# Patient Record
Sex: Female | Born: 1955
Health system: Southern US, Community
[De-identification: ages and names within clinical notes are randomized; demographics above are authoritative.]

## PROBLEM LIST (undated history)

## (undated) DIAGNOSIS — C73 Malignant neoplasm of thyroid gland: Secondary | ICD-10-CM

## (undated) DIAGNOSIS — I499 Cardiac arrhythmia, unspecified: Secondary | ICD-10-CM

## (undated) DIAGNOSIS — M40204 Unspecified kyphosis, thoracic region: Secondary | ICD-10-CM

## (undated) DIAGNOSIS — N289 Disorder of kidney and ureter, unspecified: Secondary | ICD-10-CM

## (undated) DIAGNOSIS — T7840XA Allergy, unspecified, initial encounter: Secondary | ICD-10-CM

## (undated) DIAGNOSIS — F341 Dysthymic disorder: Secondary | ICD-10-CM

## (undated) DIAGNOSIS — N1831 Chronic kidney disease, stage 3a: Secondary | ICD-10-CM

## (undated) DIAGNOSIS — R0600 Dyspnea, unspecified: Secondary | ICD-10-CM

## (undated) DIAGNOSIS — F32A Depression, unspecified: Secondary | ICD-10-CM

## (undated) DIAGNOSIS — R011 Cardiac murmur, unspecified: Secondary | ICD-10-CM

## (undated) DIAGNOSIS — J449 Chronic obstructive pulmonary disease, unspecified: Secondary | ICD-10-CM

## (undated) DIAGNOSIS — R112 Nausea with vomiting, unspecified: Secondary | ICD-10-CM

## (undated) DIAGNOSIS — E890 Postprocedural hypothyroidism: Secondary | ICD-10-CM

## (undated) DIAGNOSIS — I639 Cerebral infarction, unspecified: Secondary | ICD-10-CM

## (undated) DIAGNOSIS — M5135 Other intervertebral disc degeneration, thoracolumbar region: Secondary | ICD-10-CM

## (undated) DIAGNOSIS — J189 Pneumonia, unspecified organism: Secondary | ICD-10-CM

## (undated) DIAGNOSIS — D649 Anemia, unspecified: Secondary | ICD-10-CM

## (undated) DIAGNOSIS — R413 Other amnesia: Secondary | ICD-10-CM

## (undated) DIAGNOSIS — G4733 Obstructive sleep apnea (adult) (pediatric): Secondary | ICD-10-CM

## (undated) DIAGNOSIS — K219 Gastro-esophageal reflux disease without esophagitis: Secondary | ICD-10-CM

## (undated) DIAGNOSIS — I10 Essential (primary) hypertension: Secondary | ICD-10-CM

## (undated) DIAGNOSIS — E785 Hyperlipidemia, unspecified: Secondary | ICD-10-CM

## (undated) DIAGNOSIS — I480 Paroxysmal atrial fibrillation: Secondary | ICD-10-CM

## (undated) DIAGNOSIS — I700 Atherosclerosis of aorta: Secondary | ICD-10-CM

## (undated) DIAGNOSIS — R7303 Prediabetes: Secondary | ICD-10-CM

## (undated) DIAGNOSIS — G4730 Sleep apnea, unspecified: Secondary | ICD-10-CM

## (undated) DIAGNOSIS — F329 Major depressive disorder, single episode, unspecified: Secondary | ICD-10-CM

## (undated) DIAGNOSIS — Z9889 Other specified postprocedural states: Secondary | ICD-10-CM

## (undated) DIAGNOSIS — I4891 Unspecified atrial fibrillation: Secondary | ICD-10-CM

## (undated) DIAGNOSIS — G935 Compression of brain: Secondary | ICD-10-CM

## (undated) DIAGNOSIS — E209 Hypoparathyroidism, unspecified: Secondary | ICD-10-CM

## (undated) DIAGNOSIS — I2510 Atherosclerotic heart disease of native coronary artery without angina pectoris: Secondary | ICD-10-CM

## (undated) DIAGNOSIS — E079 Disorder of thyroid, unspecified: Secondary | ICD-10-CM

## (undated) DIAGNOSIS — Z7901 Long term (current) use of anticoagulants: Secondary | ICD-10-CM

## (undated) DIAGNOSIS — M8580 Other specified disorders of bone density and structure, unspecified site: Secondary | ICD-10-CM

## (undated) DIAGNOSIS — Z79899 Other long term (current) drug therapy: Secondary | ICD-10-CM

## (undated) DIAGNOSIS — Z9841 Cataract extraction status, right eye: Secondary | ICD-10-CM

## (undated) DIAGNOSIS — I6789 Other cerebrovascular disease: Secondary | ICD-10-CM

## (undated) DIAGNOSIS — I779 Disorder of arteries and arterioles, unspecified: Secondary | ICD-10-CM

## (undated) DIAGNOSIS — I5189 Other ill-defined heart diseases: Secondary | ICD-10-CM

## (undated) DIAGNOSIS — J45909 Unspecified asthma, uncomplicated: Secondary | ICD-10-CM

## (undated) DIAGNOSIS — Z9842 Cataract extraction status, left eye: Secondary | ICD-10-CM

## (undated) DIAGNOSIS — E039 Hypothyroidism, unspecified: Secondary | ICD-10-CM

## (undated) HISTORY — PX: OTHER SURGICAL HISTORY: SHX169

## (undated) HISTORY — DX: Hyperlipidemia, unspecified: E78.5

## (undated) HISTORY — DX: Cerebral infarction, unspecified: I63.9

## (undated) HISTORY — PX: JOINT REPLACEMENT: SHX530

## (undated) HISTORY — DX: Gastro-esophageal reflux disease without esophagitis: K21.9

## (undated) HISTORY — DX: Major depressive disorder, single episode, unspecified: F32.9

## (undated) HISTORY — PX: TONSILLECTOMY: SUR1361

## (undated) HISTORY — DX: Dysthymic disorder: F34.1

## (undated) HISTORY — PX: CATARACT EXTRACTION W/ INTRAOCULAR LENS  IMPLANT, BILATERAL: SHX1307

## (undated) HISTORY — DX: Disorder of thyroid, unspecified: E07.9

## (undated) HISTORY — DX: Hypoparathyroidism, unspecified: E20.9

## (undated) HISTORY — DX: Depression, unspecified: F32.A

## (undated) HISTORY — PX: THYROIDECTOMY: SHX17

## (undated) HISTORY — DX: Essential (primary) hypertension: I10

## (undated) HISTORY — DX: Allergy, unspecified, initial encounter: T78.40XA

## (undated) HISTORY — DX: Malignant neoplasm of thyroid gland: C73

## (undated) HISTORY — PX: CATARACT EXTRACTION, BILATERAL: SHX1313

---

## 1997-05-21 DIAGNOSIS — C73 Malignant neoplasm of thyroid gland: Secondary | ICD-10-CM

## 1997-05-21 HISTORY — DX: Malignant neoplasm of thyroid gland: C73

## 1998-05-21 HISTORY — PX: TOTAL THYROIDECTOMY: SHX2547

## 1999-05-22 DIAGNOSIS — I639 Cerebral infarction, unspecified: Secondary | ICD-10-CM

## 1999-05-22 HISTORY — DX: Cerebral infarction, unspecified: I63.9

## 1999-12-20 HISTORY — PX: OTHER SURGICAL HISTORY: SHX169

## 2000-01-19 ENCOUNTER — Encounter: Payer: Self-pay | Admitting: Family Medicine

## 2000-01-19 ENCOUNTER — Encounter: Admission: RE | Admit: 2000-01-19 | Discharge: 2000-01-19 | Payer: Self-pay | Admitting: Family Medicine

## 2000-03-21 DIAGNOSIS — I6381 Other cerebral infarction due to occlusion or stenosis of small artery: Secondary | ICD-10-CM

## 2000-03-21 HISTORY — DX: Other cerebral infarction due to occlusion or stenosis of small artery: I63.81

## 2001-09-09 ENCOUNTER — Other Ambulatory Visit: Admission: RE | Admit: 2001-09-09 | Discharge: 2001-09-09 | Payer: Self-pay | Admitting: Family Medicine

## 2002-11-18 ENCOUNTER — Encounter: Admission: RE | Admit: 2002-11-18 | Discharge: 2002-11-18 | Payer: Self-pay | Admitting: Family Medicine

## 2002-11-18 ENCOUNTER — Encounter: Payer: Self-pay | Admitting: Family Medicine

## 2004-01-04 ENCOUNTER — Other Ambulatory Visit: Payer: Self-pay

## 2004-06-14 ENCOUNTER — Ambulatory Visit: Payer: Self-pay | Admitting: Family Medicine

## 2004-06-16 ENCOUNTER — Ambulatory Visit: Payer: Self-pay | Admitting: Family Medicine

## 2004-07-14 ENCOUNTER — Ambulatory Visit: Payer: Self-pay | Admitting: Family Medicine

## 2004-08-15 ENCOUNTER — Ambulatory Visit: Payer: Self-pay | Admitting: Family Medicine

## 2004-09-06 ENCOUNTER — Ambulatory Visit: Payer: Self-pay | Admitting: Family Medicine

## 2005-07-25 ENCOUNTER — Ambulatory Visit: Payer: Self-pay | Admitting: Family Medicine

## 2005-08-13 ENCOUNTER — Ambulatory Visit: Payer: Self-pay | Admitting: Family Medicine

## 2005-09-11 ENCOUNTER — Ambulatory Visit: Payer: Self-pay | Admitting: Family Medicine

## 2005-09-14 ENCOUNTER — Ambulatory Visit: Payer: Self-pay | Admitting: Family Medicine

## 2005-10-18 ENCOUNTER — Ambulatory Visit: Payer: Self-pay | Admitting: Family Medicine

## 2006-01-16 ENCOUNTER — Ambulatory Visit: Payer: Self-pay | Admitting: Family Medicine

## 2006-02-11 ENCOUNTER — Ambulatory Visit: Payer: Self-pay | Admitting: Family Medicine

## 2006-05-24 ENCOUNTER — Ambulatory Visit: Payer: Self-pay | Admitting: Family Medicine

## 2006-08-12 DIAGNOSIS — R05 Cough: Secondary | ICD-10-CM

## 2006-08-12 DIAGNOSIS — Z8679 Personal history of other diseases of the circulatory system: Secondary | ICD-10-CM | POA: Insufficient documentation

## 2006-08-12 DIAGNOSIS — Z87898 Personal history of other specified conditions: Secondary | ICD-10-CM | POA: Insufficient documentation

## 2006-08-12 DIAGNOSIS — H189 Unspecified disorder of cornea: Secondary | ICD-10-CM | POA: Insufficient documentation

## 2006-08-12 DIAGNOSIS — J45909 Unspecified asthma, uncomplicated: Secondary | ICD-10-CM | POA: Insufficient documentation

## 2006-08-12 DIAGNOSIS — L719 Rosacea, unspecified: Secondary | ICD-10-CM | POA: Insufficient documentation

## 2006-08-12 DIAGNOSIS — R059 Cough, unspecified: Secondary | ICD-10-CM | POA: Insufficient documentation

## 2006-08-12 DIAGNOSIS — N951 Menopausal and female climacteric states: Secondary | ICD-10-CM | POA: Insufficient documentation

## 2006-08-12 DIAGNOSIS — E785 Hyperlipidemia, unspecified: Secondary | ICD-10-CM | POA: Insufficient documentation

## 2006-08-12 DIAGNOSIS — E209 Hypoparathyroidism, unspecified: Secondary | ICD-10-CM | POA: Insufficient documentation

## 2006-08-12 DIAGNOSIS — I10 Essential (primary) hypertension: Secondary | ICD-10-CM | POA: Insufficient documentation

## 2006-08-12 DIAGNOSIS — J309 Allergic rhinitis, unspecified: Secondary | ICD-10-CM | POA: Insufficient documentation

## 2006-09-16 ENCOUNTER — Emergency Department: Payer: Self-pay | Admitting: General Practice

## 2006-09-16 ENCOUNTER — Other Ambulatory Visit: Payer: Self-pay

## 2006-11-11 ENCOUNTER — Telehealth (INDEPENDENT_AMBULATORY_CARE_PROVIDER_SITE_OTHER): Payer: Self-pay | Admitting: *Deleted

## 2007-04-16 ENCOUNTER — Telehealth: Payer: Self-pay | Admitting: Family Medicine

## 2007-05-23 ENCOUNTER — Ambulatory Visit: Payer: Self-pay | Admitting: Family Medicine

## 2007-05-23 DIAGNOSIS — E039 Hypothyroidism, unspecified: Secondary | ICD-10-CM | POA: Insufficient documentation

## 2007-05-26 LAB — CONVERTED CEMR LAB
Albumin: 3.5 g/dL (ref 3.5–5.2)
BUN: 13 mg/dL (ref 6–23)
Basophils Absolute: 0 10*3/uL (ref 0.0–0.1)
Basophils Relative: 0.3 % (ref 0.0–1.0)
CO2: 31 meq/L (ref 19–32)
Calcium: 8.3 mg/dL — ABNORMAL LOW (ref 8.4–10.5)
Chloride: 108 meq/L (ref 96–112)
Cholesterol: 221 mg/dL (ref 0–200)
Creatinine, Ser: 0.9 mg/dL (ref 0.4–1.2)
Direct LDL: 84.1 mg/dL
Eosinophils Absolute: 0.1 10*3/uL (ref 0.0–0.6)
Eosinophils Relative: 2.1 % (ref 0.0–5.0)
GFR calc Af Amer: 85 mL/min
GFR calc non Af Amer: 70 mL/min
Glucose, Bld: 100 mg/dL — ABNORMAL HIGH (ref 70–99)
HCT: 36.6 % (ref 36.0–46.0)
HDL: 30.9 mg/dL — ABNORMAL LOW (ref >39.0)
Hemoglobin: 12.6 g/dL (ref 12.0–15.0)
Lymphocytes Relative: 24.4 % (ref 12.0–46.0)
MCHC: 34.4 g/dL (ref 30.0–36.0)
MCV: 84.1 fL (ref 78.0–100.0)
Monocytes Absolute: 0.4 10*3/uL (ref 0.2–0.7)
Monocytes Relative: 6.9 % (ref 3.0–11.0)
Neutro Abs: 4.3 10*3/uL (ref 1.4–7.7)
Neutrophils Relative %: 66.3 % (ref 43.0–77.0)
Phosphorus: 4.6 mg/dL (ref 2.3–4.6)
Platelets: 237 10*3/uL (ref 150–400)
Potassium: 4.1 meq/L (ref 3.5–5.1)
RBC: 4.35 M/uL (ref 3.87–5.11)
RDW: 13.9 % (ref 11.5–14.6)
Sodium: 144 meq/L (ref 135–145)
TSH: 0.06 microintl units/mL — ABNORMAL LOW (ref 0.35–5.50)
Total CHOL/HDL Ratio: 7.2
Triglycerides: 292 mg/dL (ref 0–149)
VLDL: 58 mg/dL — ABNORMAL HIGH (ref 0–40)
WBC: 6.4 10*3/uL (ref 4.5–10.5)

## 2007-07-28 ENCOUNTER — Ambulatory Visit: Payer: Self-pay | Admitting: Family Medicine

## 2007-07-30 ENCOUNTER — Telehealth: Payer: Self-pay | Admitting: Family Medicine

## 2007-08-01 DIAGNOSIS — A088 Other specified intestinal infections: Secondary | ICD-10-CM | POA: Insufficient documentation

## 2007-08-06 ENCOUNTER — Telehealth: Payer: Self-pay | Admitting: Family Medicine

## 2007-08-06 ENCOUNTER — Emergency Department: Payer: Self-pay | Admitting: Emergency Medicine

## 2007-09-01 ENCOUNTER — Encounter: Payer: Self-pay | Admitting: Family Medicine

## 2007-12-19 ENCOUNTER — Encounter: Payer: Self-pay | Admitting: Family Medicine

## 2008-02-17 ENCOUNTER — Ambulatory Visit: Payer: Self-pay | Admitting: Family Medicine

## 2008-07-20 ENCOUNTER — Encounter: Payer: Self-pay | Admitting: Family Medicine

## 2009-01-26 ENCOUNTER — Ambulatory Visit: Payer: Self-pay | Admitting: Family Medicine

## 2009-03-15 ENCOUNTER — Ambulatory Visit: Payer: Self-pay | Admitting: Family Medicine

## 2009-03-15 DIAGNOSIS — B029 Zoster without complications: Secondary | ICD-10-CM | POA: Insufficient documentation

## 2009-07-27 ENCOUNTER — Encounter: Payer: Self-pay | Admitting: Family Medicine

## 2009-12-16 ENCOUNTER — Telehealth (INDEPENDENT_AMBULATORY_CARE_PROVIDER_SITE_OTHER): Payer: Self-pay | Admitting: *Deleted

## 2009-12-19 ENCOUNTER — Ambulatory Visit: Payer: Self-pay | Admitting: Family Medicine

## 2009-12-23 ENCOUNTER — Ambulatory Visit: Payer: Self-pay | Admitting: Family Medicine

## 2009-12-23 DIAGNOSIS — R7309 Other abnormal glucose: Secondary | ICD-10-CM | POA: Insufficient documentation

## 2009-12-23 DIAGNOSIS — K219 Gastro-esophageal reflux disease without esophagitis: Secondary | ICD-10-CM | POA: Insufficient documentation

## 2009-12-27 LAB — CONVERTED CEMR LAB
ALT: 23 units/L (ref 0–35)
AST: 20 units/L (ref 0–37)
Cholesterol: 255 mg/dL — ABNORMAL HIGH (ref 0–200)
Direct LDL: 101.1 mg/dL
HDL: 37.6 mg/dL — ABNORMAL LOW (ref >39.00)
Hgb A1c MFr Bld: 5.7 % (ref 4.6–6.5)
Total CHOL/HDL Ratio: 7
Triglycerides: 485 mg/dL — ABNORMAL HIGH (ref 0.0–149.0)
VLDL: 97 mg/dL — ABNORMAL HIGH (ref 0.0–40.0)

## 2009-12-29 ENCOUNTER — Encounter: Payer: Self-pay | Admitting: Family Medicine

## 2010-01-05 ENCOUNTER — Encounter: Payer: Self-pay | Admitting: Family Medicine

## 2010-06-18 LAB — CONVERTED CEMR LAB
ALT: 22 units/L (ref 0–35)
AST: 19 units/L (ref 0–37)
Albumin: 3.9 g/dL (ref 3.5–5.2)
Alkaline Phosphatase: 80 units/L (ref 39–117)
BUN: 14 mg/dL (ref 6–23)
Basophils Absolute: 0 10*3/uL (ref 0.0–0.1)
Basophils Relative: 0.7 % (ref 0.0–3.0)
Bilirubin, Direct: 0.1 mg/dL (ref 0.0–0.3)
CO2: 32 meq/L (ref 19–32)
Calcium, Total (PTH): 8.3 mg/dL — ABNORMAL LOW (ref 8.4–10.5)
Calcium: 8 mg/dL — ABNORMAL LOW (ref 8.4–10.5)
Chloride: 106 meq/L (ref 96–112)
Creatinine, Ser: 0.9 mg/dL (ref 0.4–1.2)
Eosinophils Absolute: 0.1 10*3/uL (ref 0.0–0.7)
Eosinophils Relative: 2 % (ref 0.0–5.0)
GFR calc non Af Amer: 71.17 mL/min (ref >60)
Glucose, Bld: 115 mg/dL — ABNORMAL HIGH (ref 70–99)
HCT: 36.5 % (ref 36.0–46.0)
Hemoglobin: 12.2 g/dL (ref 12.0–15.0)
Lymphocytes Relative: 22 % (ref 12.0–46.0)
Lymphs Abs: 1.4 10*3/uL (ref 0.7–4.0)
MCHC: 33.3 g/dL (ref 30.0–36.0)
MCV: 85.1 fL (ref 78.0–100.0)
Monocytes Absolute: 0.4 10*3/uL (ref 0.1–1.0)
Monocytes Relative: 5.9 % (ref 3.0–12.0)
Neutro Abs: 4.4 10*3/uL (ref 1.4–7.7)
Neutrophils Relative %: 69.4 % (ref 43.0–77.0)
PTH: 28.5 pg/mL (ref 14.0–72.0)
Platelets: 257 10*3/uL (ref 150.0–400.0)
Potassium: 3.6 meq/L (ref 3.5–5.1)
RBC: 4.29 M/uL (ref 3.87–5.11)
RDW: 15 % — ABNORMAL HIGH (ref 11.5–14.6)
Sodium: 143 meq/L (ref 135–145)
TSH: 5.03 microintl units/mL (ref 0.35–5.50)
Total Bilirubin: 0.7 mg/dL (ref 0.3–1.2)
Total Protein: 7 g/dL (ref 6.0–8.3)
Vit D, 25-Hydroxy: 34 ng/mL (ref 30–89)
WBC: 6.3 10*3/uL (ref 4.5–10.5)

## 2010-06-20 NOTE — Letter (Signed)
Summary: [facility] Endocrinology  [facility] Endocrinology   Imported By: Edmonia James 08/02/2009 08:35:36  _____________________________________________________________________  External Attachment:    Type:   Image     Comment:   External Document

## 2010-06-20 NOTE — Assessment & Plan Note (Signed)
Summary: CPX/CLE   Vital Signs:  Patient profile:   55 year old female Height:      63 inches Weight:      164.75 pounds BMI:     29.29 Temp:     98 degrees F oral Pulse rate:   76 / minute Pulse rhythm:   regular BP sitting:   104 / 70  (left arm) Cuff size:   regular  Vitals Entered By: Ozzie Hoyle LPN (August  5, 624THL 10:09 AM) CC: CPX LMP 2003   History of Present Illness: here for health mt exam  has been doing well  has vacation in september    wt is up 1 lb  bp great 104/70= stable HTN   lipids Last Lipid ProfileCholesterol: 221 (05/23/2007 9:14:00 AM)HDL:  30.9 (05/23/2007 9:14:00 AM)LDL:  DEL (05/23/2007 9:14:00 AM)Triglycerides:  Last Liver profileSGOT:  19 (12/19/2009 9:54:24 AM)SPGT:  22 (12/19/2009 9:54:24 AM)T. Bili:  0.7 (12/19/2009 9:54:24 AM)Alk Phos:  80 (12/19/2009 9:54:24 AM)  due for chol check  past thyroid ca - endo visit in march stable  no big changes  will get a scan next years   hypoparathyroid also folowed  PTH nl and ca 8.3 vit D 34 dexa 07 normal   glucose 115 has fam hx of DM  is eating a little better  need to start exercising -- has a track to walk around     Td99  gyn--tried pap once and too painful  has never been sexuallly active no symptoms  mam --- went to [facility] imaging  no lumps on self exam    has heartburn if she eats late at night or eats certain foods - spicy  gets a frequent cough      Allergies: 1)  ! Aggrenox (Aspirin-Dipyridamole) 2)  ! Crestor (Rosuvastatin Calcium)  Past History:  Past Surgical History: Last updated: 08/12/2006 Appendectomy Thyroidectomy Tonsillectomy MRI- brain (12/1999) Dexa- normal (09/2001,12/2005)  Family History: Last updated: 12/23/2009 no fam hx of neuro dz (was an error) father DM  Paunt and P uncle DM   Social History: Last updated: 05/23/2007 Never Smoked works at [employer] facility  Risk Factors: Smoking Status: never (01/26/2009)  Past  Medical History: Allergic rhinitis Hyperlipidemia Hypertension Cerebrovascular accident, hx of- visual deficit papillary thyroid cancer(1999) GERD hypoparathyroid hypothyroid cannot tolerate pap / virginal  Family History: no fam hx of neuro dz (was an error) father DM  Paunt and P uncle DM   Review of Systems General:  Denies fatigue, loss of appetite, and malaise. Eyes:  Denies blurring and eye pain. ENT:  Complains of nasal congestion; allergies/ nasal congestion occas. CV:  Denies chest pain or discomfort, palpitations, shortness of breath with exertion, and swelling of feet. Resp:  Denies cough, shortness of breath, and wheezing. GI:  Denies abdominal pain, change in bowel habits, indigestion, nausea, and vomiting. GU:  Denies abnormal vaginal bleeding, discharge, dysuria, and urinary frequency. MS:  Denies muscle aches and cramps. Derm:  Denies itching, lesion(s), poor wound healing, and rash. Neuro:  Denies numbness and tingling. Psych:  mood is generally good . Endo:  Denies cold intolerance, excessive thirst, excessive urination, and heat intolerance. Heme:  Denies abnormal bruising and bleeding.  Physical Exam  General:  overweight but generally well appearing  Head:  normocephalic, atraumatic, and no abnormalities observed.   Eyes:  vision grossly intact, pupils equal, pupils round, and pupils reactive to light.  no conjunctival pallor, injection or icterus  Ears:  R ear  normal and L ear normal.   Nose:  no nasal discharge.   Mouth:  pharynx pink and moist.   Neck:  supple with full rom and no masses or thyromegally, no JVD or carotid bruit  Chest Wall:  No deformities, masses, or tenderness noted. Breasts:  No mass, nodules, thickening, tenderness, bulging, retraction, inflamation, nipple discharge or skin changes noted.   Lungs:  Normal respiratory effort, chest expands symmetrically. Lungs are clear to auscultation, no crackles or wheezes. Heart:  Normal rate  and regular rhythm. S1 and S2 normal without gallop, murmur, click, rub or other extra sounds. Abdomen:  Bowel sounds positive,abdomen soft and non-tender without masses, organomegaly or hernias noted. no renal bruits  Msk:  No deformity or scoliosis noted of thoracic or lumbar spine.  no acute joint changes  Pulses:  R and L carotid,radial,femoral,dorsalis pedis and posterior tibial pulses are full and equal bilaterally Extremities:  No clubbing, cyanosis, edema, or deformity noted with normal full range of motion of all joints.   Neurologic:  sensation intact to light touch, gait normal, and DTRs symmetrical and normal.   Skin:  Intact without suspicious lesions or rashes Cervical Nodes:  No lymphadenopathy noted Axillary Nodes:  No palpable lymphadenopathy Inguinal Nodes:  No significant adenopathy Psych:  normal affect, talkative and pleasant    Impression & Recommendations:  Problem # 1:  HEALTH MAINTENANCE EXAM (ICD-V70.0) Assessment Comment Only reviewed health habits including diet, exercise and skin cancer prevention reviewed health maintenance list and family history  disc plan for exercise and wt loss  Problem # 2:  HYPERGLYCEMIA (ICD-790.29) Assessment: New given handout on diet from aafp rev low simple sugar diet  AIC toda Orders: TLB-Lipid Panel (80061-LIPID) TLB-ALT (SGPT) (84460-ALT) TLB-AST (SGOT) (84450-SGOT) TLB-A1C / Hgb A1C (Glycohemoglobin) (83036-A1C)  Problem # 3:  HYPOTHYROIDISM (ICD-244.9) Assessment: Unchanged  stable - followed by endo Her updated medication list for this problem includes:    Synthroid 175 Mcg Tabs (Levothyroxine sodium) .Marland Kitchen... Take one by mouth daily  Orders: Radiology Referral (Radiology)  Problem # 4:  HYPERTENSION (ICD-401.9) Assessment: Unchanged  in good control with atenolol  Her updated medication list for this problem includes:    Atenolol 50 Mg Tabs (Atenolol) .Marland Kitchen... 1 by mouth every day  BP today: 104/70 Prior BP:  122/74 (03/15/2009)  Labs Reviewed: K+: 3.6 (12/19/2009) Creat: : 0.9 (12/19/2009)   Chol: 221 (05/23/2007)   HDL: 30.9 (05/23/2007)   LDL: DEL (05/23/2007)   TG: 292 (05/23/2007)  Problem # 5:  HYPERLIPIDEMIA (ICD-272.4) Assessment: Unchanged  lab today on crestor  rev low sat fat diet  Her updated medication list for this problem includes:    Crestor 20 Mg Tabs (Rosuvastatin calcium) .Marland Kitchen... 1 by mouth every day  Orders: TLB-Lipid Panel (80061-LIPID) TLB-ALT (SGPT) (84460-ALT) TLB-AST (SGOT) (84450-SGOT) TLB-A1C / Hgb A1C (Glycohemoglobin) (83036-A1C)  Labs Reviewed: SGOT: 19 (12/19/2009)   SGPT: 22 (12/19/2009)   HDL:30.9 (05/23/2007)  LDL:DEL (05/23/2007)  Chol:221 (05/23/2007)  Trig:292 (05/23/2007)  Problem # 6:  GERD (ICD-530.81) Assessment: Deteriorated with cough trial of zantac and update disc wt loss and lifestyle change Her updated medication list for this problem includes:    Zantac 150 Mg Tabs (Ranitidine hcl) .Marland Kitchen... 1 by mouth two times a day  Complete Medication List: 1)  Atenolol 50 Mg Tabs (Atenolol) .Marland Kitchen.. 1 by mouth every day 2)  Crestor 20 Mg Tabs (Rosuvastatin calcium) .Marland Kitchen.. 1 by mouth every day 3)  Synthroid 175 Mcg Tabs (Levothyroxine sodium) .... Take  one by mouth daily 4)  Plavix 75 Mg Tabs (Clopidogrel bisulfate) .Marland Kitchen.. 1 by mouth once daily 5)  Nasonex 50 Mcg/act Susp (Mometasone furoate) .... 2 sprays each nostril once daily as needed 6)  Calcitriol 0.25 Mcg Caps (Calcitriol) .... Take two capsules every  morning and one capsule every evening. 7)  Zantac 150 Mg Tabs (Ranitidine hcl) .Marland Kitchen.. 1 by mouth two times a day  Other Orders: TD Toxoids IM 7 YR + [id number]) Admin 1st Vaccine [phone])  Patient Instructions: 1)  your sugar is slightly high  2)  doing more labs for this and chol  3)  stop sweets and any sweet drinks with sugar  4)  (avoid soft drinks (diet is ok ) -- and sweeten tea with artificial sweetner) 5)  best to drink water  6)  for carbs -  stay with brown items -- brown bread/ pasta/ sweet potato / high grain  7)  in general work on weight loss  8)  please send to [facility] for last mammogram and dexa -- ? last year 9)  try daily claritin for allergy symptoms if you want to over the counter  10)  start zantac (ranitidine ) 150 mg two times a day for acid reflux and cough-- let me know if this does not help Prescriptions: ZANTAC 150 MG TABS (RANITIDINE HCL) 1 by mouth two times a day  #60 x 11   Entered and Authorized by:   Allena Earing MD   Signed by:   Allena Earing MD on 12/23/2009   Method used:   Electronically to        [facility]. [address] [phone]* (retail)       [place], [place]  [place]:[id number]       Ph: [phone]       Fax: [phone]   RxID:   [id number] PLAVIX 75 MG  TABS (CLOPIDOGREL BISULFATE) 1 by mouth once daily  #30 x 11   Entered and Authorized by:   Allena Earing MD   Signed by:   Allena Earing MD on 12/23/2009   Method used:   Electronically to        [facility]. [address] [phone]* (retail)       [place], [place]  [id number]       Ph: [phone]       Fax: [phone]   RxID:   [id number] CRESTOR 20 MG  TABS (ROSUVASTATIN CALCIUM) 1 by mouth every day  #30 x 11   Entered and Authorized by:   Allena Earing MD   Signed by:   Allena Earing MD on 12/23/2009   Method used:   Electronically to        [facility]. [address] [phone]* (retail)       [place], [place]  [place]:[id number]       Ph: [phone]       Fax: [phone]   RxID:   [id number] ATENOLOL 50 MG TABS (ATENOLOL) 1 by mouth every day  #30 x 11   Entered and Authorized by:   Allena Earing MD   Signed by:   Allena Earing MD on 12/23/2009   Method used:   Electronically to        [facility]. [facility] [phone]* (retail)  [address] [place], [place]  [id number]       Ph: [phone]       Fax: [phone]   [id number]   Current Allergies (reviewed today): ! AGGRENOX (ASPIRIN-DIPYRIDAMOLE) ! CRESTOR (ROSUVASTATIN CALCIUM)    Immunizations Administered:  Tetanus Vaccine:    Vaccine Type: Td    Site: right deltoid    Mfr: Sanofi Pasteur    Dose: 0.5 ml    Route: IM    Given by: Ozzie Hoyle LPN    Exp. Date: 06/22/2011    Lot #: [id number]    VIS given: 04/08/07 version given December 23, 2009.

## 2010-06-20 NOTE — Progress Notes (Signed)
----   Converted from flag ---- ---- 12/15/2009 8:52 PM, Carmell Austria Tower MD wrote: please check wellness/ lipid/ vit D/ ca/ phos for 272, v70.0 and hypoparathyroidism- thanks  ---- 12/15/2009 11:15 AM, Daralene Milch CMA (AAMA) wrote: Pt is scheduled for cpx labs monday, what labs to draw and dx codes? Thanks Tasha ------------------------------

## 2011-02-05 ENCOUNTER — Ambulatory Visit: Payer: Self-pay | Admitting: "Endocrinology

## 2011-02-09 ENCOUNTER — Ambulatory Visit: Payer: Self-pay | Admitting: "Endocrinology

## 2011-02-19 ENCOUNTER — Ambulatory Visit: Payer: Self-pay | Admitting: "Endocrinology

## 2011-03-22 ENCOUNTER — Ambulatory Visit: Payer: Self-pay | Admitting: "Endocrinology

## 2011-03-22 ENCOUNTER — Ambulatory Visit: Payer: Self-pay | Admitting: Ophthalmology

## 2011-03-22 DIAGNOSIS — I119 Hypertensive heart disease without heart failure: Secondary | ICD-10-CM

## 2011-04-04 ENCOUNTER — Ambulatory Visit: Payer: Self-pay | Admitting: Ophthalmology

## 2011-04-23 ENCOUNTER — Ambulatory Visit (INDEPENDENT_AMBULATORY_CARE_PROVIDER_SITE_OTHER): Payer: Self-pay | Admitting: Family Medicine

## 2011-04-23 ENCOUNTER — Encounter: Payer: Self-pay | Admitting: Family Medicine

## 2011-04-23 DIAGNOSIS — R7309 Other abnormal glucose: Secondary | ICD-10-CM

## 2011-04-26 NOTE — Progress Notes (Signed)
  Subjective:    Patient ID: Brenda Barber, female    DOB: September 10, 1955, 55 y.o.   MRN: [id number]  HPI ? No show   Review of Systems     Objective:   Physical Exam        Assessment & Plan:

## 2011-05-02 ENCOUNTER — Ambulatory Visit: Payer: Self-pay | Admitting: Ophthalmology

## 2011-05-08 ENCOUNTER — Encounter: Payer: Self-pay | Admitting: Family Medicine

## 2011-05-08 ENCOUNTER — Ambulatory Visit (INDEPENDENT_AMBULATORY_CARE_PROVIDER_SITE_OTHER): Payer: Self-pay | Admitting: Family Medicine

## 2011-05-08 DIAGNOSIS — F341 Dysthymic disorder: Secondary | ICD-10-CM

## 2011-05-08 NOTE — Patient Instructions (Signed)
FMLA form filled out for placement today  Follow up with your psychiatrist about disability  [facility] until you get insurance of some sort ( overdue for labs and visit) Please get a flu shot asap
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
## 2011-05-10 ENCOUNTER — Encounter: Payer: Self-pay | Admitting: Family Medicine

## 2011-07-09 ENCOUNTER — Other Ambulatory Visit: Payer: Self-pay | Admitting: Family Medicine

## 2011-07-09 ENCOUNTER — Other Ambulatory Visit: Payer: Self-pay | Admitting: *Deleted

## 2011-07-09 NOTE — Telephone Encounter (Signed)
Patient called requesting a refill on her Atenolol which has expired. Patient states that her BP was 159/100 Saturday and a blood vessel bursted in her eye. Patient states that she has an appointment with her eye doctor Friday. Patient states that her BP today is 129/89. Patient states [facility] because she does not have a job and insurance at this time. Patient states that she will be coming back to see you when she gets a job and gets her benefits back and states that you are aware of this. Patients wants to know if you will refill her Atenolol until she can get back in to see you? Pharmacy/[facility]

## 2011-07-09 NOTE — Telephone Encounter (Signed)
Thanks - I will see her when she is able to return here next

## 2011-07-09 NOTE — Telephone Encounter (Signed)
Are the [facility] taking care [facility] her HTN ? If so, they should refil until she comes back to me Let me know -thanks

## 2011-07-09 NOTE — Telephone Encounter (Signed)
Spoke with patient.  She is under the care of [facility] for [facility] and they have refilled her meds.  [facility] told her if she needed a refill to call her pharmacy.  I explained she needed to contact the pharmacy and the pharmacy will contact the [facility] for refill.

## 2012-01-10 ENCOUNTER — Inpatient Hospital Stay: Payer: Self-pay | Admitting: Student

## 2012-01-10 LAB — CBC WITH DIFFERENTIAL/PLATELET
Basophil %: 0.6 %
Eosinophil %: 1.1 %
HCT: 34.2 % — ABNORMAL LOW (ref 35.0–47.0)
HGB: 11.3 g/dL — ABNORMAL LOW (ref 12.0–16.0)
Lymphocyte %: 8 %
Monocyte %: 6 %
Neutrophil %: 84.3 %
RBC: 3.91 10*6/uL (ref 3.80–5.20)

## 2012-01-10 LAB — RAPID INFLUENZA A&B ANTIGENS

## 2012-01-10 LAB — BASIC METABOLIC PANEL
Anion Gap: 9 (ref 7–16)
Co2: 29 mmol/L (ref 21–32)
EGFR (African American): >60
Glucose: 152 mg/dL — ABNORMAL HIGH (ref 65–99)

## 2012-01-10 LAB — PRO B NATRIURETIC PEPTIDE: B-Type Natriuretic Peptide: 345 pg/mL — ABNORMAL HIGH (ref 0–125)

## 2012-01-10 LAB — CK TOTAL AND CKMB (NOT AT ARMC)
CK, Total: 154 U/L (ref 21–215)
CK-MB: 0.5 ng/mL (ref 0.5–3.6)

## 2012-01-10 LAB — TROPONIN I: Troponin-I: <0.02 ng/mL

## 2012-01-11 LAB — BASIC METABOLIC PANEL
Anion Gap: 13 (ref 7–16)
BUN: 36 mg/dL — ABNORMAL HIGH (ref 7–18)
Calcium, Total: 6.9 mg/dL — CL (ref 8.5–10.1)
Co2: 24 mmol/L (ref 21–32)
EGFR (African American): 42 — ABNORMAL LOW
EGFR (Non-African Amer.): 36 — ABNORMAL LOW
Glucose: 356 mg/dL — ABNORMAL HIGH (ref 65–99)
Osmolality: 291 (ref 275–301)
Sodium: 134 mmol/L — ABNORMAL LOW (ref 136–145)

## 2012-01-11 LAB — CBC WITH DIFFERENTIAL/PLATELET
Basophil #: 0.1 10*3/uL (ref 0.0–0.1)
Eosinophil #: 0 10*3/uL (ref 0.0–0.7)
Eosinophil %: 0 %
Lymphocyte %: 4.7 %
Monocyte #: 0.3 x10 3/mm (ref 0.2–0.9)
Monocyte %: 1.5 %
Neutrophil %: 93.4 %
Platelet: 223 10*3/uL (ref 150–440)
RBC: 3.78 10*6/uL — ABNORMAL LOW (ref 3.80–5.20)
RDW: 16.4 % — ABNORMAL HIGH (ref 11.5–14.5)
WBC: 19.1 10*3/uL — ABNORMAL HIGH (ref 3.6–11.0)

## 2012-01-11 LAB — LIPID PANEL
HDL Cholesterol: 42 mg/dL (ref 40–60)
Triglycerides: 123 mg/dL (ref 0–200)

## 2012-01-11 LAB — MAGNESIUM: Magnesium: 2.1 mg/dL

## 2012-01-11 LAB — HEMOGLOBIN A1C: Hemoglobin A1C: 6.7 % — ABNORMAL HIGH (ref 4.2–6.3)

## 2012-01-12 LAB — CBC WITH DIFFERENTIAL/PLATELET
Basophil #: 0 10*3/uL (ref 0.0–0.1)
Basophil %: 0.1 %
HCT: 31.3 % — ABNORMAL LOW (ref 35.0–47.0)
HGB: 9.6 g/dL — ABNORMAL LOW (ref 12.0–16.0)
Lymphocyte #: 1 10*3/uL (ref 1.0–3.6)
Lymphocyte %: 5.4 %
MCHC: 30.6 g/dL — ABNORMAL LOW (ref 32.0–36.0)
MCV: 88 fL (ref 80–100)
Monocyte #: 0.6 x10 3/mm (ref 0.2–0.9)
Monocyte %: 3.3 %
Neutrophil #: 17.1 10*3/uL — ABNORMAL HIGH (ref 1.4–6.5)
Platelet: 235 10*3/uL (ref 150–440)
WBC: 18.7 10*3/uL — ABNORMAL HIGH (ref 3.6–11.0)

## 2012-01-12 LAB — IRON AND TIBC: Iron: 108 ug/dL (ref 50–170)

## 2012-01-12 LAB — BASIC METABOLIC PANEL
Anion Gap: 11 (ref 7–16)
Calcium, Total: 7.1 mg/dL — ABNORMAL LOW (ref 8.5–10.1)
Chloride: 107 mmol/L (ref 98–107)
Co2: 26 mmol/L (ref 21–32)
EGFR (African American): 42 — ABNORMAL LOW
EGFR (Non-African Amer.): 36 — ABNORMAL LOW

## 2012-01-13 LAB — BASIC METABOLIC PANEL
Anion Gap: 10 (ref 7–16)
BUN: 21 mg/dL — ABNORMAL HIGH (ref 7–18)
Calcium, Total: 7.1 mg/dL — ABNORMAL LOW (ref 8.5–10.1)
Chloride: 106 mmol/L (ref 98–107)
Co2: 27 mmol/L (ref 21–32)
Creatinine: 1.19 mg/dL (ref 0.60–1.30)
EGFR (African American): 59 — ABNORMAL LOW
Osmolality: 294 (ref 275–301)
Potassium: 3.3 mmol/L — ABNORMAL LOW (ref 3.5–5.1)
Sodium: 143 mmol/L (ref 136–145)

## 2012-01-13 LAB — CBC WITH DIFFERENTIAL/PLATELET
Bands: 6 %
Lymphocytes: 12 %
MCHC: 32.5 g/dL (ref 32.0–36.0)
MCV: 88 fL (ref 80–100)
Monocytes: 4 %
Myelocyte: 5 %
Platelet: 237 10*3/uL (ref 150–440)
RBC: 3.67 10*6/uL — ABNORMAL LOW (ref 3.80–5.20)
Segmented Neutrophils: 71 %
WBC: 14.9 10*3/uL — ABNORMAL HIGH (ref 3.6–11.0)

## 2012-01-13 LAB — EXPECTORATED SPUTUM ASSESSMENT W GRAM STAIN, RFLX TO RESP C

## 2012-01-15 ENCOUNTER — Inpatient Hospital Stay: Payer: Self-pay | Admitting: Internal Medicine

## 2012-01-15 LAB — CBC
HCT: 36.2 % (ref 35.0–47.0)
HGB: 11.9 g/dL — ABNORMAL LOW (ref 12.0–16.0)
MCH: 28.7 pg (ref 26.0–34.0)
MCHC: 33 g/dL (ref 32.0–36.0)
MCV: 87 fL (ref 80–100)
RBC: 4.16 10*6/uL (ref 3.80–5.20)

## 2012-01-15 LAB — COMPREHENSIVE METABOLIC PANEL
Anion Gap: 12 (ref 7–16)
BUN: 29 mg/dL — ABNORMAL HIGH (ref 7–18)
Chloride: 107 mmol/L (ref 98–107)
Creatinine: 1.71 mg/dL — ABNORMAL HIGH (ref 0.60–1.30)
EGFR (African American): 38 — ABNORMAL LOW
EGFR (Non-African Amer.): 33 — ABNORMAL LOW
Osmolality: 298 (ref 275–301)
SGOT(AST): 54 U/L — ABNORMAL HIGH (ref 15–37)
SGPT (ALT): 105 U/L — ABNORMAL HIGH (ref 12–78)
Sodium: 144 mmol/L (ref 136–145)
Total Protein: 6.9 g/dL (ref 6.4–8.2)

## 2012-01-15 LAB — CULTURE, BLOOD (SINGLE)

## 2012-01-15 LAB — DRUG SCREEN, URINE
Amphetamines, Ur Screen: NEGATIVE (ref ?–1000)
Benzodiazepine, Ur Scrn: NEGATIVE (ref ?–200)
Cannabinoid 50 Ng, Ur ~~LOC~~: NEGATIVE (ref ?–50)
Opiate, Ur Screen: NEGATIVE (ref ?–300)
Phencyclidine (PCP) Ur S: NEGATIVE (ref ?–25)

## 2012-01-15 LAB — TROPONIN I
Troponin-I: <0.02 ng/mL
Troponin-I: 0.04 ng/mL
Troponin-I: <0.02 ng/mL

## 2012-01-15 LAB — URINALYSIS, COMPLETE
Bacteria: NONE SEEN
Leukocyte Esterase: NEGATIVE
Nitrite: NEGATIVE
Ph: 5 (ref 4.5–8.0)
Protein: NEGATIVE
Specific Gravity: 1.013 (ref 1.003–1.030)

## 2012-01-15 LAB — CK TOTAL AND CKMB (NOT AT ARMC)
CK, Total: 122 U/L (ref 21–215)
CK-MB: 2.5 ng/mL (ref 0.5–3.6)
CK-MB: 1.9 ng/mL (ref 0.5–3.6)
CK-MB: 1.2 ng/mL (ref 0.5–3.6)

## 2012-01-15 LAB — PROTIME-INR: INR: 1.1

## 2012-01-16 LAB — CBC WITH DIFFERENTIAL/PLATELET
Basophil #: 0.1 10*3/uL (ref 0.0–0.1)
Eosinophil %: 4 %
HCT: 34.2 % — ABNORMAL LOW (ref 35.0–47.0)
Lymphocyte #: 1.2 10*3/uL (ref 1.0–3.6)
MCV: 89 fL (ref 80–100)
Monocyte %: 6.8 %
Neutrophil #: 11.1 10*3/uL — ABNORMAL HIGH (ref 1.4–6.5)
Neutrophil %: 80 %
RBC: 3.83 10*6/uL (ref 3.80–5.20)
RDW: 16.7 % — ABNORMAL HIGH (ref 11.5–14.5)
WBC: 13.8 10*3/uL — ABNORMAL HIGH (ref 3.6–11.0)

## 2012-01-16 LAB — COMPREHENSIVE METABOLIC PANEL
Albumin: 2.9 g/dL — ABNORMAL LOW (ref 3.4–5.0)
Alkaline Phosphatase: 71 U/L (ref 50–136)
Anion Gap: 13 (ref 7–16)
BUN: 16 mg/dL (ref 7–18)
Bilirubin,Total: 0.4 mg/dL (ref 0.2–1.0)
Calcium, Total: 6.8 mg/dL — CL (ref 8.5–10.1)
Chloride: 103 mmol/L (ref 98–107)
Creatinine: 1.23 mg/dL (ref 0.60–1.30)
EGFR (African American): 57 — ABNORMAL LOW
EGFR (Non-African Amer.): 49 — ABNORMAL LOW
Potassium: 3.6 mmol/L (ref 3.5–5.1)
SGOT(AST): 29 U/L (ref 15–37)

## 2012-02-04 ENCOUNTER — Emergency Department: Payer: Self-pay | Admitting: Emergency Medicine

## 2012-02-04 LAB — COMPREHENSIVE METABOLIC PANEL
BUN: 11 mg/dL (ref 7–18)
Bilirubin,Total: 0.7 mg/dL (ref 0.2–1.0)
Chloride: 106 mmol/L (ref 98–107)
EGFR (African American): 49 — ABNORMAL LOW
Glucose: 106 mg/dL — ABNORMAL HIGH (ref 65–99)
Osmolality: 287 (ref 275–301)
Potassium: 3.3 mmol/L — ABNORMAL LOW (ref 3.5–5.1)
SGOT(AST): 48 U/L — ABNORMAL HIGH (ref 15–37)
SGPT (ALT): 81 U/L — ABNORMAL HIGH (ref 12–78)
Sodium: 144 mmol/L (ref 136–145)
Total Protein: 7.1 g/dL (ref 6.4–8.2)

## 2012-02-04 LAB — URINALYSIS, COMPLETE
Bilirubin,UR: NEGATIVE
Blood: NEGATIVE
Ketone: NEGATIVE
Leukocyte Esterase: NEGATIVE
Ph: 6 (ref 4.5–8.0)
RBC,UR: <1 /HPF (ref 0–5)
Specific Gravity: 1.012 (ref 1.003–1.030)
Squamous Epithelial: NONE SEEN

## 2012-02-04 LAB — CBC WITH DIFFERENTIAL/PLATELET
Basophil #: 0.1 10*3/uL (ref 0.0–0.1)
Basophil %: 1.1 %
Eosinophil #: 0.3 10*3/uL (ref 0.0–0.7)
HCT: 35 % (ref 35.0–47.0)
Lymphocyte #: 0.7 10*3/uL — ABNORMAL LOW (ref 1.0–3.6)
MCH: 30.1 pg (ref 26.0–34.0)
MCHC: 34.2 g/dL (ref 32.0–36.0)
MCV: 88 fL (ref 80–100)
Monocyte %: 6.2 %
Neutrophil #: 4.4 10*3/uL (ref 1.4–6.5)
Platelet: 218 10*3/uL (ref 150–440)
RBC: 3.98 10*6/uL (ref 3.80–5.20)
RDW: 16 % — ABNORMAL HIGH (ref 11.5–14.5)
WBC: 5.9 10*3/uL (ref 3.6–11.0)

## 2012-02-20 ENCOUNTER — Emergency Department: Payer: Self-pay | Admitting: Emergency Medicine

## 2013-01-05 ENCOUNTER — Ambulatory Visit: Payer: Self-pay | Admitting: Gastroenterology

## 2013-01-05 HISTORY — PX: COLONOSCOPY WITH ESOPHAGOGASTRODUODENOSCOPY (EGD): SHX5779

## 2013-01-06 LAB — PATHOLOGY REPORT

## 2013-02-16 ENCOUNTER — Ambulatory Visit: Payer: Self-pay | Admitting: Family Medicine

## 2013-06-22 ENCOUNTER — Ambulatory Visit: Payer: Self-pay | Admitting: Family Medicine

## 2013-09-03 LAB — URINALYSIS, COMPLETE
BACTERIA: NONE SEEN
Bilirubin,UR: NEGATIVE
Blood: NEGATIVE
Ketone: NEGATIVE
NITRITE: NEGATIVE
Ph: 6 (ref 4.5–8.0)
Specific Gravity: 1.02 (ref 1.003–1.030)
Squamous Epithelial: 1
WBC UR: 5 /HPF (ref 0–5)

## 2013-09-03 LAB — COMPREHENSIVE METABOLIC PANEL
ALBUMIN: 3.6 g/dL (ref 3.4–5.0)
ANION GAP: 7 (ref 7–16)
Alkaline Phosphatase: 103 U/L
BILIRUBIN TOTAL: 0.5 mg/dL (ref 0.2–1.0)
BUN: 22 mg/dL — AB (ref 7–18)
CALCIUM: 7.7 mg/dL — AB (ref 8.5–10.1)
Chloride: 104 mmol/L (ref 98–107)
Co2: 28 mmol/L (ref 21–32)
Creatinine: 0.96 mg/dL (ref 0.60–1.30)
EGFR (African American): >60
EGFR (Non-African Amer.): >60
Glucose: 123 mg/dL — ABNORMAL HIGH (ref 65–99)
OSMOLALITY: 282 (ref 275–301)
Potassium: 3.7 mmol/L (ref 3.5–5.1)
SGOT(AST): 19 U/L (ref 15–37)
SGPT (ALT): 22 U/L (ref 12–78)
Sodium: 139 mmol/L (ref 136–145)
TOTAL PROTEIN: 7.4 g/dL (ref 6.4–8.2)

## 2013-09-03 LAB — CBC WITH DIFFERENTIAL/PLATELET
BASOS ABS: 0.1 10*3/uL (ref 0.0–0.1)
Basophil %: 0.4 %
Eosinophil #: 0.2 10*3/uL (ref 0.0–0.7)
Eosinophil %: 1.6 %
HCT: 37.9 % (ref 35.0–47.0)
HGB: 12.3 g/dL (ref 12.0–16.0)
Lymphocyte #: 1.7 10*3/uL (ref 1.0–3.6)
Lymphocyte %: 14.8 %
MCH: 27.8 pg (ref 26.0–34.0)
MCHC: 32.6 g/dL (ref 32.0–36.0)
MCV: 86 fL (ref 80–100)
Monocyte #: 0.5 x10 3/mm (ref 0.2–0.9)
Monocyte %: 4.5 %
NEUTROS PCT: 78.7 %
Neutrophil #: 9.2 10*3/uL — ABNORMAL HIGH (ref 1.4–6.5)
Platelet: 254 10*3/uL (ref 150–440)
RBC: 4.43 10*6/uL (ref 3.80–5.20)
RDW: 15.8 % — ABNORMAL HIGH (ref 11.5–14.5)
WBC: 11.7 10*3/uL — ABNORMAL HIGH (ref 3.6–11.0)

## 2013-09-03 LAB — TROPONIN I: Troponin-I: <0.02 ng/mL

## 2013-09-04 ENCOUNTER — Inpatient Hospital Stay: Payer: Self-pay | Admitting: Surgery

## 2013-10-19 ENCOUNTER — Ambulatory Visit: Payer: Self-pay | Admitting: Surgery

## 2013-11-09 DIAGNOSIS — R918 Other nonspecific abnormal finding of lung field: Secondary | ICD-10-CM | POA: Insufficient documentation

## 2013-11-12 ENCOUNTER — Ambulatory Visit: Payer: Self-pay | Admitting: Cardiothoracic Surgery

## 2014-01-27 ENCOUNTER — Ambulatory Visit: Payer: Self-pay | Admitting: Specialist

## 2014-02-11 ENCOUNTER — Ambulatory Visit: Payer: Self-pay | Admitting: Cardiothoracic Surgery

## 2014-02-17 ENCOUNTER — Ambulatory Visit: Payer: Self-pay | Admitting: Specialist

## 2014-02-18 ENCOUNTER — Ambulatory Visit: Payer: Self-pay | Admitting: Cardiothoracic Surgery

## 2014-04-19 DIAGNOSIS — G4733 Obstructive sleep apnea (adult) (pediatric): Secondary | ICD-10-CM | POA: Insufficient documentation

## 2014-04-26 DIAGNOSIS — Z85850 Personal history of malignant neoplasm of thyroid: Secondary | ICD-10-CM | POA: Insufficient documentation

## 2014-04-26 DIAGNOSIS — E890 Postprocedural hypothyroidism: Secondary | ICD-10-CM | POA: Insufficient documentation

## 2014-09-02 ENCOUNTER — Ambulatory Visit
Admit: 2014-09-02 | Disposition: A | Discharge status: Home / Self Care | Payer: Self-pay | Attending: Cardiothoracic Surgery | Admitting: Cardiothoracic Surgery

## 2014-09-03 ENCOUNTER — Ambulatory Visit
Admit: 2014-09-03 | Disposition: A | Discharge status: Home / Self Care | Payer: Self-pay | Attending: Cardiothoracic Surgery | Admitting: Cardiothoracic Surgery

## 2014-09-07 NOTE — H&P (Signed)
PATIENT NAME:  Brenda Barber, RAJEWSKI MR#:  [id number] DATE OF BIRTH:  1955-09-24  DATE OF ADMISSION:  01/10/2012  REFERRING PHYSICIAN: Belva Bertin, MD     PRIMARY CARE PHYSICIAN: None local  CHIEF COMPLAINT: Shortness of breath.   HISTORY OF PRESENT ILLNESS: The patient is a 59 year old Caucasian female with a history of asthma, hypothyroidism, hypertension, who has been living in the Shelter for the past month. The patient presents with shortness of breath, nasal congestion, and a productive cough yellowish in nature which started on Tuesday. The patient also had some sore throat at that time. Since then, her shortness of breath has progressed, and she has been wheezing. She denies having any tobacco abuse or chronic obstructive pulmonary disease history; however, she has had some sick contacts at the [facility]. Here she had mild leukocytosis, and x-ray of the chest shows bilateral interstitial opacities; and Hospitalist Service was contacted for further evaluation and management.   PAST MEDICAL HISTORY:  1. Asthma.  2. History of transient ischemic attack in 2001.  3. Hyperlipidemia.  4. Thyroid cancer, status post removal and now hypothyroid state.  5. Hypertension.   PAST SURGICAL HISTORY: Thyroid surgery.   ALLERGIES: Denies.   CURRENT MEDICATIONS:  1. Flovent 220 mcg inhaled 1 puff b.i.d. 2. Levothyroxine 200 mcg daily.  3. Tylenol 500 mg, 2 tabs every 6 hours as needed for pain. 4. Atenolol 25 mg p.o. daily, but it is not on her completed medicine record, but she states she is on it. 5. Ventolin HFA 90 mcg inhaled 2 puffs q.i.d. as needed.  6. Veramyst 27.5 mcg nasal spray once a day.   SOCIAL HISTORY: No tobacco, alcohol or drug use. She lives in the [place].   FAMILY HISTORY: Dad with hypertension, diabetes and heart issues. Mom with lung issues and hypertension.    REVIEW OF SYSTEMS: CONSTITUTIONAL: Denies fever, fatigue. Positive for weight gain. EYES: No blurry vision or double  vision. ENT: No tinnitus. Positive for nasal congestion and some sore throat earlier in the week. RESPIRATORY: Positive for productive cough and wheezing. Positive for asthma. CARDIOVASCULAR: Positive for pleuritic chest pain when  she coughs. Palpitations. Positive high blood pressure. GI: No nausea, vomiting, diarrhea, abdominal pain. No dark stools or bloody stools. She had no dysuria or frequency. ENDOCRINE: No polyuria or nocturia. Positive for history of thyroid cancer. HEMATOLOGIC/LYMPHATIC: No anemia or easy bruising. SKIN: No new rashes. MUSCULOSKELETAL: Denies arthritis. NEUROLOGIC: Denies numbness or weakness. PSYCHIATRIC: Denies anxiety or insomnia.   PHYSICAL EXAMINATION:  VITAL SIGNS: Temperature on arrival 98.6, pulse rate was 93-last one was 100, respiratory rate on arrival 24, blood pressure 110/74, oxygen saturation 95% on room air on arrival.   GENERAL: The patient is an obese Caucasian female lying in bed in no obvious distress, talking in full sentences. However, she does have some pursed lip breathing.   HEENT: Normocephalic, atraumatic. Pupils are equal and reactive. Anicteric sclerae. Dry mucous membranes. Oropharynx is clear otherwise.   NECK: Supple. No thyroid tenderness.   RESPIRATORY: Scattered wheezing bilaterally with decreased breath sounds and some expiratory wheezes.   CARDIOVASCULAR: S1, S2, tachycardic. No murmurs, rubs, or gallops.   ABDOMEN: Positive bowel sounds in all quadrants. No organomegaly noted. No tenderness.  Soft.   SKIN: No obvious rashes.   NEUROLOGIC: Cranial nerves II through XII are grossly intact. Strength is five out of five in all extremities. Sensation intact to light touch.   PSYCHIATRIC: Awake, alert, oriented x3. Pleasant, conversant.  EXTREMITIES: No significant lower extremity edema. Extremities are warm and well perfused.  LABORATORY, DIAGNOSTIC AND RADIOLOGICAL DATA: BNP 345. Glucose 152, potassium 3.4, sodium 138. Troponin  negative. WBC 13.9, hemoglobin 11.3, hematocrit 34.2, platelets are 214. X-ray of the chest is showing bilateral interstitial opacity, more conspicuous slightly on the right mid lung, mild prominence of the right hilum. EKG: Normal sinus rhythm, rate is 94, no acute ST elevations or depressions. No significant T wave changes, either.   ASSESSMENT AND PLAN: A 59 year old Caucasian female with history of hypertension, hyperlipidemia, asthma, hypothyroid state, presenting with shortness of breath and productive cough since Tuesday without any fevers; however, here she has SIRS criteria of tachycardia and leukocytosis and possible pneumonia per x-ray of the chest. At this point, I would admit the patient to the hospital, and if a pneumonia she has been given a dose of Levaquin which I would continue. I would check urine strep and Legionella antigens and flu, although this is rather early for the flu season; but the patient appears to have  more of a flulike symptom with sick contact as well.   In regards to the asthma exacerbation, I would continue her nebulizers would make them around-the-clock and start her on IV steroids and oxygen supplement, as needed. I would hold the atenolol that she is on for blood pressure given the bronchospasm and change it to Norvasc. I would check a TSH and resume her Synthroid. I would start her on deep vein thrombosis prophylaxis with heparin.   CODE STATUS:  FULL CODE.     TOTAL TIME SPENT:  50 minutes. ____________________________ Vivien Presto, MD sa:cbb D: 01/10/2012 12:13:52 ET T: 01/10/2012 12:32:26 ET JOB#: [id number] cc: Vivien Presto, MD, <Dictator> Vivien Presto MD ELECTRONICALLY SIGNED 01/13/2012 16:17

## 2014-09-07 NOTE — Consult Note (Signed)
59 yo with asthma, htn, hld, thyroid ca s/p removal and now on replacement, living in shelter for a month pw sob/cough/nasal congestion w/t fevers. positive wheezing likely to possible pna. pt with tachycardia/leukocytosis.  xray shows possible atypical infxn.  admit.  levaquin, blood/sputum cx. urine for strep/legionella ag. flu testexacerbation:  nebs atc, iv steroids.levaquin.  oxygenhold atenolol  given abvove. change to norvasccheck tsh.  synthroidfor dvt ppx   Electronic Signatures: Vivien Presto (MD)  (Signed on 22-Aug-13 12:06)  Authored  Last Updated: 22-Aug-13 12:06 by Vivien Presto (MD)

## 2014-09-07 NOTE — Discharge Summary (Signed)
PATIENT NAME:  Brenda Barber, Brenda Barber MR#:  [id number] DATE OF BIRTH:  10-03-1955  DATE OF ADMISSION:  01/10/2012 DATE OF DISCHARGE:  01/13/2012  CHIEF COMPLAINT: Shortness of breath.   DISCHARGE DIAGNOSES:  1. Systemic inflammatory response syndrome, possibly secondary to atypical pneumonia, possibly viral.  2. Asthma exacerbation.  3. Hypertension.  4. Diabetes.  5. Hypothyroidism.  6. Hyperlipidemia.  7. Anemia likely chronic disease.  8. Acute renal failure.  9. History of transient ischemic attack in 2001.   DISCHARGE MEDICATIONS: 1. Levothyroxine 200 mcg daily.  2. Veramyst 27.5 mcg per inhalation nasal spray one spray once a day.  3. Ventolin HFA 90 mcg per inhaled aerosol 2 puffs 4 times a day as needed for shortness of breath. 4. Glipizide 2.5 mg orally 1 tab daily.  5. Simvastatin 10 mg daily.  6. Amlodipine 5 mg daily.  7. Advair 250/50 mcg inhaled 1 puff two times a day.  8. Prednisone 10 mg tab 4 tabs x1 day then 3 tabs orally x1 day then 2 tabs orally x1 day then 1 tab orally x1 day then stop.  9. Levaquin 500 mg 1 tab p.o. daily for three days.   DIET: Low sodium, ADA diet.   ACTIVITY: As tolerated.   FOLLOW UP: Please follow up with your primary care physician within 1 to 2 weeks.   DISPOSITION: Discharge back to the shelter.   HISTORY OF PRESENT ILLNESS: For full details of the history and physical, please see the dictation on 01/10/2012 by Dr. Bridgette Habermann, but briefly, this is a 59 year old Caucasian female with asthma, hypothyroidism, hypertension who has been living the shelter for about a month who presented with productive cough yellowish in nature, leukocytosis. An x-ray of the chest showed bilateral interstitial opacities and hospitalist services were contacted for admission. Patient appeared to have pneumonia and also has had sick contacts in the shelter with similar respiratory symptoms.   LABORATORY, DIAGNOSTIC AND RADIOLOGICAL DATA: Initial BNP 345, creatinine 1.13  which jumped to 1.59, last creatinine 1.19, potassium today 3.3, hemoglobin A1c 6.7, ferritin 272, iron serum 108, TIBC 320. Initial WBC 13.9, peak 19.1, last WBC 14.9. Initial hemoglobin 11.3, platelets 214, last hemoglobin 10.5. TSH 1.33. Troponin negative x1. Blood cultures on arrival no growth to date. Rapid flu negative. Urine strep and Legionella antigens were negative and sputum cultures appeared to be normal flora.   HOSPITAL COURSE: Patient was admitted to the hospitalist service. In regards to the SIRS criteria patient was pancultured including urine strep and Legionella antigens were obtained which were all negative. Sputum cultures have grown normal flora. Patient appears to have a atypical infection in the lung, possibly viral. Patient has had several other sick contacts and there are two residents from that shelter from her room hospitalized now for similar symptoms. She was started on Levaquin. She has no fever and the leukocytosis which initially increased is trending down. Initial increase I would think is secondary to the steroids she was on for her respiratory symptoms. She is to continue Levaquin for an additional three days. Patient appeared to also have an asthma exacerbation and was started on nebulizers around-the-clock with cough medicine. Her Flovent was continued, however, as this is a hospitalization for asthma with a flare, it was changed to Advair upon discharge and patient was instructed to continue the p.r.n. albuterol. Her atenolol was also discontinued given the bronchospastic disease and it was changed to amlodipine. Her TSH was okay and her Synthroid was resumed. Patient did have  acute renal failure and was started on IV fluids and last creatinine is better. She did develop hyperglycemia, however, it was not all secondary to the steroids. Patient had a hemoglobin A1c of 6.5. Previously she was told that she might have been diabetic or prediabetes state. Here she was started on  low dose glipizide which could be uptitrated as needed as an outpatient. She did have a lower trend in hemoglobin but it trended back up without any blood transfusions. Iron studies were sent which does not indicate iron deficiency anemia and patient has normocytic MCV. At this point she is to follow up with her primary care physician for follow up in regards to the diabetes, asthma and anemia.   DISPOSITION: Back to the shelter.   CODE STATUS: Patient is FULL CODE.   ____________________________ Vivien Presto, MD sa:cms D: 01/13/2012 12:50:13 ET T: 01/13/2012 14:06:16 ET JOB#: [id number]  cc: Vivien Presto, MD, <Dictator> Vivien Presto MD ELECTRONICALLY SIGNED 01/30/2012 0:16

## 2014-09-07 NOTE — Discharge Summary (Signed)
PATIENT NAME:  Brenda Barber, Brenda Barber MR#:  [id number] DATE OF BIRTH:  03/05/1956  DATE OF ADMISSION:  01/15/2012 DATE OF DISCHARGE:  01/17/2012  DIAGNOSES:  1. Atrial fibrillation. Patient is currently in normal sinus. 2. Recent treatment for atypical pneumonia.  3. Anemia. 4. Acute renal failure, resolved.  5. Hypokalemia.  6. Hypertension. 7. Diabetes. 8. Hypothyroidism. 9. Hyperlipidemia.   DISPOSITION: Patient is being discharged home.   FOLLOW UP: Follow up with [facility]. Follow-up with Dr. Neoma Laming in 1 to 2 weeks after discharge.   DIET: Low sodium, 1800 calorie ADA diet.   ACTIVITY: As tolerated.   DISCHARGE MEDICATIONS:  1. Synthroid 200 mcg once a day.  2. Veramyst 12.5 mcg per inhalation 1 spray once a day to the nostrils. 3. Ventolin HFA 90 mcg 2 puffs q.i.d. as needed.  4. Glipizide 2.5 mg daily.  5. Simvastatin 10 mg daily.  6. Advair 250/50, 1 puff b.i.d.  7. Aspirin 325 mg daily.  8. Amiodarone 400 mg twice a day.   CONSULTATION: Cardiology consultation with Dr. Neoma Laming.   LABORATORY, DIAGNOSTIC, AND RADIOLOGICAL DATA: 2-D echo showed ejection fraction of 50% to 55% and no other abnormalities. Chest x-ray showed mild basilar opacities likely secondary to atelectasis from low lung volume. White count 13, hemoglobin 11.9, platelet count 277, glucose 195, BUN 29. Creatinine 1.71 on admission, normal by the time of discharge. Potassium 3.2, supplemented. AST, ALT were elevated but have gone back to normal. Cardiac enzymes are negative. TSH is normal. Urine drug screen is normal.   HOSPITAL COURSE: Patient is a 59 year old female with past medical history of diabetes, hypertension, hypothyroidism who was recently discharged from hospital after treatment for atypical pneumonia. She came back to the hospital complaining of palpitations and was found to have atrial fibrillation with RVR. She was admitted to the Intensive Care Unit, initially started on Cardizem  drip. A cardiology consultation was obtained by Dr. Neoma Laming who advised to discontinue the Cardizem and started her on amiodarone drip. Following initiation of amiodarone drip patient converted back into sinus rhythm and has now been transitioned to oral amiodarone dose. She will continue her current dose of amiodarone and follow up with Dr. Humphrey Rolls in one week where the plan is to taper her amiodarone drip further. Echo was done and showed ejection fraction of 50% to 55% with no other abnormalities. Since she has converted to normal sinus rhythm the cardiologist recommended anticoagulation with full dose aspirin. Her cardiac enzymes have been negative, TSH was normal and urine drug screen was negative. Patient was recently in the hospital for atypical pneumonia. Her repeat chest x-ray showed atelectasis but no infiltrate. She was afebrile. Her white count was improved from prior admission. During the hospitalization she completed her antibiotic course and steroid taper. Her anemia remained stable. She had acute renal failure which resolved with IV fluid hydration. During her last admission also her creatinine was up. She had hypokalemia which resolved with oral supplementation. Her hypertension remained well controlled. She had elevated AST and ALT which normalized. Her last hemoglobin A1c was 6.7. Her diabetes remained well controlled during the hospitalization. She is being discharged in a stable condition.   TIME SPENT: 45 minutes.   ____________________________ Cherre Huger, MD sp:cms D: 01/17/2012 16:37:48 ET T: 01/18/2012 11:42:31 ET JOB#: [id number]  cc: Cherre Huger, MD, <Dictator> [facility] Cherre Huger MD ELECTRONICALLY SIGNED 01/18/2012 13:41

## 2014-09-07 NOTE — H&P (Signed)
PATIENT NAME:  Brenda Barber, GIBLER MR#:  [id number] DATE OF BIRTH:  [birth year]  DATE OF ADMISSION:  01/15/2012  REFERRING PHYSICIAN: Dr. Thomasene Lot PRIMARY CARE PHYSICIAN: [facility]  CHIEF COMPLAINT: Palpitation.   HISTORY OF PRESENT ILLNESS: This is a 59 year old female who was recently discharged from [place] for atypical pneumonia, was discharged last Sunday. Patient was discharged to shelter. Patient reports having an episode where she did not feel well and felt palpitation. Denies any chest pain, altered mental status, confusion but had some complaints of shortness of breath. Upon presentation to ED patient was found to be in atrial fibrillation with heart rate in the 170s. Patient was started on IV Cardizem drip where heart rate currently much controlled in the mid 90s. Patient denies any previous history of atrial fibrillation or irregular heart rhythm. Patient had EKG done 2012 and 2013. Once reviewed, they did not show any evidence of atrial fibrillation or arrhythmia. Patient's labs showed elevated creatinine of 1.7 where it essentially was 1.19 before two days. As well she had hypokalemia level of 3.2 and had negative cardiac enzymes. Patient is known to have history of hypothyroidism but her TSH was within normal limits .    PAST MEDICAL HISTORY:  1. History of asthma.  2. Hypertension.  3. Diabetes.  4. Hypothyroidism.  5. Hyperlipidemia.  6. Anemia.  7. History of transient ischemic attack in 2001.  8. Recent diagnosis of atypical pneumonia. Currently finishing Levaquin and prednisone tapering dose.  9. History of thyroid cancer, status post removal, now hypothyroid state.   PAST SURGICAL HISTORY: Thyroid surgery.   ALLERGIES: No known drug allergies.   HOME MEDICATIONS:  1. Synthroid 200 mcg daily.  2. Veramyst 27.5 mcg inhalation daily.  3. Ventolin 2 puffs 4 times a day as needed.  4. Glipizide 2.5 mg daily.  5. Simvastatin 10 mg daily.  6. Norvasc 5 mg daily.   7. Advair 250/50, 1 puff 2 times a day.  8. Prednisone tapering dose 40 mg x1 day then 30 mg x1 day then 20 mg x1 day.  9. Levaquin 500 mg daily for a total of two days.   FAMILY HISTORY: Significant for hypertension and diabetes.   SOCIAL HISTORY: No tobacco, alcohol, or drug use. She lives in a shelter.   REVIEW OF SYSTEMS: CONSTITUTIONAL: Patient denies any fever, fatigue, weakness. EYES: Denies blurry vision, double vision or pain. ENT: Denies tinnitus, ear pain, hearing loss, epistaxis. RESPIRATORY: Complains of mild cough and shortness of breath. Denies any hemoptysis, chronic obstructive pulmonary disease. CARDIOVASCULAR: Denies any chest pain, edema. Has complaints of palpitations and lightheadedness. GASTROINTESTINAL: Denies nausea, vomiting, diarrhea, abdominal pain, hematemesis, melena. GENITOURINARY: Denies dysuria, hematuria, renal colic. ENDO: Denies polyuria, polydipsia, heat or cold intolerance. Has hypothyroidism. HEMATOLOGY: Denies anemia, easy bruising, bleeding diathesis. INTEGUMENT: Denies any acne, rash, or lesions. MUSCULOSKELETAL: Denies any neck pain, shoulder pain, back pain, arthritis or gout. NEUROLOGIC: Denies numbness, weakness, dysarthria, epilepsy, tremors, vertigo. PSYCHIATRIC: Denies anxiety, insomnia, schizophrenia or bipolar disorder, alcohol or substance abuse.   PHYSICAL EXAMINATION:  VITAL SIGNS: Temperature 98, pulse 87, respiratory rate 16, blood pressure 114/81, saturating 98% on 3 liters oxygen.   GENERAL: Well-nourished female looks comfortable in bed in no apparent distress.   HEENT: Head atraumatic, normocephalic. Pupils equal, reactive to light. Pink conjunctivae. Anicteric sclerae. Moist oral mucosa.   NECK: Supple. No thyromegaly. No JVD. Has surgical scar from previous thyroidectomy surgery.   CARDIOVASCULAR: S1, S2 heard. Irregular rhythm. No rubs, murmur,  gallops.   CHEST: Good air entry bilaterally. No wheezing, rales, rhonchi.   ABDOMEN:  Soft, nontender, nondistended. Bowel sounds present.   EXTREMITIES: No edema. No clubbing. No cyanosis.   PSYCHIATRIC: Appropriate affect. Awake, alert x3. Intact judgment and insight.   NEUROLOGIC: Cranial nerves grossly intact. Motor 5/5. Sensation symmetrical and intact. Reflexes intact and symmetrical.   SKIN: Normal skin turgor. No rash, warm and dry.   LABORATORY, DIAGNOSTIC AND RADIOLOGICAL DATA: Glucose 195, BUN 29, creatinine 1.71, sodium 144, potassium 3.2, chloride 107, CO2 25, total protein 6.9, albumin 3.1, troponin less than 0.02, TSH 1.38. Drugs of abuse negative for barbiturates, amphetamine, cocaine, opiates.   White blood cells 13, hemoglobin 11.9, hematocrit 36.2, platelets 277.   EKG showing atrial fibrillation with ventricular rate of 169.   ASSESSMENT AND PLAN:  1. Atrial fibrillation with RVR. This appears to be new onset atrial fibrillation. Patient was started on Cardizem drip. As well she will be started on p.o. Cardizem hoping to wean her off IV Cardizem drip. Will be started on aspirin even though patient's CHADS2 score required her to be on anticoagulation,  given the fact she lives in a shelter and the situation of follow ups is unclear yet. Will consult cardiology service on call to see if there is any indication for cardioversion at this point. First set of cardiac enzymes are negative, will repeat another two sets of troponins.  2. Acute renal failure. Will continue with IV fluids. Will repeat creatinine in 24 hours.  3. Hypokalemia. Will replace. As well will give him magnesium and will repeat level in 24 hours.  4. Recent history of atypical pneumonia. Will continue her on Levaquin and tapering dose prednisone.  5. Hypertension. Will hold her home medication as she is being started on Cardizem. 6. Diabetes. Will hold oral hyperglycemic agents and will continue her on insulin sliding scale.  7. Hypothyroidism. TSH within normal limits. Will continue with  Synthroid.  8. Hyperlipidemia. Continue with statin. 9. Deep vein thrombosis prophylaxis. Sub-Q heparin. GI prophylaxis Protonix.  10. CODE STATUS: FULL CODE.   TOTAL TIME SPENT ON PATIENT CARE: 55 minutes.   ____________________________ Albertine Patricia, MD dse:cms D: 01/15/2012 06:24:25 ET T: 01/15/2012 08:12:42 ET JOB#: [id number]  cc: Albertine Patricia, MD, <Dictator> [facility] Bronwyn Belasco Graciela Husbands MD ELECTRONICALLY SIGNED 01/15/2012 22:19

## 2014-09-07 NOTE — Consult Note (Signed)
Converted to Sinus rhythm, may go home on amiodrone 400 daily, with f/u in office next week.  Electronic Signatures: Angelica Ran (MD)  (Signed on 29-Aug-13 16:38)  Authored  Last Updated: 29-Aug-13 16:38 by Angelica Ran (MD)

## 2014-09-07 NOTE — Consult Note (Signed)
PATIENT NAME:  Brenda Barber, Brenda Barber MR#:  [id number] DATE OF BIRTH:  [birth year]  DATE OF CONSULTATION:  01/15/2012  REFERRING PHYSICIAN:   CONSULTING PHYSICIAN:  Dionisio David, MD  HISTORY OF PRESENT ILLNESS:  This is a 59 year old white female with a past medical history of palpitations and pneumonia who came in feeling sick, short of breath, with altered mental status. She was discharged last Sunday because of atypical pneumonia and was treated here. She came into the Emergency Room with heart rate of 170, atrial fibrillation. She was given Cardizem to control the heart rate. Her creatinine was 1.7.   PAST MEDICAL HISTORY: History of asthma, hypertension, diabetes, hypothyroidism, hyperlipidemia, anemia, history of atypical pneumonia.   MEDICATIONS:  Ventolin, glipizide, simvastatin, Norvasc, Advair Diskus, prednisone, and Levaquin.   FAMILY HISTORY: Positive for hypertension and diabetes.   SOCIAL HISTORY: Unremarkable.   PHYSICAL EXAMINATION:  GENERAL: She is alert, oriented times three, in no acute distress.     VITAL SIGNS: Right now are stable.   NECK: No JVD.   LUNGS: Clear.   HEART: Regular rate and rhythm. Normal S1, S2. No audible murmur.   ABDOMEN: Soft, nontender, positive bowel sounds.   EXTREMITIES: No pedal edema.  Initial EKG showed atrial fibrillation, rate 169 with ST depression inferiorly and laterally. Follow-up EKG showed atrial fibrillation with a rate of 122 with nonspecific ST-T changes. On the monitor she is having atrial fibrillation with rate 90.   LABORATORY DATA: Creatinine initially was 1.19, now is 1.71.  Her hemoglobin is 11.9. White count is 13.   ASSESSMENT AND PLAN: The patient is having atrial fibrillation with rapid ventricular response rate. Cardizem controlled the rate but the patient is still in atrial fibrillation. Advised  discontinuing amiodarone and starting IV amiodarone. Thank you very much for the referral.      ____________________________ Dionisio David, MD sak:bjt D: 01/15/2012 09:01:34 ET T: 01/15/2012 09:15:23 ET JOB#: [id number]  cc: Dionisio David, MD, <Dictator> Dionisio David MD ELECTRONICALLY SIGNED 02/25/2012 15:05

## 2014-09-11 NOTE — Discharge Summary (Signed)
PATIENT NAME:  Brenda Barber, Brenda Barber MR#:  [id number] DATE OF BIRTH:  [birth year]  DATE OF ADMISSION:  09/04/2013 DATE OF DISCHARGE:  09/06/2013  PRINCIPAL DIAGNOSIS: Acute sigmoid colon diverticulitis.   OTHER DIAGNOSES:  Hypertension, obesity, diabetes, history of thyroid cancer, emphysema or asthma.   HOSPITAL COURSE: The patient was admitted to the hospital following a CT scan which showed a very thin and limited area of extraluminal gas and fluid in the mid sigmoid colon associated with acute sigmoid diverticulitis. This represented a well localized/contained or possibly microperforation. She was initially given a clear liquid diet and IV antibiotics and her diet was advanced to a low fiber diet which she was instructed in by the dietitian. She was tolerating this quite well and afebrile and had 2 bowel movements and was therefore discharged home. Her IV antibiotics were converted to p.o. Augmentin and she was instructed to take this for 10 days postoperative.   DISCHARGE INSTRUCTIONS:  She was given an appointment to see Dr. Marina Gravel on April 21st or 22nd and to continue her low fiber diet for 6 weeks. Call our office in the interim for any problems.   MEDICATIONS AT THE TIME OF DISCHARGE: Were Veramyst 1 spray nasal daily, Ventolin HFA 2 puffs q.i.d. p.r.n. shortness of breath, Advair Diskus 1 puff b.i.d., albuterol ipratropium inhaled p.r.n., levothyroxine 175 mcg daily, glipizide 5 mg daily, loratadine 10 mg daily, calcitriol 0.25 mcg b.i.d., metoprolol 50 mg b.i.d., Qvar 80 mcg inhaler 1 puff b.i.d., Augmentin 875/125 b.i.d.   ____________________________ Consuela Mimes, MD wfm:cs D: 09/06/2013 09:50:50 ET T: 09/06/2013 18:07:51 ET JOB#: [id number]  cc: Consuela Mimes, MD, <Dictator> Consuela Mimes MD ELECTRONICALLY SIGNED 09/12/2013 3:07

## 2014-09-11 NOTE — H&P (Signed)
Subjective/Chief Complaint LLQ abdominal pain   History of Present Illness 59 y/o female with history of htn, type 2 DM presents with acute onset LLQ abdominal pain staring yesterday around 5 pm.  Similar but less intense episode a while back which did not prompt medical attention.  one episode of emesis, no sick contacts, no jaundice and low grade temp.  Previous colonoscopy in last five years showed sigmoid diverticulosis.   Past History htn obesity DM h/o thyroid cancer   Past Medical Health Hypertension, Diabetes Mellitus   Past Med/Surgical Hx:  Atrial Fibrillation:   Diabetes:   Asthma:   thyroid ca:   htn:   cva:   hypercholestrolemia:   hypothyroid:   ALLERGIES:  No Known Allergies:   HOME MEDICATIONS: Medication Instructions Status  amoxicillin 400 mg/5 mL oral liquid 6 milliliter(s) orally 2 times a day Active  Advair Diskus 250 mcg-50 mcg inhalation powder 1 puff(s) inhaled 2 times a day Active  glipiZIDE 2.5 milligram(s) orally  one tab daily Active  levothyroxine 200 mcg (0.2 mg) oral tablet 1 tab(s) orally once a day Active  Veramyst 27.5 mcg/inh nasal spray 1 spray(s) nasal once a day Active  Ventolin HFA 90 mcg/inh inhalation aerosol 2 puff(s) inhaled 4 times a day, As Needed- for Shortness of Breath  Active  albuterol-ipratropium 2.5 mg-0.5 mg/3 mL inhalation solution  inhaled , As Needed Active  metoprolol 50 mg oral tablet, extended release 1 tab(s) orally once a day Active   Family and Social History:  Family History Non-Contributory   Social History negative tobacco, negative ETOH   Place of Living Home   Review of Systems:  Subjective/Chief Complaint see above   Abdominal Pain Yes   Tolerating Diet No   Medications/Allergies Reviewed Medications/Allergies reviewed   Physical Exam:  GEN no acute distress, obese, disheveled, 99.2 p 104 rr 22 bp 144/80   HEENT pale conjunctivae, PERRL   NECK supple   RESP normal resp effort   CARD  regular rate   ABD positive tenderness  no liver/spleen enlargement  soft  focal LLQ peritoneal signs   EXTR negative cyanosis/clubbing   SKIN normal to palpation   NEURO cranial nerves intact   PSYCH A+O to time, place, person   Lab Results: Hepatic:  16-Apr-15 19:47   Bilirubin, Total 0.5  Alkaline Phosphatase 103 (45-117 NOTE: New Reference Range 04/10/13)  SGPT (ALT) 22  SGOT (AST) 19  Total Protein, Serum 7.4  Albumin, Serum 3.6  Routine Chem:  16-Apr-15 19:47   Glucose, Serum  123  BUN  22  Creatinine (comp) 0.96  Sodium, Serum 139  Potassium, Serum 3.7  Chloride, Serum 104  CO2, Serum 28  Calcium (Total), Serum  7.7  Osmolality (calc) 282  eGFR (African American) >60  eGFR (Non-African American) >60 (eGFR values <78m/min/1.73 m2 may be an indication of chronic kidney disease (CKD). Calculated eGFR is useful in patients with stable renal function. The eGFR calculation will not be reliable in acutely ill patients when serum creatinine is changing rapidly. It is not useful in  patients on dialysis. The eGFR calculation may not be applicable to patients at the low and high extremes of body sizes, pregnant women, and vegetarians.)  Anion Gap 7  Cardiac:  16-Apr-15 19:47   Troponin I < 0.02 (0.00-0.05 0.05 ng/mL or less: NEGATIVE  Repeat testing in 3-6 hrs  if clinically indicated. >0.05 ng/mL: POTENTIAL  MYOCARDIAL INJURY. Repeat  testing in 3-6 hrs if  clinically indicated.  NOTE: An increase or decrease  of 30% or more on serial  testing suggests a  clinically important change)  Routine UA:  16-Apr-15 19:47   Color (UA) Yellow  Clarity (UA) Clear  Glucose (UA) 50 mg/dL  Bilirubin (UA) Negative  Ketones (UA) Negative  Specific Gravity (UA) 1.020  Blood (UA) Negative  pH (UA) 6.0  Protein (UA) 30 mg/dL  Nitrite (UA) Negative  Leukocyte Esterase (UA) Trace (Result(s) reported on 03 Sep 2013 at 08:24PM.)  RBC (UA) 1 /HPF  WBC (UA) 5 /HPF   Bacteria (UA) NONE SEEN  Epithelial Cells (UA) 1 /HPF  Mucous (UA) PRESENT (Result(s) reported on 03 Sep 2013 at 08:24PM.)  Routine Hem:  16-Apr-15 19:47   WBC (CBC)  11.7  RBC (CBC) 4.43  Hemoglobin (CBC) 12.3  Hematocrit (CBC) 37.9  Platelet Count (CBC) 254  MCV 86  MCH 27.8  MCHC 32.6  RDW  15.8  Neutrophil % 78.7  Lymphocyte % 14.8  Monocyte % 4.5  Eosinophil % 1.6  Basophil % 0.4  Neutrophil #  9.2  Lymphocyte # 1.7  Monocyte # 0.5  Eosinophil # 0.2  Basophil # 0.1 (Result(s) reported on 03 Sep 2013 at 08:07PM.)   Radiology Results: LabUnknown:    17-Apr-15 02:48, CT Abdomen and Pelvis With Contrast  PACS Image  CT:  CT Abdomen and Pelvis With Contrast  REASON FOR EXAM:    (1) LLQ pain with nausea,; (2) LLQ pain with nausea,  COMMENTS:       PROCEDURE: CT  - CT ABDOMEN / PELVIS  W  - Sep 04 2013  2:48AM     CLINICAL DATA:  Left lower quadrant abdominal pain and vomiting.    EXAM:  CT ABDOMEN AND PELVIS WITH CONTRAST    TECHNIQUE:  Multidetector CT imaging of the abdomen and pelvis was performed  using the standard protocol following bolus administration of  intravenous contrast.  CONTRAST:  100 mL Isovue 370 IV    COMPARISON:  None.    FINDINGS:  Acute diverticulitis of the sigmoid colon present with inflammation  primarily lateral to the lumen of the mid sigmoid colon. At this  level, there is a collection of extraluminal air measuring  approximately 7 mm in thickness and 3.9 cm in length. This is not  associated with significant fluid or a liquefied abscess. No bowel  obstruction is seen.    Other bowel loops are unremarkable. The liver, gallbladder, spleen,  pancreas, adrenal glands and kidneys are within normal limits. No  enlarged lymph nodes or focal masses are identified. The bladder is  moderately distended and unremarkable. No hernias are identified. No  vascular abnormalities. Moderate spondylosis present at L4-5 and  L5-S1.      IMPRESSION:  Acute diverticulitis of the sigmoid colon. There is an extraluminal  collection posterolateral to the sigmoid colon that predominately  contains air and a liquefied abscess is not currently identified.      Electronically Signed    By: Aletta Edouard M.D.    On: 09/04/2013 02:56       Verified By: Azzie Roup, M.D.,    Assessment/Admission Diagnosis 59 y/o female with complicated sigmoidal diverticulitis   Plan admit, SSI IV abx, bowel rest and re-examine.   Electronic Signatures: Sherri Rad (MD)  (Signed 17-Apr-15 04:00)  Authored: CHIEF COMPLAINT and HISTORY, PAST MEDICAL/SURGIAL HISTORY, ALLERGIES, HOME MEDICATIONS, FAMILY AND SOCIAL HISTORY, REVIEW OF SYSTEMS, PHYSICAL EXAM, LABS, Radiology, ASSESSMENT AND PLAN   Last Updated:  17-Apr-15 04:00 by Sherri Rad (MD)

## 2014-09-27 ENCOUNTER — Other Ambulatory Visit: Payer: Self-pay | Admitting: Family Medicine

## 2014-09-27 DIAGNOSIS — G4452 New daily persistent headache (NDPH): Secondary | ICD-10-CM

## 2014-09-27 DIAGNOSIS — I6789 Other cerebrovascular disease: Secondary | ICD-10-CM

## 2014-09-28 ENCOUNTER — Ambulatory Visit
Admission: RE | Admit: 2014-09-28 | Discharge: 2014-09-28 | Disposition: A | Discharge status: Home / Self Care | Payer: Medicaid Other | Source: Ambulatory Visit | Attending: Family Medicine | Admitting: Family Medicine

## 2014-09-28 DIAGNOSIS — R51 Headache: Secondary | ICD-10-CM | POA: Insufficient documentation

## 2014-09-28 DIAGNOSIS — I6523 Occlusion and stenosis of bilateral carotid arteries: Secondary | ICD-10-CM | POA: Diagnosis not present

## 2014-09-28 DIAGNOSIS — I6789 Other cerebrovascular disease: Secondary | ICD-10-CM

## 2014-09-28 DIAGNOSIS — G4452 New daily persistent headache (NDPH): Secondary | ICD-10-CM

## 2014-10-25 ENCOUNTER — Encounter: Payer: Self-pay | Admitting: *Deleted

## 2014-10-25 ENCOUNTER — Emergency Department
Admission: EM | Admit: 2014-10-25 | Discharge: 2014-10-25 | Disposition: A | Discharge status: Home / Self Care | Payer: Medicaid Other | Attending: Emergency Medicine | Admitting: Emergency Medicine

## 2014-10-25 ENCOUNTER — Emergency Department: Payer: Medicaid Other

## 2014-10-25 DIAGNOSIS — I10 Essential (primary) hypertension: Secondary | ICD-10-CM | POA: Insufficient documentation

## 2014-10-25 DIAGNOSIS — Z7951 Long term (current) use of inhaled steroids: Secondary | ICD-10-CM | POA: Insufficient documentation

## 2014-10-25 DIAGNOSIS — F419 Anxiety disorder, unspecified: Secondary | ICD-10-CM | POA: Insufficient documentation

## 2014-10-25 DIAGNOSIS — G43901 Migraine, unspecified, not intractable, with status migrainosus: Secondary | ICD-10-CM | POA: Diagnosis not present

## 2014-10-25 DIAGNOSIS — R51 Headache: Secondary | ICD-10-CM | POA: Diagnosis present

## 2014-10-25 DIAGNOSIS — Z79899 Other long term (current) drug therapy: Secondary | ICD-10-CM | POA: Insufficient documentation

## 2014-10-25 LAB — COMPREHENSIVE METABOLIC PANEL
ALT: 19 U/L (ref 14–54)
AST: 22 U/L (ref 15–41)
Albumin: 4.6 g/dL (ref 3.5–5.0)
Alkaline Phosphatase: 90 U/L (ref 38–126)
Anion gap: 11 (ref 5–15)
BUN: 12 mg/dL (ref 6–20)
CALCIUM: 8.4 mg/dL — AB (ref 8.9–10.3)
CHLORIDE: 104 mmol/L (ref 101–111)
CO2: 28 mmol/L (ref 22–32)
Creatinine, Ser: 0.94 mg/dL (ref 0.44–1.00)
GFR calc Af Amer: >60 mL/min (ref >60)
Glucose, Bld: 107 mg/dL — ABNORMAL HIGH (ref 65–99)
POTASSIUM: 3.3 mmol/L — AB (ref 3.5–5.1)
Sodium: 143 mmol/L (ref 135–145)
Total Bilirubin: 0.7 mg/dL (ref 0.3–1.2)
Total Protein: 7.9 g/dL (ref 6.5–8.1)

## 2014-10-25 LAB — CBC
HEMATOCRIT: 39.5 % (ref 35.0–47.0)
HEMOGLOBIN: 12.7 g/dL (ref 12.0–16.0)
MCH: 27.7 pg (ref 26.0–34.0)
MCHC: 32.1 g/dL (ref 32.0–36.0)
MCV: 86.3 fL (ref 80.0–100.0)
PLATELETS: 226 10*3/uL (ref 150–440)
RBC: 4.57 MIL/uL (ref 3.80–5.20)
RDW: 15.9 % — AB (ref 11.5–14.5)
WBC: 8.7 10*3/uL (ref 3.6–11.0)

## 2014-10-25 MED ORDER — SODIUM CHLORIDE 0.9 % IV SOLN
20.0000 mg | Freq: Once | INTRAVENOUS | Status: AC
  Administered 2014-10-25: 20 mg via INTRAVENOUS
  Filled 2014-10-25: qty 4

## 2014-10-25 MED ORDER — ONDANSETRON HCL 4 MG/2ML IJ SOLN
INTRAMUSCULAR | Status: AC
  Administered 2014-10-25: 4 mg via INTRAVENOUS
  Filled 2014-10-25: qty 2

## 2014-10-25 MED ORDER — MORPHINE SULFATE 4 MG/ML IJ SOLN
4.0000 mg | Freq: Once | INTRAMUSCULAR | Status: AC
Start: 2014-10-25 — End: 2014-10-25
  Administered 2014-10-25: 4 mg via INTRAVENOUS

## 2014-10-25 MED ORDER — DIPHENHYDRAMINE HCL 50 MG/ML IJ SOLN
25.0000 mg | Freq: Once | INTRAMUSCULAR | Status: AC
  Administered 2014-10-25: 25 mg via INTRAVENOUS

## 2014-10-25 MED ORDER — DEXAMETHASONE SODIUM PHOSPHATE 10 MG/ML IJ SOLN
10.0000 mg | Freq: Once | INTRAMUSCULAR | Status: AC
  Administered 2014-10-25: 10 mg via INTRAVENOUS

## 2014-10-25 MED ORDER — ONDANSETRON HCL 4 MG/2ML IJ SOLN
4.0000 mg | Freq: Once | INTRAMUSCULAR | Status: AC
  Administered 2014-10-25: 4 mg via INTRAVENOUS

## 2014-10-25 MED ORDER — SODIUM CHLORIDE 0.9 % IV SOLN
1000.0000 mL | Freq: Once | INTRAVENOUS | Status: AC
  Administered 2014-10-25: 1000 mL via INTRAVENOUS

## 2014-10-25 MED ORDER — MORPHINE SULFATE 4 MG/ML IJ SOLN
INTRAMUSCULAR | Status: AC
  Administered 2014-10-25: 4 mg via INTRAVENOUS
  Filled 2014-10-25: qty 1

## 2014-10-25 MED ORDER — KETOROLAC TROMETHAMINE 30 MG/ML IJ SOLN
INTRAMUSCULAR | Status: AC
  Filled 2014-10-25: qty 1

## 2014-10-25 MED ORDER — DIPHENHYDRAMINE HCL 50 MG/ML IJ SOLN
INTRAMUSCULAR | Status: AC
  Filled 2014-10-25: qty 1

## 2014-10-25 MED ORDER — KETOROLAC TROMETHAMINE 30 MG/ML IJ SOLN
30.0000 mg | Freq: Once | INTRAMUSCULAR | Status: AC
  Administered 2014-10-25: 30 mg via INTRAVENOUS

## 2014-10-25 MED ORDER — AMIODARONE HCL 200 MG PO TABS: 200.0000 mg | ORAL_TABLET | Freq: Every day | ORAL | Status: DC

## 2014-10-25 NOTE — ED Provider Notes (Signed)
[facility] Emergency Department Provider Note  ____________________________________________  Time seen: On arrival  I have reviewed the triage vital signs and the nursing notes.   HISTORY  Chief Complaint Headache   HPI Brenda Barber is a 59 y.o. female who presents with complaints of right-sided headache which she has had intermittently for the last month. She reports the pain is electrical and along the right side of her head and face. It is severe. She denies focal deficits. She was sent by her PCP for evaluation given his headache. She denies visual changes. No fevers no chills. No neck pain. No nausea no vomiting. Patient is at her mental baseline per friend at the bedside.     Past Medical History  Diagnosis Date  . Allergy   . Hyperlipidemia   . Hypertension   . Thyroid disease     hypothyroid   . Hypoparathyroidism   . GERD (gastroesophageal reflux disease)   . Cerebrovascular accident     History of visual deficit  . Thyroid cancer 1999    Papillary  . Depression   . Personality disorder, depressive     Patient Active Problem List   Diagnosis Date Noted  . Personality disorder, depressive 05/08/2011  . GERD 12/23/2009  . HYPERGLYCEMIA 12/23/2009  . 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 GASTROENTERITIS, VIRAL 08/01/2007  . HYPOTHYROIDISM 05/23/2007  . HYPOPARATHYROIDISM 08/12/2006  . HYPERLIPIDEMIA 08/12/2006  . CORNEAL DISORDER 08/12/2006  . HYPERTENSION 08/12/2006  . ALLERGIC RHINITIS 08/12/2006  . REACTIVE AIRWAY DISEASE 08/12/2006  . POSTMENOPAUSAL STATUS 08/12/2006  . ROSACEA 08/12/2006  . COUGH, CHRONIC 08/12/2006  . CEREBROVASCULAR ACCIDENT, HX OF 08/12/2006  . MIGRAINES, HX OF 08/12/2006    Past Surgical History  Procedure Laterality Date  . Cannot tolerate pap / virginal    . Appendectomy    . Thyroidectomy    . Tonsillectomy    . Mri brain  12/1999    Current Outpatient Rx  Name  Route  Sig  Dispense  Refill  .  albuterol (PROVENTIL HFA;VENTOLIN HFA) 108 (90 BASE) MCG/ACT inhaler   Inhalation   Inhale 2 puffs into the lungs 4 (four) times daily as needed for wheezing or shortness of breath.         Marland Kitchen atenolol (TENORMIN) 50 MG tablet   Oral   Take 50 mg by mouth daily.           . calcitRIOL (ROCALTROL) 0.25 MCG capsule      Take 2 capsules every morning and 1 capsule every evening.          . fluticasone (VERAMYST) 27.5 MCG/SPRAY nasal spray   Nasal   Place 1 spray into the nose daily.         Marland Kitchen levothyroxine (SYNTHROID, LEVOTHROID) 200 MCG tablet   Oral   Take 200 mcg by mouth daily.           Marland Kitchen loratadine (CLARITIN) 10 MG tablet   Oral   Take 10 mg by mouth daily.         . mometasone (NASONEX) 50 MCG/ACT nasal spray   Nasal   Place 2 sprays into the nose daily as needed.           . montelukast (SINGULAIR) 10 MG tablet   Oral   Take 10 mg by mouth at bedtime.         . ranitidine (ZANTAC) 150 MG tablet   Oral   Take 150 mg by mouth 2 (  two) times daily.           . simvastatin (ZOCOR) 20 MG tablet   Oral   Take 20 mg by mouth at bedtime.           . traZODone (DESYREL) 50 MG tablet   Oral   Take 50 mg by mouth at bedtime.             Allergies Aspirin-dipyridamole er and Rosuvastatin  Family History  Problem Relation Age of Onset  . Diabetes Father   . Diabetes Paternal Aunt   . Diabetes Paternal Uncle     Social History History  Substance Use Topics  . Smoking status: Never Smoker   . Smokeless tobacco: Not on file  . Alcohol Use: Not on file   does not use alcohol  Review of Systems  Constitutional: Negative for fever. Eyes: Negative for visual changes. ENT: Negative for sore throat Cardiovascular: Negative for chest pain. Respiratory: Negative for shortness of breath. Gastrointestinal: Negative for abdominal pain, vomiting and diarrhea. Genitourinary: Negative for dysuria. Musculoskeletal: Negative for back pain. Skin:  Negative for rash. Neurological: Positive for headache Psychiatric: Positive for anxiety  10-point ROS otherwise negative.  ____________________________________________   PHYSICAL EXAM:  VITAL SIGNS: ED Triage Vitals  Enc Vitals Group     BP 10/25/14 1053 166/102 mmHg     Pulse Rate 10/25/14 1053 87     Resp 10/25/14 1053 18     Temp 10/25/14 1053 97.7 F (36.5 C)     Temp Source 10/25/14 1053 Oral     SpO2 10/25/14 1053 95 %     Weight 10/25/14 1052 150 lb (68.04 kg)     Height 10/25/14 1052 '5\' 3"'$  (1.6 m)     Head Cir --      Peak Flow --      Pain Score 10/25/14 1045 10     Pain Loc --      Pain Edu? --      Excl. in [place]? --      Constitutional: Alert and oriented. Well appearing and in no distress. Patient responds to questions clearly but slowly. Her friend tells me this is normal for her and is secondary to a stroke in the past. Eyes: Conjunctivae are normal. PERRL. ENT   Head: Normocephalic and atraumatic.   Nose: No rhinnorhea.   Mouth/Throat: Mucous membranes are moist. Cardiovascular: Normal rate, regular rhythm. Normal and symmetric distal pulses are present in all extremities. No murmurs, rubs, or gallops. Respiratory: Normal respiratory effort without tachypnea nor retractions. Breath sounds are clear and equal bilaterally.  Gastrointestinal: Soft and non-tender in all quadrants. No distention. There is no CVA tenderness. Genitourinary: deferred Musculoskeletal: Nontender with normal range of motion in all extremities. No lower extremity tenderness nor edema. Neurologic:   No gross focal neurologic deficits are appreciated. Skin:  Skin is warm, dry and intact. No rash noted. Psychiatric: Mood and affect are normal. Patient exhibits appropriate insight and judgment.  ____________________________________________    LABS (pertinent positives/negatives)  Labs Reviewed  CBC - Abnormal; Notable for the following:    RDW 15.9 (*)    All other  components within normal limits  COMPREHENSIVE METABOLIC PANEL - Abnormal; Notable for the following:    Potassium 3.3 (*)    Glucose, Bld 107 (*)    Calcium 8.4 (*)    All other components within normal limits    ____________________________________________   EKG  None  ____________________________________________    RADIOLOGY  CT head negative  ____________________________________________   PROCEDURES  Procedure(s) performed: none  Critical Care performed: none  ____________________________________________   INITIAL IMPRESSION / ASSESSMENT AND PLAN / ED COURSE  Pertinent labs & imaging results that were available during my care of the patient were reviewed by me and considered in my medical decision making (see chart for details).  Difficult to obtain full history on this case. Patient reports his headache has been intermittent for the last month and she has had a normal CT scan in the past. Because she said it was worse today I repeated CT scan which is reassuring. She has no neuro deficits and is completely neuro intact. I suspect this is a migraine versus tic [place] and will treat with IV analgesia and a "headache cocktail"  ____________________________________________   FINAL CLINICAL IMPRESSION(S) / ED DIAGNOSES  Acute Headache  Lavonia Drafts, MD 10/25/14 [phone]

## 2014-10-25 NOTE — ED Notes (Signed)
Pt given meal tray.

## 2014-10-25 NOTE — ED Notes (Signed)
Pt called out to report that she needs to urinate,   Pt is sleepy when I entered room, she arouses when her name is stated but remains drowsy. Pt initially reports that her pain is better  But she still reports that there is 10/10 pain....  The friend at bedside is concerned about letting pt get up to the commode at bedside so pt was placed on the bed pain.   Even after 10 minutes patient only voided scant amount.   pts BP is elevated, discussed this with patient -- she states that she has been out of her medication since Thursday.  The friend at bedside states that she picked up patients medciation today.    Dr. Corky Downs notified of the continued elevated BP and patients continued c/o pain,  No additional orders at this time.

## 2014-10-25 NOTE — Discharge Instructions (Signed)
Follow-up with your doctor or with [name] clinic for ongoing concerns. Turn the emergency department if you have ongoing headaches or other urgent concerns.   Migraine Headache A migraine headache is very bad, throbbing pain on one or both sides of your head. Talk to your doctor about what things may bring on (trigger) your migraine headaches. HOME CARE  Only take medicines as told by your doctor.  Lie down in a dark, quiet room when you have a migraine.  Keep a journal to find out if certain things bring on migraine headaches. For example, write down:  What you eat and drink.  How much sleep you get.  Any change to your diet or medicines.  Lessen how much alcohol you drink.  Quit smoking if you smoke.  Get enough sleep.  Lessen any stress in your life.  Keep lights dim if bright lights bother you or make your migraines worse. GET HELP RIGHT AWAY IF:   Your migraine becomes really bad.  You have a fever.  You have a stiff neck.  You have trouble seeing.  Your muscles are weak, or you lose muscle control.  You lose your balance or have trouble walking.  You feel like you will pass out (faint), or you pass out.  You have really bad symptoms that are different than your first symptoms. MAKE SURE YOU:   Understand these instructions.  Will watch your condition.  Will get help right away if you are not doing well or get worse. Document Released: 02/14/2008 Document Revised: 07/30/2011 Document Reviewed: 01/12/2013 [facility] Patient Information 2015 [place], [place]. This information is not intended to replace advice given to you by your health care provider. Make sure you discuss any questions you have with your health care provider.

## 2014-10-25 NOTE — ED Notes (Signed)
Pt ambulated to the bathroom and voided with no problems.

## 2014-10-25 NOTE — ED Notes (Signed)
Per EMS:  Pt has hx of HA, had a normal CT in may due to same.  Reports that the HA is worse today and pt is HTN.

## 2014-10-25 NOTE — ED Provider Notes (Addendum)
This patient was seen by Dr. Corky Downs today. Please see his H&P for his evaluation and details. He asked me to follow-up on the patient. He is treating the patient for a migraine headache.  Reassessment of patient at 1720 finds her sleeping initially. She awakens appropriately. She is communicative. She reports her pain is better. She still has a little bit of pain around the back of her head.  I reviewed the medications she has received. It all seems quite reasonable. I will add a little bit of a steroid dose to reduce the chance of a bounce back of this headache which is presumed to be migraine in nature.  The patient has a roommate and lives over in [place]. She has a friend here who will help her get home. We will discharge her from the emergency department after the Decadron.  Ahmed Prima, MD 10/25/14 1734  Patient is requesting a refill for her amiodarone 200 mg. She says that she was at the primary care doctor's this morning but they forgot to write her the prescription. The plan was to dispense a two-week supply and have the patient follow-up with cardiology. I will write the perception for a two-week supply.  Ahmed Prima, MD 10/25/14 [phone]

## 2014-10-27 ENCOUNTER — Other Ambulatory Visit: Payer: Self-pay | Admitting: Internal Medicine

## 2014-10-27 DIAGNOSIS — G4452 New daily persistent headache (NDPH): Secondary | ICD-10-CM

## 2014-10-27 DIAGNOSIS — R930 Abnormal findings on diagnostic imaging of skull and head, not elsewhere classified: Secondary | ICD-10-CM

## 2014-10-31 DIAGNOSIS — E892 Postprocedural hypoparathyroidism: Secondary | ICD-10-CM | POA: Insufficient documentation

## 2014-11-01 ENCOUNTER — Other Ambulatory Visit: Payer: Self-pay | Admitting: Family Medicine

## 2014-11-01 ENCOUNTER — Ambulatory Visit
Admission: RE | Admit: 2014-11-01 | Discharge: 2014-11-01 | Disposition: A | Discharge status: Home / Self Care | Payer: Medicaid Other | Source: Ambulatory Visit | Attending: Family Medicine | Admitting: Family Medicine

## 2014-11-01 DIAGNOSIS — G4452 New daily persistent headache (NDPH): Secondary | ICD-10-CM

## 2014-11-01 DIAGNOSIS — R930 Abnormal findings on diagnostic imaging of skull and head, not elsewhere classified: Secondary | ICD-10-CM

## 2014-11-01 DIAGNOSIS — G9389 Other specified disorders of brain: Secondary | ICD-10-CM | POA: Insufficient documentation

## 2014-11-01 DIAGNOSIS — I679 Cerebrovascular disease, unspecified: Secondary | ICD-10-CM | POA: Diagnosis not present

## 2014-11-01 DIAGNOSIS — G935 Compression of brain: Secondary | ICD-10-CM | POA: Diagnosis not present

## 2014-11-01 MED ORDER — GADOBENATE DIMEGLUMINE 529 MG/ML IV SOLN
15.0000 mL | Freq: Once | INTRAVENOUS | Status: AC | PRN
  Administered 2014-11-01: 15 mL via INTRAVENOUS

## 2015-03-12 ENCOUNTER — Emergency Department: Payer: Medicare Other

## 2015-03-12 ENCOUNTER — Emergency Department
Admission: EM | Admit: 2015-03-12 | Discharge: 2015-03-12 | Disposition: A | Discharge status: Home / Self Care | Payer: Medicare Other | Attending: Emergency Medicine | Admitting: Emergency Medicine

## 2015-03-12 ENCOUNTER — Encounter: Payer: Self-pay | Admitting: *Deleted

## 2015-03-12 DIAGNOSIS — Z79899 Other long term (current) drug therapy: Secondary | ICD-10-CM | POA: Insufficient documentation

## 2015-03-12 DIAGNOSIS — N390 Urinary tract infection, site not specified: Secondary | ICD-10-CM | POA: Diagnosis not present

## 2015-03-12 DIAGNOSIS — Z792 Long term (current) use of antibiotics: Secondary | ICD-10-CM | POA: Diagnosis not present

## 2015-03-12 DIAGNOSIS — M25561 Pain in right knee: Secondary | ICD-10-CM | POA: Diagnosis not present

## 2015-03-12 DIAGNOSIS — M545 Low back pain, unspecified: Secondary | ICD-10-CM

## 2015-03-12 DIAGNOSIS — I10 Essential (primary) hypertension: Secondary | ICD-10-CM | POA: Diagnosis not present

## 2015-03-12 DIAGNOSIS — R109 Unspecified abdominal pain: Secondary | ICD-10-CM | POA: Diagnosis present

## 2015-03-12 LAB — CBC WITH DIFFERENTIAL/PLATELET
Basophils Absolute: 0.1 10*3/uL (ref 0–0.1)
Basophils Relative: 1 %
EOS ABS: 0.1 10*3/uL (ref 0–0.7)
Eosinophils Relative: 2 %
HEMATOCRIT: 35.9 % (ref 35.0–47.0)
HEMOGLOBIN: 11.7 g/dL — AB (ref 12.0–16.0)
LYMPHS ABS: 1.7 10*3/uL (ref 1.0–3.6)
Lymphocytes Relative: 23 %
MCH: 27.9 pg (ref 26.0–34.0)
MCHC: 32.7 g/dL (ref 32.0–36.0)
MCV: 85.3 fL (ref 80.0–100.0)
MONOS PCT: 6 %
Monocytes Absolute: 0.4 10*3/uL (ref 0.2–0.9)
NEUTROS PCT: 68 %
Neutro Abs: 5 10*3/uL (ref 1.4–6.5)
Platelets: 212 10*3/uL (ref 150–440)
RBC: 4.21 MIL/uL (ref 3.80–5.20)
RDW: 15.2 % — AB (ref 11.5–14.5)
WBC: 7.4 10*3/uL (ref 3.6–11.0)

## 2015-03-12 LAB — BASIC METABOLIC PANEL
Anion gap: 8 (ref 5–15)
BUN: 24 mg/dL — AB (ref 6–20)
CHLORIDE: 108 mmol/L (ref 101–111)
CO2: 29 mmol/L (ref 22–32)
Calcium: 9.2 mg/dL (ref 8.9–10.3)
Creatinine, Ser: 1.53 mg/dL — ABNORMAL HIGH (ref 0.44–1.00)
GFR calc Af Amer: 42 mL/min — ABNORMAL LOW (ref >60)
GFR calc non Af Amer: 36 mL/min — ABNORMAL LOW (ref >60)
Glucose, Bld: 117 mg/dL — ABNORMAL HIGH (ref 65–99)
Potassium: 3.6 mmol/L (ref 3.5–5.1)
Sodium: 145 mmol/L (ref 135–145)

## 2015-03-12 LAB — URINALYSIS COMPLETE WITH MICROSCOPIC (ARMC ONLY)
BILIRUBIN URINE: NEGATIVE
Glucose, UA: NEGATIVE mg/dL
HGB URINE DIPSTICK: NEGATIVE
Nitrite: NEGATIVE
Protein, ur: 30 mg/dL — AB
Specific Gravity, Urine: 1.03 (ref 1.005–1.030)
pH: 5 (ref 5.0–8.0)

## 2015-03-12 MED ORDER — TRAMADOL HCL 50 MG PO TABS: 100.0000 mg | ORAL_TABLET | Freq: Once | ORAL | Status: DC

## 2015-03-12 MED ORDER — NITROFURANTOIN MONOHYD MACRO 100 MG PO CAPS
100.0000 mg | ORAL_CAPSULE | Freq: Once | ORAL | Status: AC
  Administered 2015-03-12: 100 mg via ORAL
  Filled 2015-03-12: qty 1

## 2015-03-12 MED ORDER — TRAMADOL HCL 50 MG PO TABS
100.0000 mg | ORAL_TABLET | Freq: Once | ORAL | Status: AC
  Administered 2015-03-12: 100 mg via ORAL
  Filled 2015-03-12: qty 2

## 2015-03-12 MED ORDER — NITROFURANTOIN MONOHYD MACRO 100 MG PO CAPS: 100.0000 mg | ORAL_CAPSULE | Freq: Once | ORAL | Status: DC

## 2015-03-12 NOTE — Discharge Instructions (Signed)
You do have a urinary tract infection. You had a normal white blood cell count here bloodstream. Your symptoms still appear to be more of a musculoskeletal type of problem. He may take tramadol for the back pain. Take Macrobid for your urinary tract infection. Call Dr. Linton Ham office and arrange for follow-up later this coming week.  Return to the emergency department if you have uncontrolled pain, fever, or other urgent concerns.  Back Pain, Adult Back pain is very common. The pain often gets better over time. The cause of back pain is usually not dangerous. Most people can learn to manage their back pain on their own.  HOME CARE  Watch your back pain for any changes. The following actions may help to lessen any pain you are feeling:  Stay active. Start with short walks on flat ground if you can. Try to walk farther each day.  Exercise regularly as told by your doctor. Exercise helps your back heal faster. It also helps avoid future injury by keeping your muscles strong and flexible.  Do not sit, drive, or stand in one place for more than 30 minutes.  Do not stay in bed. Resting more than 1-2 days can slow down your recovery.  Be careful when you bend or lift an object. Use good form when lifting:  Bend at your knees.  Keep the object close to your body.  Do not twist.  Sleep on a firm mattress. Lie on your side, and bend your knees. If you lie on your back, put a pillow under your knees.  Take medicines only as told by your doctor.  Put ice on the injured area.  Put ice in a plastic bag.  Place a towel between your skin and the bag.  Leave the ice on for 20 minutes, 2-3 times a day for the first 2-3 days. After that, you can switch between ice and heat packs.  Avoid feeling anxious or stressed. Find good ways to deal with stress, such as exercise.  Maintain a healthy weight. Extra weight puts stress on your back. GET HELP IF:   You have pain that does not go away with rest  or medicine.  You have worsening pain that goes down into your legs or buttocks.  You have pain that does not get better in one week.  You have pain at night.  You lose weight.  You have a fever or chills. GET HELP RIGHT AWAY IF:   You cannot control when you poop (bowel movement) or pee (urinate).  Your arms or legs feel weak.  Your arms or legs lose feeling (numbness).  You feel sick to your stomach (nauseous) or throw up (vomit).  You have belly (abdominal) pain.  You feel like you may pass out (faint).   This information is not intended to replace advice given to you by your health care provider. Make sure you discuss any questions you have with your health care provider.   Document Released: 10/24/2007 Document Revised: 05/28/2014 Document Reviewed: 09/08/2013 Elsevier Interactive Patient Education 2016 [employer]  Urinary Tract Infection A urinary tract infection (UTI) can occur any place along the urinary tract. The tract includes the kidneys, ureters, bladder, and urethra. A type of germ called bacteria often causes a UTI. UTIs are often helped with antibiotic medicine.  HOME CARE   If given, take antibiotics as told by your doctor. Finish them even if you start to feel better.  Drink enough fluids to keep your pee (urine)  clear or pale yellow.  Avoid tea, drinks with caffeine, and bubbly (carbonated) drinks.  Pee often. Avoid holding your pee in for a Steven time.  Pee before and after having sex (intercourse).  Wipe from front to back after you poop (bowel movement) if you are a woman. Use each tissue only once. GET HELP RIGHT AWAY IF:   You have back pain.  You have lower belly (abdominal) pain.  You have chills.  You feel sick to your stomach (nauseous).  You throw up (vomit).  Your burning or discomfort with peeing does not go away.  You have a fever.  Your symptoms are not better in 3 days. MAKE SURE YOU:   Understand these  instructions.  Will watch your condition.  Will get help right away if you are not doing well or get worse.   This information is not intended to replace advice given to you by your health care provider. Make sure you discuss any questions you have with your health care provider.   Document Released: 10/24/2007 Document Revised: 05/28/2014 Document Reviewed: 12/06/2011 Elsevier Interactive Patient Education [employer].

## 2015-03-12 NOTE — ED Notes (Signed)
Pt given urine cup in triage and reports she will attempt to provide urine sample.

## 2015-03-12 NOTE — ED Provider Notes (Signed)
[facility] Emergency Department Provider Note  ____________________________________________  Time seen: 1623  I have reviewed the triage vital signs and the nursing notes.   HISTORY  Chief Complaint Flank Pain  back pain    HPI Brenda Barber is a 59 y.o. female who presents emergency Department complaining of bilateral back pain that began yesterday.  The patient and the person who accompanies her tell me that she has had kidney problems before and they've. Be most concerned about that as a source of her discomfort.  She denies any fever. She is not having any nausea or vomiting. She has no abdominal discomfort. As noted, the pain in her back is bilateral. She denies any dysuria.  She does report that she had a fall approximately 6 weeks ago [place]. She fell forward onto her knees and continues to have discomfort in her right knee. She is able to ambulate fairly comfortably.     Past Medical History  Diagnosis Date  . Allergy   . Hyperlipidemia   . Hypertension   . Thyroid disease     hypothyroid   . Hypoparathyroidism ([place])   . GERD (gastroesophageal reflux disease)   . Cerebrovascular accident [facility])     History of visual deficit  . Thyroid cancer ([place]) 1999    Papillary  . Depression   . Personality disorder, depressive     Patient Active Problem List   Diagnosis Date Noted  . Personality disorder, depressive 05/08/2011  . GERD 12/23/2009  . HYPERGLYCEMIA 12/23/2009  . 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 GASTROENTERITIS, VIRAL 08/01/2007  . HYPOTHYROIDISM 05/23/2007  . HYPOPARATHYROIDISM 08/12/2006  . HYPERLIPIDEMIA 08/12/2006  . CORNEAL DISORDER 08/12/2006  . HYPERTENSION 08/12/2006  . ALLERGIC RHINITIS 08/12/2006  . REACTIVE AIRWAY DISEASE 08/12/2006  . POSTMENOPAUSAL STATUS 08/12/2006  . ROSACEA 08/12/2006  . COUGH, CHRONIC 08/12/2006  . CEREBROVASCULAR ACCIDENT, HX OF 08/12/2006  . MIGRAINES, HX OF 08/12/2006    Past Surgical  History  Procedure Laterality Date  . Cannot tolerate pap / virginal    . Appendectomy    . Thyroidectomy    . Tonsillectomy    . Mri brain  12/1999    Current Outpatient Rx  Name  Route  Sig  Dispense  Refill  . albuterol (PROVENTIL HFA;VENTOLIN HFA) 108 (90 BASE) MCG/ACT inhaler   Inhalation   Inhale 2 puffs into the lungs 4 (four) times daily as needed for wheezing or shortness of breath.         Marland Kitchen amiodarone (PACERONE) 200 MG tablet   Oral   Take 1 tablet (200 mg total) by mouth daily.   14 tablet   0   . atenolol (TENORMIN) 50 MG tablet   Oral   Take 50 mg by mouth daily.           . calcitRIOL (ROCALTROL) 0.25 MCG capsule      Take 2 capsules every morning and 1 capsule every evening.          . fluticasone (VERAMYST) 27.5 MCG/SPRAY nasal spray   Nasal   Place 1 spray into the nose daily.         Marland Kitchen levothyroxine (SYNTHROID, LEVOTHROID) 200 MCG tablet   Oral   Take 200 mcg by mouth daily.           Marland Kitchen loratadine (CLARITIN) 10 MG tablet   Oral   Take 10 mg by mouth daily.         . mometasone (  NASONEX) 50 MCG/ACT nasal spray   Nasal   Place 2 sprays into the nose daily as needed.           . montelukast (SINGULAIR) 10 MG tablet   Oral   Take 10 mg by mouth at bedtime.         . nitrofurantoin, macrocrystal-monohydrate, (MACROBID) 100 MG capsule   Oral   Take 1 capsule (100 mg total) by mouth once.   14 capsule   0   . ranitidine (ZANTAC) 150 MG tablet   Oral   Take 150 mg by mouth 2 (two) times daily.           . simvastatin (ZOCOR) 20 MG tablet   Oral   Take 20 mg by mouth at bedtime.           . traMADol (ULTRAM) 50 MG tablet   Oral   Take 2 tablets (100 mg total) by mouth once.   16 tablet   0   . traZODone (DESYREL) 50 MG tablet   Oral   Take 50 mg by mouth at bedtime.             Allergies Aspirin-dipyridamole er and Rosuvastatin  Family History  Problem Relation Age of Onset  . Diabetes Father   . Diabetes  Paternal Aunt   . Diabetes Paternal Uncle     Social History Social History  Substance Use Topics  . Smoking status: Never Smoker   . Smokeless tobacco: None  . Alcohol Use: No    Review of Systems  Constitutional: Negative for fever. ENT: Negative for sore throat. Cardiovascular: Negative for chest pain. Respiratory: Negative for cough. Gastrointestinal: Negative for abdominal pain, vomiting and diarrhea. Genitourinary: Negative for dysuria. Patient with back pain. She is concerned that this may be renal in nature. Musculoskeletal: Back pain, right knee pain, see history of present illness. Skin: Negative for rash. Neurological: Negative for paresthesia or weakness   10-point ROS otherwise negative.  ____________________________________________   PHYSICAL EXAM:  VITAL SIGNS: ED Triage Vitals  Enc Vitals Group     BP 03/12/15 1552 123/72 mmHg     Pulse Rate 03/12/15 1552 71     Resp 03/12/15 1552 16     Temp 03/12/15 1552 97.5 F (36.4 C)     Temp Source 03/12/15 1552 Oral     SpO2 03/12/15 1552 94 %     Weight 03/12/15 1552 157 lb (71.215 kg)     Height 03/12/15 1552 '5\' 4"'$  (1.626 m)     Head Cir --      Peak Flow --      Pain Score 03/12/15 1553 10     Pain Loc --      Pain Edu? --      Excl. in [place]? --     Constitutional:  Alert and oriented. No distress. Patient does appear to have a low energy affect. ENT   Head: Normocephalic and atraumatic.   Nose: No congestion/rhinnorhea.    Cardiovascular: Normal rate, regular rhythm, no murmur noted Respiratory:  Normal respiratory effort, no tachypnea.    Breath sounds are clear and equal bilaterally.  Gastrointestinal: Soft and nontender. No distention.  Back: No muscle spasm. The patient does have tenderness through her lumbar area. No focal tenderness midline. No pain into the buttocks. On percussion, she has a slight increased amount of pain on the left. Negative straight leg raise. Intact motor and  sensation distally. Musculoskeletal: No deformity noted. Normal  range of motion in all extremities, though she does have discomfort in the right knee with flexion. The right knee has no focal tenderness, including over the patella, medial or laterally, or over the tibial plateau.  No noted edema. Neurologic:  Normal speech and language. No gross focal neurologic deficits are appreciated.  Skin:  Skin is warm, dry. No rash noted. Psychiatric: Mood and affect are normal. Speech and behavior are normal.  ____________________________________________    LABS (pertinent positives/negatives)  Labs Reviewed  URINALYSIS COMPLETEWITH MICROSCOPIC ([facility] ONLY) - Abnormal; Notable for the following:    Color, Urine AMBER (*)    APPearance CLOUDY (*)    Ketones, ur TRACE (*)    Protein, ur 30 (*)    Leukocytes, UA 2+ (*)    Bacteria, UA RARE (*)    Squamous Epithelial / LPF 6-30 (*)    All other components within normal limits  BASIC METABOLIC PANEL - Abnormal; Notable for the following:    Glucose, Bld 117 (*)    BUN 24 (*)    Creatinine, Ser 1.53 (*)    GFR calc non Af Amer 36 (*)    GFR calc Af Amer 42 (*)    All other components within normal limits  CBC WITH DIFFERENTIAL/PLATELET - Abnormal; Notable for the following:    Hemoglobin 11.7 (*)    RDW 15.2 (*)    All other components within normal limits     ____________________________________________   EKG    ____________________________________________    RADIOLOGY  X-ray right knee:  IMPRESSION: No fracture or dislocation is seen.  Small suprapatellar knee joint effusion ____________________________________________   PROCEDURES    ____________________________________________   INITIAL IMPRESSION / ASSESSMENT AND PLAN / ED COURSE  Pertinent labs & imaging results that were available during my care of the patient were reviewed by me and considered in my medical decision making (see chart for details).  Overall  well-appearing 59 year old female in no acute distress. She does have discomfort with flexion of the right knee. This is status post fall 6 weeks ago. Suspicion for an acute fracture is low, but given the ongoing discomfort we will get an x-ray today and, presuming no acute issues identified, refer her to orthopedics for follow-up.  Her back pain appears to be more musculoskeletal in nature, however she does have some tenderness that could be described as CVA tenderness on the left. We will check renal function, white blood cell count, and a urinalysis.  ----------------------------------------- 6:17 PM on 03/12/2015 -----------------------------------------  Patient has a normal white blood cell count 7.4. Her BUN is 24 and creatinine 1.53. Her urine shows infection with white blood cells too numerous to count and 2+ leukocyte esterase.   We will treat this urinary tract infection. I still suspect that her back pain is not renal in nature and not pyelonephritis.  I will prescribe tramadol for the discomfort.  ____________________________________________   FINAL CLINICAL IMPRESSION(S) / ED DIAGNOSES  Final diagnoses:  Bilateral low back pain without sciatica  UTI (lower urinary tract infection)      Ahmed Prima, MD 03/12/15 [place]

## 2015-03-12 NOTE — ED Notes (Addendum)
Pt arrived to ED reporting bilateral flank pain that began yesterday. Pt denies pain with urination or blood in urine. No changes in bowel movements noted. Pt reports nothing makes the pain increase or decrease and reports movement does not increase pain.

## 2015-03-16 DIAGNOSIS — R404 Transient alteration of awareness: Secondary | ICD-10-CM | POA: Insufficient documentation

## 2015-03-16 DIAGNOSIS — R569 Unspecified convulsions: Secondary | ICD-10-CM | POA: Insufficient documentation

## 2015-04-21 DIAGNOSIS — R296 Repeated falls: Secondary | ICD-10-CM | POA: Insufficient documentation

## 2015-05-11 ENCOUNTER — Encounter: Payer: Self-pay | Admitting: Physical Therapy

## 2015-05-11 ENCOUNTER — Ambulatory Visit: Payer: Medicare Other | Attending: Neurology | Admitting: Physical Therapy

## 2015-05-11 DIAGNOSIS — R262 Difficulty in walking, not elsewhere classified: Secondary | ICD-10-CM

## 2015-05-11 DIAGNOSIS — R531 Weakness: Secondary | ICD-10-CM

## 2015-05-11 DIAGNOSIS — M25561 Pain in right knee: Secondary | ICD-10-CM | POA: Insufficient documentation

## 2015-05-11 NOTE — Therapy (Signed)
[place] MAIN [facility] SERVICES [address] [place], [place], [address] Phone: [phone]   Fax:  [phone]  Physical Therapy Evaluation  Patient Details  Name: Brenda Barber MRN: [id number] Date of Birth: January 18, 1956 Referring Provider: Dr. Helayne Seminole  Encounter Date: 05/11/2015      PT End of Session - 05/11/15 1358    Visit Number 125   Date for PT Re-Evaluation 08/23/15   Authorization Type g codes   PT Start Time 0145   PT Stop Time 0245   PT Time Calculation (min) 60 min   Equipment Utilized During Treatment Gait belt   Activity Tolerance No increased pain;Patient limited by pain   Behavior During Therapy [facility] for tasks assessed/performed      Past Medical History  Diagnosis Date  . Allergy   . Hyperlipidemia   . Hypertension   . Thyroid disease     hypothyroid   . Hypoparathyroidism ([place])   . GERD (gastroesophageal reflux disease)   . Cerebrovascular accident [facility])     History of visual deficit  . Thyroid cancer ([place]) 1999    Papillary  . Depression   . Personality disorder, depressive     Past Surgical History  Procedure Laterality Date  . Cannot tolerate pap / virginal    . Appendectomy    . Thyroidectomy    . Tonsillectomy    . Mri brain  12/1999    There were no vitals filed for this visit.  Visit Diagnosis:  Difficulty walking  Right knee pain  Weakness      Subjective Assessment - 05/11/15 1356    Subjective Patinet feels unsteady and she has fallen often.   Currently in Pain? Yes   Pain Score 9    Pain Location Knee   Pain Orientation Right   Pain Descriptors / Indicators Sharp   Pain Type Chronic pain   Pain Onset More than a month ago   Pain Frequency Constant            [facility] PT Assessment - 05/11/15 0001    Assessment   Medical Diagnosis unsteady gait   Referring Provider Dr. Helayne Seminole   Onset Date/Surgical Date 01/20/15   Hand Dominance Right   Next MD Visit October 20, 2015   Prior Therapy np   Precautions   Precautions Fall   Restrictions   Weight Bearing Restrictions No   Balance Screen   Has the patient fallen in the past 6 months Yes   How many times? 4   Has the patient had a decrease in activity level because of a fear of falling?  Yes   Is the patient reluctant to leave their home because of a fear of falling?  No   Home Social worker Private residence   Living Arrangements Non-relatives/Friends   Available Help at Discharge Friend(s)   Type of [place] to enter   Entrance Stairs-Number of Steps 3   Entrance Stairs-Rails Right   Home Layout One level   [place] - single point;Grab bars - tub/shower   Prior Function   Level of Independence Independent   Vocation On disability      PAIN: 9/10 right knee  POSTURE:  Left shoulder elevated, left hip elevated   PROM/AROM: WFL BUE/BLE Poor single heel raise BLE  STRENGTH:  Graded on a 0-5 scale Muscle Group Left Right  Shoulder flex    Shoulder  Abd    Shoulder Ext    Shoulder IR/ER    Elbow    Wrist/hand    Hip Flex 3+ 3+  Hip Abd  3+  Hip Add 3 2  Hip Ext 2 2  Hip IR/ER 3+ 3+  Knee Flex 3+ 3+  Knee Ext 3+ 3+  Ankle DF 4 4  Ankle PF 4 4   SENSATION: intact   :   FUNCTIONAL MOBILITY: slow mobility with transfers sit to stand without AD, path deviation   BALANCE: unable to single leg stand or tandem stand   GAIT: Ambulates wihout AD  Short distances ,   OUTCOME MEASURES: TEST Outcome Interpretation  5 times sit<>stand 29.60sec >60 yo, >15 sec indicates increased risk for falls  10 meter walk test     .48            m/s <1.0 m/s indicates increased risk for falls; limited community ambulator  Timed up and Go   19.40              sec <14 sec indicates increased risk for falls  6 minute walk test   900             Feet 1000 feet is community ambulator             Theraeputic exercise: Leg press x 20 x 2  90 lbs 3 way hip YTB x 20 BLE Patient needs occasional verbal cueing to improve posture and cueing to correctly perform exercises slowly.                   PT Education - 05/11/15 1357    Education provided Yes   Education Details HEP   Person(s) Educated Patient   Methods Explanation   Comprehension Verbalized understanding             PT Ayub Term Goals - 05/11/15 1403    PT Sandate TERM GOAL #1   Title Patient will report a worst pain of 3/10 on VAS in   right knee          to improve tolerance with ADLs and reduced symptoms with activities   Time 12   Period Weeks   Status New   PT Brock TERM GOAL #2   Title Patient will be able to perform household work/ chores without increase in symptoms.   Time 12   Period Weeks   Status New   PT Harvell TERM GOAL #3   Title Patient will tolerate 5 seconds of single leg stance without loss of balance to improve ability to get in and out of shower safely.   Time 12   Period Weeks   Status New   PT Silva TERM GOAL #4   Title Patient will reduce timed up and go to <11 seconds to reduce fall risk and demonstrate improved transfer/gait ability.   Time 12   Period Weeks   Status New   PT Hadaway TERM GOAL #5   Title Patient will increase 10 meter walk test to >1.35ms as to improve gait speed for better community ambulation and to reduce fall risk   Time 12   Period Weeks   Additional Krapf Term Goals   Additional [place] Term Goals Yes   PT Corrales TERM GOAL #6   Title Patient will increase six minute walk test distance to >1000 for progression to community ambulator and improve gait ability   Time 12   Period Weeks  Status New               Plan - 06/06/15 1359    Clinical Impression Statement Patient is 59 yr old female with reports of frequent falls, unsteady gait and right knee pain. She lives with a friend and she has 3 steps with 1 railing. Patient has difficulty with cleaning her home, minimal cooking, shoping,  walking [place] distances and intermediate distances.    Pt will benefit from skilled therapeutic intervention in order to improve on the following deficits Abnormal gait;Pain;Decreased activity tolerance;Decreased endurance;Decreased strength;Decreased balance;Difficulty walking;Decreased mobility   Rehab Potential Good   PT Frequency 2x / week   PT Duration 12 weeks   PT Treatment/Interventions Gait training;Cryotherapy;Futures trader;Therapeutic activities;Therapeutic exercise;Balance training;Neuromuscular re-education;Manual techniques   PT Next Visit Plan strengthening and balance   PT Home Exercise Plan strengthening   Consulted and Agree with Plan of Care Patient          G-Codes - 2015/06/06 1425    Functional Assessment Tool Used 5x sit to stand, TUG, 6 MW, 10 MW   Functional Limitation Mobility: Walking and moving around   Mobility: Walking and Moving Around Current Status [id number]) At least 40 percent but less than 60 percent impaired, limited or restricted   Mobility: Walking and Moving Around Goal Status [phone]) At least 20 percent but less than 40 percent impaired, limited or restricted       Problem List Patient Active Problem List   Diagnosis Date Noted  . Personality disorder, depressive 05/08/2011  . GERD 12/23/2009  . HYPERGLYCEMIA 12/23/2009  . 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 GASTROENTERITIS, VIRAL 08/01/2007  . HYPOTHYROIDISM 05/23/2007  . HYPOPARATHYROIDISM 08/12/2006  . HYPERLIPIDEMIA 08/12/2006  . CORNEAL DISORDER 08/12/2006  . HYPERTENSION 08/12/2006  . ALLERGIC RHINITIS 08/12/2006  . REACTIVE AIRWAY DISEASE 08/12/2006  . POSTMENOPAUSAL STATUS 08/12/2006  . ROSACEA 08/12/2006  . COUGH, CHRONIC 08/12/2006  . CEREBROVASCULAR ACCIDENT, HX OF 08/12/2006  . MIGRAINES, HX OF 08/12/2006    Alanson Puls 2015/06/06, 2:26 PM  [place] MAIN [facility] SERVICES [address]  [place], [place], [address] Phone: [phone]   Fax:  [phone]  Name: Brenda Barber MRN: [id number] Date of Birth: 08-24-1955

## 2015-05-18 ENCOUNTER — Ambulatory Visit: Payer: Medicare Other

## 2015-05-18 VITALS — BP 120/70 | HR 66

## 2015-05-18 DIAGNOSIS — R262 Difficulty in walking, not elsewhere classified: Secondary | ICD-10-CM

## 2015-05-18 DIAGNOSIS — R531 Weakness: Secondary | ICD-10-CM

## 2015-05-18 NOTE — Therapy (Signed)
[place] MAIN [facility] SERVICES [address] [place], [place], [address] Phone: [phone]   Fax:  [phone]  Physical Therapy Treatment  Patient Details  Name: Brenda Barber MRN: [id number] Date of Birth: [birth year] Referring Provider: Dr. Helayne Seminole  Encounter Date: 05/18/2015      PT End of Session - 05/18/15 1359    Visit Number 2   Number of Visits 25   Date for PT Re-Evaluation Aug 24, 2015   Authorization Type g codes   PT Start Time 1350   PT Stop Time 1435   PT Time Calculation (min) 45 min   Equipment Utilized During Treatment Gait belt   Activity Tolerance No increased pain;Patient limited by pain   Behavior During Therapy [facility] for tasks assessed/performed      Past Medical History  Diagnosis Date  . Allergy   . Hyperlipidemia   . Hypertension   . Thyroid disease     hypothyroid   . Hypoparathyroidism ([place])   . GERD (gastroesophageal reflux disease)   . Cerebrovascular accident [facility])     History of visual deficit  . Thyroid cancer ([place]) 1999    Papillary  . Depression   . Personality disorder, depressive     Past Surgical History  Procedure Laterality Date  . Cannot tolerate pap / virginal    . Appendectomy    . Thyroidectomy    . Tonsillectomy    . Mri brain  12/1999    Filed Vitals:   05/18/15 1348  BP: 120/70  Pulse: 66  SpO2: 99%    Visit Diagnosis:  Difficulty walking  Weakness      Subjective Assessment - 05/18/15 1349    Subjective Pt reports she is doing well today. No complaints of pain. Pt has performed HEP since initial evaluation. No specific questions or concerns at this time.    Currently in Pain? No/denies  R knee pain increases with leg press        TREATMENT  THER-EX Standing exercises with 2# ankle weights: Marching 2 x 10; SLR 2 x 10; Abduction 2 x 10; Extension 2 x 10; Knee flexion 2 x 10; Heel raises 2 x 10;  Resisted side-steeping RTB 4 lengths x 2; Standing  mini squats 2 x 10 with RTB around knees to encourage abduction; Sit to stand without UE support 2 x 10; Step-ups to 6" step x 10 bilateral; Quantum leg press 105# x 10, 120# x 10;  NEUROMUSCULAR RE-EDUCATION Airex NBOS eyes open/closed x 30 seconds each; Airex NBOS eyes open horizontal and vertical head turns x 30 seconds; Airex cone taps alternating LE x 60 seconds; Tandem gait in // bars x 4 laps;                         PT Education - 05/18/15 1358    Education provided Yes   Education Details HEP reinforced   Person(s) Educated Patient   Methods Explanation;Demonstration   Comprehension Verbalized understanding;Returned demonstration;Verbal cues required             PT Mcclaskey Term Goals - 05/11/15 1403    PT [place] TERM GOAL #1   Title Patient will report a worst pain of 3/10 on VAS in   right knee          to improve tolerance with ADLs and reduced symptoms with activities   Time 12   Period Weeks   Status New   PT  [place] TERM GOAL #2   Title Patient will be able to perform household work/ chores without increase in symptoms.   Time 12   Period Weeks   Status New   PT Kil TERM GOAL #3   Title Patient will tolerate 5 seconds of single leg stance without loss of balance to improve ability to get in and out of shower safely.   Time 12   Period Weeks   Status New   PT List TERM GOAL #4   Title Patient will reduce timed up and go to <11 seconds to reduce fall risk and demonstrate improved transfer/gait ability.   Time 12   Period Weeks   Status New   PT Spagnolo TERM GOAL #5   Title Patient will increase 10 meter walk test to >1.52ms as to improve gait speed for better community ambulation and to reduce fall risk   Time 12   Period Weeks   Additional [place]on Term Goals   Additional Stukes Term Goals Yes   PT Bonanno TERM GOAL #6   Title Patient will increase six minute walk test distance to >1000 for progression to community ambulator and improve gait ability    Time 12   Period Weeks   Status New               Plan - 05/18/15 1401    Clinical Impression Statement Pt demonstrates decreased AROM hip extension during standing exercises. R knee pain increases with leg press. Pt demonstrates fair balance in NME in parallel bars. Pt encouraged to continue HEP and follow-up as scheduled.    Pt will benefit from skilled therapeutic intervention in order to improve on the following deficits Abnormal gait;Pain;Decreased activity tolerance;Decreased endurance;Decreased strength;Decreased balance;Difficulty walking;Decreased mobility   Rehab Potential Good   PT Frequency 2x / week   PT Duration 12 weeks   PT Treatment/Interventions Gait training;Cryotherapy;EFutures traderTherapeutic activities;Therapeutic exercise;Balance training;Neuromuscular re-education;Manual techniques   PT Next Visit Plan strengthening and balance   PT Home Exercise Plan Continue as prescribed at initial evaluation   Consulted and Agree with Plan of Care Patient        Problem List Patient Active Problem List   Diagnosis Date Noted  . Personality disorder, depressive 05/08/2011  . GERD 12/23/2009  . HYPERGLYCEMIA 12/23/2009  . 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 GASTROENTERITIS, VIRAL 08/01/2007  . HYPOTHYROIDISM 05/23/2007  . HYPOPARATHYROIDISM 08/12/2006  . HYPERLIPIDEMIA 08/12/2006  . CORNEAL DISORDER 08/12/2006  . HYPERTENSION 08/12/2006  . ALLERGIC RHINITIS 08/12/2006  . REACTIVE AIRWAY DISEASE 08/12/2006  . POSTMENOPAUSAL STATUS 08/12/2006  . ROSACEA 08/12/2006  . COUGH, CHRONIC 08/12/2006  . CEREBROVASCULAR ACCIDENT, HX OF 08/12/2006  . MIGRAINES, HX OF 08/12/2006   JPhillips GroutPT, DPT   Pacen Watford 05/18/2015, 2:51 PM  [place] [facility] [address] [place] [address]Phone: [phone]  Fax:  [phone] Name: Brenda PAOLILLOMRN:  [id number] of Birth: 8October 24, 1957

## 2015-05-24 ENCOUNTER — Ambulatory Visit: Payer: Medicare Other | Attending: Neurology | Admitting: Physical Therapy

## 2015-05-24 ENCOUNTER — Encounter: Payer: Self-pay | Admitting: Physical Therapy

## 2015-05-24 DIAGNOSIS — R531 Weakness: Secondary | ICD-10-CM

## 2015-05-24 DIAGNOSIS — R262 Difficulty in walking, not elsewhere classified: Secondary | ICD-10-CM

## 2015-05-24 DIAGNOSIS — M25561 Pain in right knee: Secondary | ICD-10-CM | POA: Diagnosis present

## 2015-05-24 NOTE — Therapy (Signed)
[place] MAIN [facility] SERVICES [address] [place], [place], [address] Phone: [phone]   Fax:  [phone]  Physical Therapy Treatment  Patient Details  Name: Brenda Barber MRN: [id number] Date of Birth: [birth year] Referring Provider: Dr. Helayne Seminole  Encounter Date: 05/24/2015      PT End of Session - 05/24/15 1539    Visit Number 3   Number of Visits 25   Date for PT Re-Evaluation 08/28/15   Authorization Type g codes   PT Start Time 0330   PT Stop Time 0410   PT Time Calculation (min) 40 min   Equipment Utilized During Treatment Gait belt   Activity Tolerance No increased pain;Patient limited by pain   Behavior During Therapy [facility] for tasks assessed/performed      Past Medical History  Diagnosis Date  . Allergy   . Hyperlipidemia   . Hypertension   . Thyroid disease     hypothyroid   . Hypoparathyroidism ([place])   . GERD (gastroesophageal reflux disease)   . Cerebrovascular accident [facility])     History of visual deficit  . Thyroid cancer ([place]) 1999    Papillary  . Depression   . Personality disorder, depressive     Past Surgical History  Procedure Laterality Date  . Cannot tolerate pap / virginal    . Appendectomy    . Thyroidectomy    . Tonsillectomy    . Mri brain  12/1999    There were no vitals filed for this visit.  Visit Diagnosis:  Difficulty walking  Weakness  Right knee pain      Subjective Assessment - 05/24/15 1538    Subjective Pt reports she is doing well today. No complaints of pain. Pt has performed HEP since initial evaluation. No specific questions or concerns at this time.    Currently in Pain? No/denies   Pain Onset More than a month ago      THER-EX Standing exercises with 2# ankle weights: 4 way hip YTB x 20 BLE Heel raises 2 x 10;  Resisted side-steeping RTB 10 feet x 2; Standing squats 2 x 10 with RTB around knees to encourage abduction; Sit to stand without UE support 2 x  10; Step-ups to 6" step x 10 bilateral;from blue foam  Quantum leg press 105# x 10, 120# x 10;, heel raises x 20 x 3 sets  NEUROMUSCULAR RE-EDUCATION Airex NBOS eyes open/closed x 30 seconds each; Airex NBOS eyes open horizontal and vertical head turns x 30 seconds; Airex cone taps alternating LE x 60 seconds; Tandem gait in // bars x 10 x 4 Fatigue with sit to stand but demonstrating more control, Increase weight for standing exercises. Fatigue still evident with cross trainer and endurance                           PT Education - 05/24/15 1538    Education provided Yes   Education Details HEP   Person(s) Educated Patient   Methods Explanation   Comprehension Verbalized understanding             PT Bezek Term Goals - 05/11/15 1403    PT Tokar TERM GOAL #1   Title Patient will report a worst pain of 3/10 on VAS in   right knee          to improve tolerance with ADLs and reduced symptoms with activities   Time 12   Period  Weeks   Status New   PT Shew TERM GOAL #2   Title Patient will be able to perform household work/ chores without increase in symptoms.   Time 12   Period Weeks   Status New   PT Plucinski TERM GOAL #3   Title Patient will tolerate 5 seconds of single leg stance without loss of balance to improve ability to get in and out of shower safely.   Time 12   Period Weeks   Status New   PT Juneau TERM GOAL #4   Title Patient will reduce timed up and go to <11 seconds to reduce fall risk and demonstrate improved transfer/gait ability.   Time 12   Period Weeks   Status New   PT Woodberry TERM GOAL #5   Title Patient will increase 10 meter walk test to >1.52ms as to improve gait speed for better community ambulation and to reduce fall risk   Time 12   Period Weeks   Additional Buel Term Goals   Additional Selleck Term Goals Yes   PT Retzloff TERM GOAL #6   Title Patient will increase six minute walk test distance to >1000 for progression to community  ambulator and improve gait ability   Time 12   Period Weeks   Status New               Plan - 05/24/15 1539    Clinical Impression Statement PT provided min - moderate verbal instruction to improve set up, proper use of LE, and improved posture and gait mechanics. Patient responded moderately to instruction   Pt will benefit from skilled therapeutic intervention in order to improve on the following deficits Abnormal gait;Pain;Decreased activity tolerance;Decreased endurance;Decreased strength;Decreased balance;Difficulty walking;Decreased mobility   Rehab Potential Good   PT Frequency 2x / week   PT Duration 12 weeks   PT Treatment/Interventions Gait training;Cryotherapy;EFutures traderTherapeutic activities;Therapeutic exercise;Balance training;Neuromuscular re-education;Manual techniques   PT Next Visit Plan strengthening and balance   PT Home Exercise Plan Continue as prescribed at initial evaluation   Consulted and Agree with Plan of Care Patient        Problem List Patient Active Problem List   Diagnosis Date Noted  . Personality disorder, depressive 05/08/2011  . GERD 12/23/2009  . HYPERGLYCEMIA 12/23/2009  . 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 GASTROENTERITIS, VIRAL 08/01/2007  . HYPOTHYROIDISM 05/23/2007  . HYPOPARATHYROIDISM 08/12/2006  . HYPERLIPIDEMIA 08/12/2006  . CORNEAL DISORDER 08/12/2006  . HYPERTENSION 08/12/2006  . ALLERGIC RHINITIS 08/12/2006  . REACTIVE AIRWAY DISEASE 08/12/2006  . POSTMENOPAUSAL STATUS 08/12/2006  . ROSACEA 08/12/2006  . COUGH, CHRONIC 08/12/2006  . CEREBROVASCULAR ACCIDENT, HX OF 08/12/2006  . MIGRAINES, HX OF 08/12/2006    MAlanson Puls1/07/2015, 3:44 PM  CClintonMAIN [facility]Wal[facility] Center[facility] [address] [address]Phone: [phone]  Fax:  [phone] Name: MCHITARA CLONCHMRN: [id number] of Birth: [birth year]

## 2015-05-30 ENCOUNTER — Ambulatory Visit: Payer: Medicare Other | Admitting: Physical Therapy

## 2015-06-01 ENCOUNTER — Encounter: Payer: Self-pay | Admitting: Physical Therapy

## 2015-06-01 ENCOUNTER — Other Ambulatory Visit: Payer: Self-pay | Admitting: Internal Medicine

## 2015-06-01 ENCOUNTER — Ambulatory Visit: Payer: Medicare Other | Admitting: Physical Therapy

## 2015-06-01 DIAGNOSIS — M25561 Pain in right knee: Secondary | ICD-10-CM

## 2015-06-01 DIAGNOSIS — R531 Weakness: Secondary | ICD-10-CM

## 2015-06-01 DIAGNOSIS — I480 Paroxysmal atrial fibrillation: Secondary | ICD-10-CM | POA: Insufficient documentation

## 2015-06-01 DIAGNOSIS — G935 Compression of brain: Secondary | ICD-10-CM | POA: Insufficient documentation

## 2015-06-01 DIAGNOSIS — R262 Difficulty in walking, not elsewhere classified: Secondary | ICD-10-CM | POA: Diagnosis not present

## 2015-06-01 DIAGNOSIS — Z1231 Encounter for screening mammogram for malignant neoplasm of breast: Secondary | ICD-10-CM

## 2015-06-01 NOTE — Therapy (Signed)
[place] MAIN [facility] SERVICES [address] [place], [place], [address] Phone: [phone]   Fax:  [phone]  Physical Therapy Treatment  Patient Details  Name: Brenda Barber MRN: [id number] Date of Birth: 12-27-1955 Referring Provider: Dr. Helayne Seminole  Encounter Date: 06/01/2015      PT End of Session - 06/01/15 0838    Visit Number 4   Number of Visits 25   Date for PT Re-Evaluation 08-09-2015   Authorization Type g codes   PT Start Time 0830   PT Stop Time 0915   PT Time Calculation (min) 45 min   Equipment Utilized During Treatment Gait belt   Activity Tolerance No increased pain;Patient limited by pain   Behavior During Therapy [facility] for tasks assessed/performed      Past Medical History  Diagnosis Date  . Allergy   . Hyperlipidemia   . Hypertension   . Thyroid disease     hypothyroid   . Hypoparathyroidism ([place])   . GERD (gastroesophageal reflux disease)   . Cerebrovascular accident [facility])     History of visual deficit  . Thyroid cancer ([place]) 1999    Papillary  . Depression   . Personality disorder, depressive     Past Surgical History  Procedure Laterality Date  . Cannot tolerate pap / virginal    . Appendectomy    . Thyroidectomy    . Tonsillectomy    . Mri brain  12/1999    There were no vitals filed for this visit.  Visit Diagnosis:  Difficulty walking  Weakness  Right knee pain      Subjective Assessment - 06/01/15 0837    Subjective Patient says that she is a little bit sore in her legs but is not in any pain.    Pain Onset More than a month ago     Therapeutic exercise: Leg press x 20 x 3 standing hip abd with YTB x 20  side stepping left and right in parallel bars 10 feet x 3 standing on blue foam with cone reaching x 20 across midline step ups from floor to 6 inch stool x 20 bilateral sit to stand x 10 marching in parallel bars x 20 stepping up and down 6 inch stool x 20 Eccentric step  downs x 10 x 2 BLE   Min cueing needed to appropriately perform tasks with leg, hand, and head position. Decreased coordination demonstrated requiring consistent verbal cueing to correct form.  Patient responds well to verbal and tactile cues to correct form and technique.  CGA to SBA for safety with activities.  Uses to increase intensity and amplitude of movements throughout session                           PT Education - 06/01/15 0837    Education provided Yes   Education Details HEP   Person(s) Educated Patient   Methods Explanation   Comprehension Verbalized understanding             PT Gladue Term Goals - 05/11/15 1403    PT Hartt TERM GOAL #1   Title Patient will report a worst pain of 3/10 on VAS in   right knee          to improve tolerance with ADLs and reduced symptoms with activities   Time 12   Period Weeks   Status New   PT Ohlinger TERM GOAL #2   Title  Patient will be able to perform household work/ chores without increase in symptoms.   Time 12   Period Weeks   Status New   PT Arkin TERM GOAL #3   Title Patient will tolerate 5 seconds of single leg stance without loss of balance to improve ability to get in and out of shower safely.   Time 12   Period Weeks   Status New   PT Kohlenberg TERM GOAL #4   Title Patient will reduce timed up and go to <11 seconds to reduce fall risk and demonstrate improved transfer/gait ability.   Time 12   Period Weeks   Status New   PT Trupiano TERM GOAL #5   Title Patient will increase 10 meter walk test to >1.58ms as to improve gait speed for better community ambulation and to reduce fall risk   Time 12   Period Weeks   Additional Massimino Term Goals   Additional Fleissner Term Goals Yes   PT Sandin TERM GOAL #6   Title Patient will increase six minute walk test distance to >1000 for progression to community ambulator and improve gait ability   Time 12   Period Weeks   Status New               Plan - 06/01/15 0839     Clinical Impression Statement Cueing was needed during the leg press to control motion out/back; frequent instances of decreased eccentric control were exhibited.Patient tolerates exercises without a rest period and has no reports of pain   Pt will benefit from skilled therapeutic intervention in order to improve on the following deficits Abnormal gait;Pain;Decreased activity tolerance;Decreased endurance;Decreased strength;Decreased balance;Difficulty walking;Decreased mobility   Rehab Potential Good   PT Frequency 2x / week   PT Duration 12 weeks   PT Treatment/Interventions Gait training;Cryotherapy;EFutures traderTherapeutic activities;Therapeutic exercise;Balance training;Neuromuscular re-education;Manual techniques   PT Next Visit Plan strengthening and balance   PT Home Exercise Plan Continue as prescribed at initial evaluation   Consulted and Agree with Plan of Care Patient        Problem List Patient Active Problem List   Diagnosis Date Noted  . Personality disorder, depressive 05/08/2011  . GERD 12/23/2009  . HYPERGLYCEMIA 12/23/2009  . 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 GASTROENTERITIS, VIRAL 08/01/2007  . HYPOTHYROIDISM 05/23/2007  . HYPOPARATHYROIDISM 08/12/2006  . HYPERLIPIDEMIA 08/12/2006  . CORNEAL DISORDER 08/12/2006  . HYPERTENSION 08/12/2006  . ALLERGIC RHINITIS 08/12/2006  . REACTIVE AIRWAY DISEASE 08/12/2006  . POSTMENOPAUSAL STATUS 08/12/2006  . ROSACEA 08/12/2006  . COUGH, CHRONIC 08/12/2006  . CEREBROVASCULAR ACCIDENT, HX OF 08/12/2006  . MIGRAINES, HX OF 08/12/2006    MAlanson Puls1/03/2016, 9:11 AM  CWestmontMAIN RSt. Luke[facility] New[facility] CampusSERVICES [address] [address]Phone: [phone]  Fax:  [phone] Name: Brenda STRASSNERMRN: [id number] of Birth: [birth year]

## 2015-06-03 ENCOUNTER — Encounter: Payer: Self-pay | Admitting: Physical Therapy

## 2015-06-03 ENCOUNTER — Ambulatory Visit: Payer: Medicare Other | Admitting: Physical Therapy

## 2015-06-03 DIAGNOSIS — M25561 Pain in right knee: Secondary | ICD-10-CM

## 2015-06-03 DIAGNOSIS — R262 Difficulty in walking, not elsewhere classified: Secondary | ICD-10-CM | POA: Diagnosis not present

## 2015-06-03 DIAGNOSIS — R531 Weakness: Secondary | ICD-10-CM

## 2015-06-03 NOTE — Therapy (Signed)
[place] MAIN [facility] SERVICES [address] [place], [place], [address] Phone: [phone]   Fax:  [phone]  Physical Therapy Treatment  Patient Details  Name: Brenda Barber MRN: [id number] Date of Birth: 12/04/1955 Referring Provider: Dr. Helayne Seminole  Encounter Date: 06/03/2015      PT End of Session - 06/03/15 1433    Visit Number 5   Number of Visits 25   Date for PT Re-Evaluation August 18, 2015   Authorization Type g codes   PT Start Time 0145   PT Stop Time 0230   PT Time Calculation (min) 45 min   Equipment Utilized During Treatment Gait belt   Activity Tolerance No increased pain;Patient limited by pain   Behavior During Therapy [facility] for tasks assessed/performed      Past Medical History  Diagnosis Date  . Allergy   . Hyperlipidemia   . Hypertension   . Thyroid disease     hypothyroid   . Hypoparathyroidism ([place])   . GERD (gastroesophageal reflux disease)   . Cerebrovascular accident [facility])     History of visual deficit  . Thyroid cancer ([place]) 1999    Papillary  . Depression   . Personality disorder, depressive     Past Surgical History  Procedure Laterality Date  . Cannot tolerate pap / virginal    . Appendectomy    . Thyroidectomy    . Tonsillectomy    . Mri brain  12/1999    There were no vitals filed for this visit.  Visit Diagnosis:  Difficulty walking  Weakness  Right knee pain      Subjective Assessment - 06/03/15 1432    Subjective Patient says that she is a little nervous becuase she has been referred to a neurosurgen.    Currently in Pain? No/denies   Pain Onset More than a month ago      TREATMENT  THER-EX Standing exercises with 2# ankle weights: Marching 2 x 10; SLR 2 x 10; Abduction 2 x 10; Extension 2 x 10; Knee flexion 2 x 10; Heel raises 2 x 10;  Resisted side-steeping RTB 4 lengths x 2; Standing mini squats 2 x 10 with RTB around knees to encourage abduction; Sit to stand  without UE support 2 x 10; Step-ups to 6" step x 10 bilateral; Quantum leg press 90# x 10, 100# x 10;  NEUROMUSCULAR RE-EDUCATION Level surface eyes open/closed x 30 seconds each; level eyes open horizontal and vertical head turns x 30 seconds; level surface taps alternating LE x 60 seconds; Tandem gait in // bars x 4 laps Matrix side stepping. Back wards/fwd  Stepping x 5 4 square side to side, fwd/bwd, diagonals left and right Patient needs occasional verbal cueing to improve posture and cueing to correctly perform exercises slowly, holding at end of range to increase motor firing of desired muscle to encourage fatigue.                            PT Education - 06/03/15 1433    Education provided Yes   Person(s) Educated Patient   Methods Explanation   Comprehension Verbalized understanding             PT Housewright Term Goals - 05/11/15 1403    PT Lezama TERM GOAL #1   Title Patient will report a worst pain of 3/10 on VAS in   right knee  to improve tolerance with ADLs and reduced symptoms with activities   Time 12   Period Weeks   Status New   PT Cozart TERM GOAL #2   Title Patient will be able to perform household work/ chores without increase in symptoms.   Time 12   Period Weeks   Status New   PT Canevari TERM GOAL #3   Title Patient will tolerate 5 seconds of single leg stance without loss of balance to improve ability to get in and out of shower safely.   Time 12   Period Weeks   Status New   PT Hallisey TERM GOAL #4   Title Patient will reduce timed up and go to <11 seconds to reduce fall risk and demonstrate improved transfer/gait ability.   Time 12   Period Weeks   Status New   PT Stelle TERM GOAL #5   Title Patient will increase 10 meter walk test to >1.18ms as to improve gait speed for better community ambulation and to reduce fall risk   Time 12   Period Weeks   Additional Przybysz Term Goals   Additional Strawser Term Goals Yes   PT Omlor TERM  GOAL #6   Title Patient will increase six minute walk test distance to >1000 for progression to community ambulator and improve gait ability   Time 12   Period Weeks   Status New               Plan - 06/03/15 1434    Clinical Impression Statement Continues to have balance deficits typical with diagnosis. Patient performs intermediate level exercises without pain behaviors and needs verbal cuing for postural alignment and head positioning.   Pt will benefit from skilled therapeutic intervention in order to improve on the following deficits Abnormal gait;Pain;Decreased activity tolerance;Decreased endurance;Decreased strength;Decreased balance;Difficulty walking;Decreased mobility   Rehab Potential Good   PT Frequency 2x / week   PT Duration 12 weeks   PT Treatment/Interventions Gait training;Cryotherapy;EFutures traderTherapeutic activities;Therapeutic exercise;Balance training;Neuromuscular re-education;Manual techniques   PT Next Visit Plan strengthening and balance   PT Home Exercise Plan Continue as prescribed at initial evaluation   Consulted and Agree with Plan of Care Patient        Problem List Patient Active Problem List   Diagnosis Date Noted  . Personality disorder, depressive 05/08/2011  . GERD 12/23/2009  . HYPERGLYCEMIA 12/23/2009  . 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 GASTROENTERITIS, VIRAL 08/01/2007  . HYPOTHYROIDISM 05/23/2007  . HYPOPARATHYROIDISM 08/12/2006  . HYPERLIPIDEMIA 08/12/2006  . CORNEAL DISORDER 08/12/2006  . HYPERTENSION 08/12/2006  . ALLERGIC RHINITIS 08/12/2006  . REACTIVE AIRWAY DISEASE 08/12/2006  . POSTMENOPAUSAL STATUS 08/12/2006  . ROSACEA 08/12/2006  . COUGH, CHRONIC 08/12/2006  . CEREBROVASCULAR ACCIDENT, HX OF 08/12/2006  . MIGRAINES, HX OF 08/12/2006   KAlanson Puls PT, DPT MWesthope KConnecticutS 06/03/2015, 2:42 PM  CSpencerMAIN RLomp[facility] SERVICES [address] [place] [address]Phone: [phone]  Fax:  [phone] Name: Brenda FAVEROMRN: [id number] of Birth: [birth year]

## 2015-06-06 ENCOUNTER — Encounter: Payer: Self-pay | Admitting: Physical Therapy

## 2015-06-06 ENCOUNTER — Ambulatory Visit: Payer: Medicare Other | Admitting: Physical Therapy

## 2015-06-06 DIAGNOSIS — R262 Difficulty in walking, not elsewhere classified: Secondary | ICD-10-CM | POA: Diagnosis not present

## 2015-06-06 DIAGNOSIS — R531 Weakness: Secondary | ICD-10-CM

## 2015-06-06 DIAGNOSIS — M25561 Pain in right knee: Secondary | ICD-10-CM

## 2015-06-06 NOTE — Therapy (Signed)
[place] MAIN [employer] SERVICES [address] [place], [place], [address] Phone: [phone]   Fax:  [phone]  Physical Therapy Treatment  Patient Details  Name: Brenda Barber MRN: [id number] Date of Birth: 11-22-1955 Referring Provider: Dr. Helayne Seminole  Encounter Date: 06/06/2015      PT End of Session - 06/06/15 1354    Visit Number 6   Number of Visits 25   Date for PT Re-Evaluation 2015/08/18   Authorization Type g codes   PT Start Time 0145   PT Stop Time 0225   PT Time Calculation (min) 40 min   Equipment Utilized During Treatment Gait belt   Activity Tolerance No increased pain;Patient limited by pain   Behavior During Therapy [facility] for tasks assessed/performed      Past Medical History  Diagnosis Date  . Allergy   . Hyperlipidemia   . Hypertension   . Thyroid disease     hypothyroid   . Hypoparathyroidism ([place])   . GERD (gastroesophageal reflux disease)   . Cerebrovascular accident [facility])     History of visual deficit  . Thyroid cancer ([place]) 1999    Papillary  . Depression   . Personality disorder, depressive     Past Surgical History  Procedure Laterality Date  . Cannot tolerate pap / virginal    . Appendectomy    . Thyroidectomy    . Tonsillectomy    . Mri brain  12/1999    There were no vitals filed for this visit.  Visit Diagnosis:  Difficulty walking  Weakness  Right knee pain      Subjective Assessment - 06/06/15 1352    Subjective Patient says that she is a little nervous becuase she has been referred to a neurosurgen. She is also having right knee pain today and she has apt next wednesday for her left knee pain.    Currently in Pain? Yes   Pain Score 8    Pain Location Knee   Pain Orientation Right   Pain Onset More than a month ago      THER-EX Standing exercises with 2# ankle weights: Marching 2 x 10; SLR 2 x 10; Abduction 2 x 10; Extension 2 x 10; Knee flexion 2 x 10; Heel raises 2  x 10;  Resisted side-steeping RTB 4 lengths x 2; Standing mini squats 2 x 10 with RTB around knees to encourage abduction; Sit to stand without UE support 2 x 10; Step-ups to 6" step x 10 bilateral; Quantum leg press 105# x 10, 120# x 10;  NEUROMUSCULAR RE-EDUCATION Four square x 10 each direction Airex cone taps alternating LE x 60 seconds; Tandem gait in // bars x 4 laps Patient needs occasional verbal cueing to improve posture and cueing to correctly perform exercises slowly, holding at end of range to increase motor firing of desired muscle to encourage fatigue.                            PT Education - 06/06/15 1354    Education provided Yes   Education Details HEP   Person(s) Educated Patient   Methods Explanation   Comprehension Verbalized understanding             PT Ceballos Term Goals - 05/11/15 1403    PT Rolfe TERM GOAL #1   Title Patient will report a worst pain of 3/10 on VAS in   right knee  to improve tolerance with ADLs and reduced symptoms with activities   Time 12   Period Weeks   Status New   PT Reznick TERM GOAL #2   Title Patient will be able to perform household work/ chores without increase in symptoms.   Time 12   Period Weeks   Status New   PT Strojny TERM GOAL #3   Title Patient will tolerate 5 seconds of single leg stance without loss of balance to improve ability to get in and out of shower safely.   Time 12   Period Weeks   Status New   PT Dilauro TERM GOAL #4   Title Patient will reduce timed up and go to <11 seconds to reduce fall risk and demonstrate improved transfer/gait ability.   Time 12   Period Weeks   Status New   PT Hostetter TERM GOAL #5   Title Patient will increase 10 meter walk test to >1.28ms as to improve gait speed for better community ambulation and to reduce fall risk   Time 12   Period Weeks   Additional Mckiddy Term Goals   Additional Despain Term Goals Yes   PT Ziegler TERM GOAL #6   Title Patient will  increase six minute walk test distance to >1000 for progression to community ambulator and improve gait ability   Time 12   Period Weeks   Status New               Plan - 06/06/15 1355    Clinical Impression Statement Mod cueing needed to appropriately perform strengthening tasks with leg, and head position.    Pt will benefit from skilled therapeutic intervention in order to improve on the following deficits Abnormal gait;Pain;Decreased activity tolerance;Decreased endurance;Decreased strength;Decreased balance;Difficulty walking;Decreased mobility   Rehab Potential Good   PT Frequency 2x / week   PT Duration 12 weeks   PT Treatment/Interventions Gait training;Cryotherapy;EFutures traderTherapeutic activities;Therapeutic exercise;Balance training;Neuromuscular re-education;Manual techniques   PT Next Visit Plan strengthening and balance   PT Home Exercise Plan Continue as prescribed at initial evaluation   Consulted and Agree with Plan of Care Patient        Problem List Patient Active Problem List   Diagnosis Date Noted  . Personality disorder, depressive 05/08/2011  . GERD 12/23/2009  . HYPERGLYCEMIA 12/23/2009  . 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 GASTROENTERITIS, VIRAL 08/01/2007  . HYPOTHYROIDISM 05/23/2007  . HYPOPARATHYROIDISM 08/12/2006  . HYPERLIPIDEMIA 08/12/2006  . CORNEAL DISORDER 08/12/2006  . HYPERTENSION 08/12/2006  . ALLERGIC RHINITIS 08/12/2006  . REACTIVE AIRWAY DISEASE 08/12/2006  . POSTMENOPAUSAL STATUS 08/12/2006  . ROSACEA 08/12/2006  . COUGH, CHRONIC 08/12/2006  . CEREBROVASCULAR ACCIDENT, HX OF 08/12/2006  . MIGRAINES, HX OF 08/12/2006    Brenda Puls1/16/2017, 1:57 PM  Brenda Barber [address] [address]Phone: [phone]  Fax:  [phone] Name: Brenda BROGDENMRN: [id number] of Birth: [birth year]

## 2015-06-08 ENCOUNTER — Encounter: Payer: Self-pay | Admitting: Physical Therapy

## 2015-06-08 ENCOUNTER — Ambulatory Visit: Payer: Medicare Other | Admitting: Physical Therapy

## 2015-06-08 DIAGNOSIS — R262 Difficulty in walking, not elsewhere classified: Secondary | ICD-10-CM

## 2015-06-08 DIAGNOSIS — R531 Weakness: Secondary | ICD-10-CM

## 2015-06-08 DIAGNOSIS — M25561 Pain in right knee: Secondary | ICD-10-CM

## 2015-06-08 NOTE — Therapy (Signed)
[place] MAIN [facility] SERVICES [address] [place], [place], [address] Phone: [phone]   Fax:  [phone]  Physical Therapy Treatment  Patient Details  Name: Brenda Barber MRN: [id number] Date of Birth: 06-18-1955 Referring Provider: Dr. Helayne Seminole  Encounter Date: 06/08/2015      PT End of Session - 06/08/15 1359    Visit Number 7   Number of Visits 25   Date for PT Re-Evaluation 23-Aug-2015   Authorization Type g codes   PT Start Time 0145   PT Stop Time 0230   PT Time Calculation (min) 45 min   Equipment Utilized During Treatment Gait belt   Activity Tolerance No increased pain;Patient limited by pain   Behavior During Therapy [facility] for tasks assessed/performed      Past Medical History  Diagnosis Date  . Allergy   . Hyperlipidemia   . Hypertension   . Thyroid disease     hypothyroid   . Hypoparathyroidism ([place])   . GERD (gastroesophageal reflux disease)   . Cerebrovascular accident [facility])     History of visual deficit  . Thyroid cancer ([place]) 1999    Papillary  . Depression   . Personality disorder, depressive     Past Surgical History  Procedure Laterality Date  . Cannot tolerate pap / virginal    . Appendectomy    . Thyroidectomy    . Tonsillectomy    . Mri brain  12/1999    There were no vitals filed for this visit.  Visit Diagnosis:  Difficulty walking  Weakness  Right knee pain      Subjective Assessment - 06/08/15 1358    Subjective Patient says that she is a little nervous becuase she has been referred to a neurosurgen.    Pain Onset More than a month ago      THER-EX Nustep L3 x 5 minutes for warm-up during history (3 minutes unbilled); Quantum double leg press 105# x 10, 120# x 10, 135# x 10; Heel raises with UE support and toes on 2x4, 2 x 10; Mini squats with RTB around knees to prevent valgus 2 x 10; RTB side stepping in // bars 4 lengths x 2; Sit to stand without UE support RTB around  knees to prevent valgus 2 x 10;  NEUROMUSCULAR RE-ED Gait in hallway with horizontal and vertical head turns 80' x 2; Standing slow marching without UE support 2 x 10; Toe taps on BOSU x 10 bilateral, alternating; Modified tandem stance eyes open/closed x 30 seconds each alternating LE; Modified tandem stance eyes open with horizontal and vertical head turns alternating LE Patient needs occasional verbal cueing to improve posture and cueing to correctly perform exercises slowly, holding at end of range to increase motor firing of desired muscle to encourage fatigue.                             PT Education - 06/08/15 1359    Education provided Yes   Education Details HEP   Person(s) Educated Patient   Methods Explanation   Comprehension Verbalized understanding             PT Faivre Term Goals - 05/11/15 1403    PT Thresher TERM GOAL #1   Title Patient will report a worst pain of 3/10 on VAS in   right knee          to improve tolerance with ADLs and  reduced symptoms with activities   Time 12   Period Weeks   Status New   PT Picotte TERM GOAL #2   Title Patient will be able to perform household work/ chores without increase in symptoms.   Time 12   Period Weeks   Status New   PT Blakley TERM GOAL #3   Title Patient will tolerate 5 seconds of single leg stance without loss of balance to improve ability to get in and out of shower safely.   Time 12   Period Weeks   Status New   PT Norrington TERM GOAL #4   Title Patient will reduce timed up and go to <11 seconds to reduce fall risk and demonstrate improved transfer/gait ability.   Time 12   Period Weeks   Status New   PT Demeritt TERM GOAL #5   Title Patient will increase 10 meter walk test to >1.90ms as to improve gait speed for better community ambulation and to reduce fall risk   Time 12   Period Weeks   Additional Zaino Term Goals   Additional Traister Term Goals Yes   PT Knotek TERM GOAL #6   Title Patient will  increase six minute walk test distance to >1000 for progression to community ambulator and improve gait ability   Time 12   Period Weeks   Status New               Plan - 06/08/15 1400    Clinical Impression Statement Fatigue with sit to stand but demonstrating more control, Increase weight for standing exercises. Fatigue still evident with cross trainer and endurance   Pt will benefit from skilled therapeutic intervention in order to improve on the following deficits Abnormal gait;Pain;Decreased activity tolerance;Decreased endurance;Decreased strength;Decreased balance;Difficulty walking;Decreased mobility   Rehab Potential Good   PT Frequency 2x / week   PT Duration 12 weeks   PT Treatment/Interventions Gait training;Cryotherapy;EFutures traderTherapeutic activities;Therapeutic exercise;Balance training;Neuromuscular re-education;Manual techniques   PT Next Visit Plan strengthening and balance   PT Home Exercise Plan Continue as prescribed at initial evaluation   Consulted and Agree with Plan of Care Patient        Problem List Patient Active Problem List   Diagnosis Date Noted  . Personality disorder, depressive 05/08/2011  . GERD 12/23/2009  . HYPERGLYCEMIA 12/23/2009  . 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 GASTROENTERITIS, VIRAL 08/01/2007  . HYPOTHYROIDISM 05/23/2007  . HYPOPARATHYROIDISM 08/12/2006  . HYPERLIPIDEMIA 08/12/2006  . CORNEAL DISORDER 08/12/2006  . HYPERTENSION 08/12/2006  . ALLERGIC RHINITIS 08/12/2006  . REACTIVE AIRWAY DISEASE 08/12/2006  . POSTMENOPAUSAL STATUS 08/12/2006  . ROSACEA 08/12/2006  . COUGH, CHRONIC 08/12/2006  . CEREBROVASCULAR ACCIDENT, HX OF 08/12/2006  . MIGRAINES, HX OF 08/12/2006    MAlanson Puls1/18/2017, 2:01 PM  CSouth RidingMAIN RSignature [facility] LibertySERVICES [address] [place] [address]Phone: [phone]  Fax:  [phone] Name:  Brenda ROSEMANMRN: [id number] of Birth: 805/17/1957

## 2015-06-13 ENCOUNTER — Encounter: Payer: Self-pay | Admitting: Physical Therapy

## 2015-06-13 ENCOUNTER — Ambulatory Visit: Payer: Medicare Other | Admitting: Physical Therapy

## 2015-06-13 DIAGNOSIS — M25561 Pain in right knee: Secondary | ICD-10-CM

## 2015-06-13 DIAGNOSIS — R262 Difficulty in walking, not elsewhere classified: Secondary | ICD-10-CM

## 2015-06-13 DIAGNOSIS — R531 Weakness: Secondary | ICD-10-CM

## 2015-06-13 NOTE — Therapy (Signed)
[place] MAIN [facility] SERVICES [address] [place], [place], [address] Phone: [phone]   Fax:  [phone]  Physical Therapy Treatment  Patient Details  Name: Brenda Barber MRN: [id number] Date of Birth: [birth year] Referring Provider: Dr. Helayne Seminole  Encounter Date: 06/13/2015      PT End of Session - 06/13/15 1357    Visit Number 8   Number of Visits 25   Date for PT Re-Evaluation 08-30-15   Authorization Type g codes   PT Start Time 0145   PT Stop Time 0230   PT Time Calculation (min) 45 min   Equipment Utilized During Treatment Gait belt   Activity Tolerance No increased pain;Patient limited by pain   Behavior During Therapy [facility] for tasks assessed/performed      Past Medical History  Diagnosis Date  . Allergy   . Hyperlipidemia   . Hypertension   . Thyroid disease     hypothyroid   . Hypoparathyroidism ([place])   . GERD (gastroesophageal reflux disease)   . Cerebrovascular accident [facility])     History of visual deficit  . Thyroid cancer ([place]) 1999    Papillary  . Depression   . Personality disorder, depressive     Past Surgical History  Procedure Laterality Date  . Cannot tolerate pap / virginal    . Appendectomy    . Thyroidectomy    . Tonsillectomy    . Mri brain  12/1999    There were no vitals filed for this visit.  Visit Diagnosis:  Difficulty walking  Weakness  Right knee pain      Subjective Assessment - 06/13/15 1355    Subjective Patient says that she say the neurosurgen and she does not need to go back. She sees the ortho MD on Wed.    Currently in Pain? Yes   Pain Score 2    Pain Onset More than a month ago      THER-EX Nustep L3 x 5 minutes for warm-up during history (3 minutes unbilled); Quantum double leg press 105# x 10, 120# x 10, 135# x 10; Heel raises with UE support and toes on 2x4, 2 x 10; Mini squats with RTB around knees to prevent valgus 2 x 10; RTB side stepping in // bars 4  lengths x 2; Sit to stand without UE support RTB around knees to prevent valgus 2 x 10;  NEUROMUSCULAR RE-ED Gait in hallway with horizontal and vertical head turns 80' x 2; Standing slow marching without UE support 2 x 10; Toe taps on BOSU x 10 bilateral, alternating; Modified tandem stance eyes open/closed x 30 seconds each alternating LE; Modified tandem stance eyes open with horizontal and vertical head turns alternating                             PT Education - 06/13/15 1356    Education provided Yes   Education Details HEP   Person(s) Educated Patient   Methods Explanation   Comprehension Verbalized understanding             PT Rogalski Term Goals - 05/11/15 1403    PT Gehlhausen TERM GOAL #1   Title Patient will report a worst pain of 3/10 on VAS in   right knee          to improve tolerance with ADLs and reduced symptoms with activities   Time 12   Period Weeks  Status New   PT Panepinto TERM GOAL #2   Title Patient will be able to perform household work/ chores without increase in symptoms.   Time 12   Period Weeks   Status New   PT Cotterman TERM GOAL #3   Title Patient will tolerate 5 seconds of single leg stance without loss of balance to improve ability to get in and out of shower safely.   Time 12   Period Weeks   Status New   PT Lythgoe TERM GOAL #4   Title Patient will reduce timed up and go to <11 seconds to reduce fall risk and demonstrate improved transfer/gait ability.   Time 12   Period Weeks   Status New   PT Barrero TERM GOAL #5   Title Patient will increase 10 meter walk test to >1.63ms as to improve gait speed for better community ambulation and to reduce fall risk   Time 12   Period Weeks   Additional Bastarache Term Goals   Additional Breau Term Goals Yes   PT Hawks TERM GOAL #6   Title Patient will increase six minute walk test distance to >1000 for progression to community ambulator and improve gait ability   Time 12   Period Weeks   Status  New               Plan - 06/13/15 1357    Clinical Impression Statement  CGA needed with all activities except walking on 2x4 which needed Min A.   Pt will benefit from skilled therapeutic intervention in order to improve on the following deficits Abnormal gait;Pain;Decreased activity tolerance;Decreased endurance;Decreased strength;Decreased balance;Difficulty walking;Decreased mobility   Rehab Potential Good   PT Frequency 2x / week   PT Duration 12 weeks   PT Treatment/Interventions Gait training;Cryotherapy;EFutures traderTherapeutic activities;Therapeutic exercise;Balance training;Neuromuscular re-education;Manual techniques   PT Next Visit Plan strengthening and balance   PT Home Exercise Plan Continue as prescribed at initial evaluation   Consulted and Agree with Plan of Care Patient        Problem List Patient Active Problem List   Diagnosis Date Noted  . Personality disorder, depressive 05/08/2011  . GERD 12/23/2009  . HYPERGLYCEMIA 12/23/2009  . 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 GASTROENTERITIS, VIRAL 08/01/2007  . HYPOTHYROIDISM 05/23/2007  . HYPOPARATHYROIDISM 08/12/2006  . HYPERLIPIDEMIA 08/12/2006  . CORNEAL DISORDER 08/12/2006  . HYPERTENSION 08/12/2006  . ALLERGIC RHINITIS 08/12/2006  . REACTIVE AIRWAY DISEASE 08/12/2006  . POSTMENOPAUSAL STATUS 08/12/2006  . ROSACEA 08/12/2006  . COUGH, CHRONIC 08/12/2006  . CEREBROVASCULAR ACCIDENT, HX OF 08/12/2006  . MIGRAINES, HX OF 08/12/2006   KAlanson Puls PT, DPT MWeir [place]S 06/13/2015, 2:01 PM  CKeyportMAIN RBoston [facility] Menino CampusSERVICES [address] [address]Phone: [phone]  Fax:  [phone] Name: Brenda DEVINMRN: [id number] of Birth: [birth year]

## 2015-06-14 ENCOUNTER — Other Ambulatory Visit: Payer: Self-pay | Admitting: Internal Medicine

## 2015-06-14 ENCOUNTER — Ambulatory Visit
Admission: RE | Admit: 2015-06-14 | Discharge: 2015-06-14 | Disposition: A | Discharge status: Home / Self Care | Payer: Medicare Other | Source: Ambulatory Visit | Attending: Internal Medicine | Admitting: Internal Medicine

## 2015-06-14 DIAGNOSIS — Z1231 Encounter for screening mammogram for malignant neoplasm of breast: Secondary | ICD-10-CM

## 2015-06-15 ENCOUNTER — Encounter: Payer: Self-pay | Admitting: Physical Therapy

## 2015-06-15 ENCOUNTER — Ambulatory Visit: Payer: Medicare Other | Admitting: Physical Therapy

## 2015-06-15 DIAGNOSIS — R262 Difficulty in walking, not elsewhere classified: Secondary | ICD-10-CM | POA: Diagnosis not present

## 2015-06-15 DIAGNOSIS — R531 Weakness: Secondary | ICD-10-CM

## 2015-06-15 DIAGNOSIS — M25561 Pain in right knee: Secondary | ICD-10-CM

## 2015-06-15 NOTE — Therapy (Signed)
[place] MAIN [facility] SERVICES [address] [place], [place], [address] Phone: [phone]   Fax:  [phone]  Physical Therapy Treatment  Patient Details  Name: Brenda Barber MRN: [id number] Date of Birth: [birth year] Referring Provider: Dr. Helayne Seminole  Encounter Date: 06/15/2015      PT End of Session - 06/15/15 1409    Visit Number 9   Number of Visits 25   Date for PT Re-Evaluation 17-Aug-2015   Authorization Type g codes   PT Start Time 0145   PT Stop Time 0230   PT Time Calculation (min) 45 min   Equipment Utilized During Treatment Gait belt   Activity Tolerance No increased pain;Patient limited by pain   Behavior During Therapy [facility] for tasks assessed/performed      Past Medical History  Diagnosis Date  . Allergy   . Hyperlipidemia   . Hypertension   . Thyroid disease     hypothyroid   . Hypoparathyroidism ([place])   . GERD (gastroesophageal reflux disease)   . Cerebrovascular accident [facility])     History of visual deficit  . Depression   . Personality disorder, depressive   . Thyroid cancer ([place]) 1999    Papillary    Past Surgical History  Procedure Laterality Date  . Cannot tolerate pap / virginal    . Appendectomy    . Thyroidectomy    . Tonsillectomy    . Mri brain  12/1999    There were no vitals filed for this visit.  Visit Diagnosis:  Difficulty walking  Weakness  Right knee pain      Subjective Assessment - 06/15/15 1408    Subjective Patient had a cortisone injection today in her right knee.    Currently in Pain? No/denies   Pain Score 0-No pain   Pain Onset More than a month ago      NEUROMUSCULAR RE-EDUCATION Airex NBOS eyes open/closed x 30 seconds each; Airex NBOS eyes open horizontal and vertical head turns x 30 seconds; Airex cone taps alternating LE x 60 seconds; Tandem gait in // bars x 4 laps  4 square fwd/bwd, side to side, diagonals x 10 BLE Sorting balls on foam, sorting shapes  on foam Cone across midline on foam PT provided min - moderate verbal instruction to improve set up, proper use of LE, and improved posture and gait mechanics. Patient responded moderately to instruction                           PT Education - 06/15/15 1409    Education provided Yes   Education Details HEP   Person(s) Educated Patient   Methods Explanation   Comprehension Verbalized understanding             PT Riquelme Term Goals - 05/11/15 1403    PT Wajda TERM GOAL #1   Title Patient will report a worst pain of 3/10 on VAS in   right knee          to improve tolerance with ADLs and reduced symptoms with activities   Time 12   Period Weeks   Status New   PT Hon TERM GOAL #2   Title Patient will be able to perform household work/ chores without increase in symptoms.   Time 12   Period Weeks   Status New   PT Covin TERM GOAL #3   Title Patient will tolerate 5 seconds of single leg  stance without loss of balance to improve ability to get in and out of shower safely.   Time 12   Period Weeks   Status New   PT Soria TERM GOAL #4   Title Patient will reduce timed up and go to <11 seconds to reduce fall risk and demonstrate improved transfer/gait ability.   Time 12   Period Weeks   Status New   PT Rieman TERM GOAL #5   Title Patient will increase 10 meter walk test to >1.47ms as to improve gait speed for better community ambulation and to reduce fall risk   Time 12   Period Weeks   Additional Vieyra Term Goals   Additional Staton Term Goals Yes   PT Yore TERM GOAL #6   Title Patient will increase six minute walk test distance to >1000 for progression to community ambulator and improve gait ability   Time 12   Period Weeks   Status New               Plan - 06/15/15 1409    Clinical Impression Statement CGA used for all balancing activities due to increase postural sway but no LOB   Pt will benefit from skilled therapeutic intervention in order to  improve on the following deficits Abnormal gait;Pain;Decreased activity tolerance;Decreased endurance;Decreased strength;Decreased balance;Difficulty walking;Decreased mobility   Rehab Potential Good   PT Frequency 2x / week   PT Duration 12 weeks   PT Treatment/Interventions Gait training;Cryotherapy;EFutures traderTherapeutic activities;Therapeutic exercise;Balance training;Neuromuscular re-education;Manual techniques   PT Next Visit Plan strengthening and balance   PT Home Exercise Plan Continue as prescribed at initial evaluation   Consulted and Agree with Plan of Care Patient        Problem List Patient Active Problem List   Diagnosis Date Noted  . Personality disorder, depressive 05/08/2011  . GERD 12/23/2009  . HYPERGLYCEMIA 12/23/2009  . 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 GASTROENTERITIS, VIRAL 08/01/2007  . HYPOTHYROIDISM 05/23/2007  . HYPOPARATHYROIDISM 08/12/2006  . HYPERLIPIDEMIA 08/12/2006  . CORNEAL DISORDER 08/12/2006  . HYPERTENSION 08/12/2006  . ALLERGIC RHINITIS 08/12/2006  . REACTIVE AIRWAY DISEASE 08/12/2006  . POSTMENOPAUSAL STATUS 08/12/2006  . ROSACEA 08/12/2006  . COUGH, CHRONIC 08/12/2006  . CEREBROVASCULAR ACCIDENT, HX OF 08/12/2006  . MIGRAINES, HX OF 08/12/2006    MAlanson Puls1/25/2017, 2:13 PM  CHastingsMAIN RNorth Valley Surgery CenterSERVICES [address]St[address] [place] [address]Phone: [phone]  Fax:  [phone] Name: MNORVA BOWEMRN: [id number] of Birth: 810/30/1957

## 2015-06-20 ENCOUNTER — Ambulatory Visit: Payer: Medicare Other | Admitting: Physical Therapy

## 2015-06-20 ENCOUNTER — Encounter: Payer: Self-pay | Admitting: Physical Therapy

## 2015-06-20 DIAGNOSIS — R262 Difficulty in walking, not elsewhere classified: Secondary | ICD-10-CM | POA: Diagnosis not present

## 2015-06-20 DIAGNOSIS — R531 Weakness: Secondary | ICD-10-CM

## 2015-06-20 DIAGNOSIS — M25561 Pain in right knee: Secondary | ICD-10-CM

## 2015-06-20 NOTE — Therapy (Addendum)
[place] MAIN [facility] SERVICES [address] [place], [place], [address] Phone: [phone]   Fax:  [phone]  Physical Therapy Treatment/ progress notes  Patient Details  Name: Brenda Barber MRN: [id number] Date of Birth: November 23, 1955 Referring Provider: Dr. Helayne Seminole  Encounter Date: 06/20/2015      PT End of Session - 06/20/15 1404    Visit Number 10   Number of Visits 25   Date for PT Re-Evaluation 08-10-15   Authorization Type g codes   PT Start Time 0145   PT Stop Time 0230   PT Time Calculation (min) 45 min   Equipment Utilized During Treatment Gait belt   Activity Tolerance No increased pain;Patient limited by pain   Behavior During Therapy [facility] for tasks assessed/performed      Past Medical History  Diagnosis Date  . Allergy   . Hyperlipidemia   . Hypertension   . Thyroid disease     hypothyroid   . Hypoparathyroidism ([place])   . GERD (gastroesophageal reflux disease)   . Cerebrovascular accident [facility])     History of visual deficit  . Depression   . Personality disorder, depressive   . Thyroid cancer ([place]) 1999    Papillary    Past Surgical History  Procedure Laterality Date  . Cannot tolerate pap / virginal    . Appendectomy    . Thyroidectomy    . Tonsillectomy    . Mri brain  12/1999    There were no vitals filed for this visit.  Visit Diagnosis:  Difficulty walking  Weakness  Right knee pain      Subjective Assessment - 06/20/15 1403    Subjective Patient says that she is haiving some pain in her right knee, she is wearing a brace today.    Currently in Pain? No/denies   Pain Onset More than a month ago         Outcome Interpretation   5 times sit<>stand 25 sec >60 yo, >15 sec indicates increased risk for falls  10 meter walk test  .92 m/s <1.0 m/s indicates increased risk for falls; limited community ambulator  Timed up and Go  11.47 sec <14 sec indicates  increased risk for falls  6 minute walk test  1000 Feet          standing hip abd with YTB x 20  side stepping left and right in parallel bars 10 feet x 3 standing on blue foam with cone reaching x 20 across midline step ups from floor to 6 inch stool x 20 bilateral sit to stand x 10 marching in parallel bars x 20 stepping pattern with weight shifting fwd/bwd x 10.  Patient needs occasional verbal cueing to improve posture and cueing to correctly perform exercises slowly, holding at end of range to increase motor firing of desired muscle to encourage fatigue.                           PT Education - 06/20/15 1404    Education provided Yes   Education Details HEP   Person(s) Educated Patient   Methods Explanation   Comprehension Verbalized understanding             PT Blitzer Term Goals - 06/20/15 1413    PT Moroz TERM GOAL #1   Title Patient will report a worst pain of 3/10 on VAS in   right knee  to improve tolerance with ADLs and reduced symptoms with activities   Status Partially Met   PT Corbo TERM GOAL #2   Title Patient will be able to perform household work/ chores without increase in symptoms.   Status Achieved   PT Wessman TERM GOAL #3   Title Patient will tolerate 5 seconds of single leg stance without loss of balance to improve ability to get in and out of shower safely.   Period Weeks   Status Partially Met   PT Males TERM GOAL #4   Title Patient will reduce timed up and go to <11 seconds to reduce fall risk and demonstrate improved transfer/gait ability.   Period Weeks   Status Partially Met               Plan - 07/03/2015 1412    Clinical Impression Statement Patient has fatigue with standing exercises and needs constant VC to have correct posturePatient has loss of balance and needs UE support intermittently thorough out exercise   Pt will benefit from skilled therapeutic intervention in order to improve on the  following deficits Abnormal gait;Pain;Decreased activity tolerance;Decreased endurance;Decreased strength;Decreased balance;Difficulty walking;Decreased mobility   Rehab Potential Good   PT Frequency 2x / week   PT Duration 12 weeks   PT Treatment/Interventions Gait training;Cryotherapy;Futures trader;Therapeutic activities;Therapeutic exercise;Balance training;Neuromuscular re-education;Manual techniques   PT Next Visit Plan strengthening and balance   PT Home Exercise Plan Continue as prescribed at initial evaluation   Consulted and Agree with Plan of Care Patient          G-Codes - Jul 03, 2015 1413    Functional Assessment Tool Used 5x sit to stand, TUG, 6 MW, 10 MW   Functional Limitation Mobility: Walking and moving around   Mobility: Walking and Moving Around Current Status [phone]) At least 20 percent but less than 40 percent impaired, limited or restricted   Mobility: Walking and Moving Around Goal Status [phone]) At least 1 percent but less than 20 percent impaired, limited or restricted      Problem List Patient Active Problem List   Diagnosis Date Noted  . Personality disorder, depressive 05/08/2011  . GERD 12/23/2009  . HYPERGLYCEMIA 12/23/2009  . 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 GASTROENTERITIS, VIRAL 08/01/2007  . HYPOTHYROIDISM 05/23/2007  . HYPOPARATHYROIDISM 08/12/2006  . HYPERLIPIDEMIA 08/12/2006  . CORNEAL DISORDER 08/12/2006  . HYPERTENSION 08/12/2006  . ALLERGIC RHINITIS 08/12/2006  . REACTIVE AIRWAY DISEASE 08/12/2006  . POSTMENOPAUSAL STATUS 08/12/2006  . ROSACEA 08/12/2006  . COUGH, CHRONIC 08/12/2006  . CEREBROVASCULAR ACCIDENT, HX OF 08/12/2006  . MIGRAINES, HX OF 08/12/2006   Alanson Puls, PT, DPT [place], [place] S 07/03/15, 2:15 PM  [place] MAIN [facility] SERVICES [address] [place], [place], [address] Phone: [phone]   Fax:  [phone]  Name: Brenda Barber MRN: [id number] Date of Birth: 03/21/1956

## 2015-06-22 ENCOUNTER — Encounter: Payer: Self-pay | Admitting: Physical Therapy

## 2015-06-22 ENCOUNTER — Ambulatory Visit: Payer: Medicare Other | Attending: Neurology | Admitting: Physical Therapy

## 2015-06-22 DIAGNOSIS — R262 Difficulty in walking, not elsewhere classified: Secondary | ICD-10-CM | POA: Diagnosis present

## 2015-06-22 DIAGNOSIS — M25561 Pain in right knee: Secondary | ICD-10-CM | POA: Diagnosis present

## 2015-06-22 DIAGNOSIS — R531 Weakness: Secondary | ICD-10-CM | POA: Diagnosis present

## 2015-06-22 NOTE — Therapy (Signed)
[place] MAIN [facility] SERVICES [address] [place], [place], [address] Phone: [phone]   Fax:  [phone]  Physical Therapy Treatment  Patient Details  Name: Brenda Barber MRN: [id number] Date of Birth: [birth year] Referring Provider: Dr. Helayne Seminole  Encounter Date: 06/22/2015      PT End of Session - 06/22/15 1410    Visit Number 11   Number of Visits 25   Date for PT Re-Evaluation August 31, 2015   Authorization Type g codes   PT Start Time 0145   PT Stop Time 0230   PT Time Calculation (min) 45 min   Equipment Utilized During Treatment Gait belt   Activity Tolerance No increased pain;Patient limited by pain   Behavior During Therapy [facility] for tasks assessed/performed      Past Medical History  Diagnosis Date  . Allergy   . Hyperlipidemia   . Hypertension   . Thyroid disease     hypothyroid   . Hypoparathyroidism ([place])   . GERD (gastroesophageal reflux disease)   . Cerebrovascular accident [facility])     History of visual deficit  . Depression   . Personality disorder, depressive   . Thyroid cancer ([place]) 1999    Papillary    Past Surgical History  Procedure Laterality Date  . Cannot tolerate pap / virginal    . Appendectomy    . Thyroidectomy    . Tonsillectomy    . Mri brain  12/1999    There were no vitals filed for this visit.  Visit Diagnosis:  Difficulty walking  Weakness  Right knee pain      Subjective Assessment - 06/22/15 1409    Subjective Patient says that she is not haiving pain in her right knee, she is wearing a brace today.    Currently in Pain? No/denies   Pain Onset More than a month ago    Neuromuscular Re-education    Marching in place on blue foam pad x 30 seconds for 2 sets   Tandem standing in // bars  x 1 minute Step ups to blue foam pad x 10 bilaterally for 2 sets bilaterally   Heel raises from 2 feet transitioning to TTWB on one foot. X 12 bilaterally for 1 set Sit to stand training x5  repetitions for 3 sets Four square stepping.side to side, front and back and diagonals Feet together on blue foam and holding ball x 1 minute, holding ball and fwd and bwd movement elbow flex/ext x 20, horizontal abd/add with ball and arms extended. balloon toss at parallel bars x 5 minutes  Therapeutic exercise; Leg press x 20 x 3 130 lbs, heel raises with 90 lbs x 20 x 3 Heel raises standing  4 way hip RTB x 20 Patient needs occasional verbal cueing to improve posture and cueing to correctly perform exercises slowly, holding at end of range to increase motor firing of desired muscle to encourage fatigue.                              PT Education - 06/22/15 1410    Education provided Yes   Education Details HEP   Person(s) Educated Patient   Methods Explanation   Comprehension Verbalized understanding             PT [place] Term Goals - 06/20/15 1413    PT [place] TERM GOAL #1   Title Patient will report a worst pain of  3/10 on VAS in   right knee          to improve tolerance with ADLs and reduced symptoms with activities   Status Partially Met   PT [place] TERM GOAL #2   Title Patient will be able to perform household work/ chores without increase in symptoms.   Status Achieved   PT Scho[place] TERM GOAL #3   Title Patient will tolerate 5 seconds of single leg stance without loss of balance to improve ability to get in and out of shower safely.   Period Weeks   Status Partially Met   PT [place] TERM GOAL #4   Title Patient will reduce timed up and go to <11 seconds to reduce fall risk and demonstrate improved transfer/gait ability.   Period Weeks   Status Partially Met               Plan - 06/22/15 1410    Clinical Impression Statement  Fatigue with sit to stand but demonstrating more control, Increase weight for standing exercises. Fatigue still evident with cross trainer and endurance.    Pt will benefit from skilled therapeutic intervention in order to  improve on the following deficits Abnormal gait;Pain;Decreased activity tolerance;Decreased endurance;Decreased strength;Decreased balance;Difficulty walking;Decreased mobility   Rehab Potential Good   PT Frequency 2x / week   PT Duration 12 weeks   PT Treatment/Interventions Gait training;Cryotherapy;Futures trader;Therapeutic activities;Therapeutic exercise;Balance training;Neuromuscular re-education;Manual techniques   PT Next Visit Plan strengthening and balance   PT Home Exercise Plan Continue as prescribed at initial evaluation   Consulted and Agree with Plan of Care Patient        Problem List Patient Active Problem List   Diagnosis Date Noted  . Personality disorder, depressive 05/08/2011  . GERD 12/23/2009  . HYPERGLYCEMIA 12/23/2009  . 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 GASTROENTERITIS, VIRAL 08/01/2007  . HYPOTHYROIDISM 05/23/2007  . HYPOPARATHYROIDISM 08/12/2006  . HYPERLIPIDEMIA 08/12/2006  . CORNEAL DISORDER 08/12/2006  . HYPERTENSION 08/12/2006  . ALLERGIC RHINITIS 08/12/2006  . REACTIVE AIRWAY DISEASE 08/12/2006  . POSTMENOPAUSAL STATUS 08/12/2006  . ROSACEA 08/12/2006  . COUGH, CHRONIC 08/12/2006  . CEREBROVASCULAR ACCIDENT, HX OF 08/12/2006  . MIGRAINES, HX OF 08/12/2006   Alanson Puls, PT, DPT [place], [place] S 06/22/2015, 2:12 PM  [place] MAIN [facility] SERVICES [address] [place], [place], [address] Phone: [phone]   Fax:  [phone]  Name: SCARLETT PORTLOCK MRN: [id number] Date of Birth: 1955-11-10

## 2015-06-27 ENCOUNTER — Ambulatory Visit: Payer: Medicare Other | Admitting: Physical Therapy

## 2015-06-29 ENCOUNTER — Encounter: Payer: Self-pay | Admitting: Physical Therapy

## 2015-06-29 ENCOUNTER — Ambulatory Visit: Payer: Medicare Other | Admitting: Physical Therapy

## 2015-06-29 DIAGNOSIS — R262 Difficulty in walking, not elsewhere classified: Secondary | ICD-10-CM

## 2015-06-29 DIAGNOSIS — R531 Weakness: Secondary | ICD-10-CM

## 2015-06-29 DIAGNOSIS — M25561 Pain in right knee: Secondary | ICD-10-CM

## 2015-06-29 NOTE — Therapy (Signed)
[place] MAIN [facility] SERVICES [address] [place], [place], [address] Phone: [phone]   Fax:  [phone]  Physical Therapy Treatment  Patient Details  Name: Brenda Barber MRN: [id number] Date of Birth: Feb 28, 1956 Referring Provider: Dr. Helayne Seminole  Encounter Date: 06/29/2015      PT End of Session - 06/29/15 1358    Visit Number 12   Number of Visits 25   Date for PT Re-Evaluation 08/10/2015   Authorization Type g codes   PT Start Time 0145   PT Stop Time 0230   PT Time Calculation (min) 45 min   Equipment Utilized During Treatment Gait belt   Activity Tolerance No increased pain;Patient limited by pain   Behavior During Therapy [facility] for tasks assessed/performed      Past Medical History  Diagnosis Date  . Allergy   . Hyperlipidemia   . Hypertension   . Thyroid disease     hypothyroid   . Hypoparathyroidism ([place])   . GERD (gastroesophageal reflux disease)   . Cerebrovascular accident [facility])     History of visual deficit  . Depression   . Personality disorder, depressive   . Thyroid cancer ([place]) 1999    Papillary    Past Surgical History  Procedure Laterality Date  . Cannot tolerate pap / virginal    . Appendectomy    . Thyroidectomy    . Tonsillectomy    . Mri brain  12/1999    There were no vitals filed for this visit.  Visit Diagnosis:  Difficulty walking  Weakness  Right knee pain      Subjective Assessment - 06/29/15 1358    Subjective Patient says that she is not haiving pain in her right knee, she is not wearing a brace today.    Pain Onset More than a month ago      Quantum double leg press 100 # x 20 Heel raises with UE support and toes on 2x4, 2 x 10; Mini squats with RTB around knees to prevent valgus 2 x 10; RTB side stepping in // bars 4 lengths x 2; Sit to stand without UE support RTB around knees to prevent valgus 2 x 10 Neuromuscular Re-education   Marching in place on blue foam pad x  30 seconds for 2 sets   Tandem standing in // bars  x 1 minute Step ups to blue foam pad x 10 bilaterally for 2 sets bilaterally   Heel raises bilateral feet  Sit to stand training x5 repetitions for 3 sets biodex x 5 reps fwd/bwd/side stepping Four square stepping.side to side, front and back and diagonals Feet together on blue foam and holding ball x 1 minute, holding ball and fwd and bwd movement elbow flex/ext x 20, horizontal abd/add with ball and arms extended. Toe taps on AIREX + step 3x10 no Ue Fwd step up onto 6inch step from AIREX no UE Fwd step up onto 4inch step no UE x 10 each side CGA to min A needed cues for  Patient needs occasional verbal cueing to improve posture and cueing to correctly perform exercises slowly, holding at end of range to increase motor firing of desired muscle to encourage fatigue.                             PT Education - 06/29/15 1358    Education provided Yes   Education Details HEP   Person(s) Educated  Patient   Methods Explanation   Comprehension Verbalized understanding             PT [place] Term Goals - 06/20/15 1413    PT [place] TERM GOAL #1   Title Patient will report a worst pain of 3/10 on VAS in   right knee          to improve tolerance with ADLs and reduced symptoms with activities   Status Partially Met   PT [place] TERM GOAL #2   Title Patient will be able to perform household work/ chores without increase in symptoms.   Status Achieved   PT [place] TERM GOAL #3   Title Patient will tolerate 5 seconds of single leg stance without loss of balance to improve ability to get in and out of shower safely.   Period Weeks   Status Partially Met   PT [place] TERM GOAL #4   Title Patient will reduce timed up and go to <11 seconds to reduce fall risk and demonstrate improved transfer/gait ability.   Period Weeks   Status Partially Met               Plan - 06/29/15 1359    Clinical Impression Statement Muscle  fatigue but no major pain complaints. Patient advancing to red theraband for exercises listed above.   Pt will benefit from skilled therapeutic intervention in order to improve on the following deficits Abnormal gait;Pain;Decreased activity tolerance;Decreased endurance;Decreased strength;Decreased balance;Difficulty walking;Decreased mobility   Rehab Potential Good   PT Frequency 2x / week   PT Duration 12 weeks   PT Treatment/Interventions Gait training;Cryotherapy;Futures trader;Therapeutic activities;Therapeutic exercise;Balance training;Neuromuscular re-education;Manual techniques   PT Next Visit Plan strengthening and balance   PT Home Exercise Plan Continue as prescribed at initial evaluation   Consulted and Agree with Plan of Care Patient        Problem List Patient Active Problem List   Diagnosis Date Noted  . Personality disorder, depressive 05/08/2011  . GERD 12/23/2009  . HYPERGLYCEMIA 12/23/2009  . 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 GASTROENTERITIS, VIRAL 08/01/2007  . HYPOTHYROIDISM 05/23/2007  . HYPOPARATHYROIDISM 08/12/2006  . HYPERLIPIDEMIA 08/12/2006  . CORNEAL DISORDER 08/12/2006  . HYPERTENSION 08/12/2006  . ALLERGIC RHINITIS 08/12/2006  . REACTIVE AIRWAY DISEASE 08/12/2006  . POSTMENOPAUSAL STATUS 08/12/2006  . ROSACEA 08/12/2006  . COUGH, CHRONIC 08/12/2006  . CEREBROVASCULAR ACCIDENT, HX OF 08/12/2006  . MIGRAINES, HX OF 08/12/2006  Alanson Puls, PT, DPT  [place], [place] S 06/29/2015, 2:00 PM  [place] MAIN [facility] SERVICES [address] [place], [place], [address] Phone: [phone]   Fax:  [phone]  Name: Brenda Barber MRN: [id number] Date of Birth: [birth year]

## 2015-07-04 ENCOUNTER — Encounter: Payer: Self-pay | Admitting: Physical Therapy

## 2015-07-04 ENCOUNTER — Ambulatory Visit: Payer: Medicare Other | Admitting: Physical Therapy

## 2015-07-04 DIAGNOSIS — M25561 Pain in right knee: Secondary | ICD-10-CM

## 2015-07-04 DIAGNOSIS — R262 Difficulty in walking, not elsewhere classified: Secondary | ICD-10-CM | POA: Diagnosis not present

## 2015-07-04 DIAGNOSIS — R531 Weakness: Secondary | ICD-10-CM

## 2015-07-04 NOTE — Therapy (Signed)
[place] MAIN [facility] SERVICES [address] [place], [place], [address] Phone: [phone]   Fax:  [phone]  Physical Therapy Treatment  Patient Details  Name: Brenda Barber MRN: [id number] Date of Birth: [birth year] Referring Provider: Dr. Helayne Seminole  Encounter Date: 07/04/2015      PT End of Session - 07/04/15 1342    Visit Number 13   Number of Visits 25   Date for PT Re-Evaluation 2015-08-05   Authorization Type g codes   PT Start Time 0135   PT Stop Time 0215   PT Time Calculation (min) 40 min   Equipment Utilized During Treatment Gait belt   Activity Tolerance No increased pain;Patient limited by pain   Behavior During Therapy [facility] for tasks assessed/performed      Past Medical History  Diagnosis Date  . Allergy   . Hyperlipidemia   . Hypertension   . Thyroid disease     hypothyroid   . Hypoparathyroidism ([place])   . GERD (gastroesophageal reflux disease)   . Cerebrovascular accident [facility])     History of visual deficit  . Depression   . Personality disorder, depressive   . Thyroid cancer ([place]) 1999    Papillary    Past Surgical History  Procedure Laterality Date  . Cannot tolerate pap / virginal    . Appendectomy    . Thyroidectomy    . Tonsillectomy    . Mri brain  12/1999    There were no vitals filed for this visit.  Visit Diagnosis:  Difficulty walking  Weakness  Right knee pain      Subjective Assessment - 07/04/15 1341    Subjective Patient says that she is not haiving pain in her right knee, she is not wearing a brace today.    Currently in Pain? No/denies   Pain Onset More than a month ago      Neuromuscular Re-education   Marching in place on blue foam pad x 30 seconds for 2 sets   Tandem standing in // bars  x 1 minute Step ups to blue foam pad x 10 bilaterally for 2 sets bilaterally   Heel raises bilateral feet  Sit to stand training x5 repetitions for 3 sets Feet together on blue foam  and holding ball x 1 minute, holding ball and fwd and bwd movement elbow flex/ext x 20, horizontal abd/add with ball and arms extended. Toe taps on AIREX + step 3x10 no Ue Fwd step up onto 4inch step no UE x 10 each side CGA to min A needed cues   Therapeutic exercise; Leg press x 20 x 3 130 lbs,  heel raises with 90 lbs x 20 x 3 Heel raises standing  4 way hip RTB x 20 Patient needs occasional verbal cueing to improve posture and cueing to correctly perform exercises slowly, holding at end of range to increase motor firing of desired muscle to encourage fatigue.                             PT Education - 07/04/15 1341    Education provided Yes   Education Details HEP   Person(s) Educated Patient   Methods Explanation   Comprehension Verbalized understanding             PT [place] Term Goals - 06/20/15 1413    PT [place] TERM GOAL #1   Title Patient will report a worst pain of  3/10 on VAS in   right knee          to improve tolerance with ADLs and reduced symptoms with activities   Status Partially Met   PT [place] TERM GOAL #2   Title Patient will be able to perform household work/ chores without increase in symptoms.   Status Achieved   PT [place] TERM GOAL #3   Title Patient will tolerate 5 seconds of single leg stance without loss of balance to improve ability to get in and out of shower safely.   Period Weeks   Status Partially Met   PT [place] TERM GOAL #4   Title Patient will reduce timed up and go to <11 seconds to reduce fall risk and demonstrate improved transfer/gait ability.   Period Weeks   Status Partially Met               Plan - 07/04/15 1342    Clinical Impression Statement Patient has unsteady stepping with several exercises but his motor control improve with practice.    Pt will benefit from skilled therapeutic intervention in order to improve on the following deficits Abnormal gait;Pain;Decreased activity tolerance;Decreased  endurance;Decreased strength;Decreased balance;Difficulty walking;Decreased mobility   Rehab Potential Good   PT Frequency 2x / week   PT Duration 12 weeks   PT Treatment/Interventions Gait training;Cryotherapy;Futures trader;Therapeutic activities;Therapeutic exercise;Balance training;Neuromuscular re-education;Manual techniques   PT Next Visit Plan strengthening and balance   PT Home Exercise Plan Continue as prescribed at initial evaluation   Consulted and Agree with Plan of Care Patient        Problem List Patient Active Problem List   Diagnosis Date Noted  . Personality disorder, depressive 05/08/2011  . GERD 12/23/2009  . HYPERGLYCEMIA 12/23/2009  . 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 GASTROENTERITIS, VIRAL 08/01/2007  . HYPOTHYROIDISM 05/23/2007  . HYPOPARATHYROIDISM 08/12/2006  . HYPERLIPIDEMIA 08/12/2006  . CORNEAL DISORDER 08/12/2006  . HYPERTENSION 08/12/2006  . ALLERGIC RHINITIS 08/12/2006  . REACTIVE AIRWAY DISEASE 08/12/2006  . POSTMENOPAUSAL STATUS 08/12/2006  . ROSACEA 08/12/2006  . COUGH, CHRONIC 08/12/2006  . CEREBROVASCULAR ACCIDENT, HX OF 08/12/2006  . MIGRAINES, HX OF 08/12/2006   Alanson Puls, PT, DPT [place], [place] S 07/04/2015, 1:44 PM  [place] MAIN [facility] SERVICES [address] [place], [place], [address] Phone: [phone]   Fax:  [phone]  Name: Brenda Barber MRN: [id number] Date of Birth: 11-16-1955

## 2015-07-06 ENCOUNTER — Ambulatory Visit: Payer: Medicare Other | Admitting: Physical Therapy

## 2015-07-06 ENCOUNTER — Encounter: Payer: Self-pay | Admitting: Physical Therapy

## 2015-07-06 DIAGNOSIS — M25561 Pain in right knee: Secondary | ICD-10-CM

## 2015-07-06 DIAGNOSIS — R262 Difficulty in walking, not elsewhere classified: Secondary | ICD-10-CM | POA: Diagnosis not present

## 2015-07-06 DIAGNOSIS — R531 Weakness: Secondary | ICD-10-CM

## 2015-07-06 NOTE — Therapy (Signed)
[place] MAIN [facility] SERVICES [address] [place], [place], [address] Phone: [phone]   Fax:  [phone]  Physical Therapy Treatment  Patient Details  Name: Brenda Barber MRN: [id number] Date of Birth: November 05, 1955 Referring Provider: Dr. Helayne Seminole  Encounter Date: 07/06/2015      Brenda End of Session - 07/06/15 1346    Visit Number 14   Number of Visits 25   Date for Brenda Re-Evaluation 01-Sep-2015   Authorization Type g codes   Brenda Start Time 0145   Brenda Stop Time 0230   Brenda Time Calculation (min) 45 min   Equipment Utilized During Treatment Gait belt   Activity Tolerance No increased pain;Patient limited by pain   Behavior During Therapy [name] for tasks assessed/performed      Past Medical History  Diagnosis Date  . Allergy   . Hyperlipidemia   . Hypertension   . Thyroid disease     hypothyroid   . Hypoparathyroidism ([place])   . GERD (gastroesophageal reflux disease)   . Cerebrovascular accident [facility])     History of visual deficit  . Depression   . Personality disorder, depressive   . Thyroid cancer ([place]) 1999    Papillary    Past Surgical History  Procedure Laterality Date  . Cannot tolerate pap / virginal    . Appendectomy    . Thyroidectomy    . Tonsillectomy    . Mri brain  12/1999    There were no vitals filed for this visit.  Visit Diagnosis:  Difficulty walking  Weakness  Right knee pain      Subjective Assessment - 07/06/15 1346    Subjective Patient says that she is not haiving pain in her right knee, she is not wearing a brace today.    Currently in Pain? No/denies   Pain Onset More than a month ago      NEUROMUSCULAR RE-ED Gait in hallway with horizontal and vertical head turns 80' x 2; Standing slow marching without UE support 2 x 10; Toe taps on BOSU x 10 bilateral, alternating; Modified tandem stance eyes open/closed x 30 seconds each alternating LE; Modified tandem stance eyes open with  horizontal and vertical head turns alternating Neuromuscular training: Tandem stand with head turns x 2 minutes Side stepping with head control Stepping onto AIREX, then on to 4 inch step followed by stepping down onto AIREX and then level surface in //bars x15.  Brenda required occasional UE assist, and performance improved with each repetition   Stepping over and back x10 bilaterally   Stepping over and back x`10 bilaterally with verbal cueing to perform exercise as fast as possible Side step and back x10 bilaterally Side step and back x10 bilaterally Min cueing needed to appropriately perform balance  tasks with leg, hand, and head position. Decreased coordination demonstrated requiring consistent verbal cueing to correct form. Patient continues to demonstrate some in coordination of movement with select exercises such as stepping backwards. Patient responds well to verbal and tactile cues to correct form and technique.  CGA to SBA for safety with activities.                           Brenda Education - 07/06/15 1346    Education provided Yes   Education Details HEP   Person(s) Educated Patient   Methods Explanation   Comprehension Verbalized understanding  Brenda Brenda Barber - 06/20/15 1413    Brenda Barber   Title Patient will report a worst pain of 3/10 on VAS in   right knee          to improve tolerance with ADLs and reduced symptoms with activities   Status Partially Met   Brenda Barber   Title Patient will be able to perform household work/ chores without increase in symptoms.   Status Achieved   Brenda Barber   Title Patient will tolerate 5 seconds of single leg stance without loss of balance to improve ability to get in and out of shower safely.   Period Weeks   Status Partially Met   Brenda Barber   Title Patient will reduce timed up and go to <11 seconds to reduce fall risk and demonstrate improved transfer/gait  ability.   Period Weeks   Status Partially Met               Plan - 07/06/15 1346    Clinical Impression Statement Brenda. demonstrated good control and understanding with standing hip 3-way. Minimal cueing needed to point toe forward and keep knee straight with task. SBA needed throughout   Brenda will benefit from skilled therapeutic intervention in order to improve on the following deficits Abnormal gait;Pain;Decreased activity tolerance;Decreased endurance;Decreased strength;Decreased balance;Difficulty walking;Decreased mobility   Rehab Potential Good   Brenda Frequency 2x / week   Brenda Duration 12 weeks   Brenda Treatment/Interventions Gait training;Cryotherapy;Futures trader;Therapeutic activities;Therapeutic exercise;Balance training;Neuromuscular re-education;Manual techniques   Brenda Next Visit Plan strengthening and balance   Brenda Home Exercise Plan Continue as prescribed at initial evaluation   Consulted and Agree with Plan of Care Patient        Problem List Patient Active Problem List   Diagnosis Date Noted  . Personality disorder, depressive 05/08/2011  . GERD 12/23/2009  . HYPERGLYCEMIA 12/23/2009  . 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 GASTROENTERITIS, VIRAL 08/01/2007  . HYPOTHYROIDISM 05/23/2007  . HYPOPARATHYROIDISM 08/12/2006  . HYPERLIPIDEMIA 08/12/2006  . CORNEAL DISORDER 08/12/2006  . HYPERTENSION 08/12/2006  . ALLERGIC RHINITIS 08/12/2006  . REACTIVE AIRWAY DISEASE 08/12/2006  . POSTMENOPAUSAL STATUS 08/12/2006  . ROSACEA 08/12/2006  . COUGH, CHRONIC 08/12/2006  . CEREBROVASCULAR ACCIDENT, HX OF 08/12/2006  . MIGRAINES, HX OF 08/12/2006   Brenda Barber, Brenda, Brenda Barber [place], [place] S 07/06/2015, 1:51 PM  [place] MAIN [facility] SERVICES [address] [place], [place], [address] Phone: [phone]   Fax:  [phone]  Name: Brenda Barber MRN: [id number] Date of Birth:  10/27/1955

## 2015-07-11 ENCOUNTER — Ambulatory Visit: Payer: Medicare Other | Admitting: Physical Therapy

## 2015-07-11 ENCOUNTER — Encounter: Payer: Self-pay | Admitting: Physical Therapy

## 2015-07-11 DIAGNOSIS — R262 Difficulty in walking, not elsewhere classified: Secondary | ICD-10-CM | POA: Diagnosis not present

## 2015-07-11 DIAGNOSIS — R531 Weakness: Secondary | ICD-10-CM

## 2015-07-11 DIAGNOSIS — M25561 Pain in right knee: Secondary | ICD-10-CM

## 2015-07-11 NOTE — Therapy (Signed)
[place] MAIN [facility] SERVICES [address] [place], [place], [address] Phone: [phone]   Fax:  [phone]  Physical Therapy Treatment  Patient Details  Name: Brenda Barber MRN: [id number] Date of Birth: 1956/02/07 Referring Provider: Dr. Helayne Seminole  Encounter Date: 07/11/2015      PT End of Session - 07/11/15 1409    Visit Number 15   Number of Visits 25   Date for PT Re-Evaluation 23-Aug-2015   Authorization Type g codes   PT Start Time 0145   PT Stop Time 0230   PT Time Calculation (min) 45 min   Equipment Utilized During Treatment Gait belt   Activity Tolerance No increased pain;Patient limited by pain   Behavior During Therapy [facility] for tasks assessed/performed      Past Medical History  Diagnosis Date  . Allergy   . Hyperlipidemia   . Hypertension   . Thyroid disease     hypothyroid   . Hypoparathyroidism ([place])   . GERD (gastroesophageal reflux disease)   . Cerebrovascular accident [facility])     History of visual deficit  . Depression   . Personality disorder, depressive   . Thyroid cancer ([place]) 1999    Papillary    Past Surgical History  Procedure Laterality Date  . Cannot tolerate pap / virginal    . Appendectomy    . Thyroidectomy    . Tonsillectomy    . Mri brain  12/1999    There were no vitals filed for this visit.  Visit Diagnosis:  Difficulty walking  Weakness  Right knee pain      Subjective Assessment - 07/11/15 1408    Subjective Patient says that she is not haiving pain in her right knee, she is not wearing a brace today.    Currently in Pain? No/denies   Pain Onset More than a month ago      NEUROMUSCULAR RE-EDUCATION Airex NBOS eyes open/closed x 30 seconds each; Airex NBOS eyes open horizontal and vertical head turns x 30 seconds; Airex cone  reaching crossing midline  Toe tapping 6 inch stool without UE assist Tandem gait in // bars x 4 laps  Side stepping on blue  foam balance beam  x 5 lengths of the parallel bars  4 square fwd/bwd, side to side stepping/ diagonal stepping Standing on foam NBOS  sorting balls/ sorting shapes reaching  Therapeutic exercise; Leg press x 20 x 3 130 lbs,  heel raises with 90 lbs x 20 x 3 Heel raises standing  4 way hip RTB x 20 Patient needs occasional verbal cueing to improve posture and cueing to correctly perform exercises slowly, holding at end of range to increase motor firing of desired muscle to encourage fatigue.                             PT Education - 07/11/15 1408    Education provided Yes   Education Details HEP   Person(s) Educated Patient   Methods Explanation   Comprehension Verbalized understanding             PT Huhn Term Goals - 06/20/15 1413    PT Dibert TERM GOAL #1   Title Patient will report a worst pain of 3/10 on VAS in   right knee          to improve tolerance with ADLs and reduced symptoms with activities   Status Partially Met  PT Hopkin TERM GOAL #2   Title Patient will be able to perform household work/ chores without increase in symptoms.   Status Achieved   PT Cauthorn TERM GOAL #3   Title Patient will tolerate 5 seconds of single leg stance without loss of balance to improve ability to get in and out of shower safely.   Period Weeks   Status Partially Met   PT Amparan TERM GOAL #4   Title Patient will reduce timed up and go to <11 seconds to reduce fall risk and demonstrate improved transfer/gait ability.   Period Weeks   Status Partially Met               Plan - 07/11/15 1409    Clinical Impression Statement Cueing was needed during the leg press to control motion out/back; frequent instances of decreased eccentric control were exhibited. Min cueing needed during hip 3-way to maintain upright posture with knees straight. Pt had good performance of therapeutic exercise today   Pt will benefit from skilled therapeutic intervention in order to improve on the following  deficits Abnormal gait;Pain;Decreased activity tolerance;Decreased endurance;Decreased strength;Decreased balance;Difficulty walking;Decreased mobility   Rehab Potential Good   PT Frequency 2x / week   PT Duration 12 weeks   PT Treatment/Interventions Gait training;Cryotherapy;Futures trader;Therapeutic activities;Therapeutic exercise;Balance training;Neuromuscular re-education;Manual techniques   PT Next Visit Plan strengthening and balance   PT Home Exercise Plan Continue as prescribed at initial evaluation   Consulted and Agree with Plan of Care Patient        Problem List Patient Active Problem List   Diagnosis Date Noted  . Personality disorder, depressive 05/08/2011  . GERD 12/23/2009  . HYPERGLYCEMIA 12/23/2009  . 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 GASTROENTERITIS, VIRAL 08/01/2007  . HYPOTHYROIDISM 05/23/2007  . HYPOPARATHYROIDISM 08/12/2006  . HYPERLIPIDEMIA 08/12/2006  . CORNEAL DISORDER 08/12/2006  . HYPERTENSION 08/12/2006  . ALLERGIC RHINITIS 08/12/2006  . REACTIVE AIRWAY DISEASE 08/12/2006  . POSTMENOPAUSAL STATUS 08/12/2006  . ROSACEA 08/12/2006  . COUGH, CHRONIC 08/12/2006  . CEREBROVASCULAR ACCIDENT, HX OF 08/12/2006  . MIGRAINES, HX OF 08/12/2006   Alanson Puls, PT, DPT [place], [place] S 07/11/2015, 2:11 PM  [place] MAIN [facility] SERVICES [address] [place], [place], [address] Phone: [phone]   Fax:  [phone]  Name: Brenda Barber MRN: [id number] Date of Birth: 30-Aug-1955

## 2015-07-13 ENCOUNTER — Ambulatory Visit: Payer: Medicare Other | Admitting: Physical Therapy

## 2015-07-13 ENCOUNTER — Encounter: Payer: Self-pay | Admitting: Physical Therapy

## 2015-07-13 DIAGNOSIS — R262 Difficulty in walking, not elsewhere classified: Secondary | ICD-10-CM | POA: Diagnosis not present

## 2015-07-13 DIAGNOSIS — M25561 Pain in right knee: Secondary | ICD-10-CM

## 2015-07-13 DIAGNOSIS — R531 Weakness: Secondary | ICD-10-CM

## 2015-07-13 NOTE — Therapy (Signed)
[place] MAIN [facility] SERVICES [address] [place], [place], [address] Phone: [phone]   Fax:  [phone]  Physical Therapy Treatment  Patient Details  Name: Brenda Barber MRN: [id number] Date of Birth: April 29, 1956 Referring Provider: Dr. Helayne Seminole  Encounter Date: 07/13/2015      PT End of Session - 07/13/15 1350    Visit Number 16   Number of Visits 25   Date for PT Re-Evaluation August 27, 2015   Authorization Type g codes   PT Start Time 0140   PT Stop Time 0220   PT Time Calculation (min) 40 min   Equipment Utilized During Treatment Gait belt   Activity Tolerance No increased pain;Patient limited by pain   Behavior During Therapy [facility] for tasks assessed/performed      Past Medical History  Diagnosis Date  . Allergy   . Hyperlipidemia   . Hypertension   . Thyroid disease     hypothyroid   . Hypoparathyroidism ([place])   . GERD (gastroesophageal reflux disease)   . Cerebrovascular accident [facility])     History of visual deficit  . Depression   . Personality disorder, depressive   . Thyroid cancer ([place]) 1999    Papillary    Past Surgical History  Procedure Laterality Date  . Cannot tolerate pap / virginal    . Appendectomy    . Thyroidectomy    . Tonsillectomy    . Mri brain  12/1999    There were no vitals filed for this visit.  Visit Diagnosis:  Difficulty walking  Weakness  Right knee pain      Subjective Assessment - 07/13/15 1350    Subjective Patient says that she is not haiving pain in her right knee, she is not wearing a brace today.    Currently in Pain? No/denies   Pain Onset More than a month ago       Neuromuscular Re-education   Marching in place on blue foam pad x 30 seconds for 2 sets   Tandem standing in // bars  x 1 minute Step ups to blue foam pad x 10 bilaterally for 2 sets bilaterally   Heel raises bilateral feet  Sit to stand training x5 repetitions for 3 sets Toe taps on AIREX + step  3x10 no UE Fwd step up onto 6inch step from AIREX no UE Fwd step up onto 4inch step no UE x 10 each side CGA to min A needed  THER-EX; Quantum double leg press 100 # x 20 x 3 Heel raises with UE support and toes on 2x4, 2 x 10; Mini squats x 20 RTB side stepping in // bars 4 lengths x 2; Sit to stand without UE support RTB  Patient needs occasional verbal cueing to improve posture and cueing to correctly perform exercises slowly, holding at end of range to increase motor firing of desired muscle to encourage fatigue.                             PT Education - 07/13/15 1350    Education provided Yes   Education Details HEP   Person(s) Educated Patient   Methods Explanation   Comprehension Verbalized understanding             PT Heyde Term Goals - 06/20/15 1413    PT Tubby TERM GOAL #1   Title Patient will report a worst pain of 3/10 on VAS in  right knee          to improve tolerance with ADLs and reduced symptoms with activities   Status Partially Met   PT Dacquisto TERM GOAL #2   Title Patient will be able to perform household work/ chores without increase in symptoms.   Status Achieved   PT Sikorski TERM GOAL #3   Title Patient will tolerate 5 seconds of single leg stance without loss of balance to improve ability to get in and out of shower safely.   Period Weeks   Status Partially Met   PT Bassin TERM GOAL #4   Title Patient will reduce timed up and go to <11 seconds to reduce fall risk and demonstrate improved transfer/gait ability.   Period Weeks   Status Partially Met               Plan - 07/13/15 1351    Clinical Impression Statement Verbal cues needed to perform exercises in full ROM. Pain in knees limited her to seated strengthening exercises today   Pt will benefit from skilled therapeutic intervention in order to improve on the following deficits Abnormal gait;Pain;Decreased activity tolerance;Decreased endurance;Decreased strength;Decreased  balance;Difficulty walking;Decreased mobility   Rehab Potential Good   PT Frequency 2x / week   PT Duration 12 weeks   PT Treatment/Interventions Gait training;Cryotherapy;Futures trader;Therapeutic activities;Therapeutic exercise;Balance training;Neuromuscular re-education;Manual techniques   PT Next Visit Plan strengthening and balance   PT Home Exercise Plan Continue as prescribed at initial evaluation   Consulted and Agree with Plan of Care Patient        Problem List Patient Active Problem List   Diagnosis Date Noted  . Personality disorder, depressive 05/08/2011  . GERD 12/23/2009  . HYPERGLYCEMIA 12/23/2009  . 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 GASTROENTERITIS, VIRAL 08/01/2007  . HYPOTHYROIDISM 05/23/2007  . HYPOPARATHYROIDISM 08/12/2006  . HYPERLIPIDEMIA 08/12/2006  . CORNEAL DISORDER 08/12/2006  . HYPERTENSION 08/12/2006  . ALLERGIC RHINITIS 08/12/2006  . REACTIVE AIRWAY DISEASE 08/12/2006  . POSTMENOPAUSAL STATUS 08/12/2006  . ROSACEA 08/12/2006  . COUGH, CHRONIC 08/12/2006  . CEREBROVASCULAR ACCIDENT, HX OF 08/12/2006  . MIGRAINES, HX OF 08/12/2006   Alanson Puls, PT, DPT [place], [place] S 07/13/2015, 1:53 PM  [place] [facility] MAIN [facility] SERVICES [address] [place], [place], [address] Phone: [phone]   Fax:  [phone]  Name: Brenda Barber MRN: [id number] Date of Birth: 04-22-1956

## 2015-07-18 ENCOUNTER — Encounter: Payer: Self-pay | Admitting: Physical Therapy

## 2015-07-18 ENCOUNTER — Ambulatory Visit: Payer: Medicare Other | Admitting: Physical Therapy

## 2015-07-18 DIAGNOSIS — M25561 Pain in right knee: Secondary | ICD-10-CM

## 2015-07-18 DIAGNOSIS — R262 Difficulty in walking, not elsewhere classified: Secondary | ICD-10-CM | POA: Diagnosis not present

## 2015-07-18 DIAGNOSIS — R531 Weakness: Secondary | ICD-10-CM

## 2015-07-18 NOTE — Therapy (Signed)
[place] MAIN [facility] SERVICES [address] [place], [place], [address] Phone: [phone]   Fax:  [phone]  Physical Therapy Treatment  Patient Details  Name: Brenda Barber MRN: [id number] Date of Birth: 08-Mar-1956 Referring Provider: Dr. Helayne Seminole  Encounter Date: 07/18/2015      PT End of Session - 07/18/15 1349    Visit Number 17   Number of Visits 25   Date for PT Re-Evaluation 12-Aug-2015   Authorization Type g codes   PT Start Time 0145   PT Stop Time 0230   PT Time Calculation (min) 45 min   Equipment Utilized During Treatment Gait belt   Activity Tolerance No increased pain;Patient limited by pain   Behavior During Therapy [facility] for tasks assessed/performed      Past Medical History  Diagnosis Date  . Allergy   . Hyperlipidemia   . Hypertension   . Thyroid disease     hypothyroid   . Hypoparathyroidism ([place])   . GERD (gastroesophageal reflux disease)   . Cerebrovascular accident [facility])     History of visual deficit  . Depression   . Personality disorder, depressive   . Thyroid cancer ([place]) 1999    Papillary    Past Surgical History  Procedure Laterality Date  . Cannot tolerate pap / virginal    . Appendectomy    . Thyroidectomy    . Tonsillectomy    . Mri brain  12/1999    There were no vitals filed for this visit.  Visit Diagnosis:  Difficulty walking  Weakness  Right knee pain      Subjective Assessment - 07/18/15 1349    Subjective Patient says that she is not haiving pain in her right knee, she is not wearing a brace today.    Currently in Pain? No/denies   Pain Onset More than a month ago      Patient needs occasional verbal cueing to improve posture and cueing to correctly perform exercises slowly, holding at end of range to increase motor firing of desired muscle to encourage fatigue.  THER-EX Nustep L3 x 5 minutes for warm-up during history (3 minutes unbilled); Quantum double leg press  105# x 10, 120# x 10, 135# x 10; Heel raises with UE support and toes on 2x4, 2 x 10; Mini squats with RTB around knees to prevent valgus 2 x 10; RTB side stepping in // bars 4 lengths x 2; Sit to stand without UE support RTB around knees to prevent valgus 2 x 10;  NEUROMUSCULAR RE-ED Gait in hallway with horizontal and vertical head turns 80' x 2; Standing slow marching without UE support 2 x 10; Toe taps on BOSU x 10 bilateral, alternating; Modified tandem stance eyes open/closed x 30 seconds each alternating LE; Modified tandem stance eyes open with horizontal and vertical head turns alternating LE                           PT Education - 07/18/15 1349    Education provided Yes   Education Details HEP   Person(s) Educated Patient   Methods Explanation   Comprehension Verbalized understanding             PT Lanuza Term Goals - 06/20/15 1413    PT Xiang TERM GOAL #1   Title Patient will report a worst pain of 3/10 on VAS in   right knee  to improve tolerance with ADLs and reduced symptoms with activities   Status Partially Met   PT Reisen TERM GOAL #2   Title Patient will be able to perform household work/ chores without increase in symptoms.   Status Achieved   PT Mcelhannon TERM GOAL #3   Title Patient will tolerate 5 seconds of single leg stance without loss of balance to improve ability to get in and out of shower safely.   Period Weeks   Status Partially Met   PT Munter TERM GOAL #4   Title Patient will reduce timed up and go to <11 seconds to reduce fall risk and demonstrate improved transfer/gait ability.   Period Weeks   Status Partially Met               Plan - 07/18/15 1350    Clinical Impression Statement Fatigue with sit to stand but demonstrating more control, Increase weight for standing exercises. Fatigue still evident with cross trainer and endurance.    Pt will benefit from skilled therapeutic intervention in order to improve  on the following deficits Abnormal gait;Pain;Decreased activity tolerance;Decreased endurance;Decreased strength;Decreased balance;Difficulty walking;Decreased mobility   Rehab Potential Good   PT Frequency 2x / week   PT Duration 12 weeks   PT Treatment/Interventions Gait training;Cryotherapy;Futures trader;Therapeutic activities;Therapeutic exercise;Balance training;Neuromuscular re-education;Manual techniques   PT Next Visit Plan strengthening and balance   PT Home Exercise Plan Continue as prescribed at initial evaluation   Consulted and Agree with Plan of Care Patient        Problem List Patient Active Problem List   Diagnosis Date Noted  . Personality disorder, depressive 05/08/2011  . GERD 12/23/2009  . HYPERGLYCEMIA 12/23/2009  . 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 GASTROENTERITIS, VIRAL 08/01/2007  . HYPOTHYROIDISM 05/23/2007  . HYPOPARATHYROIDISM 08/12/2006  . HYPERLIPIDEMIA 08/12/2006  . CORNEAL DISORDER 08/12/2006  . HYPERTENSION 08/12/2006  . ALLERGIC RHINITIS 08/12/2006  . REACTIVE AIRWAY DISEASE 08/12/2006  . POSTMENOPAUSAL STATUS 08/12/2006  . ROSACEA 08/12/2006  . COUGH, CHRONIC 08/12/2006  . CEREBROVASCULAR ACCIDENT, HX OF 08/12/2006  . MIGRAINES, HX OF 08/12/2006   Alanson Puls, PT, DPT [place], [place] S 07/18/2015, 2:04 PM  [place] MAIN [facility] SERVICES [address] [place], [place], [address] Phone: [phone]   Fax:  [phone]  Name: Brenda Barber MRN: [id number] Date of Birth: 08/11/1955

## 2015-07-20 ENCOUNTER — Ambulatory Visit: Payer: Medicare Other | Attending: Neurology | Admitting: Physical Therapy

## 2015-07-20 ENCOUNTER — Encounter: Payer: Self-pay | Admitting: Physical Therapy

## 2015-07-20 DIAGNOSIS — R262 Difficulty in walking, not elsewhere classified: Secondary | ICD-10-CM | POA: Diagnosis present

## 2015-07-20 DIAGNOSIS — R531 Weakness: Secondary | ICD-10-CM | POA: Diagnosis present

## 2015-07-20 DIAGNOSIS — M25561 Pain in right knee: Secondary | ICD-10-CM

## 2015-07-20 NOTE — Therapy (Signed)
[place] MAIN [facility] SERVICES [address] [place], [place], [address] Phone: [phone]   Fax:  [phone]  Physical Therapy Treatment/Discharge summary  Patient Details  Name: Brenda Barber MRN: [id number] Date of Birth: 1955-11-05 Referring Provider: Dr. Helayne Seminole  Encounter Date: 07/20/2015      PT End of Session - 07/20/15 1353    Visit Number 18   Number of Visits 25   Date for PT Re-Evaluation 2015/08/30   Authorization Type g codes   PT Start Time 0145   PT Stop Time 0230   PT Time Calculation (min) 45 min   Equipment Utilized During Treatment Gait belt   Activity Tolerance No increased pain;Patient limited by pain   Behavior During Therapy [facility] for tasks assessed/performed      Past Medical History  Diagnosis Date  . Allergy   . Hyperlipidemia   . Hypertension   . Thyroid disease     hypothyroid   . Hypoparathyroidism ([place])   . GERD (gastroesophageal reflux disease)   . Cerebrovascular accident [facility])     History of visual deficit  . Depression   . Personality disorder, depressive   . Thyroid cancer ([place]) 1999    Papillary    Past Surgical History  Procedure Laterality Date  . Cannot tolerate pap / virginal    . Appendectomy    . Thyroidectomy    . Tonsillectomy    . Mri brain  12/1999    There were no vitals filed for this visit.  Visit Diagnosis:  Difficulty walking  Weakness  Right knee pain      Subjective Assessment - 07/20/15 1352    Subjective Patient says that she is not haiving pain in her right knee, she is not wearing a brace today.    Currently in Pain? No/denies   Pain Onset More than a month ago     OUTCOME MEASURES: TEST Outcome Interpretation  5 times sit<>stand 21 sec >60 yo, >15 sec indicates increased risk for falls  10 meter walk test  .90 m/s <1.0 m/s indicates increased risk for falls; limited community ambulator  Timed up and Go  16.40  sec <14 sec indicates increased risk for falls  6 minute walk test  1000 Feet 1000 feet is community ambulator                 THER-EX Nustep L3 x 5 minutes  Quantum double leg press 100 x 15 x 3 Heel raises with UE support and toes on 2x4, 2 x 10; Mini squats with RTB around knees to prevent valgus 2 x 10; RTB side stepping in // bars 4 lengths x 2; Sit to stand without UE support RTB around knees to prevent valgus 2 x 10;  NEUROMUSCULAR RE-ED Gait in hallway with horizontal and vertical head turns 80' x 2; Standing slow marching without UE support 2 x 10; Toe taps on BOSU x 10 bilateral, alternating; Modified tandem stance eyes open/closed x 30 seconds each alternating LE; Modified tandem stance eyes open with horizontal and vertical head turns alternating LE  Min  verbal cues used throughout with increased in postural sway and LOB most seen with narrow base of support and while on uneven surfaces. Continues to have balance deficits typical with diagnosis. Patient performs intermediate level exercises without pain behaviors and needs verbal cuing for postural alignment and head positioning.  PT Education - 07/20/15 1353    Education provided Yes   Education Details HEP   Person(s) Educated Patient   Methods Explanation   Comprehension Verbalized understanding             PT Harkins Term Goals - 06/20/15 1413    PT Crunk TERM GOAL #1   Title Patient will report a worst pain of 3/10 on VAS in   right knee          to improve tolerance with ADLs and reduced symptoms with activities   Status Partially Met   PT Jenson TERM GOAL #2   Title Patient will be able to perform household work/ chores without increase in symptoms.   Status Achieved   PT Grunow TERM GOAL #3   Title Patient will tolerate 5 seconds of single leg stance without loss of balance to improve ability to get in and out of shower safely.    Period Weeks   Status Partially Met   PT Stefanick TERM GOAL #4   Title Patient will reduce timed up and go to <11 seconds to reduce fall risk and demonstrate improved transfer/gait ability.   Period Weeks   Status Partially Met               Plan - 07/20/15 1354    Clinical Impression Statement  Fatigue with sit to stand but demonstrating more control, Increase weight for standing exercises. Fatigue still evident with cross trainer and endurance. Patient has reached her goals and is ready to be DCed.   Pt will benefit from skilled therapeutic intervention in order to improve on the following deficits Abnormal gait;Pain;Decreased activity tolerance;Decreased endurance;Decreased strength;Decreased balance;Difficulty walking;Decreased mobility   Rehab Potential Good   PT Frequency 2x / week   PT Duration 12 weeks   PT Treatment/Interventions Gait training;Cryotherapy;Futures trader;Therapeutic activities;Therapeutic exercise;Balance training;Neuromuscular re-education;Manual techniques   PT Next Visit Plan strengthening and balance   PT Home Exercise Plan Continue as prescribed at initial evaluation   Consulted and Agree with Plan of Care Patient        Problem List Patient Active Problem List   Diagnosis Date Noted  . Personality disorder, depressive 05/08/2011  . GERD 12/23/2009  . HYPERGLYCEMIA 12/23/2009  . 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 GASTROENTERITIS, VIRAL 08/01/2007  . HYPOTHYROIDISM 05/23/2007  . HYPOPARATHYROIDISM 08/12/2006  . HYPERLIPIDEMIA 08/12/2006  . CORNEAL DISORDER 08/12/2006  . HYPERTENSION 08/12/2006  . ALLERGIC RHINITIS 08/12/2006  . REACTIVE AIRWAY DISEASE 08/12/2006  . POSTMENOPAUSAL STATUS 08/12/2006  . ROSACEA 08/12/2006  . COUGH, CHRONIC 08/12/2006  . CEREBROVASCULAR ACCIDENT, HX OF 08/12/2006  . MIGRAINES, HX OF 08/12/2006   Alanson Puls, PT, DPT [place], [place] S 07/20/2015, 1:56 PM  [place] MAIN [facility] SERVICES [address] [place], [place], [address] Phone: [phone]   Fax:  [phone]  Name: Brenda Barber MRN: [id number] Date of Birth: Jun 14, 1955

## 2015-08-22 DIAGNOSIS — R079 Chest pain, unspecified: Secondary | ICD-10-CM | POA: Diagnosis not present

## 2015-08-26 DIAGNOSIS — I4891 Unspecified atrial fibrillation: Secondary | ICD-10-CM | POA: Diagnosis not present

## 2015-08-26 DIAGNOSIS — E785 Hyperlipidemia, unspecified: Secondary | ICD-10-CM | POA: Diagnosis not present

## 2015-08-26 DIAGNOSIS — I10 Essential (primary) hypertension: Secondary | ICD-10-CM | POA: Diagnosis not present

## 2015-08-26 DIAGNOSIS — I2510 Atherosclerotic heart disease of native coronary artery without angina pectoris: Secondary | ICD-10-CM | POA: Diagnosis not present

## 2015-08-26 DIAGNOSIS — R079 Chest pain, unspecified: Secondary | ICD-10-CM | POA: Diagnosis not present

## 2015-09-02 ENCOUNTER — Other Ambulatory Visit: Payer: Self-pay | Admitting: *Deleted

## 2015-09-02 DIAGNOSIS — R918 Other nonspecific abnormal finding of lung field: Secondary | ICD-10-CM

## 2015-09-06 ENCOUNTER — Ambulatory Visit: Payer: Commercial Managed Care - HMO

## 2015-09-07 ENCOUNTER — Other Ambulatory Visit: Payer: Self-pay

## 2015-09-08 ENCOUNTER — Ambulatory Visit
Admission: RE | Admit: 2015-09-08 | Discharge: 2015-09-08 | Disposition: A | Discharge status: Home / Self Care | Payer: Commercial Managed Care - HMO | Source: Ambulatory Visit | Attending: Cardiothoracic Surgery | Admitting: Cardiothoracic Surgery

## 2015-09-08 ENCOUNTER — Inpatient Hospital Stay: Payer: Commercial Managed Care - HMO | Admitting: Cardiothoracic Surgery

## 2015-09-08 DIAGNOSIS — I2510 Atherosclerotic heart disease of native coronary artery without angina pectoris: Secondary | ICD-10-CM | POA: Diagnosis not present

## 2015-09-08 DIAGNOSIS — R918 Other nonspecific abnormal finding of lung field: Secondary | ICD-10-CM | POA: Diagnosis not present

## 2015-09-09 ENCOUNTER — Encounter: Payer: Self-pay | Admitting: Cardiothoracic Surgery

## 2015-09-09 ENCOUNTER — Ambulatory Visit (INDEPENDENT_AMBULATORY_CARE_PROVIDER_SITE_OTHER): Payer: Commercial Managed Care - HMO | Admitting: Cardiothoracic Surgery

## 2015-09-09 VITALS — BP 155/94 | HR 69 | Temp 98.9°F | Ht 63.0 in | Wt 154.4 lb

## 2015-09-09 DIAGNOSIS — R911 Solitary pulmonary nodule: Secondary | ICD-10-CM

## 2015-09-09 NOTE — Patient Instructions (Signed)
Please call our office if you have questions or concerns.   

## 2015-09-09 NOTE — Progress Notes (Signed)
[facility] Note  Patient ID: RAELYNNE LUDWICK, female   DOB: 1956-01-05, 60 y.o.   MRN: [id number]  HISTORY: This patient returns today in follow-up of her multiple bilateral pulmonary nodules. She states she's been getting along quite well. She does not complain of any shortness of breath. She denied any fevers or chills.   Filed Vitals:   09/09/15 0951  BP: 155/94  Pulse: 69  Temp: 98.9 F (37.2 C)     EXAM: Resp: Lungs are clear bilaterally.  No respiratory distress, normal effort. Heart:  Regular without murmurs Abd:  Abdomen is soft, non distended and non tender. No masses are palpable.  There is no rebound and no guarding.  Neurological: Alert and oriented to person, place, and time. Coordination normal.  Skin: Skin is warm and dry. No rash noted. No diaphoretic. No erythema. No pallor.  Psychiatric: Normal mood and affect. Normal behavior. Judgment and thought content normal.   She has a well-healed thyroidectomy scar from her thyroid cancer.   ASSESSMENT: I have independently reviewed her CT scans and compared them to her scans from 2008. There has been no significant recent change in the multiple bilateral pulmonary nodules many of these nodules were also present back in 2008.   PLAN:   We will see the patient back again in 2 years time. We will obtain a CT scan the chest without contrast.    Nestor Lewandowsky, MD

## 2015-09-12 ENCOUNTER — Other Ambulatory Visit: Payer: Self-pay

## 2015-09-22 DIAGNOSIS — G4733 Obstructive sleep apnea (adult) (pediatric): Secondary | ICD-10-CM | POA: Diagnosis not present

## 2015-09-22 DIAGNOSIS — E890 Postprocedural hypothyroidism: Secondary | ICD-10-CM | POA: Diagnosis not present

## 2015-09-22 DIAGNOSIS — Z7689 Persons encountering health services in other specified circumstances: Secondary | ICD-10-CM | POA: Diagnosis not present

## 2015-09-22 DIAGNOSIS — R296 Repeated falls: Secondary | ICD-10-CM | POA: Diagnosis not present

## 2015-09-22 DIAGNOSIS — G935 Compression of brain: Secondary | ICD-10-CM | POA: Diagnosis not present

## 2015-09-22 DIAGNOSIS — E892 Postprocedural hypoparathyroidism: Secondary | ICD-10-CM | POA: Diagnosis not present

## 2015-09-29 DIAGNOSIS — D6489 Other specified anemias: Secondary | ICD-10-CM | POA: Diagnosis not present

## 2015-09-29 DIAGNOSIS — E890 Postprocedural hypothyroidism: Secondary | ICD-10-CM | POA: Diagnosis not present

## 2015-09-29 DIAGNOSIS — R2689 Other abnormalities of gait and mobility: Secondary | ICD-10-CM | POA: Diagnosis not present

## 2015-09-29 DIAGNOSIS — I480 Paroxysmal atrial fibrillation: Secondary | ICD-10-CM | POA: Diagnosis not present

## 2015-09-29 DIAGNOSIS — Z85850 Personal history of malignant neoplasm of thyroid: Secondary | ICD-10-CM | POA: Diagnosis not present

## 2015-09-29 DIAGNOSIS — G4733 Obstructive sleep apnea (adult) (pediatric): Secondary | ICD-10-CM | POA: Diagnosis not present

## 2015-09-29 DIAGNOSIS — G935 Compression of brain: Secondary | ICD-10-CM | POA: Diagnosis not present

## 2015-09-29 DIAGNOSIS — R51 Headache: Secondary | ICD-10-CM | POA: Diagnosis not present

## 2015-09-29 DIAGNOSIS — R918 Other nonspecific abnormal finding of lung field: Secondary | ICD-10-CM | POA: Diagnosis not present

## 2015-10-16 DIAGNOSIS — J0100 Acute maxillary sinusitis, unspecified: Secondary | ICD-10-CM | POA: Diagnosis not present

## 2015-10-20 DIAGNOSIS — G4733 Obstructive sleep apnea (adult) (pediatric): Secondary | ICD-10-CM | POA: Diagnosis not present

## 2015-10-20 DIAGNOSIS — R0609 Other forms of dyspnea: Secondary | ICD-10-CM | POA: Diagnosis not present

## 2015-10-20 DIAGNOSIS — R05 Cough: Secondary | ICD-10-CM | POA: Diagnosis not present

## 2015-10-24 DIAGNOSIS — R569 Unspecified convulsions: Secondary | ICD-10-CM | POA: Diagnosis not present

## 2015-10-24 DIAGNOSIS — S52591A Other fractures of lower end of right radius, initial encounter for closed fracture: Secondary | ICD-10-CM | POA: Diagnosis not present

## 2015-10-24 DIAGNOSIS — M25531 Pain in right wrist: Secondary | ICD-10-CM | POA: Diagnosis not present

## 2015-10-24 DIAGNOSIS — G935 Compression of brain: Secondary | ICD-10-CM | POA: Diagnosis not present

## 2015-10-24 DIAGNOSIS — G4733 Obstructive sleep apnea (adult) (pediatric): Secondary | ICD-10-CM | POA: Diagnosis not present

## 2015-10-24 DIAGNOSIS — Z8669 Personal history of other diseases of the nervous system and sense organs: Secondary | ICD-10-CM | POA: Diagnosis not present

## 2015-10-24 DIAGNOSIS — E890 Postprocedural hypothyroidism: Secondary | ICD-10-CM | POA: Diagnosis not present

## 2015-10-24 DIAGNOSIS — R51 Headache: Secondary | ICD-10-CM | POA: Diagnosis not present

## 2015-10-24 DIAGNOSIS — I480 Paroxysmal atrial fibrillation: Secondary | ICD-10-CM | POA: Diagnosis not present

## 2015-10-24 DIAGNOSIS — Z9181 History of falling: Secondary | ICD-10-CM | POA: Diagnosis not present

## 2015-10-25 DIAGNOSIS — R51 Headache: Secondary | ICD-10-CM | POA: Diagnosis not present

## 2015-10-25 DIAGNOSIS — R296 Repeated falls: Secondary | ICD-10-CM | POA: Diagnosis not present

## 2015-10-25 DIAGNOSIS — G935 Compression of brain: Secondary | ICD-10-CM | POA: Diagnosis not present

## 2015-11-03 DIAGNOSIS — S52614A Nondisplaced fracture of right ulna styloid process, initial encounter for closed fracture: Secondary | ICD-10-CM | POA: Diagnosis not present

## 2015-11-03 DIAGNOSIS — S52591A Other fractures of lower end of right radius, initial encounter for closed fracture: Secondary | ICD-10-CM | POA: Diagnosis not present

## 2015-11-03 DIAGNOSIS — M25531 Pain in right wrist: Secondary | ICD-10-CM | POA: Diagnosis not present

## 2015-11-14 DIAGNOSIS — S52501A Unspecified fracture of the lower end of right radius, initial encounter for closed fracture: Secondary | ICD-10-CM | POA: Diagnosis not present

## 2015-11-23 DIAGNOSIS — M1611 Unilateral primary osteoarthritis, right hip: Secondary | ICD-10-CM | POA: Diagnosis not present

## 2015-11-23 DIAGNOSIS — S52591G Other fractures of lower end of right radius, subsequent encounter for closed fracture with delayed healing: Secondary | ICD-10-CM | POA: Diagnosis not present

## 2015-11-23 DIAGNOSIS — M25551 Pain in right hip: Secondary | ICD-10-CM | POA: Diagnosis not present

## 2015-11-23 DIAGNOSIS — S52591A Other fractures of lower end of right radius, initial encounter for closed fracture: Secondary | ICD-10-CM | POA: Diagnosis not present

## 2015-12-01 DIAGNOSIS — M1611 Unilateral primary osteoarthritis, right hip: Secondary | ICD-10-CM | POA: Diagnosis not present

## 2016-01-02 DIAGNOSIS — R296 Repeated falls: Secondary | ICD-10-CM | POA: Diagnosis not present

## 2016-01-02 DIAGNOSIS — G935 Compression of brain: Secondary | ICD-10-CM | POA: Diagnosis not present

## 2016-01-02 DIAGNOSIS — R51 Headache: Secondary | ICD-10-CM | POA: Diagnosis not present

## 2016-01-13 ENCOUNTER — Emergency Department: Payer: Commercial Managed Care - HMO

## 2016-01-13 ENCOUNTER — Observation Stay
Admission: EM | Admit: 2016-01-13 | Discharge: 2016-01-17 | Disposition: A | Discharge status: Home / Self Care | Payer: Commercial Managed Care - HMO | Attending: Internal Medicine | Admitting: Internal Medicine

## 2016-01-13 DIAGNOSIS — K5660 Unspecified intestinal obstruction: Secondary | ICD-10-CM | POA: Insufficient documentation

## 2016-01-13 DIAGNOSIS — Z23 Encounter for immunization: Secondary | ICD-10-CM | POA: Insufficient documentation

## 2016-01-13 DIAGNOSIS — Z9889 Other specified postprocedural states: Secondary | ICD-10-CM | POA: Insufficient documentation

## 2016-01-13 DIAGNOSIS — J4530 Mild persistent asthma, uncomplicated: Secondary | ICD-10-CM | POA: Diagnosis not present

## 2016-01-13 DIAGNOSIS — K529 Noninfective gastroenteritis and colitis, unspecified: Secondary | ICD-10-CM | POA: Diagnosis not present

## 2016-01-13 DIAGNOSIS — E039 Hypothyroidism, unspecified: Secondary | ICD-10-CM | POA: Diagnosis not present

## 2016-01-13 DIAGNOSIS — J9811 Atelectasis: Secondary | ICD-10-CM | POA: Insufficient documentation

## 2016-01-13 DIAGNOSIS — E876 Hypokalemia: Secondary | ICD-10-CM | POA: Insufficient documentation

## 2016-01-13 DIAGNOSIS — F609 Personality disorder, unspecified: Secondary | ICD-10-CM | POA: Insufficient documentation

## 2016-01-13 DIAGNOSIS — R109 Unspecified abdominal pain: Secondary | ICD-10-CM | POA: Diagnosis not present

## 2016-01-13 DIAGNOSIS — E785 Hyperlipidemia, unspecified: Secondary | ICD-10-CM | POA: Diagnosis not present

## 2016-01-13 DIAGNOSIS — Z85850 Personal history of malignant neoplasm of thyroid: Secondary | ICD-10-CM | POA: Insufficient documentation

## 2016-01-13 DIAGNOSIS — I10 Essential (primary) hypertension: Secondary | ICD-10-CM | POA: Insufficient documentation

## 2016-01-13 DIAGNOSIS — K567 Ileus, unspecified: Secondary | ICD-10-CM | POA: Diagnosis not present

## 2016-01-13 DIAGNOSIS — R112 Nausea with vomiting, unspecified: Secondary | ICD-10-CM | POA: Diagnosis not present

## 2016-01-13 DIAGNOSIS — K219 Gastro-esophageal reflux disease without esophagitis: Secondary | ICD-10-CM | POA: Insufficient documentation

## 2016-01-13 DIAGNOSIS — K5730 Diverticulosis of large intestine without perforation or abscess without bleeding: Secondary | ICD-10-CM | POA: Diagnosis not present

## 2016-01-13 DIAGNOSIS — Z9049 Acquired absence of other specified parts of digestive tract: Secondary | ICD-10-CM | POA: Diagnosis not present

## 2016-01-13 DIAGNOSIS — Z803 Family history of malignant neoplasm of breast: Secondary | ICD-10-CM | POA: Insufficient documentation

## 2016-01-13 DIAGNOSIS — Z833 Family history of diabetes mellitus: Secondary | ICD-10-CM | POA: Insufficient documentation

## 2016-01-13 DIAGNOSIS — Z888 Allergy status to other drugs, medicaments and biological substances status: Secondary | ICD-10-CM | POA: Diagnosis not present

## 2016-01-13 DIAGNOSIS — G4733 Obstructive sleep apnea (adult) (pediatric): Secondary | ICD-10-CM | POA: Insufficient documentation

## 2016-01-13 DIAGNOSIS — R911 Solitary pulmonary nodule: Secondary | ICD-10-CM | POA: Diagnosis not present

## 2016-01-13 DIAGNOSIS — Z8673 Personal history of transient ischemic attack (TIA), and cerebral infarction without residual deficits: Secondary | ICD-10-CM | POA: Insufficient documentation

## 2016-01-13 DIAGNOSIS — R1110 Vomiting, unspecified: Secondary | ICD-10-CM

## 2016-01-13 DIAGNOSIS — F329 Major depressive disorder, single episode, unspecified: Secondary | ICD-10-CM | POA: Diagnosis not present

## 2016-01-13 DIAGNOSIS — Z886 Allergy status to analgesic agent status: Secondary | ICD-10-CM | POA: Diagnosis not present

## 2016-01-13 DIAGNOSIS — I480 Paroxysmal atrial fibrillation: Secondary | ICD-10-CM | POA: Insufficient documentation

## 2016-01-13 DIAGNOSIS — R1032 Left lower quadrant pain: Secondary | ICD-10-CM | POA: Diagnosis not present

## 2016-01-13 LAB — PROTIME-INR
INR: 1.03
Prothrombin Time: 13.5 seconds (ref 11.4–15.2)

## 2016-01-13 LAB — COMPREHENSIVE METABOLIC PANEL
ALT: 22 U/L (ref 14–54)
AST: 22 U/L (ref 15–41)
Albumin: 4.1 g/dL (ref 3.5–5.0)
Alkaline Phosphatase: 91 U/L (ref 38–126)
Anion gap: 9 (ref 5–15)
BUN: 27 mg/dL — ABNORMAL HIGH (ref 6–20)
CHLORIDE: 103 mmol/L (ref 101–111)
CO2: 32 mmol/L (ref 22–32)
Calcium: 9.2 mg/dL (ref 8.9–10.3)
Creatinine, Ser: 1.62 mg/dL — ABNORMAL HIGH (ref 0.44–1.00)
GFR calc non Af Amer: 33 mL/min — ABNORMAL LOW (ref >60)
GFR, EST AFRICAN AMERICAN: 39 mL/min — AB (ref >60)
Glucose, Bld: 117 mg/dL — ABNORMAL HIGH (ref 65–99)
Potassium: 3.4 mmol/L — ABNORMAL LOW (ref 3.5–5.1)
SODIUM: 144 mmol/L (ref 135–145)
Total Bilirubin: 0.5 mg/dL (ref 0.3–1.2)
Total Protein: 7.5 g/dL (ref 6.5–8.1)

## 2016-01-13 LAB — URINALYSIS COMPLETE WITH MICROSCOPIC (ARMC ONLY)
BACTERIA UA: NONE SEEN
Bilirubin Urine: NEGATIVE
Glucose, UA: NEGATIVE mg/dL
HGB URINE DIPSTICK: NEGATIVE
Ketones, ur: NEGATIVE mg/dL
Nitrite: NEGATIVE
PH: 5 (ref 5.0–8.0)
Protein, ur: 100 mg/dL — AB
SPECIFIC GRAVITY, URINE: 1.028 (ref 1.005–1.030)

## 2016-01-13 LAB — CBC
HCT: 37.6 % (ref 35.0–47.0)
HEMOGLOBIN: 12.4 g/dL (ref 12.0–16.0)
MCH: 28.6 pg (ref 26.0–34.0)
MCHC: 33 g/dL (ref 32.0–36.0)
MCV: 86.7 fL (ref 80.0–100.0)
PLATELETS: 248 10*3/uL (ref 150–440)
RBC: 4.34 MIL/uL (ref 3.80–5.20)
RDW: 15.5 % — ABNORMAL HIGH (ref 11.5–14.5)
WBC: 10.1 10*3/uL (ref 3.6–11.0)

## 2016-01-13 LAB — APTT: aPTT: 39 seconds — ABNORMAL HIGH (ref 24–36)

## 2016-01-13 LAB — TROPONIN I: Troponin I: <0.03 ng/mL (ref <0.03)

## 2016-01-13 LAB — LACTIC ACID, PLASMA: LACTIC ACID, VENOUS: 1.4 mmol/L (ref 0.5–1.9)

## 2016-01-13 LAB — LIPASE, BLOOD: LIPASE: 49 U/L (ref 11–51)

## 2016-01-13 MED ORDER — ALBUTEROL SULFATE (2.5 MG/3ML) 0.083% IN NEBU
5.0000 mg | INHALATION_SOLUTION | Freq: Once | RESPIRATORY_TRACT | Status: AC
  Administered 2016-01-13: 5 mg via RESPIRATORY_TRACT

## 2016-01-13 MED ORDER — ONDANSETRON HCL 4 MG/2ML IJ SOLN
INTRAMUSCULAR | Status: AC
  Administered 2016-01-13: 4 mg via INTRAVENOUS
  Filled 2016-01-13: qty 2

## 2016-01-13 MED ORDER — ONDANSETRON HCL 4 MG/2ML IJ SOLN
4.0000 mg | Freq: Once | INTRAMUSCULAR | Status: AC
  Administered 2016-01-13: 4 mg via INTRAVENOUS

## 2016-01-13 MED ORDER — FENTANYL CITRATE (PF) 100 MCG/2ML IJ SOLN
INTRAMUSCULAR | Status: AC
  Filled 2016-01-13: qty 2

## 2016-01-13 MED ORDER — ALBUTEROL SULFATE (2.5 MG/3ML) 0.083% IN NEBU
INHALATION_SOLUTION | RESPIRATORY_TRACT | Status: AC
  Filled 2016-01-13: qty 3

## 2016-01-13 MED ORDER — FENTANYL CITRATE (PF) 100 MCG/2ML IJ SOLN
50.0000 ug | Freq: Once | INTRAMUSCULAR | Status: AC
  Administered 2016-01-13: 50 ug via INTRAVENOUS

## 2016-01-13 MED ORDER — FENTANYL CITRATE (PF) 100 MCG/2ML IJ SOLN
INTRAMUSCULAR | Status: AC
  Administered 2016-01-13: 50 ug via INTRAVENOUS
  Filled 2016-01-13: qty 2

## 2016-01-13 MED ORDER — SODIUM CHLORIDE 0.9 % IV BOLUS (SEPSIS)
500.0000 mL | Freq: Once | INTRAVENOUS | Status: AC
  Administered 2016-01-13: 500 mL via INTRAVENOUS

## 2016-01-13 MED ORDER — DIATRIZOATE MEGLUMINE & SODIUM 66-10 % PO SOLN
15.0000 mL | Freq: Once | ORAL | Status: AC
  Administered 2016-01-13: 15 mL via ORAL

## 2016-01-13 MED ORDER — IOPAMIDOL (ISOVUE-300) INJECTION 61%
75.0000 mL | Freq: Once | INTRAVENOUS | Status: AC | PRN
  Administered 2016-01-13: 75 mL via INTRAVENOUS
  Filled 2016-01-13: qty 75

## 2016-01-13 MED ORDER — SODIUM CHLORIDE 0.9 % IV BOLUS (SEPSIS): 500.0000 mL | Freq: Once | INTRAVENOUS | Status: DC

## 2016-01-13 MED ORDER — FENTANYL CITRATE (PF) 100 MCG/2ML IJ SOLN: 50.0000 ug | Freq: Once | INTRAMUSCULAR | Status: DC

## 2016-01-13 NOTE — ED Notes (Signed)
Patient was placed on 2L O2 via [place] for desats to low 80's while sleeping. Patient was easily awakened and instructed to breathe through nose and sats rose to 96 on 2L O2 via [place].

## 2016-01-13 NOTE — H&P (Signed)
[facility] NAME: Brenda Barber    MR#:  [id number]  DATE OF BIRTH:  01/10/1956  DATE OF ADMISSION:  01/13/2016  PRIMARY CARE PHYSICIAN: [name], MD   REQUESTING/REFERRING PHYSICIAN: Burlene Arnt, MD  CHIEF COMPLAINT:  No chief complaint on file.   HISTORY OF PRESENT ILLNESS:  Brenda Barber  is a 60 y.o. female who presents with Abdominal pain. Patient states this morning. She states that it is a lower abdominal pain, was waxing and waning in nature, never went completely and at its worst was severe in intensity. She states that she has had some constipation recently, but she states she did have a bowel movement earlier today. Here in the ED CT scan showed developing partial small bowel obstruction versus enteritis. General surgery was contacted by ED physician, and also reviewed the scan and feels that the patient does not have small bowel obstruction. Patient has had associated nausea, and one episode of vomiting. She denies any blood in her stool or emesis. Hospitals were called for admission for observation  PAST MEDICAL HISTORY:   Past Medical History:  Diagnosis Date  . Allergy   . Cerebrovascular accident [facility])    History of visual deficit  . Depression   . GERD (gastroesophageal reflux disease)   . Hyperlipidemia   . Hypertension   . Hypoparathyroidism ([place])   . Personality disorder, depressive   . Thyroid cancer ([place]) 1999   Papillary  . Thyroid disease    hypothyroid     PAST SURGICAL HISTORY:   Past Surgical History:  Procedure Laterality Date  . APPENDECTOMY    . CANNOT TOLERATE PAP / Virginal    . MRI brain  12/1999  . THYROIDECTOMY    . TONSILLECTOMY      SOCIAL HISTORY:   Social History  Substance Use Topics  . Smoking status: Never Smoker  . Smokeless tobacco: Never Used  . Alcohol use No    FAMILY HISTORY:   Family History  Problem Relation Age of Onset  . Diabetes Father   . Diabetes  Paternal Aunt   . Diabetes Paternal Uncle   . Breast cancer Sister 78    DRUG ALLERGIES:   Allergies  Allergen Reactions  . Aspirin-Dipyridamole Er     REACTION: Headache  . Rosuvastatin     REACTION: Not effective    MEDICATIONS AT HOME:   Prior to Admission medications   Medication Sig Start Date End Date Taking? Authorizing Provider  ADVAIR DISKUS 250-50 MCG/DOSE AEPB Place 2 puffs into the nose as needed. 06/16/15   Historical Provider, MD  albuterol (PROVENTIL HFA;VENTOLIN HFA) 108 (90 BASE) MCG/ACT inhaler Inhale 2 puffs into the lungs 4 (four) times daily as needed for wheezing or shortness of breath.    Historical Provider, MD  amiodarone (PACERONE) 200 MG tablet Take 1 tablet (200 mg total) by mouth daily. 10/25/14   Ahmed Prima, MD  atenolol (TENORMIN) 50 MG tablet Take 50 mg by mouth daily.      Historical Provider, MD  atorvastatin (LIPITOR) 80 MG tablet Take 1 tablet by mouth daily. 06/12/15   Historical Provider, MD  calcitRIOL (ROCALTROL) 0.25 MCG capsule Take 2 capsules every morning and 1 capsule every evening.     Historical Provider, MD  clopidogrel (PLAVIX) 75 MG tablet Take 1 tablet by mouth daily. 04/21/15 04/20/16  Historical Provider, MD  fluticasone (VERAMYST) 27.5 MCG/SPRAY nasal spray Place 1 spray into the nose daily.  Historical Provider, MD  levothyroxine (SYNTHROID, LEVOTHROID) 150 MCG tablet Take 1 tablet by mouth 1 day or 1 dose. 09/04/15   Historical Provider, MD  levothyroxine (SYNTHROID, LEVOTHROID) 200 MCG tablet Take 200 mcg by mouth daily.      Historical Provider, MD  loratadine (CLARITIN) 10 MG tablet Take 10 mg by mouth daily.    Historical Provider, MD  metoprolol (LOPRESSOR) 50 MG tablet Take 1 tablet by mouth daily. 05/30/15   Historical Provider, MD  mometasone (NASONEX) 50 MCG/ACT nasal spray Place 2 sprays into the nose daily as needed.      Historical Provider, MD  montelukast (SINGULAIR) 10 MG tablet Take 10 mg by mouth at bedtime.     Historical Provider, MD  nitrofurantoin, macrocrystal-monohydrate, (MACROBID) 100 MG capsule Take 1 capsule (100 mg total) by mouth once. 03/12/15   Ahmed Prima, MD  nitroGLYCERIN (NITROSTAT) 0.4 MG SL tablet Place 1 tablet under the tongue as needed.    Historical Provider, MD  nortriptyline (PAMELOR) 10 MG capsule Take 2 capsules by mouth at bedtime. 08/25/15   Historical Provider, MD  ranitidine (ZANTAC) 150 MG tablet Take 150 mg by mouth 2 (two) times daily.      Historical Provider, MD  simvastatin (ZOCOR) 20 MG tablet Take 20 mg by mouth at bedtime.      Historical Provider, MD  traMADol (ULTRAM) 50 MG tablet Take 2 tablets (100 mg total) by mouth once. 03/12/15   Ahmed Prima, MD  traZODone (DESYREL) 50 MG tablet Take 50 mg by mouth at bedtime.      Historical Provider, MD    REVIEW OF SYSTEMS:  Review of Systems  Constitutional: Negative for chills, fever, malaise/fatigue and weight loss.  HENT: Negative for ear pain, hearing loss and tinnitus.   Eyes: Negative for blurred vision, double vision, pain and redness.  Respiratory: Negative for cough, hemoptysis and shortness of breath.   Cardiovascular: Negative for chest pain, palpitations, orthopnea and leg swelling.  Gastrointestinal: Positive for abdominal pain, nausea and vomiting. Negative for constipation and diarrhea.  Genitourinary: Negative for dysuria, frequency and hematuria.  Musculoskeletal: Negative for back pain, joint pain and neck pain.  Skin:       No acne, rash, or lesions  Neurological: Negative for dizziness, tremors, focal weakness and weakness.  Endo/Heme/Allergies: Negative for polydipsia. Does not bruise/bleed easily.  Psychiatric/Behavioral: Negative for depression. The patient is not nervous/anxious and does not have insomnia.      VITAL SIGNS:   Vitals:   01/13/16 1758 01/13/16 1759 01/13/16 2226 01/13/16 2245  BP: (!) 138/91  (!) 167/105 (!) 169/98  Pulse: 74  98 86  Resp: '18  18 16  '$ Temp:  97.9 F (36.6 C)     TempSrc: Oral     SpO2: 94%  94% 96%  Weight:  72.6 kg (160 lb)    Height:  '5\' 3"'$  (1.6 m)     Wt Readings from Last 3 Encounters:  01/13/16 72.6 kg (160 lb)  09/09/15 70 kg (154 lb 6.4 oz)  03/12/15 71.2 kg (157 lb)    PHYSICAL EXAMINATION:  Physical Exam  Vitals reviewed. Constitutional: She is oriented to person, place, and time. She appears well-developed and well-nourished. No distress.  HENT:  Head: Normocephalic and atraumatic.  Mouth/Throat: Oropharynx is clear and moist.  Eyes: Conjunctivae and EOM are normal. Pupils are equal, round, and reactive to light. No scleral icterus.  Neck: Normal range of motion. Neck supple. No JVD present.  No thyromegaly present.  Cardiovascular: Normal rate, regular rhythm and intact distal pulses.  Exam reveals no gallop and no friction rub.   No murmur heard. Respiratory: Effort normal and breath sounds normal. No respiratory distress. She has no wheezes. She has no rales.  GI: Soft. Bowel sounds are normal. She exhibits no distension. There is tenderness (bilateral lower quadrants).  Musculoskeletal: Normal range of motion. She exhibits no edema.  No arthritis, no gout  Lymphadenopathy:    She has no cervical adenopathy.  Neurological: She is alert and oriented to person, place, and time. No cranial nerve deficit.  No dysarthria, no aphasia  Skin: Skin is warm and dry. No rash noted. No erythema.  Psychiatric: She has a normal mood and affect. Her behavior is normal. Judgment and thought content normal.    LABORATORY PANEL:   CBC  Recent Labs Lab 01/13/16 1809  WBC 10.1  HGB 12.4  HCT 37.6  PLT 248   ------------------------------------------------------------------------------------------------------------------  Chemistries   Recent Labs Lab 01/13/16 1809  NA 144  K 3.4*  CL 103  CO2 32  GLUCOSE 117*  BUN 27*  CREATININE 1.62*  CALCIUM 9.2  AST 22  ALT 22  ALKPHOS 91  BILITOT 0.5    ------------------------------------------------------------------------------------------------------------------  Cardiac Enzymes  Recent Labs Lab 01/13/16 1809  TROPONINI <0.03   ------------------------------------------------------------------------------------------------------------------  RADIOLOGY:  Ct Abdomen Pelvis W Contrast  Result Date: 01/13/2016 CLINICAL DATA:  Epigastric and left upper quadrant abdominal pain. History of diverticulitis. Unknown chronicity. EXAM: CT ABDOMEN AND PELVIS WITH CONTRAST TECHNIQUE: Multidetector CT imaging of the abdomen and pelvis was performed using the standard protocol following bolus administration of intravenous contrast. CONTRAST:  82m ISOVUE-300 IOPAMIDOL (ISOVUE-300) INJECTION 61% COMPARISON:  10/19/2013 FINDINGS: Atelectasis in the lung bases. 7 mm nodule in the left lung base demonstrates slight enlargement since previous study. Non-contrast chest CT at 6-12 months is recommended. If the nodule is stable at time of repeat CT, then future CT at 18-24 months (from today's scan) is considered optional for low-risk patients, but is recommended for high-risk patients. This recommendation follows the consensus statement: Guidelines for Management of Incidental Pulmonary Nodules Detected on CT Images:From the Fleischner Society 2017; published online before print (10.1148/[id number]. The liver, spleen, gallbladder, pancreas, adrenal glands, kidneys, inferior vena cava, and retroperitoneal lymph nodes are unremarkable. Scattered calcifications in the abdominal aorta without aneurysm. Stomach is unremarkable. Mildly dilated jejunal small bowel with small bowel feces sign present. Multiple mid abdominal small bowel loops distal to the dilated loops demonstrate wall thickening with infiltration in the adjacent mesentery and small mesenteric fluid collections. Small amount of free fluid in the pelvis. Changes suggest early or partial small bowel  obstruction and associated bowel wall thickening and edema could indicate coexisting infection short time tori enteritis versus localized ischemic change. No free air in the abdomen. No pneumatosis. Mesenteric arteries and veins appear patent. Colon is decompressed. Pelvis: Diverticulosis of the sigmoid colon. No definite evidence of diverticulitis although the presence of fluid in the pelvis could obscure inflammatory change. Appendix is normal. No pelvic mass or lymphadenopathy. Uterus and ovaries are not enlarged. Bladder wall is not thickened. Degenerative changes in the spine and hips. IMPRESSION: Mildly dilated small bowel. Small bowel wall thickening and mesenteric edema. Changes likely to represent her partial small bowel obstruction, possibly associated with enteritis or skin change. **An incidental finding of potential clinical significance has been found. 7 mm left lung base nodule. Non-contrast chest CT at 6-12 months is recommended.  If the nodule is stable at time of repeat CT, then future CT at 18-24 months (from today's scan) is considered optional for low-risk patients, but is recommended for high-risk patients. This recommendation follows the consensus statement: Guidelines for Management of Incidental Pulmonary Nodules Detected on CT Images:From the Fleischner Society 2017; published online before print (10.1148/[id number]).** These results were called by telephone at the time of interpretation on 01/13/2016 at 10:57 pm to Dr. Charlotte Crumb , who verbally acknowledged these results. Electronically Signed   By: Lucienne Capers M.D.   On: 01/13/2016 23:01    EKG:   Orders placed or performed during the hospital encounter of 01/13/16  . EKG 12-Lead  . EKG 12-Lead  . ED EKG  . ED EKG    IMPRESSION AND PLAN:  Principal Problem:   Abdominal pain - when necessary pain control and antiemetics tonight, nothing by mouth for now. We'll hold off on anything like an NG tube for now. Surgery  consult to follow along as they are resolve this patient. Active Problems:   Essential hypertension - currently stable, continue home meds   Paroxysmal atrial fibrillation ([place]) - continue home medications for this, currently rate controlled sinus rhythm   Obstructive apnea - CPAP daily at bedtime   Hypothyroidism - home dose thyroid replacement   GERD - home dose H2 blocker  All the records are reviewed and case discussed with ED provider. Management plans discussed with the patient and/or family.  DVT PROPHYLAXIS: SubQ heparin  GI PROPHYLAXIS: H2 blocker  ADMISSION STATUS: Observation  CODE STATUS: Full Code Status History    This patient does not have a recorded code status. Please follow your organizational policy for patients in this situation.      TOTAL TIME TAKING CARE OF THIS PATIENT: 40 minutes.    Lili Harts [place] 01/13/2016, 11:44 PM  [facility]  Office  [phone]  CC: Primary care physician; [name], MD

## 2016-01-13 NOTE — ED Notes (Signed)
Due to being unable to obtain IV access at this time, fluids and medications are cancelled.

## 2016-01-13 NOTE — ED Triage Notes (Signed)
Pt c/o upper abd pain intermittent for the past several months and today the pain worsened.. Denies N/V/D.Marland Kitchen

## 2016-01-13 NOTE — ED Notes (Signed)
Two IV attempts and two IV attempts by ultrasound which were unsuccessful. MD aware.

## 2016-01-13 NOTE — ED Notes (Signed)
Patient vomited x1 prior to zofran administration.

## 2016-01-13 NOTE — ED Provider Notes (Addendum)
[facility] Emergency Department Provider Note  ____________________________________________   I have reviewed the triage vital signs and the nursing notes.   HISTORY  Chief Complaint No chief complaint on file.    HPI Brenda Barber is a 60 y.o. female who has a history of diverticulitis, is a very poor historian presents today with epigastric and left upper quadrant abdominal pain which she thinks is similar to prior diverticulitis. She denies any nausea or vomiting. She states that she's had this pain off and on for "a while" but cannot specify for how [place]. She is unable to tell me any aggravating or alleviating symptoms. She denies any chest pain or short of breath. She's visibly states it is not worse when she ambulates. She denies any diarrhea. Last bowel movement was recently she states than normal. She has not vomited. The pain has gotten gradually worse over the last few days it appears.      Past Medical History:  Diagnosis Date  . Allergy   . Cerebrovascular accident [facility])    History of visual deficit  . Depression   . GERD (gastroesophageal reflux disease)   . Hyperlipidemia   . Hypertension   . Hypoparathyroidism ([place])   . Personality disorder, depressive   . Thyroid cancer ([place]) 1999   Papillary  . Thyroid disease    hypothyroid     Patient Active Problem List   Diagnosis Date Noted  . Arnold-Chiari malformation, type I ([place]) 06/01/2015  . Paroxysmal atrial fibrillation ([place]) 06/01/2015  . Excessive falling 04/21/2015  . Seizure ([place]) 03/16/2015  . Transient alteration of awareness 03/16/2015  . Postprocedural hypoparathyroidism ([place]) 10/31/2014  . H/O malignant neoplasm of thyroid 04/26/2014  . Hypothyroidism, postop 04/26/2014  . Obstructive apnea 04/19/2014  . Lung nodule, multiple 11/09/2013  . Personality disorder, depressive 05/08/2011  . GERD 12/23/2009  . HYPERGLYCEMIA 12/23/2009  . 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  GASTROENTERITIS, VIRAL 08/01/2007  . HYPOTHYROIDISM 05/23/2007  . HYPOPARATHYROIDISM 08/12/2006  . HYPERLIPIDEMIA 08/12/2006  . CORNEAL DISORDER 08/12/2006  . HYPERTENSION 08/12/2006  . ALLERGIC RHINITIS 08/12/2006  . REACTIVE AIRWAY DISEASE 08/12/2006  . POSTMENOPAUSAL STATUS 08/12/2006  . ROSACEA 08/12/2006  . COUGH, CHRONIC 08/12/2006  . CEREBROVASCULAR ACCIDENT, HX OF 08/12/2006  . MIGRAINES, HX OF 08/12/2006    Past Surgical History:  Procedure Laterality Date  . APPENDECTOMY    . CANNOT TOLERATE PAP / Virginal    . MRI brain  12/1999  . THYROIDECTOMY    . TONSILLECTOMY      Prior to Admission medications   Medication Sig Start Date End Date Taking? Authorizing Provider  ADVAIR DISKUS 250-50 MCG/DOSE AEPB Place 2 puffs into the nose as needed. 06/16/15   Historical Provider, MD  albuterol (PROVENTIL HFA;VENTOLIN HFA) 108 (90 BASE) MCG/ACT inhaler Inhale 2 puffs into the lungs 4 (four) times daily as needed for wheezing or shortness of breath.    Historical Provider, MD  amiodarone (PACERONE) 200 MG tablet Take 1 tablet (200 mg total) by mouth daily. 10/25/14   Ahmed Prima, MD  atenolol (TENORMIN) 50 MG tablet Take 50 mg by mouth daily.      Historical Provider, MD  atorvastatin (LIPITOR) 80 MG tablet Take 1 tablet by mouth daily. 06/12/15   Historical Provider, MD  calcitRIOL (ROCALTROL) 0.25 MCG capsule Take 2 capsules every morning and 1 capsule every evening.     Historical Provider, MD  clopidogrel (PLAVIX) 75 MG tablet Take 1 tablet by mouth daily.  04/21/15 04/20/16  Historical Provider, MD  fluticasone (VERAMYST) 27.5 MCG/SPRAY nasal spray Place 1 spray into the nose daily.    Historical Provider, MD  levothyroxine (SYNTHROID, LEVOTHROID) 150 MCG tablet Take 1 tablet by mouth 1 day or 1 dose. 09/04/15   Historical Provider, MD  levothyroxine (SYNTHROID, LEVOTHROID) 200 MCG tablet Take 200 mcg by mouth daily.      Historical Provider, MD  loratadine (CLARITIN) 10 MG tablet  Take 10 mg by mouth daily.    Historical Provider, MD  metoprolol (LOPRESSOR) 50 MG tablet Take 1 tablet by mouth daily. 05/30/15   Historical Provider, MD  mometasone (NASONEX) 50 MCG/ACT nasal spray Place 2 sprays into the nose daily as needed.      Historical Provider, MD  montelukast (SINGULAIR) 10 MG tablet Take 10 mg by mouth at bedtime.    Historical Provider, MD  nitrofurantoin, macrocrystal-monohydrate, (MACROBID) 100 MG capsule Take 1 capsule (100 mg total) by mouth once. 03/12/15   Ahmed Prima, MD  nitroGLYCERIN (NITROSTAT) 0.4 MG SL tablet Place 1 tablet under the tongue as needed.    Historical Provider, MD  nortriptyline (PAMELOR) 10 MG capsule Take 2 capsules by mouth at bedtime. 08/25/15   Historical Provider, MD  ranitidine (ZANTAC) 150 MG tablet Take 150 mg by mouth 2 (two) times daily.      Historical Provider, MD  simvastatin (ZOCOR) 20 MG tablet Take 20 mg by mouth at bedtime.      Historical Provider, MD  traMADol (ULTRAM) 50 MG tablet Take 2 tablets (100 mg total) by mouth once. 03/12/15   Ahmed Prima, MD  traZODone (DESYREL) 50 MG tablet Take 50 mg by mouth at bedtime.      Historical Provider, MD    Allergies Aspirin-dipyridamole er and Rosuvastatin  Family History  Problem Relation Age of Onset  . Diabetes Father   . Diabetes Paternal Aunt   . Diabetes Paternal Uncle   . Breast cancer Sister 83    Social History Social History  Substance Use Topics  . Smoking status: Never Smoker  . Smokeless tobacco: Never Used  . Alcohol use No    Review of Systems Constitutional: No fever/chills Eyes: No visual changes. ENT: No sore throat. No stiff neck no neck pain Cardiovascular: Denies chest pain. Respiratory: Denies shortness of breath. Gastrointestinal:   no vomiting.  No diarrhea.  No constipation. Genitourinary: Negative for dysuria. Musculoskeletal: Negative lower extremity swelling Skin: Negative for rash. Neurological: Negative for severe  headaches, focal weakness or numbness. 10-point ROS otherwise negative.  ____________________________________________   PHYSICAL EXAM:  VITAL SIGNS: ED Triage Vitals  Enc Vitals Group     BP 01/13/16 1758 (!) 138/91     Pulse Rate 01/13/16 1758 74     Resp 01/13/16 1758 18     Temp 01/13/16 1758 97.9 F (36.6 C)     Temp Source 01/13/16 1758 Oral     SpO2 01/13/16 1758 94 %     Weight 01/13/16 1759 160 lb (72.6 kg)     Height 01/13/16 1759 '5\' 3"'$  (1.6 m)     Head Circumference --      Peak Flow --      Pain Score --      Pain Loc --      Pain Edu? --      Excl. in [place]? --     Constitutional: Alert and oriented. Well appearing and in no acute distress. Eyes: Conjunctivae are normal. PERRL. EOMI.  Head: Atraumatic. Nose: No congestion/rhinnorhea. Mouth/Throat: Mucous membranes are moist.  Oropharynx non-erythematous. Neck: No stridor.   Nontender with no meningismus Cardiovascular: Normal rate, regular rhythm. Grossly normal heart sounds.  Good peripheral circulation. Respiratory: Normal respiratory effort.  No retractions. Lungs CTAB. Abdominal: Soft positive palpation in the left upper quadrant which reproduces her pain. No distention. No guarding no rebound Back:  There is no focal tenderness or step off.  there is no midline tenderness there are no lesions noted. there is no CVA tenderness Musculoskeletal: No lower extremity tenderness, no upper extremity tenderness. No joint effusions, no DVT signs strong distal pulses no edema Neurologic:  Normal speech and language. No gross focal neurologic deficits are appreciated.  Skin:  Skin is warm, dry and intact. No rash noted. Psychiatric: Mood and affect are normal. Speech and behavior are normal.  ____________________________________________   LABS (all labs ordered are listed, but only abnormal results are displayed)  Labs Reviewed  COMPREHENSIVE METABOLIC PANEL - Abnormal; Notable for the following:       Result Value    Potassium 3.4 (*)    Glucose, Bld 117 (*)    BUN 27 (*)    Creatinine, Ser 1.62 (*)    GFR calc non Af Amer 33 (*)    GFR calc Af Amer 39 (*)    All other components within normal limits  CBC - Abnormal; Notable for the following:    RDW 15.5 (*)    All other components within normal limits  URINALYSIS COMPLETEWITH MICROSCOPIC ([facility] ONLY) - Abnormal; Notable for the following:    Color, Urine YELLOW (*)    APPearance HAZY (*)    Protein, ur 100 (*)    Leukocytes, UA 1+ (*)    Squamous Epithelial / LPF 0-5 (*)    All other components within normal limits  APTT - Abnormal; Notable for the following:    aPTT 39 (*)    All other components within normal limits  LIPASE, BLOOD  PROTIME-INR  LACTIC ACID, PLASMA  LACTIC ACID, PLASMA   ____________________________________________  EKG  I personally interpreted any EKGs ordered by me or triage Normal sinus rhythm rate 76 bpm no acute ST elevation or depression nonspecific ST changes ____________________________________________  RADIOLOGY  I reviewed any imaging ordered by me or triage that were performed during my shift and, if possible, patient and/or family made aware of any abnormal findings. ____________________________________________   PROCEDURES  Procedure(s) performed: None  Procedures  Critical Care performed: None  ____________________________________________   INITIAL IMPRESSION / ASSESSMENT AND PLAN / ED COURSE  Pertinent labs & imaging results that were available during my care of the patient were reviewed by me and considered in my medical decision making (see chart for details).   Patient with left upper quadrant abdominal pain and epigastric abdominal discomfort. This is very reproducible. EKG is reassuring I do not think this represents ACS. It is nonexertional there is no shortness of breath. We will obtain CT scan given history of diverticulitis. Patient is a very poor  historian  ----------------------------------------- 11:07 PM on 01/13/2016 -----------------------------------------  Patient has been made comfortable by fentanyl. Her abdomen remains benign with some tenderness but nonsurgical. CT shows either an ileus, or a possible early SBO. Discussed with Dr. Adonis Huguenin of surgery who reviewed the CT scans and patient findings who agrees to follow the patient. He feels this is more like an enteritis than an SBO on ct and therefore ngt is not warranted.  Radiology called  me and said that it was either enteritis or early sbo.   I think however given her multiple medical problems she would be better served on the medical service as it does not appear to be any imminent surgical pathology. In addition, there is no evidence at this time of ischemic gut, no occlusion seen no pneumatosis etc. White count is normal. This will need to be followed up she does worsen. She is not currently in atrial fibrillation. Patient did vomit 1 after having contrast here, She has clear lungs at this time but is due for an albuterol rx she says.   For all these reasons, we will have her admitted to the medicine service and they're in agreement.  Clinical Course   ____________________________________________   FINAL CLINICAL IMPRESSION(S) / ED DIAGNOSES  Final diagnoses:  None      This chart was dictated using voice recognition software.  Despite best efforts to proofread,  errors can occur which can change meaning.      Schuyler Amor, MD 01/13/16 2112    Schuyler Amor, MD 01/13/16 [place], MD 01/13/16 [place], MD 01/13/16 [phone]

## 2016-01-14 DIAGNOSIS — E039 Hypothyroidism, unspecified: Secondary | ICD-10-CM | POA: Diagnosis not present

## 2016-01-14 DIAGNOSIS — I10 Essential (primary) hypertension: Secondary | ICD-10-CM | POA: Diagnosis not present

## 2016-01-14 DIAGNOSIS — R1084 Generalized abdominal pain: Secondary | ICD-10-CM

## 2016-01-14 DIAGNOSIS — R109 Unspecified abdominal pain: Secondary | ICD-10-CM | POA: Diagnosis not present

## 2016-01-14 DIAGNOSIS — K567 Ileus, unspecified: Secondary | ICD-10-CM | POA: Diagnosis not present

## 2016-01-14 DIAGNOSIS — K219 Gastro-esophageal reflux disease without esophagitis: Secondary | ICD-10-CM | POA: Diagnosis not present

## 2016-01-14 LAB — BASIC METABOLIC PANEL
Anion gap: 11 (ref 5–15)
BUN: 23 mg/dL — AB (ref 6–20)
CALCIUM: 8.3 mg/dL — AB (ref 8.9–10.3)
CO2: 26 mmol/L (ref 22–32)
CREATININE: 1.31 mg/dL — AB (ref 0.44–1.00)
Chloride: 108 mmol/L (ref 101–111)
GFR calc Af Amer: 50 mL/min — ABNORMAL LOW (ref >60)
GFR, EST NON AFRICAN AMERICAN: 43 mL/min — AB (ref >60)
Glucose, Bld: 131 mg/dL — ABNORMAL HIGH (ref 65–99)
POTASSIUM: 3.2 mmol/L — AB (ref 3.5–5.1)
SODIUM: 145 mmol/L (ref 135–145)

## 2016-01-14 LAB — CBC
HCT: 38.7 % (ref 35.0–47.0)
Hemoglobin: 12.8 g/dL (ref 12.0–16.0)
MCH: 28.8 pg (ref 26.0–34.0)
MCHC: 33.2 g/dL (ref 32.0–36.0)
MCV: 86.9 fL (ref 80.0–100.0)
PLATELETS: 212 10*3/uL (ref 150–440)
RBC: 4.45 MIL/uL (ref 3.80–5.20)
RDW: 15.7 % — AB (ref 11.5–14.5)
WBC: 13.3 10*3/uL — AB (ref 3.6–11.0)

## 2016-01-14 MED ORDER — OXYCODONE HCL 5 MG PO TABS
5.0000 mg | ORAL_TABLET | ORAL | Status: DC | PRN
  Administered 2016-01-14 (×2): 5 mg via ORAL
  Filled 2016-01-14 (×2): qty 1

## 2016-01-14 MED ORDER — MONTELUKAST SODIUM 10 MG PO TABS
10.0000 mg | ORAL_TABLET | Freq: Every day | ORAL | Status: DC
  Administered 2016-01-14 – 2016-01-16 (×4): 10 mg via ORAL
  Filled 2016-01-14 (×4): qty 1

## 2016-01-14 MED ORDER — MOMETASONE FURO-FORMOTEROL FUM 200-5 MCG/ACT IN AERO
2.0000 | INHALATION_SPRAY | Freq: Two times a day (BID) | RESPIRATORY_TRACT | Status: DC
  Administered 2016-01-14 – 2016-01-17 (×7): 2 via RESPIRATORY_TRACT
  Filled 2016-01-14: qty 8.8

## 2016-01-14 MED ORDER — VITAMIN K1 10 MG/ML IJ SOLN: 10.0000 mg | Freq: Once | INTRAVENOUS | Status: DC

## 2016-01-14 MED ORDER — ONDANSETRON HCL 4 MG/2ML IJ SOLN
4.0000 mg | Freq: Four times a day (QID) | INTRAMUSCULAR | Status: DC | PRN
  Administered 2016-01-14 – 2016-01-15 (×3): 4 mg via INTRAVENOUS
  Filled 2016-01-14 (×3): qty 2

## 2016-01-14 MED ORDER — NORTRIPTYLINE HCL 10 MG PO CAPS
20.0000 mg | ORAL_CAPSULE | Freq: Every day | ORAL | Status: DC
  Administered 2016-01-14 – 2016-01-16 (×4): 20 mg via ORAL
  Filled 2016-01-14 (×4): qty 2

## 2016-01-14 MED ORDER — LEVOTHYROXINE SODIUM 75 MCG PO TABS
150.0000 ug | ORAL_TABLET | Freq: Every day | ORAL | Status: DC
  Administered 2016-01-14 – 2016-01-17 (×4): 150 ug via ORAL
  Filled 2016-01-14 (×4): qty 2

## 2016-01-14 MED ORDER — AMIODARONE HCL 200 MG PO TABS
200.0000 mg | ORAL_TABLET | Freq: Every day | ORAL | Status: DC
  Administered 2016-01-14 – 2016-01-17 (×4): 200 mg via ORAL
  Filled 2016-01-14 (×4): qty 1

## 2016-01-14 MED ORDER — ACETAMINOPHEN 650 MG RE SUPP: 650.0000 mg | Freq: Four times a day (QID) | RECTAL | Status: DC | PRN

## 2016-01-14 MED ORDER — ACETAMINOPHEN 325 MG PO TABS: 650.0000 mg | ORAL_TABLET | Freq: Four times a day (QID) | ORAL | Status: DC | PRN

## 2016-01-14 MED ORDER — IPRATROPIUM-ALBUTEROL 0.5-2.5 (3) MG/3ML IN SOLN: 3.0000 mL | RESPIRATORY_TRACT | Status: DC | PRN

## 2016-01-14 MED ORDER — METOPROLOL TARTRATE 50 MG PO TABS
50.0000 mg | ORAL_TABLET | Freq: Every day | ORAL | Status: DC
  Administered 2016-01-14 – 2016-01-17 (×4): 50 mg via ORAL
  Filled 2016-01-14 (×4): qty 1

## 2016-01-14 MED ORDER — PNEUMOCOCCAL VAC POLYVALENT 25 MCG/0.5ML IJ INJ
0.5000 mL | INJECTION | INTRAMUSCULAR | Status: AC
  Administered 2016-01-16: 0.5 mL via INTRAMUSCULAR
  Filled 2016-01-14: qty 0.5

## 2016-01-14 MED ORDER — HEPARIN SODIUM (PORCINE) 5000 UNIT/ML IJ SOLN
5000.0000 [IU] | Freq: Three times a day (TID) | INTRAMUSCULAR | Status: DC
  Administered 2016-01-14 – 2016-01-16 (×7): 5000 [IU] via SUBCUTANEOUS
  Filled 2016-01-14 (×7): qty 1

## 2016-01-14 MED ORDER — ONDANSETRON HCL 4 MG PO TABS: 4.0000 mg | ORAL_TABLET | Freq: Four times a day (QID) | ORAL | Status: DC | PRN

## 2016-01-14 MED ORDER — FAMOTIDINE 20 MG PO TABS
20.0000 mg | ORAL_TABLET | Freq: Two times a day (BID) | ORAL | Status: DC
  Administered 2016-01-14 – 2016-01-17 (×8): 20 mg via ORAL
  Filled 2016-01-14 (×8): qty 1

## 2016-01-14 MED ORDER — ATORVASTATIN CALCIUM 20 MG PO TABS
80.0000 mg | ORAL_TABLET | Freq: Every day | ORAL | Status: DC
  Administered 2016-01-14 – 2016-01-17 (×4): 80 mg via ORAL
  Filled 2016-01-14 (×4): qty 4

## 2016-01-14 MED ORDER — CLOPIDOGREL BISULFATE 75 MG PO TABS
75.0000 mg | ORAL_TABLET | Freq: Every day | ORAL | Status: DC
  Administered 2016-01-14 – 2016-01-17 (×4): 75 mg via ORAL
  Filled 2016-01-14 (×4): qty 1

## 2016-01-14 MED ORDER — SODIUM CHLORIDE 0.9% FLUSH
3.0000 mL | Freq: Two times a day (BID) | INTRAVENOUS | Status: DC
  Administered 2016-01-14 – 2016-01-16 (×5): 3 mL via INTRAVENOUS

## 2016-01-14 NOTE — Progress Notes (Signed)
Pt placed on [facility] CPAP C-3. CPAP plugged into red outlet. Pt tolerating well.

## 2016-01-14 NOTE — Progress Notes (Signed)
[facility] [place] NAME: Brenda Barber    MRN#:  [id number]  DATE OF BIRTH:  05-12-1956  SUBJECTIVE:  Hospital Day: 0 days Brenda Barber is a 60 y.o. female presenting with Nausea abdominal pain.   Overnight events: No acute overnight events Interval Events: Slowly improving symptoms going to eat clear liquid diet this morning Does complain of shortness of breath and wheezing  REVIEW OF SYSTEMS:  CONSTITUTIONAL: No fever, fatigue or weakness.  EYES: No blurred or double vision.  EARS, NOSE, AND THROAT: No tinnitus or ear pain.  RESPIRATORY: No cough, shortness of breath, Positive wheezing denies hemoptysis.  CARDIOVASCULAR: No chest pain, orthopnea, edema.  GASTROINTESTINAL: Mild nausea, denies current vomiting, diarrhea or abdominal pain.  GENITOURINARY: No dysuria, hematuria.  ENDOCRINE: No polyuria, nocturia,  HEMATOLOGY: No anemia, easy bruising or bleeding SKIN: No rash or lesion. MUSCULOSKELETAL: No joint pain or arthritis.   NEUROLOGIC: No tingling, numbness, weakness.  PSYCHIATRY: No anxiety or depression.   DRUG ALLERGIES:   Allergies  Allergen Reactions  . Aspirin-Dipyridamole Er     REACTION: Headache  . Rosuvastatin     REACTION: Not effective    VITALS:  Blood pressure 122/76, pulse 94, temperature 98.1 F (36.7 C), temperature source Oral, resp. rate 18, height '5\' 3"'$  (1.6 m), weight 68.3 kg (150 lb 8 oz), SpO2 99 %.  PHYSICAL EXAMINATION:  VITAL SIGNS: Vitals:   01/14/16 0546 01/14/16 0857  BP: 117/72 122/76  Pulse: 88 94  Resp: 18   Temp: 98.1 F (36.7 C)    GENERAL:60 y.o.female currently in no acute distress.  HEAD: Normocephalic, atraumatic.  EYES: Pupils equal, round, reactive to light. Extraocular muscles intact. No scleral icterus.  MOUTH: Moist mucosal membrane. Dentition intact. No abscess noted.  EAR, NOSE, THROAT: Clear without exudates. No external lesions.  NECK: Supple. No thyromegaly. No  nodules. No JVD.  PULMONARY:Diminished breath sounds but without wheeze rails or rhonci. No use of accessory muscles, Good respiratory effort. good air entry bilaterally CHEST: Nontender to palpation.  CARDIOVASCULAR: S1 and S2. Regular rate and rhythm. No murmurs, rubs, or gallops. No edema. Pedal pulses 2+ bilaterally.  GASTROINTESTINAL: Soft, nontender, nondistended. No masses. Positive bowel sounds. No hepatosplenomegaly.  MUSCULOSKELETAL: No swelling, clubbing, or edema. Range of motion full in all extremities.  NEUROLOGIC: Cranial nerves II through XII are intact. No gross focal neurological deficits. Sensation intact. Reflexes intact.  SKIN: No ulceration, lesions, rashes, or cyanosis. Skin warm and dry. Turgor intact.  PSYCHIATRIC: Mood, affect within normal limits. The patient is awake, alert and oriented x 3. Insight, judgment intact.      LABORATORY PANEL:   CBC  Recent Labs Lab 01/14/16 0548  WBC 13.3*  HGB 12.8  HCT 38.7  PLT 212   ------------------------------------------------------------------------------------------------------------------  Chemistries   Recent Labs Lab 01/13/16 1809 01/14/16 0548  NA 144 145  K 3.4* 3.2*  CL 103 108  CO2 32 26  GLUCOSE 117* 131*  BUN 27* 23*  CREATININE 1.62* 1.31*  CALCIUM 9.2 8.3*  AST 22  --   ALT 22  --   ALKPHOS 91  --   BILITOT 0.5  --    ------------------------------------------------------------------------------------------------------------------  Cardiac Enzymes  Recent Labs Lab 01/13/16 1809  TROPONINI <0.03   ------------------------------------------------------------------------------------------------------------------  RADIOLOGY:  Ct Abdomen Pelvis W Contrast  Result Date: 01/13/2016 CLINICAL DATA:  Epigastric and left upper quadrant abdominal pain. History of diverticulitis. Unknown chronicity. EXAM: CT ABDOMEN AND PELVIS  WITH CONTRAST TECHNIQUE: Multidetector CT imaging of the abdomen  and pelvis was performed using the standard protocol following bolus administration of intravenous contrast. CONTRAST:  98m ISOVUE-300 IOPAMIDOL (ISOVUE-300) INJECTION 61% COMPARISON:  10/19/2013 FINDINGS: Atelectasis in the lung bases. 7 mm nodule in the left lung base demonstrates slight enlargement since previous study. Non-contrast chest CT at 6-12 months is recommended. If the nodule is stable at time of repeat CT, then future CT at 18-24 months (from today's scan) is considered optional for low-risk patients, but is recommended for high-risk patients. This recommendation follows the consensus statement: Guidelines for Management of Incidental Pulmonary Nodules Detected on CT Images:From the Fleischner Society 2017; published online before print (10.1148/[id number]. The liver, spleen, gallbladder, pancreas, adrenal glands, kidneys, inferior vena cava, and retroperitoneal lymph nodes are unremarkable. Scattered calcifications in the abdominal aorta without aneurysm. Stomach is unremarkable. Mildly dilated jejunal small bowel with small bowel feces sign present. Multiple mid abdominal small bowel loops distal to the dilated loops demonstrate wall thickening with infiltration in the adjacent mesentery and small mesenteric fluid collections. Small amount of free fluid in the pelvis. Changes suggest early or partial small bowel obstruction and associated bowel wall thickening and edema could indicate coexisting infection short time tori enteritis versus localized ischemic change. No free air in the abdomen. No pneumatosis. Mesenteric arteries and veins appear patent. Colon is decompressed. Pelvis: Diverticulosis of the sigmoid colon. No definite evidence of diverticulitis although the presence of fluid in the pelvis could obscure inflammatory change. Appendix is normal. No pelvic mass or lymphadenopathy. Uterus and ovaries are not enlarged. Bladder wall is not thickened. Degenerative changes in the spine  and hips. IMPRESSION: Mildly dilated small bowel. Small bowel wall thickening and mesenteric edema. Changes likely to represent her partial small bowel obstruction, possibly associated with enteritis or skin change. **An incidental finding of potential clinical significance has been found. 7 mm left lung base nodule. Non-contrast chest CT at 6-12 months is recommended. If the nodule is stable at time of repeat CT, then future CT at 18-24 months (from today's scan) is considered optional for low-risk patients, but is recommended for high-risk patients. This recommendation follows the consensus statement: Guidelines for Management of Incidental Pulmonary Nodules Detected on CT Images:From the Fleischner Society 2017; published online before print (10.1148/[id number].** These results were called by telephone at the time of interpretation on 01/13/2016 at 10:57 pm to Dr. JCharlotte Crumb, who verbally acknowledged these results. Electronically Signed   By: WLucienne CapersM.D.   On: 01/13/2016 23:01    EKG:   Orders placed or performed during the hospital encounter of 01/13/16  . EKG 12-Lead  . EKG 12-Lead  . ED EKG  . ED EKG    ASSESSMENT AND PLAN:   Brenda Stoffelis a 60y.o. female presenting with Nausea abdominal pain . Admitted 01/13/2016 : Day #: 0 days   1. Enteritis: Supportive measures patient has been evaluated by surgery no acute surgical issue-there was concern for partial small bowel obstruction on imaging 2. Persistent mild asthma: No acute exacerbation no wheezes on exam will add breathing treatments 3. Hypothyroidism unspecified: Synthroid GERD without esophagitis, H2 Essential hypertension: Continue home medications  Anticipate advance diet tomorrow hopefully discharge shortly thereafter  All the records are reviewed and case discussed with Care Management/Social Workerr. Management plans discussed with the patient, family and they are in agreement.  CODE STATUS: full TOTAL  TIME TAKING CARE OF THIS PATIENT: 33 minutes.   POSSIBLE D/C  IN 1-2DAYS, DEPENDING ON CLINICAL CONDITION.   Delbert Vu,  Karenann Cai.D on 01/14/2016 at 11:33 AM  Between 7am to 6pm - Pager - [phone]  After 6pm: House Pager: - [phone]  [facility]  Office  [phone]  CC: Primary care physician; [name], MD

## 2016-01-14 NOTE — Consult Note (Signed)
Patient ID: Brenda Barber, female   DOB: [birth year], 60 y.o.   MRN: [id number]  HPI Brenda Barber is a 60 y.o. female admitted for abd pain. It started several weeks ago on and off but yesterday it exacerbated. Pain is colicky severe type intermittent , non radiated and associated w Nausea and vomiting. Last BM and flatus was yesterday. Pain was relief by narcotics. CT scan personally reviewed some. mild SB dilation w thickening of SB wall and mesentery c/w enteritis. No free air or pneumatosis, no definitve transition point. She has had a hx of appendectomy and is on plavix. She c/o some SOB from her asthma HPI  Past Medical History:  Diagnosis Date  . Allergy   . Cerebrovascular accident [facility])    History of visual deficit  . Depression   . GERD (gastroesophageal reflux disease)   . Hyperlipidemia   . Hypertension   . Hypoparathyroidism ([place])   . Personality disorder, depressive   . Thyroid cancer ([place]) 1999   Papillary  . Thyroid disease    hypothyroid     Past Surgical History:  Procedure Laterality Date  . APPENDECTOMY    . CANNOT TOLERATE PAP / Virginal    . MRI brain  12/1999  . THYROIDECTOMY    . TONSILLECTOMY      Family History  Problem Relation Age of Onset  . Diabetes Father   . Diabetes Paternal Aunt   . Diabetes Paternal Uncle   . Breast cancer Sister 32    Social History Social History  Substance Use Topics  . Smoking status: Never Smoker  . Smokeless tobacco: Never Used  . Alcohol use No    Allergies  Allergen Reactions  . Aspirin-Dipyridamole Er     REACTION: Headache  . Rosuvastatin     REACTION: Not effective    Current Facility-Administered Medications  Medication Dose Route Frequency Provider Last Rate Last Dose  . acetaminophen (TYLENOL) tablet 650 mg  650 mg Oral Q6H PRN Lance Coon, MD       Or  . acetaminophen (TYLENOL) suppository 650 mg  650 mg Rectal Q6H PRN Lance Coon, MD      . amiodarone (PACERONE) tablet 200 mg  200 mg Oral  Daily Lance Coon, MD   200 mg at 01/14/16 0858  . atorvastatin (LIPITOR) tablet 80 mg  80 mg Oral Daily Lance Coon, MD   80 mg at 01/14/16 0857  . clopidogrel (PLAVIX) tablet 75 mg  75 mg Oral Daily Lance Coon, MD   75 mg at 01/14/16 0858  . famotidine (PEPCID) tablet 20 mg  20 mg Oral BID Lance Coon, MD   20 mg at 01/14/16 0858  . heparin injection 5,000 Units  5,000 Units Subcutaneous Q8H Lance Coon, MD   5,000 Units at 01/14/16 1511  . ipratropium-albuterol (DUONEB) 0.5-2.5 (3) MG/3ML nebulizer solution 3 mL  3 mL Nebulization Q4H PRN Lytle Butte, MD      . levothyroxine (SYNTHROID, LEVOTHROID) tablet 150 mcg  150 mcg Oral QAC breakfast Lance Coon, MD   150 mcg at 01/14/16 0858  . metoprolol (LOPRESSOR) tablet 50 mg  50 mg Oral Daily Lance Coon, MD   50 mg at 01/14/16 0858  . mometasone-formoterol (DULERA) 200-5 MCG/ACT inhaler 2 puff  2 puff Inhalation BID Lance Coon, MD   2 puff at 01/14/16 [phone]  . montelukast (SINGULAIR) tablet 10 mg  10 mg Oral QHS Lance Coon, MD   10 mg at 01/14/16 0257  .  nortriptyline (PAMELOR) capsule 20 mg  20 mg Oral QHS Lance Coon, MD   20 mg at 01/14/16 0257  . ondansetron (ZOFRAN) tablet 4 mg  4 mg Oral Q6H PRN Lance Coon, MD       Or  . ondansetron [facility]) injection 4 mg  4 mg Intravenous Q6H PRN Lance Coon, MD   4 mg at 01/14/16 4503  . oxyCODONE (Oxy IR/ROXICODONE) immediate release tablet 5 mg  5 mg Oral Q4H PRN Lance Coon, MD   5 mg at 01/14/16 1534  . [START ON 01/15/2016] pneumococcal 23 valent vaccine (PNU-IMMUNE) injection 0.5 mL  0.5 mL Intramuscular Tomorrow-1000 Lance Coon, MD      . sodium chloride flush (NS) 0.9 % injection 3 mL  3 mL Intravenous Q12H Lance Coon, MD   3 mL at 01/14/16 0901     Review of Systems A 10 point review of systems was asked and was negative except for the information on the HPI  Physical Exam Blood pressure 124/75, pulse 86, temperature 97.7 F (36.5 C), temperature source Oral, resp.  rate 20, height '5\' 3"'$  (1.6 m), weight 68.3 kg (150 lb 8 oz), SpO2 100 %. CONSTITUTIONAL: Elderly female in NAD EYES: Pupils are equal, round, and reactive to light, Sclera are non-icteric. EARS, NOSE, MOUTH AND THROAT: The oropharynx is clear. The oral mucosa is pink and moist. Hearing is intact to voice. LYMPH NODES:  Lymph nodes in the neck are normal. RESPIRATORY:  Lungs are clear. There is normal respiratory effort, with equal breath sounds bilaterally, and without pathologic use of accessory muscles. CARDIOVASCULAR: Heart is regular without murmurs, gallops, or rubs. GI: The abdomen is  soft,  Mild tenderness, no peritonitis and nondistended. There are no palpable masses. There is no hepatosplenomegaly. There are normal bowel sounds in all quadrants. GU: Rectal deferred.   MUSCULOSKELETAL: Normal muscle strength and tone. No cyanosis or edema.   SKIN: Turgor is good and there are no pathologic skin lesions or ulcers. NEUROLOGIC: Motor and sensation is grossly normal. Cranial nerves are grossly intact. PSYCH:  Oriented to person, place and time. Affect is normal.  Data Reviewed I have personally reviewed the patient's imaging, laboratory findings and medical records.    Assessment/ Plant Enteritis causing ileus. No surgical intervention and this point> recommend NPO, iv hydration and serial abd exam and x rays. D/W the pt in detail as well as with Dr. Lavetta Nielsen. We will continue to follow. Recommend k replacement as well to help w her ileus. Rest of Medical issues per hospitalist team  Caroleen Hamman, MD FACS General Surgeon 01/14/2016, 4:40 PM

## 2016-01-14 NOTE — Care Management Obs Status (Signed)
[place] NOTIFICATION   Patient Details  Name: Brenda Barber MRN: [id number] Date of Birth: 1955/12/13   Medicare Observation Status Notification Given:  Yes    Ival Bible, RN 01/14/2016, 6:32 PM

## 2016-01-15 ENCOUNTER — Observation Stay: Payer: Commercial Managed Care - HMO

## 2016-01-15 DIAGNOSIS — E039 Hypothyroidism, unspecified: Secondary | ICD-10-CM | POA: Diagnosis not present

## 2016-01-15 DIAGNOSIS — K219 Gastro-esophageal reflux disease without esophagitis: Secondary | ICD-10-CM | POA: Diagnosis not present

## 2016-01-15 DIAGNOSIS — K567 Ileus, unspecified: Secondary | ICD-10-CM | POA: Diagnosis not present

## 2016-01-15 DIAGNOSIS — I10 Essential (primary) hypertension: Secondary | ICD-10-CM | POA: Diagnosis not present

## 2016-01-15 DIAGNOSIS — R109 Unspecified abdominal pain: Secondary | ICD-10-CM | POA: Diagnosis not present

## 2016-01-15 LAB — BASIC METABOLIC PANEL
ANION GAP: 7 (ref 5–15)
BUN: 14 mg/dL (ref 6–20)
CALCIUM: 7.6 mg/dL — AB (ref 8.9–10.3)
CO2: 30 mmol/L (ref 22–32)
Chloride: 105 mmol/L (ref 101–111)
Creatinine, Ser: 1.07 mg/dL — ABNORMAL HIGH (ref 0.44–1.00)
GFR calc Af Amer: >60 mL/min (ref >60)
GFR, EST NON AFRICAN AMERICAN: 55 mL/min — AB (ref >60)
GLUCOSE: 104 mg/dL — AB (ref 65–99)
POTASSIUM: 3.2 mmol/L — AB (ref 3.5–5.1)
SODIUM: 142 mmol/L (ref 135–145)

## 2016-01-15 LAB — CBC
HCT: 34.8 % — ABNORMAL LOW (ref 35.0–47.0)
Hemoglobin: 11.7 g/dL — ABNORMAL LOW (ref 12.0–16.0)
MCH: 29.2 pg (ref 26.0–34.0)
MCHC: 33.5 g/dL (ref 32.0–36.0)
MCV: 87.2 fL (ref 80.0–100.0)
PLATELETS: 229 10*3/uL (ref 150–440)
RBC: 3.99 MIL/uL (ref 3.80–5.20)
RDW: 15.7 % — AB (ref 11.5–14.5)
WBC: 8.6 10*3/uL (ref 3.6–11.0)

## 2016-01-15 MED ORDER — POTASSIUM CHLORIDE IN NACL 20-0.9 MEQ/L-% IV SOLN
INTRAVENOUS | Status: DC
  Administered 2016-01-15 – 2016-01-16 (×3): via INTRAVENOUS
  Administered 2016-01-17: 1000 mL via INTRAVENOUS
  Filled 2016-01-15 (×6): qty 1000

## 2016-01-15 NOTE — Progress Notes (Signed)
CC: ileus vs enteritis Subjective: Now nauseated this am, mild intermittent abd pain Passing flatus KUB reviewed w evidence of contrast all the way to the colon and improvement in bowel dilation  Objective: Vital signs in last 24 hours: Temp:  [97.5 F (36.4 C)-97.7 F (36.5 C)] 97.5 F (36.4 C) (08/27 0442) Pulse Rate:  [69-86] 70 (08/27 0825) Resp:  [20] 20 (08/27 0442) BP: (124-155)/(73-85) 155/84 (08/27 0825) SpO2:  [96 %-100 %] 96 % (08/27 0442) Last BM Date: 01/13/16  Intake/Output from previous day: 08/26 0701 - 08/27 0700 In: 753 [P.O.:750; I.V.:3] Out: 550 [Urine:550] Intake/Output this shift: Total I/O In: 0  Out: 350 [Urine:350]  Physical exam: NAD awake alert Chest : CTA, S1,s2 no murmurs ABD: SOFT, NT, MILDLY DISTENDED, no peritonitis Ext: well perfused, warm Neuro: awake alert no motor or sensory deficits   Lab Results: CBC   Recent Labs  01/14/16 0548 01/15/16 0809  WBC 13.3* 8.6  HGB 12.8 11.7*  HCT 38.7 34.8*  PLT 212 229   BMET  Recent Labs  01/14/16 0548 01/15/16 0809  NA 145 142  K 3.2* 3.2*  CL 108 105  CO2 26 30  GLUCOSE 131* 104*  BUN 23* 14  CREATININE 1.31* 1.07*  CALCIUM 8.3* 7.6*   PT/INR  Recent Labs  01/13/16 1809  LABPROT 13.5  INR 1.03   ABG No results for input(s): PHART, HCO3 in the last 72 hours.  Invalid input(s): PCO2, PO2  Studies/Results: Ct Abdomen Pelvis W Contrast  Result Date: 01/13/2016 CLINICAL DATA:  Epigastric and left upper quadrant abdominal pain. History of diverticulitis. Unknown chronicity. EXAM: CT ABDOMEN AND PELVIS WITH CONTRAST TECHNIQUE: Multidetector CT imaging of the abdomen and pelvis was performed using the standard protocol following bolus administration of intravenous contrast. CONTRAST:  72m ISOVUE-300 IOPAMIDOL (ISOVUE-300) INJECTION 61% COMPARISON:  10/19/2013 FINDINGS: Atelectasis in the lung bases. 7 mm nodule in the left lung base demonstrates slight enlargement since  previous study. Non-contrast chest CT at 6-12 months is recommended. If the nodule is stable at time of repeat CT, then future CT at 18-24 months (from today's scan) is considered optional for low-risk patients, but is recommended for high-risk patients. This recommendation follows the consensus statement: Guidelines for Management of Incidental Pulmonary Nodules Detected on CT Images:From the Fleischner Society 2017; published online before print (10.1148/[id number]. The liver, spleen, gallbladder, pancreas, adrenal glands, kidneys, inferior vena cava, and retroperitoneal lymph nodes are unremarkable. Scattered calcifications in the abdominal aorta without aneurysm. Stomach is unremarkable. Mildly dilated jejunal small bowel with small bowel feces sign present. Multiple mid abdominal small bowel loops distal to the dilated loops demonstrate wall thickening with infiltration in the adjacent mesentery and small mesenteric fluid collections. Small amount of free fluid in the pelvis. Changes suggest early or partial small bowel obstruction and associated bowel wall thickening and edema could indicate coexisting infection short time tori enteritis versus localized ischemic change. No free air in the abdomen. No pneumatosis. Mesenteric arteries and veins appear patent. Colon is decompressed. Pelvis: Diverticulosis of the sigmoid colon. No definite evidence of diverticulitis although the presence of fluid in the pelvis could obscure inflammatory change. Appendix is normal. No pelvic mass or lymphadenopathy. Uterus and ovaries are not enlarged. Bladder wall is not thickened. Degenerative changes in the spine and hips. IMPRESSION: Mildly dilated small bowel. Small bowel wall thickening and mesenteric edema. Changes likely to represent her partial small bowel obstruction, possibly associated with enteritis or skin change. **An incidental finding of  potential clinical significance has been found. 7 mm left lung base  nodule. Non-contrast chest CT at 6-12 months is recommended. If the nodule is stable at time of repeat CT, then future CT at 18-24 months (from today's scan) is considered optional for low-risk patients, but is recommended for high-risk patients. This recommendation follows the consensus statement: Guidelines for Management of Incidental Pulmonary Nodules Detected on CT Images:From the Fleischner Society 2017; published online before print (10.1148/[id number]).** These results were called by telephone at the time of interpretation on 01/13/2016 at 10:57 pm to Dr. Charlotte Crumb , who verbally acknowledged these results. Electronically Signed   By: Lucienne Capers M.D.   On: 01/13/2016 23:01   Dg Abd Acute W/chest  Result Date: 01/15/2016 CLINICAL DATA:  Ileus EXAM: DG ABDOMEN ACUTE W/ 1V CHEST COMPARISON:  09/08/2015 FINDINGS: The heart size appears within normal limits. There is mild bibasilar atelectasis. A tortuous and unfolded thoracic aorta is noted. There is been progression of enteric contrast material from recent CT scan into the large bowel. Decrease in dilatation of the small bowel loops. IMPRESSION: 1. Resolving partial small bowel obstruction pattern. Electronically Signed   By: Kerby Moors M.D.   On: 01/15/2016 09:58    Anti-infectives: Anti-infectives    None      Assessment/Plan: Ileus vs enteritis Keep npo today No surgical intervention D/w pt in detail We will continue to follow  Caroleen Hamman, MD, FACS  01/15/2016

## 2016-01-15 NOTE — Progress Notes (Signed)
[facility] [place] NAME: Brenda Barber    MRN#:  [id number]  DATE OF BIRTH:  1955-09-08  SUBJECTIVE:  Hospital Day: 0 days Brenda Barber is a 60 y.o. female presenting with Nausea abdominal pain.   Overnight events: No acute overnight events Interval Events: Complains of nausea  REVIEW OF SYSTEMS:  CONSTITUTIONAL: No fever, fatigue or weakness.  EYES: No blurred or double vision.  EARS, NOSE, AND THROAT: No tinnitus or ear pain.  RESPIRATORY: No cough, shortness of breath, Denies wheezing denies hemoptysis.  CARDIOVASCULAR: No chest pain, orthopnea, edema.  GASTROINTESTINAL: Mild nausea, denies current vomiting, diarrhea or abdominal pain.  GENITOURINARY: No dysuria, hematuria.  ENDOCRINE: No polyuria, nocturia,  HEMATOLOGY: No anemia, easy bruising or bleeding SKIN: No rash or lesion. MUSCULOSKELETAL: No joint pain or arthritis.   NEUROLOGIC: No tingling, numbness, weakness.  PSYCHIATRY: No anxiety or depression.   DRUG ALLERGIES:   Allergies  Allergen Reactions  . Aspirin-Dipyridamole Er     REACTION: Headache  . Rosuvastatin     REACTION: Not effective    VITALS:  Blood pressure (!) 155/84, pulse 70, temperature 97.5 F (36.4 C), temperature source Oral, resp. rate 20, height '5\' 3"'$  (1.6 m), weight 68.3 kg (150 lb 8 oz), SpO2 96 %.  PHYSICAL EXAMINATION:  VITAL SIGNS: Vitals:   01/15/16 0442 01/15/16 0825  BP: (!) 142/73 (!) 155/84  Pulse: 69 70  Resp: 20   Temp: 97.5 F (36.4 C)    GENERAL:60 y.o.female currently in no acute distress.  HEAD: Normocephalic, atraumatic.  EYES: Pupils equal, round, reactive to light. Extraocular muscles intact. No scleral icterus.  MOUTH: Moist mucosal membrane. Dentition intact. No abscess noted.  EAR, NOSE, THROAT: Clear without exudates. No external lesions.  NECK: Supple. No thyromegaly. No nodules. No JVD.  PULMONARY:Diminished breath sounds but without wheeze rails or rhonci.  No use of accessory muscles, Good respiratory effort. good air entry bilaterally CHEST: Nontender to palpation.  CARDIOVASCULAR: S1 and S2. Regular rate and rhythm. No murmurs, rubs, or gallops. No edema. Pedal pulses 2+ bilaterally.  GASTROINTESTINAL: Soft, nontender, nondistended. No masses. Positive bowel sounds. No hepatosplenomegaly.  MUSCULOSKELETAL: No swelling, clubbing, or edema. Range of motion full in all extremities.  NEUROLOGIC: Cranial nerves II through XII are intact. No gross focal neurological deficits. Sensation intact. Reflexes intact.  SKIN: No ulceration, lesions, rashes, or cyanosis. Skin warm and dry. Turgor intact.  PSYCHIATRIC: Mood, affect within normal limits. The patient is awake, alert and oriented x 3. Insight, judgment intact.      LABORATORY PANEL:   CBC  Recent Labs Lab 01/15/16 0809  WBC 8.6  HGB 11.7*  HCT 34.8*  PLT 229   ------------------------------------------------------------------------------------------------------------------  Chemistries   Recent Labs Lab 01/13/16 1809  01/15/16 0809  NA 144  < > 142  K 3.4*  < > 3.2*  CL 103  < > 105  CO2 32  < > 30  GLUCOSE 117*  < > 104*  BUN 27*  < > 14  CREATININE 1.62*  < > 1.07*  CALCIUM 9.2  < > 7.6*  AST 22  --   --   ALT 22  --   --   ALKPHOS 91  --   --   BILITOT 0.5  --   --   < > = values in this interval not displayed. ------------------------------------------------------------------------------------------------------------------  Cardiac Enzymes  Recent Labs Lab 01/13/16 1809  TROPONINI <0.03   ------------------------------------------------------------------------------------------------------------------  RADIOLOGY:  Ct Abdomen Pelvis W Contrast  Result Date: 01/13/2016 CLINICAL DATA:  Epigastric and left upper quadrant abdominal pain. History of diverticulitis. Unknown chronicity. EXAM: CT ABDOMEN AND PELVIS WITH CONTRAST TECHNIQUE: Multidetector CT imaging  of the abdomen and pelvis was performed using the standard protocol following bolus administration of intravenous contrast. CONTRAST:  26m ISOVUE-300 IOPAMIDOL (ISOVUE-300) INJECTION 61% COMPARISON:  10/19/2013 FINDINGS: Atelectasis in the lung bases. 7 mm nodule in the left lung base demonstrates slight enlargement since previous study. Non-contrast chest CT at 6-12 months is recommended. If the nodule is stable at time of repeat CT, then future CT at 18-24 months (from today's scan) is considered optional for low-risk patients, but is recommended for high-risk patients. This recommendation follows the consensus statement: Guidelines for Management of Incidental Pulmonary Nodules Detected on CT Images:From the Fleischner Society 2017; published online before print (10.1148/[id number]. The liver, spleen, gallbladder, pancreas, adrenal glands, kidneys, inferior vena cava, and retroperitoneal lymph nodes are unremarkable. Scattered calcifications in the abdominal aorta without aneurysm. Stomach is unremarkable. Mildly dilated jejunal small bowel with small bowel feces sign present. Multiple mid abdominal small bowel loops distal to the dilated loops demonstrate wall thickening with infiltration in the adjacent mesentery and small mesenteric fluid collections. Small amount of free fluid in the pelvis. Changes suggest early or partial small bowel obstruction and associated bowel wall thickening and edema could indicate coexisting infection short time tori enteritis versus localized ischemic change. No free air in the abdomen. No pneumatosis. Mesenteric arteries and veins appear patent. Colon is decompressed. Pelvis: Diverticulosis of the sigmoid colon. No definite evidence of diverticulitis although the presence of fluid in the pelvis could obscure inflammatory change. Appendix is normal. No pelvic mass or lymphadenopathy. Uterus and ovaries are not enlarged. Bladder wall is not thickened. Degenerative changes  in the spine and hips. IMPRESSION: Mildly dilated small bowel. Small bowel wall thickening and mesenteric edema. Changes likely to represent her partial small bowel obstruction, possibly associated with enteritis or skin change. **An incidental finding of potential clinical significance has been found. 7 mm left lung base nodule. Non-contrast chest CT at 6-12 months is recommended. If the nodule is stable at time of repeat CT, then future CT at 18-24 months (from today's scan) is considered optional for low-risk patients, but is recommended for high-risk patients. This recommendation follows the consensus statement: Guidelines for Management of Incidental Pulmonary Nodules Detected on CT Images:From the Fleischner Society 2017; published online before print (10.1148/[id number].** These results were called by telephone at the time of interpretation on 01/13/2016 at 10:57 pm to Dr. JCharlotte Crumb, who verbally acknowledged these results. Electronically Signed   By: WLucienne CapersM.D.   On: 01/13/2016 23:01   Dg Abd Acute W/chest  Result Date: 01/15/2016 CLINICAL DATA:  Ileus EXAM: DG ABDOMEN ACUTE W/ 1V CHEST COMPARISON:  09/08/2015 FINDINGS: The heart size appears within normal limits. There is mild bibasilar atelectasis. A tortuous and unfolded thoracic aorta is noted. There is been progression of enteric contrast material from recent CT scan into the large bowel. Decrease in dilatation of the small bowel loops. IMPRESSION: 1. Resolving partial small bowel obstruction pattern. Electronically Signed   By: TKerby MoorsM.D.   On: 01/15/2016 09:58    EKG:   Orders placed or performed during the hospital encounter of 01/13/16  . EKG 12-Lead  . EKG 12-Lead  . ED EKG  . ED EKG    ASSESSMENT AND PLAN:   MAlexya Mcdarisis  a 60 y.o. female presenting with Nausea abdominal pain . Admitted 01/13/2016 : Day #: 0 days   1. Enteritis: Supportive measures patient has been evaluated by surgery no acute  surgical issue-there was concern for partial small bowel obstruction on imaging, passing flatus today 2. Persistent mild asthma: Improved  3. Hypothyroidism unspecified: Synthroid 4. Hypokalemia: Replace, cold 4-5 GERD without esophagitis, H2 Essential hypertension: Continue home medications  Anticipate advance diet tomorrow hopefully discharge shortly thereafter  All the records are reviewed and case discussed with Care Management/Social Workerr. Management plans discussed with the patient, family and they are in agreement.  CODE STATUS: full TOTAL TIME TAKING CARE OF THIS PATIENT: 33 minutes.   POSSIBLE D/C IN 1-2DAYS, DEPENDING ON CLINICAL CONDITION.   Hower,  Karenann Cai.D on 01/15/2016 at 10:50 AM  Between 7am to 6pm - Pager - [phone]  After 6pm: House Pager: - [facility]  Office  [phone]  CC: Primary care physician; [name], MD

## 2016-01-16 DIAGNOSIS — K567 Ileus, unspecified: Secondary | ICD-10-CM | POA: Diagnosis not present

## 2016-01-16 DIAGNOSIS — I10 Essential (primary) hypertension: Secondary | ICD-10-CM | POA: Diagnosis not present

## 2016-01-16 DIAGNOSIS — R112 Nausea with vomiting, unspecified: Secondary | ICD-10-CM

## 2016-01-16 DIAGNOSIS — E039 Hypothyroidism, unspecified: Secondary | ICD-10-CM | POA: Diagnosis not present

## 2016-01-16 DIAGNOSIS — R109 Unspecified abdominal pain: Secondary | ICD-10-CM | POA: Diagnosis not present

## 2016-01-16 DIAGNOSIS — R1030 Lower abdominal pain, unspecified: Secondary | ICD-10-CM | POA: Diagnosis not present

## 2016-01-16 DIAGNOSIS — K219 Gastro-esophageal reflux disease without esophagitis: Secondary | ICD-10-CM | POA: Diagnosis not present

## 2016-01-16 DIAGNOSIS — R1110 Vomiting, unspecified: Secondary | ICD-10-CM

## 2016-01-16 MED ORDER — ENOXAPARIN SODIUM 40 MG/0.4ML ~~LOC~~ SOLN
40.0000 mg | SUBCUTANEOUS | Status: DC
  Administered 2016-01-16: 40 mg via SUBCUTANEOUS
  Filled 2016-01-16: qty 0.4

## 2016-01-16 NOTE — Progress Notes (Signed)
[facility] [place] NAME: Brenda Barber    MRN#:  [id number]  DATE OF BIRTH:  Jun 04, 1955  SUBJECTIVE:  Hospital Day: 0 days Brenda Barber is a 60 y.o. female presenting with Nausea abdominal pain.   Overnight events: No acute overnight events Interval Events: No complaints, denies nausea abdominal pain  REVIEW OF SYSTEMS:  CONSTITUTIONAL: No fever, fatigue or weakness.  EYES: No blurred or double vision.  EARS, NOSE, AND THROAT: No tinnitus or ear pain.  RESPIRATORY: No cough, shortness of breath, Denies wheezing denies hemoptysis.  CARDIOVASCULAR: No chest pain, orthopnea, edema.  GASTROINTESTINAL: Mild nausea, denies current vomiting, diarrhea or abdominal pain.  GENITOURINARY: No dysuria, hematuria.  ENDOCRINE: No polyuria, nocturia,  HEMATOLOGY: No anemia, easy bruising or bleeding SKIN: No rash or lesion. MUSCULOSKELETAL: No joint pain or arthritis.   NEUROLOGIC: No tingling, numbness, weakness.  PSYCHIATRY: No anxiety or depression.   DRUG ALLERGIES:   Allergies  Allergen Reactions  . Aspirin-Dipyridamole Er     REACTION: Headache  . Rosuvastatin     REACTION: Not effective    VITALS:  Blood pressure (!) 146/78, pulse 76, temperature 97.7 F (36.5 C), temperature source Oral, resp. rate 18, height '5\' 3"'$  (1.6 m), weight 68.3 kg (150 lb 8 oz), SpO2 96 %.  PHYSICAL EXAMINATION:  VITAL SIGNS: Vitals:   01/16/16 0440 01/16/16 1238  BP: (!) 161/84 (!) 146/78  Pulse: 69 76  Resp: 18 18  Temp: 97.4 F (36.3 C) 97.7 F (36.5 C)   GENERAL:60 y.o.female currently in no acute distress.  HEAD: Normocephalic, atraumatic.  EYES: Pupils equal, round, reactive to light. Extraocular muscles intact. No scleral icterus.  MOUTH: Moist mucosal membrane. Dentition intact. No abscess noted.  EAR, NOSE, THROAT: Clear without exudates. No external lesions.  NECK: Supple. No thyromegaly. No nodules. No JVD.  PULMONARY:Diminished breath  sounds but without wheeze rails or rhonci. No use of accessory muscles, Good respiratory effort. good air entry bilaterally CHEST: Nontender to palpation.  CARDIOVASCULAR: S1 and S2. Regular rate and rhythm. No murmurs, rubs, or gallops. No edema. Pedal pulses 2+ bilaterally.  GASTROINTESTINAL: Soft, nontender, nondistended. No masses. Positive bowel sounds. No hepatosplenomegaly.  MUSCULOSKELETAL: No swelling, clubbing, or edema. Range of motion full in all extremities.  NEUROLOGIC: Cranial nerves II through XII are intact. No gross focal neurological deficits. Sensation intact. Reflexes intact.  SKIN: No ulceration, lesions, rashes, or cyanosis. Skin warm and dry. Turgor intact.  PSYCHIATRIC: Mood, affect within normal limits. The patient is awake, alert and oriented x 3. Insight, judgment intact.      LABORATORY PANEL:   CBC  Recent Labs Lab 01/15/16 0809  WBC 8.6  HGB 11.7*  HCT 34.8*  PLT 229   ------------------------------------------------------------------------------------------------------------------  Chemistries   Recent Labs Lab 01/13/16 1809  01/15/16 0809  NA 144  < > 142  K 3.4*  < > 3.2*  CL 103  < > 105  CO2 32  < > 30  GLUCOSE 117*  < > 104*  BUN 27*  < > 14  CREATININE 1.62*  < > 1.07*  CALCIUM 9.2  < > 7.6*  AST 22  --   --   ALT 22  --   --   ALKPHOS 91  --   --   BILITOT 0.5  --   --   < > = values in this interval not displayed. ------------------------------------------------------------------------------------------------------------------  Cardiac Enzymes  Recent Labs Lab 01/13/16 1809  TROPONINI <0.03   ------------------------------------------------------------------------------------------------------------------  RADIOLOGY:  Dg Abd Acute W/chest  Result Date: 01/15/2016 CLINICAL DATA:  Ileus EXAM: DG ABDOMEN ACUTE W/ 1V CHEST COMPARISON:  09/08/2015 FINDINGS: The heart size appears within normal limits. There is mild  bibasilar atelectasis. A tortuous and unfolded thoracic aorta is noted. There is been progression of enteric contrast material from recent CT scan into the large bowel. Decrease in dilatation of the small bowel loops. IMPRESSION: 1. Resolving partial small bowel obstruction pattern. Electronically Signed   By: Kerby Moors M.D.   On: 01/15/2016 09:58    EKG:   Orders placed or performed during the hospital encounter of 01/13/16  . EKG 12-Lead  . EKG 12-Lead  . ED EKG  . ED EKG    ASSESSMENT AND PLAN:   Brenda Barber is a 60 y.o. female presenting with Nausea abdominal pain . Admitted 01/13/2016 : Day #: 0 days   1. Enteritis: Supportive measures patient has been evaluated by surgery no acute surgical issue-there was concern for partial small bowel obstruction on imaging, passing flatus todaySymptoms improved advance diet 2. Persistent mild asthma: Improved  3. Hypothyroidism unspecified: Synthroid 4. Hypokalemia: Replace, goal 4-5 GERD without esophagitis, H2 Essential hypertension: Continue home medications  Anticipate advance diet tomorrow hopefully discharge shortly thereafter  All the records are reviewed and case discussed with Care Management/Social Workerr. Management plans discussed with the patient, family and they are in agreement.  CODE STATUS: full TOTAL TIME TAKING CARE OF THIS PATIENT: 28 minutes.   POSSIBLE D/C IN 1-2DAYS, DEPENDING ON CLINICAL CONDITION.   Hower,  Karenann Cai.D on 01/16/2016 at 12:47 PM  Between 7am to 6pm - Pager - [phone]  After 6pm: House Pager: - [facility]  Office  [phone]  CC: Primary care physician; [name], MD

## 2016-01-16 NOTE — Progress Notes (Signed)
60 yr old female with enteritis.  Patient states abdominal pain resolved.  She states that she is passing flatus.  She denies any nausea, she states that she is very hungry.  Vitals:   01/16/16 0440 01/16/16 1238  BP: (!) 161/84 (!) 146/78  Pulse: 69 76  Resp: 18 18  Temp: 97.4 F (36.3 C) 97.7 F (36.5 C)   I/O last 3 completed shifts: In: 1804.8 [P.O.:360; I.V.:1444.8] Out: 2300 [Urine:2300] Total I/O In: 1022 [P.O.:760; I.V.:262] Out: 800 [Urine:800]   PE: Gen: NAD, AAOx3 Cardio: RRR Res: CTAB/L  Abd: soft, nt, nd  Ext: 2+ pulses, no edema Psych: affect and judment normal   CBC Latest Ref Rng & Units 01/15/2016 01/14/2016 01/13/2016  WBC 3.6 - 11.0 K/uL 8.6 13.3(H) 10.1  Hemoglobin 12.0 - 16.0 g/dL 11.7(L) 12.8 12.4  Hematocrit 35.0 - 47.0 % 34.8(L) 38.7 37.6  Platelets 150 - 440 K/uL 229 212 248   CMP Latest Ref Rng & Units 01/15/2016 01/14/2016 01/13/2016  Glucose 65 - 99 mg/dL 104(H) 131(H) 117(H)  BUN 6 - 20 mg/dL 14 23(H) 27(H)  Creatinine 0.44 - 1.00 mg/dL 1.07(H) 1.31(H) 1.62(H)  Sodium 135 - 145 mmol/L 142 145 144  Potassium 3.5 - 5.1 mmol/L 3.2(L) 3.2(L) 3.4(L)  Chloride 101 - 111 mmol/L 105 108 103  CO2 22 - 32 mmol/L 30 26 32  Calcium 8.9 - 10.3 mg/dL 7.6(L) 8.3(L) 9.2  Total Protein 6.5 - 8.1 g/dL - - 7.5  Total Bilirubin 0.3 - 1.2 mg/dL - - 0.5  Alkaline Phos 38 - 126 U/L - - 91  AST 15 - 41 U/L - - 22  ALT 14 - 54 U/L - - 22   A/P: 60 yr old female with enteritis. Patient improving. Would advance diet as tolerated. No need for acute surgical intervention.

## 2016-01-17 DIAGNOSIS — E039 Hypothyroidism, unspecified: Secondary | ICD-10-CM | POA: Diagnosis not present

## 2016-01-17 DIAGNOSIS — I10 Essential (primary) hypertension: Secondary | ICD-10-CM | POA: Diagnosis not present

## 2016-01-17 DIAGNOSIS — K219 Gastro-esophageal reflux disease without esophagitis: Secondary | ICD-10-CM | POA: Diagnosis not present

## 2016-01-17 DIAGNOSIS — R109 Unspecified abdominal pain: Secondary | ICD-10-CM | POA: Diagnosis not present

## 2016-01-17 DIAGNOSIS — K567 Ileus, unspecified: Secondary | ICD-10-CM | POA: Diagnosis not present

## 2016-01-17 NOTE — Discharge Summary (Signed)
[facility] [place] NAME: Brenda Barber    MR#:  [id number]  DATE OF BIRTH:  08-Aug-1955  DATE OF ADMISSION:  01/13/2016 ADMITTING PHYSICIAN: Lance Coon, MD  DATE OF DISCHARGE: 01/17/16  PRIMARY CARE PHYSICIAN: [name], MD    ADMISSION DIAGNOSIS:  Abdominal pain, unspecified abdominal location [R10.9] Non-intractable vomiting with nausea, vomiting of unspecified type [R11.2]  DISCHARGE DIAGNOSIS:  Principal Problem:   Abdominal pain  Ileus ([place]) enteritis Active Problems:   Hypothyroidism   Essential hypertension   GERD   Paroxysmal atrial fibrillation (HCC)   Obstructive apnea    SECONDARY DIAGNOSIS:   Past Medical History:  Diagnosis Date  . Allergy   . Cerebrovascular accident [facility])    History of visual deficit  . Depression   . GERD (gastroesophageal reflux disease)   . Hyperlipidemia   . Hypertension   . Hypoparathyroidism ([place])   . Personality disorder, depressive   . Thyroid cancer ([place]) 1999   Papillary  . Thyroid disease    hypothyroid     HOSPITAL COURSE:  Brenda Barber  is a 60 y.o. female admitted 01/13/2016 with chief complaint nausea, vomiting, abdominal pain. Please see H&P performed by Lance Coon, MD for further information. Patient presented with the above symptoms found to have evidence of enteritis with ileus on admission. She was evaluated by general surgery who determined no surgical issue. Treated symptomatically, slowly advanced diet. On day of discharge able to tolerate regular diet  DISCHARGE CONDITIONS:   stable  CONSULTS OBTAINED:  Treatment Team:  Jules Husbands, MD  DRUG ALLERGIES:   Allergies  Allergen Reactions  . Aspirin-Dipyridamole Er     REACTION: Headache  . Rosuvastatin     REACTION: Not effective    DISCHARGE MEDICATIONS:   Current Discharge Medication List    CONTINUE these medications which have NOT CHANGED   Details  ADVAIR DISKUS 250-50 MCG/DOSE AEPB  Place 2 puffs into the nose as needed. Refills: 1    albuterol (PROVENTIL HFA;VENTOLIN HFA) 108 (90 BASE) MCG/ACT inhaler Inhale 2 puffs into the lungs 4 (four) times daily as needed for wheezing or shortness of breath.    amiodarone (PACERONE) 200 MG tablet Take 1 tablet (200 mg total) by mouth daily. Qty: 14 tablet, Refills: 0    atenolol (TENORMIN) 50 MG tablet Take 50 mg by mouth daily.      atorvastatin (LIPITOR) 80 MG tablet Take 1 tablet by mouth daily.    calcitRIOL (ROCALTROL) 0.25 MCG capsule Take 2 capsules every morning and 1 capsule every evening.     clopidogrel (PLAVIX) 75 MG tablet Take 1 tablet by mouth daily.    fluticasone (VERAMYST) 27.5 MCG/SPRAY nasal spray Place 1 spray into the nose daily.    levothyroxine (SYNTHROID, LEVOTHROID) 150 MCG tablet Take 1 tablet by mouth 1 day or 1 dose.    loratadine (CLARITIN) 10 MG tablet Take 10 mg by mouth daily.    metoprolol (LOPRESSOR) 50 MG tablet Take 1 tablet by mouth daily.    mometasone (NASONEX) 50 MCG/ACT nasal spray Place 2 sprays into the nose daily as needed.      montelukast (SINGULAIR) 10 MG tablet Take 10 mg by mouth at bedtime.    nitroGLYCERIN (NITROSTAT) 0.4 MG SL tablet Place 1 tablet under the tongue as needed.    nortriptyline (PAMELOR) 10 MG capsule Take 2 capsules by mouth at bedtime.    ranitidine (ZANTAC) 150 MG tablet Take 150  mg by mouth 2 (two) times daily.      traMADol (ULTRAM) 50 MG tablet Take 2 tablets (100 mg total) by mouth once. Qty: 16 tablet, Refills: 0    traZODone (DESYREL) 50 MG tablet Take 50 mg by mouth at bedtime.        STOP taking these medications     nitrofurantoin, macrocrystal-monohydrate, (MACROBID) 100 MG capsule      simvastatin (ZOCOR) 20 MG tablet          DISCHARGE INSTRUCTIONS:    DIET:  Regular diet  DISCHARGE CONDITION:  Stable  ACTIVITY:  Activity as tolerated  OXYGEN:  Home Oxygen: No.   Oxygen Delivery: room air  DISCHARGE  LOCATION:  home   If you experience worsening of your admission symptoms, develop shortness of breath, life threatening emergency, suicidal or homicidal thoughts you must seek medical attention immediately by calling 911 or calling your MD immediately  if symptoms less severe.  You Must read complete instructions/literature along with all the possible adverse reactions/side effects for all the Medicines you take and that have been prescribed to you. Take any new Medicines after you have completely understood and accpet all the possible adverse reactions/side effects.   Please note  You were cared for by a hospitalist during your hospital stay. If you have any questions about your discharge medications or the care you received while you were in the hospital after you are discharged, you can call the unit and asked to speak with the hospitalist on call if the hospitalist that took care of you is not available. Once you are discharged, your primary care physician will handle any further medical issues. Please note that NO REFILLS for any discharge medications will be authorized once you are discharged, as it is imperative that you return to your primary care physician (or establish a relationship with a primary care physician if you do not have one) for your aftercare needs so that they can reassess your need for medications and monitor your lab values.    On the day of Discharge:   VITAL SIGNS:  Blood pressure (!) 169/96, pulse 74, temperature 97.9 F (36.6 C), temperature source Oral, resp. rate 17, height '5\' 3"'$  (1.6 m), weight 68.3 kg (150 lb 8 oz), SpO2 97 %.  I/O:   Intake/Output Summary (Last 24 hours) at 01/17/16 0925 Last data filed at 01/17/16 0500  Gross per 24 hour  Intake             2791 ml  Output             2150 ml  Net              641 ml    PHYSICAL EXAMINATION:  GENERAL:  60 y.o.-year-old patient lying in the bed with no acute distress.  EYES: Pupils equal, round,  reactive to light and accommodation. No scleral icterus. Extraocular muscles intact.  HEENT: Head atraumatic, normocephalic. Oropharynx and nasopharynx clear.  NECK:  Supple, no jugular venous distention. No thyroid enlargement, no tenderness.  LUNGS: Normal breath sounds bilaterally, no wheezing, rales,rhonchi or crepitation. No use of accessory muscles of respiration.  CARDIOVASCULAR: S1, S2 normal. No murmurs, rubs, or gallops.  ABDOMEN: Soft, non-tender, non-distended. Bowel sounds present. No organomegaly or mass.  EXTREMITIES: No pedal edema, cyanosis, or clubbing.  NEUROLOGIC: Cranial nerves II through XII are intact. Muscle strength 5/5 in all extremities. Sensation intact. Gait not checked.  PSYCHIATRIC: The patient is alert and oriented x  3.  SKIN: No obvious rash, lesion, or ulcer.   DATA REVIEW:   CBC  Recent Labs Lab 01/15/16 0809  WBC 8.6  HGB 11.7*  HCT 34.8*  PLT 229    Chemistries   Recent Labs Lab 01/13/16 1809  01/15/16 0809  NA 144  < > 142  K 3.4*  < > 3.2*  CL 103  < > 105  CO2 32  < > 30  GLUCOSE 117*  < > 104*  BUN 27*  < > 14  CREATININE 1.62*  < > 1.07*  CALCIUM 9.2  < > 7.6*  AST 22  --   --   ALT 22  --   --   ALKPHOS 91  --   --   BILITOT 0.5  --   --   < > = values in this interval not displayed.  Cardiac Enzymes  Recent Labs Lab 01/13/16 1809  TROPONINI <0.03    Microbiology Results  Results for orders placed or performed in visit on 01/10/12  Culture, blood (single)     Status: None   Collection Time: 01/10/12  8:51 AM  Result Value Ref Range Status   Micro Text Report   Final       COMMENT                   NO GROWTH AEROBICALLY/ANAEROBICALLY IN 5 DAYS   ANTIBIOTIC                                                      Culture, blood (single)     Status: None   Collection Time: 01/10/12  8:51 AM  Result Value Ref Range Status   Micro Text Report   Final       COMMENT                   NO GROWTH  AEROBICALLY/ANAEROBICALLY IN 5 DAYS   ANTIBIOTIC                                                      Influenza A&B Antigens [facility])     Status: None   Collection Time: 01/10/12  8:51 AM  Result Value Ref Range Status   Micro Text Report   Final       COMMENT                   NEGATIVE FOR INFLUENZA A (ANTIGEN ABSENT)   COMMENT                   NEGATIVE FOR INFLUENZA B (ANTIGEN ABSENT)   ANTIBIOTIC                                                      Culture, expectorated sputum-assessment     Status: None   Collection Time: 01/10/12  3:38 PM  Result Value Ref Range Status   Micro Text Report   Final       COMMENT  APPEARS TO BE NORMAL FLORA AT 48 HOURS   GRAM STAIN                EXCELLENT SPECIMEN-90-100% WBC   GRAM STAIN                MANY WHITE BLOOD CELLS   GRAM STAIN                FEW GRAM POSITIVE COCCI IN PAIRS   GRAM STAIN                FEW GRAM NEGATIVE ROD   ANTIBIOTIC                                                        RADIOLOGY:  Dg Abd Acute W/chest  Result Date: 01/15/2016 CLINICAL DATA:  Ileus EXAM: DG ABDOMEN ACUTE W/ 1V CHEST COMPARISON:  09/08/2015 FINDINGS: The heart size appears within normal limits. There is mild bibasilar atelectasis. A tortuous and unfolded thoracic aorta is noted. There is been progression of enteric contrast material from recent CT scan into the large bowel. Decrease in dilatation of the small bowel loops. IMPRESSION: 1. Resolving partial small bowel obstruction pattern. Electronically Signed   By: Kerby Moors M.D.   On: 01/15/2016 09:58     Management plans discussed with the patient, family and they are in agreement.  CODE STATUS:     Code Status Orders        Start     Ordered   01/14/16 0125  Full code  Continuous     01/14/16 0124    Code Status History    Date Active Date Inactive Code Status Order ID Comments User Context   This patient has a current code status but no historical code  status.    Advance Directive Documentation   [place] Most Recent Value  Type of Advance Directive  Living will  Pre-existing out of facility DNR order (yellow form or pink MOST form)  No data  "MOST" Form in Place?  No data      TOTAL TIME TAKING CARE OF THIS PATIENT: 33 minutes.    Kadie Balestrieri,  Karenann Cai.D on 01/17/2016 at 9:25 AM  Between 7am to 6pm - Pager - [phone]  After 6pm go to [email or web address] - [employer] [facility]  Office  [phone]  CC: Primary care physician; [name], MD

## 2016-01-17 NOTE — Progress Notes (Signed)
Patient discharged to home as ordered, IV discontinued site clean dry and intact. Patient denies pain at this time Patient ambulates without difficulty no acute distress noted.

## 2016-01-25 DIAGNOSIS — K5900 Constipation, unspecified: Secondary | ICD-10-CM | POA: Diagnosis not present

## 2016-01-25 DIAGNOSIS — R112 Nausea with vomiting, unspecified: Secondary | ICD-10-CM | POA: Diagnosis not present

## 2016-01-25 DIAGNOSIS — K5669 Other intestinal obstruction: Secondary | ICD-10-CM | POA: Diagnosis not present

## 2016-01-25 DIAGNOSIS — E890 Postprocedural hypothyroidism: Secondary | ICD-10-CM | POA: Diagnosis not present

## 2016-01-27 ENCOUNTER — Telehealth: Payer: Self-pay | Admitting: Emergency Medicine

## 2016-01-27 NOTE — Telephone Encounter (Signed)
called dr Ebony Hail with [facility] who is sending message for dr hande regarding increase size of lung nodule and need for follow up ct in 6-12  months

## 2016-01-27 NOTE — Telephone Encounter (Addendum)
Called patient to assure she understands need for follow up of lung nodule.  I left message with my number.  9/12--left another message with my number.  9/12--pt called me back. I explained need for follow up on lung nodule.  I also told her I have called and notified ashley at dr hande's office.

## 2016-02-03 DIAGNOSIS — E892 Postprocedural hypoparathyroidism: Secondary | ICD-10-CM | POA: Diagnosis not present

## 2016-02-03 DIAGNOSIS — E890 Postprocedural hypothyroidism: Secondary | ICD-10-CM | POA: Diagnosis not present

## 2016-02-10 DIAGNOSIS — E892 Postprocedural hypoparathyroidism: Secondary | ICD-10-CM | POA: Diagnosis not present

## 2016-02-10 DIAGNOSIS — E890 Postprocedural hypothyroidism: Secondary | ICD-10-CM | POA: Diagnosis not present

## 2016-02-10 DIAGNOSIS — Z85850 Personal history of malignant neoplasm of thyroid: Secondary | ICD-10-CM | POA: Diagnosis not present

## 2016-02-13 DIAGNOSIS — R0602 Shortness of breath: Secondary | ICD-10-CM | POA: Diagnosis not present

## 2016-02-13 DIAGNOSIS — G4733 Obstructive sleep apnea (adult) (pediatric): Secondary | ICD-10-CM | POA: Diagnosis not present

## 2016-02-13 DIAGNOSIS — R918 Other nonspecific abnormal finding of lung field: Secondary | ICD-10-CM | POA: Diagnosis not present

## 2016-02-13 DIAGNOSIS — R05 Cough: Secondary | ICD-10-CM | POA: Diagnosis not present

## 2016-02-13 DIAGNOSIS — J310 Chronic rhinitis: Secondary | ICD-10-CM | POA: Diagnosis not present

## 2016-02-16 DIAGNOSIS — R42 Dizziness and giddiness: Secondary | ICD-10-CM | POA: Diagnosis not present

## 2016-02-16 DIAGNOSIS — I4891 Unspecified atrial fibrillation: Secondary | ICD-10-CM | POA: Diagnosis not present

## 2016-02-16 DIAGNOSIS — I10 Essential (primary) hypertension: Secondary | ICD-10-CM | POA: Diagnosis not present

## 2016-02-16 DIAGNOSIS — I2510 Atherosclerotic heart disease of native coronary artery without angina pectoris: Secondary | ICD-10-CM | POA: Diagnosis not present

## 2016-02-16 DIAGNOSIS — E785 Hyperlipidemia, unspecified: Secondary | ICD-10-CM | POA: Diagnosis not present

## 2016-02-24 DIAGNOSIS — R2689 Other abnormalities of gait and mobility: Secondary | ICD-10-CM | POA: Diagnosis not present

## 2016-02-24 DIAGNOSIS — I480 Paroxysmal atrial fibrillation: Secondary | ICD-10-CM | POA: Diagnosis not present

## 2016-02-24 DIAGNOSIS — E890 Postprocedural hypothyroidism: Secondary | ICD-10-CM | POA: Diagnosis not present

## 2016-02-24 DIAGNOSIS — G4733 Obstructive sleep apnea (adult) (pediatric): Secondary | ICD-10-CM | POA: Diagnosis not present

## 2016-02-24 DIAGNOSIS — R51 Headache: Secondary | ICD-10-CM | POA: Diagnosis not present

## 2016-02-24 DIAGNOSIS — R918 Other nonspecific abnormal finding of lung field: Secondary | ICD-10-CM | POA: Diagnosis not present

## 2016-02-24 DIAGNOSIS — Z85850 Personal history of malignant neoplasm of thyroid: Secondary | ICD-10-CM | POA: Diagnosis not present

## 2016-02-24 DIAGNOSIS — G935 Compression of brain: Secondary | ICD-10-CM | POA: Diagnosis not present

## 2016-02-24 DIAGNOSIS — R7989 Other specified abnormal findings of blood chemistry: Secondary | ICD-10-CM | POA: Diagnosis not present

## 2016-02-27 DIAGNOSIS — R0602 Shortness of breath: Secondary | ICD-10-CM | POA: Diagnosis not present

## 2016-02-27 DIAGNOSIS — R42 Dizziness and giddiness: Secondary | ICD-10-CM | POA: Diagnosis not present

## 2016-02-27 DIAGNOSIS — E785 Hyperlipidemia, unspecified: Secondary | ICD-10-CM | POA: Diagnosis not present

## 2016-02-27 DIAGNOSIS — I10 Essential (primary) hypertension: Secondary | ICD-10-CM | POA: Diagnosis not present

## 2016-02-27 DIAGNOSIS — I4891 Unspecified atrial fibrillation: Secondary | ICD-10-CM | POA: Diagnosis not present

## 2016-02-27 DIAGNOSIS — R002 Palpitations: Secondary | ICD-10-CM | POA: Diagnosis not present

## 2016-02-27 DIAGNOSIS — I2510 Atherosclerotic heart disease of native coronary artery without angina pectoris: Secondary | ICD-10-CM | POA: Diagnosis not present

## 2016-03-02 DIAGNOSIS — N289 Disorder of kidney and ureter, unspecified: Secondary | ICD-10-CM | POA: Diagnosis not present

## 2016-03-02 DIAGNOSIS — R404 Transient alteration of awareness: Secondary | ICD-10-CM | POA: Diagnosis not present

## 2016-03-02 DIAGNOSIS — R51 Headache: Secondary | ICD-10-CM | POA: Diagnosis not present

## 2016-03-02 DIAGNOSIS — G935 Compression of brain: Secondary | ICD-10-CM | POA: Diagnosis not present

## 2016-03-02 DIAGNOSIS — I480 Paroxysmal atrial fibrillation: Secondary | ICD-10-CM | POA: Diagnosis not present

## 2016-03-02 DIAGNOSIS — E890 Postprocedural hypothyroidism: Secondary | ICD-10-CM | POA: Diagnosis not present

## 2016-03-02 DIAGNOSIS — R918 Other nonspecific abnormal finding of lung field: Secondary | ICD-10-CM | POA: Diagnosis not present

## 2016-03-02 DIAGNOSIS — D649 Anemia, unspecified: Secondary | ICD-10-CM | POA: Diagnosis not present

## 2016-03-02 DIAGNOSIS — G4733 Obstructive sleep apnea (adult) (pediatric): Secondary | ICD-10-CM | POA: Diagnosis not present

## 2016-03-08 DIAGNOSIS — E890 Postprocedural hypothyroidism: Secondary | ICD-10-CM | POA: Diagnosis not present

## 2016-03-08 DIAGNOSIS — R404 Transient alteration of awareness: Secondary | ICD-10-CM | POA: Diagnosis not present

## 2016-03-08 DIAGNOSIS — G935 Compression of brain: Secondary | ICD-10-CM | POA: Diagnosis not present

## 2016-03-08 DIAGNOSIS — G4733 Obstructive sleep apnea (adult) (pediatric): Secondary | ICD-10-CM | POA: Diagnosis not present

## 2016-03-08 DIAGNOSIS — N289 Disorder of kidney and ureter, unspecified: Secondary | ICD-10-CM | POA: Diagnosis not present

## 2016-03-08 DIAGNOSIS — R51 Headache: Secondary | ICD-10-CM | POA: Diagnosis not present

## 2016-03-08 DIAGNOSIS — D649 Anemia, unspecified: Secondary | ICD-10-CM | POA: Diagnosis not present

## 2016-03-08 DIAGNOSIS — R918 Other nonspecific abnormal finding of lung field: Secondary | ICD-10-CM | POA: Diagnosis not present

## 2016-03-08 DIAGNOSIS — I480 Paroxysmal atrial fibrillation: Secondary | ICD-10-CM | POA: Diagnosis not present

## 2016-03-26 DIAGNOSIS — S52591G Other fractures of lower end of right radius, subsequent encounter for closed fracture with delayed healing: Secondary | ICD-10-CM | POA: Diagnosis not present

## 2016-03-26 DIAGNOSIS — M1611 Unilateral primary osteoarthritis, right hip: Secondary | ICD-10-CM | POA: Diagnosis not present

## 2016-04-23 DIAGNOSIS — G4733 Obstructive sleep apnea (adult) (pediatric): Secondary | ICD-10-CM | POA: Diagnosis not present

## 2016-04-23 DIAGNOSIS — Z01818 Encounter for other preprocedural examination: Secondary | ICD-10-CM | POA: Diagnosis not present

## 2016-04-23 DIAGNOSIS — R918 Other nonspecific abnormal finding of lung field: Secondary | ICD-10-CM | POA: Diagnosis not present

## 2016-05-02 DIAGNOSIS — R404 Transient alteration of awareness: Secondary | ICD-10-CM | POA: Diagnosis not present

## 2016-05-02 DIAGNOSIS — G935 Compression of brain: Secondary | ICD-10-CM | POA: Diagnosis not present

## 2016-05-02 DIAGNOSIS — R569 Unspecified convulsions: Secondary | ICD-10-CM | POA: Diagnosis not present

## 2016-05-02 DIAGNOSIS — R51 Headache: Secondary | ICD-10-CM | POA: Diagnosis not present

## 2016-05-02 DIAGNOSIS — R296 Repeated falls: Secondary | ICD-10-CM | POA: Diagnosis not present

## 2016-05-04 DIAGNOSIS — E892 Postprocedural hypoparathyroidism: Secondary | ICD-10-CM | POA: Diagnosis not present

## 2016-05-04 DIAGNOSIS — Z85850 Personal history of malignant neoplasm of thyroid: Secondary | ICD-10-CM | POA: Diagnosis not present

## 2016-05-04 DIAGNOSIS — E890 Postprocedural hypothyroidism: Secondary | ICD-10-CM | POA: Diagnosis not present

## 2016-05-09 DIAGNOSIS — M1611 Unilateral primary osteoarthritis, right hip: Secondary | ICD-10-CM | POA: Diagnosis not present

## 2016-05-11 DIAGNOSIS — E892 Postprocedural hypoparathyroidism: Secondary | ICD-10-CM | POA: Diagnosis not present

## 2016-05-11 DIAGNOSIS — E890 Postprocedural hypothyroidism: Secondary | ICD-10-CM | POA: Diagnosis not present

## 2016-05-11 DIAGNOSIS — Z85850 Personal history of malignant neoplasm of thyroid: Secondary | ICD-10-CM | POA: Diagnosis not present

## 2016-05-17 DIAGNOSIS — G935 Compression of brain: Secondary | ICD-10-CM | POA: Diagnosis not present

## 2016-05-25 DIAGNOSIS — E785 Hyperlipidemia, unspecified: Secondary | ICD-10-CM | POA: Diagnosis not present

## 2016-05-25 DIAGNOSIS — R0602 Shortness of breath: Secondary | ICD-10-CM | POA: Diagnosis not present

## 2016-05-25 DIAGNOSIS — I4891 Unspecified atrial fibrillation: Secondary | ICD-10-CM | POA: Diagnosis not present

## 2016-05-25 DIAGNOSIS — I10 Essential (primary) hypertension: Secondary | ICD-10-CM | POA: Diagnosis not present

## 2016-05-25 DIAGNOSIS — I2510 Atherosclerotic heart disease of native coronary artery without angina pectoris: Secondary | ICD-10-CM | POA: Diagnosis not present

## 2016-05-30 ENCOUNTER — Encounter
Admission: RE | Admit: 2016-05-30 | Discharge: 2016-05-30 | Disposition: A | Discharge status: Home / Self Care | Payer: Medicare HMO | Source: Ambulatory Visit | Attending: Orthopedic Surgery | Admitting: Orthopedic Surgery

## 2016-05-30 DIAGNOSIS — Z01812 Encounter for preprocedural laboratory examination: Secondary | ICD-10-CM | POA: Diagnosis not present

## 2016-05-30 HISTORY — DX: Nausea with vomiting, unspecified: Z98.890

## 2016-05-30 HISTORY — DX: Unspecified atrial fibrillation: I48.91

## 2016-05-30 HISTORY — DX: Anemia, unspecified: D64.9

## 2016-05-30 HISTORY — DX: Atherosclerotic heart disease of native coronary artery without angina pectoris: I25.10

## 2016-05-30 HISTORY — DX: Hypothyroidism, unspecified: E03.9

## 2016-05-30 HISTORY — DX: Disorder of kidney and ureter, unspecified: N28.9

## 2016-05-30 HISTORY — DX: Other specified postprocedural states: R11.2

## 2016-05-30 HISTORY — DX: Compression of brain: G93.5

## 2016-05-30 HISTORY — DX: Dyspnea, unspecified: R06.00

## 2016-05-30 HISTORY — DX: Sleep apnea, unspecified: G47.30

## 2016-05-30 HISTORY — DX: Unspecified asthma, uncomplicated: J45.909

## 2016-05-30 LAB — BASIC METABOLIC PANEL WITH GFR
Anion gap: 12 (ref 5–15)
BUN: 21 mg/dL — ABNORMAL HIGH (ref 6–20)
CO2: 29 mmol/L (ref 22–32)
Calcium: 9.4 mg/dL (ref 8.9–10.3)
Chloride: 103 mmol/L (ref 101–111)
Creatinine, Ser: 1.36 mg/dL — ABNORMAL HIGH (ref 0.44–1.00)
GFR calc Af Amer: 48 mL/min — ABNORMAL LOW
GFR calc non Af Amer: 41 mL/min — ABNORMAL LOW
Glucose, Bld: 105 mg/dL — ABNORMAL HIGH (ref 65–99)
Potassium: 3.2 mmol/L — ABNORMAL LOW (ref 3.5–5.1)
Sodium: 144 mmol/L (ref 135–145)

## 2016-05-30 LAB — URINALYSIS, COMPLETE (UACMP) WITH MICROSCOPIC
Bacteria, UA: NONE SEEN
Bilirubin Urine: NEGATIVE
Glucose, UA: 50 mg/dL — AB
Hgb urine dipstick: NEGATIVE
Ketones, ur: 5 mg/dL — AB
Leukocytes, UA: NEGATIVE
Nitrite: NEGATIVE
Protein, ur: 100 mg/dL — AB
Specific Gravity, Urine: 1.026 (ref 1.005–1.030)
pH: 5 (ref 5.0–8.0)

## 2016-05-30 LAB — SURGICAL PCR SCREEN
MRSA, PCR: NEGATIVE
STAPHYLOCOCCUS AUREUS: NEGATIVE

## 2016-05-30 LAB — TYPE AND SCREEN
ABO/RH(D): O POS
Antibody Screen: NEGATIVE

## 2016-05-30 LAB — PROTIME-INR
INR: 0.9
PROTHROMBIN TIME: 12.1 s (ref 11.4–15.2)

## 2016-05-30 LAB — APTT: aPTT: 26 s (ref 24–36)

## 2016-05-30 NOTE — Pre-Procedure Instructions (Signed)
Notes from patients cardiologist and neurologist enclosed in chart

## 2016-05-30 NOTE — Patient Instructions (Signed)
Your procedure is scheduled on: Tuesday 06/06/15 Report to [facility]. 2ND FLOOR MEDICAL MALL ENTRANCE. To find out your arrival time please call [phone] between 1PM - 3PM on Monday 06/05/15.  Remember: Instructions that are not followed completely may result in serious medical risk, up to and including death, or upon the discretion of your surgeon and anesthesiologist your surgery may need to be rescheduled.    __X__ 1. Do not eat food or drink liquids after midnight. No gum chewing or hard candies.     __X__ 2. No Alcohol for 24 hours before or after surgery.   ____ 3. Bring all medications with you on the day of surgery if instructed.    __X__ 4. Notify your doctor if there is any change in your medical condition     (cold, fever, infections).             ___X__5. No smoking within 24 hours of your surgery.     Do not wear jewelry, make-up, hairpins, clips or nail polish.  Do not wear lotions, powders, or perfumes.   Do not shave 48 hours prior to surgery. Men may shave face and neck.  Do not bring valuables to the hospital.    [facility] is not responsible for any belongings or valuables.               Contacts, dentures or bridgework may not be worn into surgery.  Leave your suitcase in the car. After surgery it may be brought to your room.  For patients admitted to the hospital, discharge time is determined by your                treatment team.   Patients discharged the day of surgery will not be allowed to drive home.   Please read over the following fact sheets that you were given:   Pain Booklet and MRSA Information   __X__ Take these medicines the morning of surgery with A SIP OF WATER:    1. AMIODARONE  2. LEVOTHYROXINE  3. LISINOPRIL  4. METOPROLOL  5. NIACIN  6.  ____ Fleet Enema (as directed)   __X__ Use CHG Soap as directed  __X__ Use ALL inhalers on the day of surgery  ____ Stop metformin 2 days prior to surgery    ____ Take 1/2 of usual insulin  dose the night before surgery and none on the morning of surgery.   __X__ Stop Coumadin/Plavix/aspirin on ASPIRIN AND PLAVIX ACCORDING YO YOUR CARDIOLOGIST RECOMMENDATIONS  __X__ Stop Anti-inflammatories such as Advil, Aleve, Ibuprofen, Motrin, Naproxen, Naprosyn, Goodies,powder, or aspirin products.  OK to take Tylenol.   __X__ Stop supplements until after surgery.  VITAMIN C  __X__ Bring C-Pap to the hospital.

## 2016-05-31 LAB — URINE CULTURE

## 2016-05-31 NOTE — Pre-Procedure Instructions (Signed)
Lab results faxed to Dr Rudene Christians. Potassium supplement requested.

## 2016-06-01 DIAGNOSIS — R6889 Other general symptoms and signs: Secondary | ICD-10-CM | POA: Diagnosis not present

## 2016-06-01 NOTE — Pre-Procedure Instructions (Signed)
UA sent to Dr. Rudene Christians for review.

## 2016-06-14 MED ORDER — ONDANSETRON HCL 4 MG/2ML IJ SOLN
INTRAMUSCULAR | Status: AC
  Filled 2016-06-14: qty 2

## 2016-06-25 MED ORDER — CEFAZOLIN SODIUM-DEXTROSE 2-4 GM/100ML-% IV SOLN
2.0000 g | Freq: Once | INTRAVENOUS | Status: AC
Start: 2016-06-25 — End: 2016-06-26
  Administered 2016-06-26: 2 g via INTRAVENOUS

## 2016-06-26 ENCOUNTER — Inpatient Hospital Stay: Payer: Medicare HMO | Admitting: Anesthesiology

## 2016-06-26 ENCOUNTER — Inpatient Hospital Stay: Payer: Medicare HMO

## 2016-06-26 ENCOUNTER — Inpatient Hospital Stay
Admission: RE | Admit: 2016-06-26 | Discharge: 2016-06-29 | DRG: 470 | Disposition: A | Discharge status: Home / Self Care | Payer: Medicare HMO | Source: Ambulatory Visit | Attending: Orthopedic Surgery | Admitting: Orthopedic Surgery

## 2016-06-26 ENCOUNTER — Encounter
Admission: RE | Disposition: A | Discharge status: Home / Self Care | Payer: Self-pay | Source: Ambulatory Visit | Attending: Orthopedic Surgery

## 2016-06-26 ENCOUNTER — Encounter: Payer: Self-pay | Admitting: *Deleted

## 2016-06-26 DIAGNOSIS — E785 Hyperlipidemia, unspecified: Secondary | ICD-10-CM | POA: Diagnosis present

## 2016-06-26 DIAGNOSIS — I10 Essential (primary) hypertension: Secondary | ICD-10-CM | POA: Diagnosis present

## 2016-06-26 DIAGNOSIS — Z5189 Encounter for other specified aftercare: Secondary | ICD-10-CM | POA: Diagnosis not present

## 2016-06-26 DIAGNOSIS — E039 Hypothyroidism, unspecified: Secondary | ICD-10-CM | POA: Diagnosis present

## 2016-06-26 DIAGNOSIS — G464 Cerebellar stroke syndrome: Secondary | ICD-10-CM | POA: Diagnosis not present

## 2016-06-26 DIAGNOSIS — R110 Nausea: Secondary | ICD-10-CM | POA: Diagnosis not present

## 2016-06-26 DIAGNOSIS — R569 Unspecified convulsions: Secondary | ICD-10-CM | POA: Diagnosis not present

## 2016-06-26 DIAGNOSIS — M1611 Unilateral primary osteoarthritis, right hip: Secondary | ICD-10-CM | POA: Diagnosis not present

## 2016-06-26 DIAGNOSIS — J45909 Unspecified asthma, uncomplicated: Secondary | ICD-10-CM | POA: Diagnosis present

## 2016-06-26 DIAGNOSIS — K219 Gastro-esophageal reflux disease without esophagitis: Secondary | ICD-10-CM | POA: Diagnosis present

## 2016-06-26 DIAGNOSIS — M6281 Muscle weakness (generalized): Secondary | ICD-10-CM

## 2016-06-26 DIAGNOSIS — Z471 Aftercare following joint replacement surgery: Secondary | ICD-10-CM | POA: Diagnosis not present

## 2016-06-26 DIAGNOSIS — Z79899 Other long term (current) drug therapy: Secondary | ICD-10-CM | POA: Diagnosis not present

## 2016-06-26 DIAGNOSIS — I2510 Atherosclerotic heart disease of native coronary artery without angina pectoris: Secondary | ICD-10-CM | POA: Diagnosis present

## 2016-06-26 DIAGNOSIS — Z7401 Bed confinement status: Secondary | ICD-10-CM | POA: Diagnosis not present

## 2016-06-26 DIAGNOSIS — I4891 Unspecified atrial fibrillation: Secondary | ICD-10-CM | POA: Diagnosis not present

## 2016-06-26 DIAGNOSIS — D62 Acute posthemorrhagic anemia: Secondary | ICD-10-CM | POA: Diagnosis not present

## 2016-06-26 DIAGNOSIS — G4730 Sleep apnea, unspecified: Secondary | ICD-10-CM | POA: Diagnosis present

## 2016-06-26 DIAGNOSIS — G8918 Other acute postprocedural pain: Secondary | ICD-10-CM

## 2016-06-26 DIAGNOSIS — R498 Other voice and resonance disorders: Secondary | ICD-10-CM | POA: Diagnosis not present

## 2016-06-26 DIAGNOSIS — Z96641 Presence of right artificial hip joint: Secondary | ICD-10-CM | POA: Diagnosis not present

## 2016-06-26 DIAGNOSIS — E876 Hypokalemia: Secondary | ICD-10-CM | POA: Diagnosis not present

## 2016-06-26 DIAGNOSIS — M25559 Pain in unspecified hip: Secondary | ICD-10-CM | POA: Diagnosis not present

## 2016-06-26 DIAGNOSIS — Z85850 Personal history of malignant neoplasm of thyroid: Secondary | ICD-10-CM

## 2016-06-26 DIAGNOSIS — Z7902 Long term (current) use of antithrombotics/antiplatelets: Secondary | ICD-10-CM | POA: Diagnosis not present

## 2016-06-26 DIAGNOSIS — R262 Difficulty in walking, not elsewhere classified: Secondary | ICD-10-CM | POA: Diagnosis not present

## 2016-06-26 DIAGNOSIS — Z419 Encounter for procedure for purposes other than remedying health state, unspecified: Secondary | ICD-10-CM

## 2016-06-26 DIAGNOSIS — Z4801 Encounter for change or removal of surgical wound dressing: Secondary | ICD-10-CM | POA: Diagnosis not present

## 2016-06-26 DIAGNOSIS — D649 Anemia, unspecified: Secondary | ICD-10-CM | POA: Diagnosis not present

## 2016-06-26 HISTORY — PX: TOTAL HIP ARTHROPLASTY: SHX124

## 2016-06-26 LAB — CBC
HEMATOCRIT: 33.2 % — AB (ref 35.0–47.0)
HEMOGLOBIN: 11.2 g/dL — AB (ref 12.0–16.0)
MCH: 28.3 pg (ref 26.0–34.0)
MCHC: 33.8 g/dL (ref 32.0–36.0)
MCV: 83.7 fL (ref 80.0–100.0)
Platelets: 215 10*3/uL (ref 150–440)
RBC: 3.97 MIL/uL (ref 3.80–5.20)
RDW: 16.4 % — ABNORMAL HIGH (ref 11.5–14.5)
WBC: 7.5 10*3/uL (ref 3.6–11.0)

## 2016-06-26 LAB — POCT I-STAT 4, (NA,K, GLUC, HGB,HCT)
Glucose, Bld: 93 mg/dL (ref 65–99)
HCT: 32 % — ABNORMAL LOW (ref 36.0–46.0)
HEMOGLOBIN: 10.9 g/dL — AB (ref 12.0–15.0)
Potassium: 3.5 mmol/L (ref 3.5–5.1)
Sodium: 143 mmol/L (ref 135–145)

## 2016-06-26 LAB — TYPE AND SCREEN
ABO/RH(D): O POS
Antibody Screen: NEGATIVE

## 2016-06-26 LAB — SEDIMENTATION RATE: Sed Rate: 61 mm/hr — ABNORMAL HIGH (ref 0–30)

## 2016-06-26 SURGERY — ARTHROPLASTY, HIP, TOTAL, ANTERIOR APPROACH
Anesthesia: Spinal | Site: Hip | Laterality: Right | Wound class: Clean

## 2016-06-26 MED ORDER — NITROGLYCERIN 0.4 MG SL SUBL: 0.4000 mg | SUBLINGUAL_TABLET | SUBLINGUAL | Status: DC | PRN

## 2016-06-26 MED ORDER — LACTATED RINGERS IV SOLN
INTRAVENOUS | Status: DC
  Administered 2016-06-26 (×2): via INTRAVENOUS

## 2016-06-26 MED ORDER — ALBUTEROL SULFATE (2.5 MG/3ML) 0.083% IN NEBU
3.0000 mL | INHALATION_SOLUTION | Freq: Four times a day (QID) | RESPIRATORY_TRACT | Status: DC | PRN
  Administered 2016-06-29: 3 mL via RESPIRATORY_TRACT
  Filled 2016-06-26: qty 3

## 2016-06-26 MED ORDER — METHOCARBAMOL 1000 MG/10ML IJ SOLN
500.0000 mg | Freq: Four times a day (QID) | INTRAVENOUS | Status: DC | PRN
  Filled 2016-06-26: qty 5

## 2016-06-26 MED ORDER — PROPOFOL 500 MG/50ML IV EMUL
INTRAVENOUS | Status: AC
  Filled 2016-06-26: qty 50

## 2016-06-26 MED ORDER — ASPIRIN EC 325 MG PO TBEC
325.0000 mg | DELAYED_RELEASE_TABLET | Freq: Every day | ORAL | Status: DC
  Administered 2016-06-28 – 2016-06-29 (×2): 325 mg via ORAL
  Filled 2016-06-26 (×2): qty 1

## 2016-06-26 MED ORDER — TRANEXAMIC ACID 1000 MG/10ML IV SOLN
INTRAVENOUS | Status: DC | PRN
  Administered 2016-06-26: 1000 mg via INTRAVENOUS

## 2016-06-26 MED ORDER — VITAMIN C 500 MG PO TABS
500.0000 mg | ORAL_TABLET | Freq: Every day | ORAL | Status: DC
  Administered 2016-06-28 – 2016-06-29 (×2): 500 mg via ORAL
  Filled 2016-06-26 (×2): qty 1

## 2016-06-26 MED ORDER — CALCITRIOL 0.25 MCG PO CAPS
0.2500 ug | ORAL_CAPSULE | Freq: Every evening | ORAL | Status: DC
  Administered 2016-06-26 – 2016-06-27 (×2): 0.25 ug via ORAL
  Filled 2016-06-26 (×4): qty 1

## 2016-06-26 MED ORDER — NEOMYCIN-POLYMYXIN B GU 40-200000 IR SOLN
Status: DC | PRN
  Administered 2016-06-26: 4 mL

## 2016-06-26 MED ORDER — LIDOCAINE HCL 2 % EX GEL
CUTANEOUS | Status: AC
  Filled 2016-06-26: qty 5

## 2016-06-26 MED ORDER — MIDAZOLAM HCL 5 MG/5ML IJ SOLN
INTRAMUSCULAR | Status: DC | PRN
  Administered 2016-06-26: 2 mg via INTRAVENOUS

## 2016-06-26 MED ORDER — METOCLOPRAMIDE HCL 10 MG PO TABS: 5.0000 mg | ORAL_TABLET | Freq: Three times a day (TID) | ORAL | Status: DC | PRN

## 2016-06-26 MED ORDER — ADULT MULTIVITAMIN W/MINERALS CH
1.0000 | ORAL_TABLET | Freq: Every day | ORAL | Status: DC
  Administered 2016-06-28: 1 via ORAL
  Filled 2016-06-26: qty 1

## 2016-06-26 MED ORDER — CALCITRIOL 0.5 MCG PO CAPS
0.5000 ug | ORAL_CAPSULE | Freq: Every morning | ORAL | Status: DC
  Administered 2016-06-29: 0.5 ug via ORAL
  Filled 2016-06-26 (×3): qty 1

## 2016-06-26 MED ORDER — LIDOCAINE HCL (CARDIAC) 20 MG/ML IV SOLN
INTRAVENOUS | Status: DC | PRN
  Administered 2016-06-26: 100 mg via INTRAVENOUS

## 2016-06-26 MED ORDER — ACETAMINOPHEN 325 MG PO TABS
650.0000 mg | ORAL_TABLET | Freq: Four times a day (QID) | ORAL | Status: DC | PRN
  Administered 2016-06-27 – 2016-06-29 (×4): 650 mg via ORAL
  Filled 2016-06-26 (×5): qty 2

## 2016-06-26 MED ORDER — SODIUM CHLORIDE 0.9 % IV SOLN
INTRAVENOUS | Status: DC
  Administered 2016-06-27: 100 mL/h via INTRAVENOUS
  Administered 2016-06-27: 16:00:00 via INTRAVENOUS

## 2016-06-26 MED ORDER — FLUTICASONE PROPIONATE 50 MCG/ACT NA SUSP
1.0000 | Freq: Every day | NASAL | Status: DC
  Administered 2016-06-27 – 2016-06-29 (×3): 1 via NASAL
  Filled 2016-06-26: qty 16

## 2016-06-26 MED ORDER — LEVOTHYROXINE SODIUM 150 MCG PO TABS
150.0000 ug | ORAL_TABLET | Freq: Every day | ORAL | Status: DC
  Administered 2016-06-28 – 2016-06-29 (×2): 150 ug via ORAL
  Filled 2016-06-26 (×2): qty 1

## 2016-06-26 MED ORDER — NIACIN 500 MG PO TABS
500.0000 mg | ORAL_TABLET | Freq: Every day | ORAL | Status: DC
  Administered 2016-06-27 – 2016-06-29 (×2): 500 mg via ORAL
  Filled 2016-06-26 (×3): qty 1

## 2016-06-26 MED ORDER — CEFAZOLIN IN D5W 1 GM/50ML IV SOLN
1.0000 g | Freq: Four times a day (QID) | INTRAVENOUS | Status: AC
  Administered 2016-06-26 – 2016-06-27 (×3): 1 g via INTRAVENOUS
  Filled 2016-06-26 (×3): qty 50

## 2016-06-26 MED ORDER — METOCLOPRAMIDE HCL 5 MG/ML IJ SOLN: 5.0000 mg | Freq: Three times a day (TID) | INTRAMUSCULAR | Status: DC | PRN

## 2016-06-26 MED ORDER — BUPIVACAINE HCL (PF) 0.5 % IJ SOLN
INTRAMUSCULAR | Status: DC | PRN
  Administered 2016-06-26: 3 mL

## 2016-06-26 MED ORDER — BUDESONIDE 0.5 MG/2ML IN SUSP
0.5000 mg | Freq: Two times a day (BID) | RESPIRATORY_TRACT | Status: DC
  Administered 2016-06-26 – 2016-06-29 (×6): 0.5 mg via RESPIRATORY_TRACT
  Filled 2016-06-26 (×7): qty 2

## 2016-06-26 MED ORDER — PROPOFOL 10 MG/ML IV BOLUS
INTRAVENOUS | Status: AC
  Filled 2016-06-26: qty 20

## 2016-06-26 MED ORDER — CALCITRIOL 0.25 MCG PO CAPS: 0.2500 ug | ORAL_CAPSULE | Freq: Two times a day (BID) | ORAL | Status: DC

## 2016-06-26 MED ORDER — BUPIVACAINE-EPINEPHRINE (PF) 0.25% -1:200000 IJ SOLN
INTRAMUSCULAR | Status: AC
  Filled 2016-06-26: qty 30

## 2016-06-26 MED ORDER — AZELASTINE HCL 0.1 % NA SOLN
1.0000 | Freq: Every day | NASAL | Status: DC
  Administered 2016-06-28 – 2016-06-29 (×2): 1 via NASAL
  Filled 2016-06-26: qty 30

## 2016-06-26 MED ORDER — METOPROLOL TARTRATE 50 MG PO TABS
50.0000 mg | ORAL_TABLET | Freq: Two times a day (BID) | ORAL | Status: DC
  Administered 2016-06-26 – 2016-06-29 (×4): 50 mg via ORAL
  Filled 2016-06-26 (×5): qty 1

## 2016-06-26 MED ORDER — ONDANSETRON HCL 4 MG PO TABS: 4.0000 mg | ORAL_TABLET | Freq: Four times a day (QID) | ORAL | Status: DC | PRN

## 2016-06-26 MED ORDER — MAGNESIUM HYDROXIDE 400 MG/5ML PO SUSP
30.0000 mL | Freq: Every day | ORAL | Status: DC | PRN
  Administered 2016-06-27: 30 mL via ORAL
  Filled 2016-06-26 (×2): qty 30

## 2016-06-26 MED ORDER — CLOPIDOGREL BISULFATE 75 MG PO TABS
75.0000 mg | ORAL_TABLET | Freq: Every day | ORAL | Status: DC
  Administered 2016-06-26 – 2016-06-29 (×4): 75 mg via ORAL
  Filled 2016-06-26 (×4): qty 1

## 2016-06-26 MED ORDER — MAGNESIUM CITRATE PO SOLN
1.0000 | Freq: Once | ORAL | Status: DC | PRN
  Filled 2016-06-26: qty 296

## 2016-06-26 MED ORDER — MIDAZOLAM HCL 2 MG/2ML IJ SOLN
INTRAMUSCULAR | Status: AC
  Filled 2016-06-26: qty 2

## 2016-06-26 MED ORDER — PROPOFOL 500 MG/50ML IV EMUL
INTRAVENOUS | Status: DC | PRN
  Administered 2016-06-26: 100 ug/kg/min via INTRAVENOUS

## 2016-06-26 MED ORDER — ZOLPIDEM TARTRATE 5 MG PO TABS: 5.0000 mg | ORAL_TABLET | Freq: Every evening | ORAL | Status: DC | PRN

## 2016-06-26 MED ORDER — SODIUM CHLORIDE 0.9 % IV SOLN
INTRAVENOUS | Status: DC | PRN
  Administered 2016-06-26: 50 ug/min via INTRAVENOUS

## 2016-06-26 MED ORDER — MOMETASONE FURO-FORMOTEROL FUM 200-5 MCG/ACT IN AERO
2.0000 | INHALATION_SPRAY | Freq: Two times a day (BID) | RESPIRATORY_TRACT | Status: DC
  Administered 2016-06-26 – 2016-06-29 (×6): 2 via RESPIRATORY_TRACT
  Filled 2016-06-26: qty 8.8

## 2016-06-26 MED ORDER — OXYCODONE HCL 5 MG PO TABS
5.0000 mg | ORAL_TABLET | ORAL | Status: DC | PRN
  Administered 2016-06-26: 5 mg via ORAL
  Filled 2016-06-26 (×2): qty 1

## 2016-06-26 MED ORDER — ATORVASTATIN CALCIUM 20 MG PO TABS
80.0000 mg | ORAL_TABLET | Freq: Every day | ORAL | Status: DC
  Administered 2016-06-29: 80 mg via ORAL
  Filled 2016-06-26: qty 4

## 2016-06-26 MED ORDER — BISACODYL 10 MG RE SUPP
10.0000 mg | Freq: Every day | RECTAL | Status: DC | PRN
  Administered 2016-06-29: 10 mg via RECTAL
  Filled 2016-06-26: qty 1

## 2016-06-26 MED ORDER — FENTANYL CITRATE (PF) 100 MCG/2ML IJ SOLN
INTRAMUSCULAR | Status: DC | PRN
  Administered 2016-06-26: 50 ug via INTRAVENOUS

## 2016-06-26 MED ORDER — METHOCARBAMOL 500 MG PO TABS: 500.0000 mg | ORAL_TABLET | Freq: Four times a day (QID) | ORAL | Status: DC | PRN

## 2016-06-26 MED ORDER — PHENOL 1.4 % MT LIQD
1.0000 | OROMUCOSAL | Status: DC | PRN
  Filled 2016-06-26: qty 177

## 2016-06-26 MED ORDER — LISINOPRIL 5 MG PO TABS
5.0000 mg | ORAL_TABLET | Freq: Every day | ORAL | Status: DC
  Administered 2016-06-27 – 2016-06-29 (×2): 5 mg via ORAL
  Filled 2016-06-26 (×3): qty 1

## 2016-06-26 MED ORDER — PHENYLEPHRINE HCL 10 MG/ML IJ SOLN
INTRAMUSCULAR | Status: DC | PRN
  Administered 2016-06-26 (×2): 100 ug via INTRAVENOUS
  Administered 2016-06-26: 200 ug via INTRAVENOUS

## 2016-06-26 MED ORDER — GLYCOPYRROLATE 0.2 MG/ML IJ SOLN
INTRAMUSCULAR | Status: DC | PRN
  Administered 2016-06-26: 0.2 mg via INTRAVENOUS

## 2016-06-26 MED ORDER — MENTHOL 3 MG MT LOZG
1.0000 | LOZENGE | OROMUCOSAL | Status: DC | PRN
  Filled 2016-06-26: qty 9

## 2016-06-26 MED ORDER — ONDANSETRON HCL 4 MG/2ML IJ SOLN
4.0000 mg | Freq: Four times a day (QID) | INTRAMUSCULAR | Status: DC | PRN
  Administered 2016-06-26 – 2016-06-27 (×3): 4 mg via INTRAVENOUS
  Filled 2016-06-26 (×3): qty 2

## 2016-06-26 MED ORDER — FAMOTIDINE 20 MG PO TABS
20.0000 mg | ORAL_TABLET | Freq: Once | ORAL | Status: AC
  Administered 2016-06-26: 20 mg via ORAL

## 2016-06-26 MED ORDER — DOCUSATE SODIUM 100 MG PO CAPS
100.0000 mg | ORAL_CAPSULE | Freq: Two times a day (BID) | ORAL | Status: DC
  Administered 2016-06-26 – 2016-06-29 (×4): 100 mg via ORAL
  Filled 2016-06-26 (×5): qty 1

## 2016-06-26 MED ORDER — DIPHENHYDRAMINE HCL 12.5 MG/5ML PO ELIX: 12.5000 mg | ORAL_SOLUTION | ORAL | Status: DC | PRN

## 2016-06-26 MED ORDER — BUPIVACAINE-EPINEPHRINE 0.25% -1:200000 IJ SOLN
INTRAMUSCULAR | Status: DC | PRN
  Administered 2016-06-26: 30 mL

## 2016-06-26 MED ORDER — MORPHINE SULFATE (PF) 2 MG/ML IV SOLN
2.0000 mg | INTRAVENOUS | Status: DC | PRN
  Administered 2016-06-26: 1 mg via INTRAVENOUS
  Filled 2016-06-26: qty 1

## 2016-06-26 MED ORDER — MONTELUKAST SODIUM 10 MG PO TABS
10.0000 mg | ORAL_TABLET | Freq: Every day | ORAL | Status: DC
  Administered 2016-06-26 – 2016-06-27 (×2): 10 mg via ORAL
  Filled 2016-06-26 (×3): qty 1

## 2016-06-26 MED ORDER — ATENOLOL 50 MG PO TABS: 50.0000 mg | ORAL_TABLET | Freq: Every day | ORAL | Status: DC

## 2016-06-26 MED ORDER — FENTANYL CITRATE (PF) 100 MCG/2ML IJ SOLN
INTRAMUSCULAR | Status: AC
  Filled 2016-06-26: qty 2

## 2016-06-26 MED ORDER — TRAZODONE HCL 50 MG PO TABS: 50.0000 mg | ORAL_TABLET | Freq: Every evening | ORAL | Status: DC | PRN

## 2016-06-26 MED ORDER — AMIODARONE HCL 200 MG PO TABS
200.0000 mg | ORAL_TABLET | Freq: Every day | ORAL | Status: DC
  Administered 2016-06-27: 200 mg via ORAL
  Filled 2016-06-26 (×3): qty 1

## 2016-06-26 MED ORDER — ACETAMINOPHEN 650 MG RE SUPP: 650.0000 mg | Freq: Four times a day (QID) | RECTAL | Status: DC | PRN

## 2016-06-26 MED ORDER — ALUM & MAG HYDROXIDE-SIMETH 200-200-20 MG/5ML PO SUSP: 30.0000 mL | ORAL | Status: DC | PRN

## 2016-06-26 MED ORDER — NEOMYCIN-POLYMYXIN B GU 40-200000 IR SOLN
Status: AC
Start: 2016-06-26 — End: 2016-06-26
  Filled 2016-06-26: qty 4

## 2016-06-26 SURGICAL SUPPLY — 45 items
BLADE SAW SAG 18.5X105 (BLADE) ×2 IMPLANT
BNDG COHESIVE 6X5 TAN STRL LF (GAUZE/BANDAGES/DRESSINGS) ×4 IMPLANT
CANISTER SUCT 1200ML W/VALVE (MISCELLANEOUS) ×2 IMPLANT
CAPT HIP TOTAL 3 ×1 IMPLANT
CATH FOL LEG HOLDER (MISCELLANEOUS) ×2 IMPLANT
CATH TRAY METER 16FR LF (MISCELLANEOUS) ×2 IMPLANT
CHLORAPREP W/TINT 26ML (MISCELLANEOUS) ×2 IMPLANT
DRAPE C-ARM XRAY 36X54 (DRAPES) ×2 IMPLANT
DRAPE INCISE IOBAN 66X60 STRL (DRAPES) IMPLANT
DRAPE POUCH INSTRU U-SHP 10X18 (DRAPES) ×2 IMPLANT
DRAPE SHEET LG 3/4 BI-LAMINATE (DRAPES) ×6 IMPLANT
DRAPE TABLE BACK 80X90 (DRAPES) ×2 IMPLANT
DRSG OPSITE POSTOP 4X8 (GAUZE/BANDAGES/DRESSINGS) ×4 IMPLANT
ELECT BLADE 6.5 EXT (BLADE) ×2 IMPLANT
ELECT REM PT RETURN 9FT ADLT (ELECTROSURGICAL) ×2
ELECTRODE REM PT RTRN 9FT ADLT (ELECTROSURGICAL) ×1 IMPLANT
GLOVE BIOGEL PI IND STRL 9 (GLOVE) ×1 IMPLANT
GLOVE BIOGEL PI INDICATOR 9 (GLOVE) ×1
GLOVE SURG SYN 9.0  PF PI (GLOVE) ×2
GLOVE SURG SYN 9.0 PF PI (GLOVE) ×2 IMPLANT
GOWN SRG 2XL LVL 4 RGLN SLV (GOWNS) ×1 IMPLANT
GOWN STRL NON-REIN 2XL LVL4 (GOWNS) ×2
GOWN STRL REUS W/ TWL LRG LVL3 (GOWN DISPOSABLE) ×1 IMPLANT
GOWN STRL REUS W/TWL LRG LVL3 (GOWN DISPOSABLE) ×2
HEMOVAC 400CC 10FR (MISCELLANEOUS) IMPLANT
HOOD PEEL AWAY FLYTE STAYCOOL (MISCELLANEOUS) ×2 IMPLANT
MAT BLUE FLOOR 46X72 FLO (MISCELLANEOUS) ×2 IMPLANT
NDL SAFETY 18GX1.5 (NEEDLE) ×2 IMPLANT
NDL SPNL 18GX3.5 QUINCKE PK (NEEDLE) ×1 IMPLANT
NEEDLE SPNL 18GX3.5 QUINCKE PK (NEEDLE) ×2 IMPLANT
NS IRRIG 1000ML POUR BTL (IV SOLUTION) ×2 IMPLANT
PACK HIP COMPR (MISCELLANEOUS) ×2 IMPLANT
SOL PREP PVP 2OZ (MISCELLANEOUS) ×2
SOLUTION PREP PVP 2OZ (MISCELLANEOUS) ×1 IMPLANT
STAPLER SKIN PROX 35W (STAPLE) ×2 IMPLANT
STRAP SAFETY BODY (MISCELLANEOUS) ×2 IMPLANT
SUT DVC 2 QUILL PDO  T11 36X36 (SUTURE) ×1
SUT DVC 2 QUILL PDO T11 36X36 (SUTURE) ×1 IMPLANT
SUT SILK 0 (SUTURE) ×2
SUT SILK 0 30XBRD TIE 6 (SUTURE) ×1 IMPLANT
SUT V-LOC 90 ABS DVC 3-0 CL (SUTURE) ×2 IMPLANT
SUT VIC AB 1 CT1 36 (SUTURE) ×2 IMPLANT
SYR 20CC LL (SYRINGE) ×2 IMPLANT
SYR 30ML LL (SYRINGE) ×2 IMPLANT
TOWEL OR 17X26 4PK STRL BLUE (TOWEL DISPOSABLE) ×2 IMPLANT

## 2016-06-26 NOTE — NC FL2 (Signed)
[place] LEVEL OF CARE SCREENING TOOL     IDENTIFICATION  Patient Name: Brenda Barber Birthdate: 08-Feb-1956 Sex: female Admission Date (Current Location): 06/26/2016  [place] and [place] Number:  Engineering geologist and Address:  [facility], [address] [place], [place] [address]      Provider Number: [id number]  Attending Physician Name and Address:  Hessie Knows, MD  Relative Name and Phone Number:       Current Level of Care: Hospital Recommended Level of Care: [place] Prior Approval Number:    Date Approved/Denied:   PASRR Number:  [id number])  Discharge Plan: SNF    Current Diagnoses: Patient Active Problem List   Diagnosis Date Noted  . Primary localized osteoarthritis of right hip 06/26/2016  . Ileus ([place])   . Emesis   . Abdominal pain 01/13/2016  . Arnold-Chiari malformation, type I ([place]) 06/01/2015  . Paroxysmal atrial fibrillation ([place]) 06/01/2015  . Excessive falling 04/21/2015  . Seizure ([place]) 03/16/2015  . Transient alteration of awareness 03/16/2015  . Postprocedural hypoparathyroidism ([place]) 10/31/2014  . H/O malignant neoplasm of thyroid 04/26/2014  . Hypothyroidism, postop 04/26/2014  . Obstructive apnea 04/19/2014  . Lung nodule, multiple 11/09/2013  . Personality disorder, depressive 05/08/2011  . GERD 12/23/2009  . HYPERGLYCEMIA 12/23/2009  . 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 GASTROENTERITIS, VIRAL 08/01/2007  . Hypothyroidism 05/23/2007  . HYPOPARATHYROIDISM 08/12/2006  . HYPERLIPIDEMIA 08/12/2006  . CORNEAL DISORDER 08/12/2006  . Essential hypertension 08/12/2006  . ALLERGIC RHINITIS 08/12/2006  . REACTIVE AIRWAY DISEASE 08/12/2006  . POSTMENOPAUSAL STATUS 08/12/2006  . ROSACEA 08/12/2006  . COUGH, CHRONIC 08/12/2006  . CEREBROVASCULAR ACCIDENT, HX OF 08/12/2006  . MIGRAINES, HX OF 08/12/2006    Orientation RESPIRATION BLADDER Height & Weight     Self, Time, Situation,  Place  O2 (Nasal Cannula 2L/min ) External catheter Weight:   Height:     BEHAVIORAL SYMPTOMS/MOOD NEUROLOGICAL BOWEL NUTRITION STATUS   (None. )  (None. ) Incontinent Diet (Diet: Regular )  AMBULATORY STATUS COMMUNICATION OF NEEDS Skin   Extensive Assist Verbally Surgical wounds (Incision: Right Hip)                       Personal Care Assistance Level of Assistance  Bathing, Feeding, Dressing Bathing Assistance: Limited assistance Feeding assistance: Independent Dressing Assistance: Limited assistance     Functional Limitations Info  Sight, Hearing, Speech Sight Info: Adequate Hearing Info: Adequate Speech Info: Adequate    SPECIAL CARE FACTORS FREQUENCY  PT (By licensed PT), OT (By licensed OT)     PT Frequency:  (5) OT Frequency:  (5)            Contractures      Additional Factors Info  Code Status, Allergies Code Status Info:  (Full Code) Allergies Info:  (Aspirin-dipyridamole Er, Rosuvastatin)           Current Medications (06/26/2016):  This is the current hospital active medication list Current Facility-Administered Medications  Medication Dose Route Frequency Provider Last Rate Last Dose  . 0.9 %  sodium chloride infusion   Intravenous Continuous Hessie Knows, MD      . acetaminophen (TYLENOL) tablet 650 mg  650 mg Oral Q6H PRN Hessie Knows, MD       Or  . acetaminophen (TYLENOL) suppository 650 mg  650 mg Rectal Q6H PRN Hessie Knows, MD      . albuterol (PROVENTIL HFA;VENTOLIN HFA) 108 (90 Base) MCG/ACT inhaler  2 puff  2 puff Inhalation Q6H PRN Hessie Knows, MD      . alum & mag hydroxide-simeth (MAALOX/MYLANTA) 200-200-20 MG/5ML suspension 30 mL  30 mL Oral Q4H PRN Hessie Knows, MD      . amiodarone (PACERONE) tablet 200 mg  200 mg Oral Daily Hessie Knows, MD      . Derrill Memo ON 06/27/2016] aspirin EC tablet 325 mg  325 mg Oral Q breakfast Hessie Knows, MD      . atenolol (TENORMIN) tablet 50 mg  50 mg Oral Daily Hessie Knows, MD      . atorvastatin  (LIPITOR) tablet 80 mg  80 mg Oral QHS Hessie Knows, MD      . azelastine (ASTELIN) 0.1 % nasal spray 1 spray  1 spray Each Nare Daily Hessie Knows, MD      . bisacodyl (DULCOLAX) suppository 10 mg  10 mg Rectal Daily PRN Hessie Knows, MD      . budesonide (PULMICORT) nebulizer solution 0.5 mg  0.5 mg Nebulization BID Hessie Knows, MD      . calcitRIOL (ROCALTROL) capsule 0.25-0.5 mcg  0.25-0.5 mcg Oral BID Hessie Knows, MD      . ceFAZolin (ANCEF) IVPB 1 g/50 mL premix  1 g Intravenous Q6H Hessie Knows, MD      . clopidogrel (PLAVIX) tablet 75 mg  75 mg Oral Daily Hessie Knows, MD      . diphenhydrAMINE (BENADRYL) 12.5 MG/5ML elixir 12.5-25 mg  12.5-25 mg Oral Q4H PRN Hessie Knows, MD      . docusate sodium (COLACE) capsule 100 mg  100 mg Oral BID Hessie Knows, MD      . fluticasone [facility]) 50 MCG/ACT nasal spray 1 spray  1 spray Each Nare Daily Hessie Knows, MD      . Derrill Memo ON 06/27/2016] levothyroxine (SYNTHROID, LEVOTHROID) tablet 150 mcg  150 mcg Oral QAC breakfast Hessie Knows, MD      . lisinopril (PRINIVIL,ZESTRIL) tablet 5 mg  5 mg Oral Daily Hessie Knows, MD      . magnesium citrate solution 1 Bottle  1 Bottle Oral Once PRN Hessie Knows, MD      . magnesium hydroxide (MILK OF MAGNESIA) suspension 30 mL  30 mL Oral Daily PRN Hessie Knows, MD      . menthol-cetylpyridinium (CEPACOL) lozenge 3 mg  1 lozenge Oral PRN Hessie Knows, MD       Or  . phenol (CHLORASEPTIC) mouth spray 1 spray  1 spray Mouth/Throat PRN Hessie Knows, MD      . methocarbamol (ROBAXIN) tablet 500 mg  500 mg Oral Q6H PRN Hessie Knows, MD       Or  . methocarbamol (ROBAXIN) 500 mg in dextrose 5 % 50 mL IVPB  500 mg Intravenous Q6H PRN Hessie Knows, MD      . metoCLOPramide (REGLAN) tablet 5-10 mg  5-10 mg Oral Q8H PRN Hessie Knows, MD       Or  . metoCLOPramide (REGLAN) injection 5-10 mg  5-10 mg Intravenous Q8H PRN Hessie Knows, MD      . metoprolol (LOPRESSOR) tablet 50 mg  50 mg Oral BID Hessie Knows, MD      .  mometasone-formoterol [facility]) 200-5 MCG/ACT inhaler 2 puff  2 puff Inhalation BID Hessie Knows, MD      . montelukast (SINGULAIR) tablet 10 mg  10 mg Oral QHS Hessie Knows, MD      . morphine 2 MG/ML injection 2 mg  2 mg Intravenous  Q1H PRN Hessie Knows, MD      . multivitamin tablet 1 tablet  1 tablet Oral Daily Hessie Knows, MD      . niacin tablet 500 mg  500 mg Oral Daily Hessie Knows, MD      . nitroGLYCERIN (NITROSTAT) SL tablet 0.4 mg  0.4 mg Sublingual Q5 min PRN Hessie Knows, MD      . ondansetron [facility]) tablet 4 mg  4 mg Oral Q6H PRN Hessie Knows, MD       Or  . ondansetron [facility]) injection 4 mg  4 mg Intravenous Q6H PRN Hessie Knows, MD      . oxyCODONE (Oxy IR/ROXICODONE) immediate release tablet 5-10 mg  5-10 mg Oral Q3H PRN Hessie Knows, MD      . traZODone (DESYREL) tablet 50 mg  50 mg Oral QHS PRN Hessie Knows, MD      . vitamin C (ASCORBIC ACID) tablet 500 mg  500 mg Oral Daily Hessie Knows, MD      . zolpidem [facility]) tablet 5 mg  5 mg Oral QHS PRN Hessie Knows, MD         Discharge Medications: Please see discharge summary for a list of discharge medications.  Relevant Imaging Results:  Relevant Lab Results:   Additional Information  (SSN: [id number])  Danie Chandler, Student-Social Work

## 2016-06-26 NOTE — Anesthesia Preprocedure Evaluation (Signed)
Anesthesia Evaluation  Patient identified by MRN, date of birth, ID band Patient awake    Reviewed: Allergy & Precautions, NPO status , Unable to perform ROS - Chart review only  History of Anesthesia Complications (+) PONV and history of anesthetic complications  Airway Mallampati: III       Dental   Pulmonary asthma , sleep apnea and Continuous Positive Airway Pressure Ventilation ,           Cardiovascular hypertension, Pt. on medications and Pt. on home beta blockers + CAD       Neuro/Psych Seizures -, Well Controlled,  Depression CVA    GI/Hepatic GERD  Medicated and Controlled,  Endo/Other  Hypothyroidism   Renal/GU Renal InsufficiencyRenal disease     Musculoskeletal   Abdominal   Peds  Hematology  (+) anemia ,   Anesthesia Other Findings   Reproductive/Obstetrics                             Anesthesia Physical Anesthesia Plan  ASA: III  Anesthesia Plan: Spinal   Post-op Pain Management:    Induction:   Airway Management Planned:   Additional Equipment:   Intra-op Plan:   Post-operative Plan:   Informed Consent: I have reviewed the patients History and Physical, chart, labs and discussed the procedure including the risks, benefits and alternatives for the proposed anesthesia with the patient or authorized representative who has indicated his/her understanding and acceptance.     Plan Discussed with:   Anesthesia Plan Comments:         Anesthesia Quick Evaluation

## 2016-06-26 NOTE — Anesthesia Procedure Notes (Signed)
Spinal  Patient location during procedure: OR Start time: 06/26/2016 12:55 PM End time: 06/26/2016 1:00 PM Staffing Resident/CRNA: Nelda Marseille Performed: resident/CRNA  Preanesthetic Checklist Completed: patient identified, site marked, surgical consent, pre-op evaluation, timeout performed, IV checked, risks and benefits discussed and monitors and equipment checked Spinal Block Patient position: sitting Prep: Betadine Patient monitoring: heart rate, continuous pulse ox, blood pressure and cardiac monitor Approach: midline Location: L3-4 Injection technique: single-shot Needle Needle type: Whitacre and Introducer  Needle gauge: 24 G Needle length: 9 cm Additional Notes Negative paresthesia. Negative blood return. Positive free-flowing CSF. Expiration date of kit checked and confirmed. Patient tolerated procedure well, without complications.

## 2016-06-26 NOTE — Anesthesia Post-op Follow-up Note (Cosign Needed)
Anesthesia QCDR form completed.        

## 2016-06-26 NOTE — Progress Notes (Signed)
Patient is admitted to room 160 from surgery. NSL in place, along with SCDs and TEDS. Foley in place, draining clear yellow urine. Oriented to room, call light, TV, and bed controls. Bed alarm on for safety. On 2L, O2.

## 2016-06-26 NOTE — Op Note (Signed)
06/26/2016  2:18 PM  PATIENT:  Brenda Barber  61 y.o. female  PRE-OPERATIVE DIAGNOSIS:  PRIMARY OSTEOARTHRITIS RIGHT HIP  POST-OPERATIVE DIAGNOSIS:  PRIMARY OSTEOARTHRITIS RIGHT HIP  PROCEDURE:  Procedure(s): TOTAL HIP ARTHROPLASTY ANTERIOR APPROACH (Right)  SURGEON: Laurene Footman, MD  ASSISTANTS: None  ANESTHESIA:   spinal  EBL:  Total I/O In: -  Out: 550 [Urine:150; Blood:400]  BLOOD ADMINISTERED:none  DRAINS: none   LOCAL MEDICATIONS USED:  MARCAINE     SPECIMEN:  Source of Specimen:  Right femoral head  DISPOSITION OF SPECIMEN:  PATHOLOGY  COUNTS:  YES  TOURNIQUET:  * No tourniquets in log *  IMPLANTS: Medacta AMIS 1 standard stem with 51 Mpact DM cup with liner and S 28 mm head  DICTATION: .Dragon Dictation   The patient was brought to the operating room and after spinal anesthesia was obtained patient was placed on the operative table with the ipsilateral foot into the Medacta attachment, contralateral leg on a well-padded table. C-arm was brought in and preop template x-ray taken. After prepping and draping in usual sterile fashion appropriate patient identification and timeout procedures were completed. Anterior approach to the hip was obtained and centered over the greater trochanter and TFL muscle. The subcutaneous tissue was incised hemostasis being achieved by electrocautery. TFL fascia was incised and the muscle retracted laterally deep retractor placed. The lateral femoral circumflex vessels were identified and ligated. The anterior capsule was exposed and a capsulotomy performed. The neck was identified and a femoral neck cut carried out with a saw. The head was removed without difficulty and showed sclerotic femoral head and acetabulum. Reaming was carried out to 52 mm and a 52 mm cup trial gave appropriate tightness to the acetabular component a 52 DM cup was impacted into position. The leg was then externally rotated and ischiofemoral and pubofemoral releases  carried out. The femur was sequentially broached to a size 1, size 1 standard width S head trials were placed and the final components chosen. The 1 standard stem was inserted along with a S 28 mm head and 52 mm liner. The hip was reduced and was stable the wound was thoroughly irrigated. The deep fascia was closed using a heavy [place] after infiltration of 30 cc of quarter percent Sensorcaine with epinephrine. TXA injected into the joint. 3-0 v-loc subcutaneously with skin staples Xeroform and honeycomb dressing applied  PLAN OF CARE: Admit to inpatient transferred to PACU in stable condition

## 2016-06-26 NOTE — H&P (Signed)
Reviewed paper H+P, will be scanned into chart. No changes noted.  

## 2016-06-26 NOTE — Transfer of Care (Signed)
Immediate Anesthesia Transfer of Care Note  Patient: Brenda Barber  Procedure(s) Performed: Procedure(s): TOTAL HIP ARTHROPLASTY ANTERIOR APPROACH (Right)  Patient Location: PACU  Anesthesia Type:Spinal  Level of Consciousness: sedated  Airway & Oxygen Therapy: Patient Spontanous Breathing and Patient connected to face mask oxygen  Post-op Assessment: Report given to RN and Post -op Vital signs reviewed and stable  Post vital signs: Reviewed and stable  Last Vitals:  Vitals:   06/26/16 1052  BP: (!) 146/80  Pulse: 66  Resp: 18  Temp: 36.3 C    Last Pain:  Vitals:   06/26/16 1052  TempSrc: Tympanic  PainSc: 0-No pain         Complications: No apparent anesthesia complications

## 2016-06-27 ENCOUNTER — Encounter: Payer: Self-pay | Admitting: Orthopedic Surgery

## 2016-06-27 LAB — BASIC METABOLIC PANEL
ANION GAP: 9 (ref 5–15)
BUN: 22 mg/dL — ABNORMAL HIGH (ref 6–20)
CALCIUM: 7.1 mg/dL — AB (ref 8.9–10.3)
CO2: 25 mmol/L (ref 22–32)
Chloride: 106 mmol/L (ref 101–111)
Creatinine, Ser: 1.17 mg/dL — ABNORMAL HIGH (ref 0.44–1.00)
GFR calc Af Amer: 57 mL/min — ABNORMAL LOW (ref >60)
GFR, EST NON AFRICAN AMERICAN: 50 mL/min — AB (ref >60)
GLUCOSE: 155 mg/dL — AB (ref 65–99)
Potassium: 3.2 mmol/L — ABNORMAL LOW (ref 3.5–5.1)
SODIUM: 140 mmol/L (ref 135–145)

## 2016-06-27 LAB — CBC
HCT: 28 % — ABNORMAL LOW (ref 35.0–47.0)
Hemoglobin: 9.5 g/dL — ABNORMAL LOW (ref 12.0–16.0)
MCH: 28.8 pg (ref 26.0–34.0)
MCHC: 34.1 g/dL (ref 32.0–36.0)
MCV: 84.3 fL (ref 80.0–100.0)
PLATELETS: 188 10*3/uL (ref 150–440)
RBC: 3.32 MIL/uL — ABNORMAL LOW (ref 3.80–5.20)
RDW: 16 % — AB (ref 11.5–14.5)
WBC: 9.7 10*3/uL (ref 3.6–11.0)

## 2016-06-27 MED ORDER — PROMETHAZINE HCL 25 MG PO TABS
12.5000 mg | ORAL_TABLET | Freq: Once | ORAL | Status: AC
  Administered 2016-06-27: 12.5 mg via ORAL
  Filled 2016-06-27: qty 1

## 2016-06-27 MED ORDER — POTASSIUM CHLORIDE 20 MEQ PO PACK
20.0000 meq | PACK | Freq: Three times a day (TID) | ORAL | Status: AC
Start: 2016-06-27 — End: 2016-06-28
  Administered 2016-06-27 (×2): 20 meq via ORAL
  Filled 2016-06-27 (×2): qty 1

## 2016-06-27 NOTE — Clinical Social Work Note (Signed)
Clinical Social Work Assessment  Patient Details  Name: Brenda Barber MRN: [id number] Date of Birth: 03-29-1956  Date of referral:  06/27/16               Reason for consult:  Facility Placement                Permission sought to share information with:  Chartered certified accountant granted to share information::  Yes, Verbal Permission Granted  Name::      [place]::   [place]   Relationship::     Contact Information:     Housing/Transportation Living arrangements for the past 2 months:  [place] of Information:  Patient Patient Interpreter Needed:  None Criminal Activity/Legal Involvement Pertinent to Current Situation/Hospitalization:  No - Comment as needed Significant Relationships:  Friend Lives with:  Roommate Do you feel safe going back to the place where you live?  Yes Need for family participation in patient care:  Yes (Comment)  Care giving concerns:  Patient lives with a roommate Paula in [place].    Social Worker assessment / plan:  Holiday representative ([place]) received SNF consult. PT is recommending SNF. CSW met with patient to discuss D/C plan. Patient was alert and oriented and sitting up in the chair. CSW introduced self and explained role of CSW department. Patient reported that she lives with a roommate in [place]. [place] explained that PT is recommending SNF and that [facility] requires authorization. Patient is agreeable to SNF search in [facility]. FL2 complete and faxed out. CSW presented bed offers and patient chose [facility]. Per Alger Simons liaison he will start [facility] authorization today. CSW will continue to follow and assist as needed.   Employment status:  Retired Nurse, adult PT Recommendations:  [place] / Referral to community resources:  [place]  Patient/Family's Response to care:  Patient is agreeable to going to [facility].    Patient/Family's Understanding of and Emotional Response to Diagnosis, Current Treatment, and Prognosis:  Patient was pleasant and thanked CSW for assistance.   Emotional Assessment Appearance:  Appears stated age Attitude/Demeanor/Rapport:    Affect (typically observed):  Accepting, Adaptable, Pleasant Orientation:  Oriented to Self, Oriented to Place, Oriented to  Time, Oriented to Situation Alcohol / Substance use:  Not Applicable Psych involvement (Current and /or in the community):  No (Comment)  Discharge Needs  Concerns to be addressed:  Discharge Planning Concerns Readmission within the last 30 days:  No Current discharge risk:  Dependent with Mobility Barriers to Discharge:  Continued Medical Work up   [employer], Veronia Beets, LCSW 06/27/2016, 1:03 PM

## 2016-06-27 NOTE — Evaluation (Signed)
Physical Therapy Evaluation Patient Details Name: Brenda Barber MRN: [id number] DOB: Sep 17, 1955 Today's Date: 06/27/2016   History of Present Illness  Pt is a 61 yo female, s/p R THR anterior approach, history of minor CVA  Clinical Impression  Pt awake w/ flat affect, showed minor cognitive deficits but overall displayed good safety awareness and responded well to commands. Stated she felt nauseous, consulted nursing and stated she received meds this morning. Pt displays decreased strength in the R hip movements, (at least 3/5). She required mod assist to move from supine to sitting at EOB and required UE support to maintain sitting posture and complained of dizziness. BP assessed in supine 196/91, sitting 150/99, and in standing 127/71. Pt able to stand using RW and min assist with increased pain in surgical hip. She walked to the chair w/ a shuffle step to gait pattern using RW w/ min assist and cuing. Pt displayed poor control when sitting down into the chair. Overall pt displays strength, and ROM deficits as well as poor activity tolerance that limit safe household mobility. She will benefit from skilled PT to correct above deficits. Recommend she dc to SNF following acute hospitalization.    Follow Up Recommendations SNF    Equipment Recommendations  Rolling walker with 5" wheels    Recommendations for Other Services       Precautions / Restrictions Precautions Precautions: Anterior Hip Precaution Booklet Issued: No Precaution Comments: no precautions Restrictions Weight Bearing Restrictions: Yes RLE Weight Bearing: Weight bearing as tolerated      Mobility  Bed Mobility Overal bed mobility: Needs Assistance Bed Mobility: Supine to Sit     Supine to sit: Mod assist     General bed mobility comments: assist of R LE and cuing for moving legs over the bed, mod assist to help bring trunk upright   Transfers Overall transfer level: Needs assistance Equipment used: Rolling  walker (2 wheeled) Transfers: Sit to/from Stand Sit to Stand: Min assist         General transfer comment: cuing for hand placement, increased pain in R hip, poor descending back to sitting  Ambulation/Gait Ambulation/Gait assistance: Min assist Ambulation Distance (Feet): 3 Feet Assistive device: Rolling walker (2 wheeled) Gait Pattern/deviations: Step-to pattern;Antalgic;Decreased stride length;Shuffle     General Gait Details: slow step to pattern, shuffle gait to chair   Stairs            Wheelchair Mobility    Modified Rankin (Stroke Patients Only)       Balance Overall balance assessment: Needs assistance Sitting-balance support: Single extremity supported Sitting balance-Leahy Scale: Fair Sitting balance - Comments: increased dizziness in sitting  Postural control: Left lateral lean Standing balance support: Bilateral upper extremity supported Standing balance-Leahy Scale: Fair Standing balance comment: forward flexed posture, slightly flexed R LE, RW for additional stability                              Pertinent Vitals/Pain Pain Assessment: Faces Faces Pain Scale: Hurts little more Pain Location: R hip Pain Descriptors / Indicators: Aching Pain Intervention(s): Monitored during session;Limited activity within patient's tolerance;Repositioned    Home Living Family/patient expects to be discharged to:: Private residence Living Arrangements: Non-relatives/Friends (lives w/ roommate and nine cats)   Type of Home: House Home Access: Stairs to enter Entrance Stairs-Rails: Right Entrance Stairs-Number of Steps: 4 Home Layout: One level Home Equipment: Cane - single point  Prior Function Level of Independence: Independent with assistive device(s)               Hand Dominance   Dominant Hand: Right    Extremity/Trunk Assessment   Upper Extremity Assessment Upper Extremity Assessment: Overall WFL for tasks assessed (grossly  at least 4/5)    Lower Extremity Assessment Lower Extremity Assessment: RLE deficits/detail RLE Deficits / Details: grossly at least 3/5 for strength, decreased R hip ROM       Communication   Communication: No difficulties  Cognition Arousal/Alertness: Awake/alert Behavior During Therapy: Flat affect Overall Cognitive Status: No family/caregiver present to determine baseline cognitive functioning                 General Comments: easily distracted, requires frequent cuing    General Comments      Exercises Other Exercises Other Exercises: Supine therex; AAROM; to improve strength for functional tasks; 1x10; ankle pumps, quad sets, heel slides, glute sets, hip abd/add, SLR; min assist for SLR, hip abd, and heel slides   Assessment/Plan    PT Assessment Patient needs continued PT services  PT Problem List Decreased strength;Decreased range of motion;Decreased activity tolerance;Decreased balance;Decreased mobility          PT Treatment Interventions DME instruction;Gait training;Stair training;Functional mobility training;Balance training;Therapeutic exercise;Therapeutic activities;Patient/family education    PT Goals (Current goals can be found in the Care Plan section)  Acute Rehab PT Goals Patient Stated Goal: to get better and return home PT Goal Formulation: With patient Time For Goal Achievement: 07/11/16 Potential to Achieve Goals: Fair    Frequency BID   Barriers to discharge Decreased caregiver support;Inaccessible home environment      Co-evaluation               End of Session Equipment Utilized During Treatment: Gait belt Activity Tolerance: Patient limited by fatigue Patient left: in chair;with call bell/phone within reach;with chair alarm set Nurse Communication: Mobility status         Time: 2336-1224 PT Time Calculation (min) (ACUTE ONLY): 29 min   Charges:         PT G Codes:        Denny Lave 06/27/2016, 9:48  AM

## 2016-06-27 NOTE — Progress Notes (Signed)
OT Cancellation Note  Patient Details Name: Brenda Barber MRN: [id number] DOB: 1956/05/02   Cancelled Treatment:    Reason Eval/Treat Not Completed: Fatigue/lethargy limiting ability to participate. Order received, chart reviewed. Upon entry into pt's room after PT session earlier this afternoon, pt unable to keep eyes open, additional time and verbal cues required to answer simple questions. Pt unable to meaningfully participate in OT evaluation at this time. Will re-attempt OT evaluation at later date/time as appropriate.  Corky Sox, OTR/L 06/27/2016, 2:48 PM

## 2016-06-27 NOTE — Progress Notes (Signed)
   Subjective: 1 Day Post-Op Procedure(s) (LRB): TOTAL HIP ARTHROPLASTY ANTERIOR APPROACH (Right) Patient reports pain as 9 on 0-10 scale. Patient sleeping. Complains of nausea.  Patient is well, but has had some minor complaints of nausea, no vomiting. Denies any CP, SOB, ABD pain. We will start therapy today.  Plan is to go Home after hospital stay.  Objective: Vital signs in last 24 hours: Temp:  [97.2 F (36.2 C)-99 F (37.2 C)] 97.8 F (36.6 C) (02/07 0723) Pulse Rate:  [62-79] 78 (02/07 0726) Resp:  [16-24] 18 (02/07 0726) BP: (127-191)/(67-97) 158/81 (02/07 0726) SpO2:  [93 %-100 %] 94 % (02/07 0726) Weight:  [76 kg (167 lb 8 oz)] 76 kg (167 lb 8 oz) (02/06 1611)  Intake/Output from previous day: 02/06 0701 - 02/07 0700 In: 1531.7 [P.O.:360; I.V.:1071.7; IV Piggyback:100] Out: 1525 [Urine:1125; Blood:400] Intake/Output this shift: No intake/output data recorded.   Recent Labs  06/26/16 1107 06/26/16 1121 06/27/16 0519  HGB 11.2* 10.9* 9.5*    Recent Labs  06/26/16 1107 06/26/16 1121 06/27/16 0519  WBC 7.5  --  9.7  RBC 3.97  --  3.32*  HCT 33.2* 32.0* 28.0*  PLT 215  --  188    Recent Labs  06/26/16 1121 06/27/16 0519  NA 143 140  K 3.5 3.2*  CL  --  106  CO2  --  25  BUN  --  22*  CREATININE  --  1.17*  GLUCOSE 93 155*  CALCIUM  --  7.1*   No results for input(s): LABPT, INR in the last 72 hours.  EXAM General - Patient is Alert, Appropriate and Oriented  Abdomen- soft nontender Extremity - Neurovascular intact Sensation intact distally Intact pulses distally Dorsiflexion/Plantar flexion intact No cellulitis present Compartment soft Dressing - dressing C/D/I and scant drainage Motor Function - intact, moving foot and toes well on exam.   Past Medical History:  Diagnosis Date  . Allergy   . Anemia   . Asthma   . Atrial fibrillation ([place])   . Cerebrovascular accident [facility])    History of visual deficit  . Chiari I malformation  ([place])   . Coronary artery disease   . Depression   . Dyspnea   . GERD (gastroesophageal reflux disease)   . Hyperlipidemia   . Hypertension   . Hypoparathyroidism ([place])   . Hypothyroidism   . Personality disorder, depressive   . PONV (postoperative nausea and vomiting)    difficulty breathing during endoscopy, colonoscopy performed prior w/out problems  . Renal insufficiency   . Sleep apnea   . Thyroid cancer ([place]) 1999   Papillary  . Thyroid disease    hypothyroid     Assessment/Plan:   1 Day Post-Op Procedure(s) (LRB): TOTAL HIP ARTHROPLASTY ANTERIOR APPROACH (Right) Active Problems:   Primary localized osteoarthritis of right hip   Acute post op blood loss anemia    .hypokalemia  Estimated body mass index is 29.67 kg/m as calculated from the following:   Height as of this encounter: '5\' 3"'$  (1.6 m).   Weight as of this encounter: 76 kg (167 lb 8 oz). Advance diet Up with therapy  Needs BM  CM to assist with discharge Klor kon 20 meq TID Recheck labs in the am Phenergan 12.5 nausea x1.   DVT Prophylaxis - Aspirin, TED hose and palvix, SCD Weight-Bearing as tolerated to right leg   T. Rachelle Hora, PA-C [place] 06/27/2016, 8:08 AM

## 2016-06-27 NOTE — Clinical Social Work Placement (Signed)
   CLINICAL SOCIAL WORK PLACEMENT  NOTE  Date:  06/27/2016  Patient Details  Name: Brenda Barber MRN: [id number] Date of Birth: 1956-03-29  Clinical Social Work is seeking post-discharge placement for this patient at the [place] level of care (*CSW will initial, date and re-position this form in  chart as items are completed):  Yes   Patient/family provided with [place] Work Department's list of facilities offering this level of care within the geographic area requested by the patient (or if unable, by the patient's family).  Yes   Patient/family informed of their freedom to choose among providers that offer the needed level of care, that participate in Medicare, Medicaid or managed care program needed by the patient, have an available bed and are willing to accept the patient.  Yes   Patient/family informed of [facility]'s ownership interest in [facility] and [facility], as well as of the fact that they are under no obligation to receive care at these facilities.  PASRR submitted to EDS on 06/27/16     PASRR number received on 06/27/16     Existing PASRR number confirmed on       FL2 transmitted to all facilities in geographic area requested by pt/family on 06/27/16     FL2 transmitted to all facilities within larger geographic area on       Patient informed that his/her managed care company has contracts with or will negotiate with certain facilities, including the following:        Yes   Patient/family informed of bed offers received.  Patient chooses bed at  ([facility] )     Physician recommends and patient chooses bed at      Patient to be transferred to   on  .  Patient to be transferred to facility by       Patient family notified on   of transfer.  Name of family member notified:        PHYSICIAN       Additional Comment:    _______________________________________________ Rajni Holsworth, Veronia Beets, LCSW 06/27/2016, 1:03 PM

## 2016-06-27 NOTE — Progress Notes (Signed)
Physical Therapy Treatment Patient Details Name: Brenda Barber MRN: [id number] DOB: 05-02-1956 Today's Date: 06/27/2016    History of Present Illness Pt is a 61 yo female, s/p R THR anterior approach, history of minor CVA    PT Comments    Pt awake and alert this afternoon and willing to participate in treatment. Stated she was feeling about the same as this morning. Required mod assist to transfer to sitting at EOB. Pt's activity limited to orthostatic hypotension, BP was assessed in supine 154/83, sitting 121/72, standing 0 mins 114/82, pt became light headed and dizzy when performing standing therex and safely sat back down; BP was 94/61 seated. Pt limited to therex seated and able to complete exercises w/ cuing. She is still limited in functional tasks due to decreased strength, ROM, and poor activity tolerance. She will continue to benefit from skilled PT to correct deficits. Recommendation for SNF remains the same.   Follow Up Recommendations  SNF     Equipment Recommendations  Rolling walker with 5" wheels    Recommendations for Other Services       Precautions / Restrictions Precautions Precautions: None Precaution Booklet Issued: No Precaution Comments: ant hip approach w/ no precautions Restrictions Weight Bearing Restrictions: Yes RLE Weight Bearing: Weight bearing as tolerated    Mobility  Bed Mobility Overal bed mobility: Needs Assistance Bed Mobility: Supine to Sit     Supine to sit: Mod assist     General bed mobility comments: assist of R LE and cuing for moving legs over the bed, mod assist to help bring trunk upright   Transfers Overall transfer level: Needs assistance Equipment used: Rolling walker (2 wheeled) Transfers: Sit to/from Stand Sit to Stand: Min assist         General transfer comment: improved descending back to sitting, cuing and UE support to transfer   Ambulation/Gait                 Stairs            Wheelchair  Mobility    Modified Rankin (Stroke Patients Only)       Balance Overall balance assessment: Needs assistance Sitting-balance support: Single extremity supported;Feet supported Sitting balance-Leahy Scale: Fair Sitting balance - Comments: intermittent UE support to maintain seated posture, Cuing to sit upright    Standing balance support: Bilateral upper extremity supported Standing balance-Leahy Scale: Fair Standing balance comment: forward flexed posture, slightly flexed R LE, RW for additional stability, cuing for upright posture                     Cognition Arousal/Alertness: Awake/alert Behavior During Therapy: Flat affect Overall Cognitive Status: Within Functional Limits for tasks assessed                      Exercises Other Exercises Other Exercises: standing therex; 1x10 AROM; to improve strength for functional tasks; marching 1x10, decreased step height on R LE.  Other Exercises: seated therex 1x12, AROM to improve strength for functional tasks, heel raises, hip add/abd, LAQs,     General Comments        Pertinent Vitals/Pain Pain Assessment: 0-10 Pain Score: 6  Pain Location: R hip Pain Descriptors / Indicators: Aching Pain Intervention(s): Monitored during session;Premedicated before session;Repositioned    Home Living                      Prior Function  PT Goals (current goals can now be found in the care plan section) Acute Rehab PT Goals Patient Stated Goal: to get better and return home PT Goal Formulation: With patient Time For Goal Achievement: 07/11/16 Potential to Achieve Goals: Fair Progress towards PT goals: Not progressing toward goals - comment (limited by blood pressure )    Frequency    BID      PT Plan Current plan remains appropriate    Co-evaluation             End of Session Equipment Utilized During Treatment: Gait belt Activity Tolerance: Patient limited by fatigue;Other  (comment) (dizzy/light headed) Patient left: in bed;with call bell/phone within reach;with bed alarm set;with SCD's reapplied     Time: 1334-1400 PT Time Calculation (min) (ACUTE ONLY): 26 min  Charges:                       G Codes:      [employer] Student PT 06/27/2016, 2:12 PM

## 2016-06-27 NOTE — Anesthesia Postprocedure Evaluation (Signed)
Anesthesia Post Note  Patient: [name]  Procedure(s) Performed: Procedure(s) (LRB): TOTAL HIP ARTHROPLASTY ANTERIOR APPROACH (Right)  Patient location during evaluation: Other Anesthesia Type: Spinal Level of consciousness: oriented and awake and alert Pain management: pain level controlled Vital Signs Assessment: post-procedure vital signs reviewed and stable Respiratory status: spontaneous breathing, respiratory function stable and patient connected to nasal cannula oxygen Cardiovascular status: blood pressure returned to baseline and stable Postop Assessment: no headache and no backache Anesthetic complications: no     Last Vitals:  Vitals:   06/27/16 0723 06/27/16 0726  BP: (!) 191/89 (!) 158/81  Pulse: 77 78  Resp:  18  Temp: 36.6 C     Last Pain:  Vitals:   06/27/16 0723  TempSrc: Oral  PainSc:                  Alison Stalling

## 2016-06-27 NOTE — Progress Notes (Signed)
Patient is A&O x4. Up with assist x1 and [employer].  Only tolerated a short time up in chair. Slept most of the day. C/O nausea in am, tolerated clear liquid diet. No c/o pain. Dressing intact with some drainage. PPP. Incont of urine x1 this shift. No BM.

## 2016-06-28 LAB — BASIC METABOLIC PANEL
Anion gap: 8 (ref 5–15)
BUN: 19 mg/dL (ref 6–20)
CHLORIDE: 105 mmol/L (ref 101–111)
CO2: 27 mmol/L (ref 22–32)
Calcium: 7.3 mg/dL — ABNORMAL LOW (ref 8.9–10.3)
Creatinine, Ser: 1.08 mg/dL — ABNORMAL HIGH (ref 0.44–1.00)
GFR calc Af Amer: >60 mL/min (ref >60)
GFR calc non Af Amer: 55 mL/min — ABNORMAL LOW (ref >60)
GLUCOSE: 131 mg/dL — AB (ref 65–99)
POTASSIUM: 3.6 mmol/L (ref 3.5–5.1)
Sodium: 140 mmol/L (ref 135–145)

## 2016-06-28 LAB — CBC
HEMATOCRIT: 27.5 % — AB (ref 35.0–47.0)
HEMOGLOBIN: 9.3 g/dL — AB (ref 12.0–16.0)
MCH: 28.6 pg (ref 26.0–34.0)
MCHC: 33.6 g/dL (ref 32.0–36.0)
MCV: 85 fL (ref 80.0–100.0)
Platelets: 188 10*3/uL (ref 150–440)
RBC: 3.24 MIL/uL — AB (ref 3.80–5.20)
RDW: 16.6 % — ABNORMAL HIGH (ref 11.5–14.5)
WBC: 10.3 10*3/uL (ref 3.6–11.0)

## 2016-06-28 LAB — SURGICAL PATHOLOGY

## 2016-06-28 NOTE — Evaluation (Signed)
Occupational Therapy Evaluation Patient Details Name: Brenda Barber MRN: [id number] DOB: 24-Jan-1956 Today's Date: 06/28/2016    History of Present Illness Pt is a 61 yo female, s/p R THR anterior approach, history of minor CVA   Clinical Impression   Pt seen for OT evaluation this date. Pt indep with ADL, driving prior, however reported feeling very fatigued with community activities like grocery shopping and after bathing. Pt also reports trouble managing medications at home ("I forget sometimes") despite the pill box that she uses, and reports 1 fall in past 12 months ("I'm really unsteady on my feet sometimes." Pt presents with increased pain, decreased strength/ROM/activity tolerance/knowledge of AE for ADL, and increased need for assistance with self care tasks. Pt educated in energy conservation strategies and falls prevention strategies to support return home. Unclear how much assistance she may have at home; 9 cats presents a falls hazard as they often "get underfoot." Pt would benefit from skilled OT services to address noted impairments and functional deficits in order to maximize return to PLOF and minimize risk of falls. Pt is a good candidate for STR prior to return home.       Follow Up Recommendations  SNF    Equipment Recommendations  Tub/shower seat;3 in 1 bedside commode;Other (comment) (vertical grab bar outside of tub shower)    Recommendations for Other Services       Precautions / Restrictions Precautions Precautions: Fall Precaution Booklet Issued: No Precaution Comments: ant hip approach w/ no precautions Restrictions Weight Bearing Restrictions: Yes RLE Weight Bearing: Weight bearing as tolerated      Mobility Bed Mobility Overal bed mobility: Needs Assistance Bed Mobility: Supine to Sit     Supine to sit: Min guard     General bed mobility comments: min guard and verbal cues for hand placement to increase success using bed rails, no physical assist  required but pt demonstrated increased effort to complete  Transfers Overall transfer level: Needs assistance Equipment used: Rolling walker (2 wheeled) Transfers: Sit to/from Stand Sit to Stand: Min guard;Min assist         General transfer comment: min assist upon initial stand with slight posterior lean and verbal cues for hand placement/technique to improve safety    Balance Overall balance assessment: History of Falls;Needs assistance Sitting-balance support: No upper extremity supported;Feet supported Sitting balance-Leahy Scale: Good Sitting balance - Comments: good upright posture, no leaning or swaying Postural control: Posterior lean Standing balance support: Bilateral upper extremity supported;During functional activity Standing balance-Leahy Scale: Fair Standing balance comment: slightly flexed knee throughout, forward flexed trunk that required constant cuing to correct                            ADL Overall ADL's : Needs assistance/impaired Eating/Feeding: Sitting;Set up   Grooming: Sitting;Set up   Upper Body Bathing: Sitting;Min guard   Lower Body Bathing: Minimal assistance;Sitting/lateral leans   Upper Body Dressing : Set up;Supervision/safety;Sitting   Lower Body Dressing: Minimal assistance;Sit to/from stand;Sitting/lateral leans;With adaptive equipment;Cueing for compensatory techniques Lower Body Dressing Details (indicate cue type and reason): Pt performed LB ADL tasks using AE with min assist to stabilize reacher to doff socks and verbal cues for technique             Functional mobility during ADLs: Minimal assistance;Min guard General ADL Comments: Pt generally min assist for LB ADL, min guard-min assist for functional mobility with verbal cues for hand placement/safety  Vision Vision Assessment?: No apparent visual deficits   Perception     Praxis Praxis Praxis tested?: Within functional limits    Pertinent Vitals/Pain  Pain Assessment: 0-10 Pain Score: 5  Pain Location: R hip Pain Descriptors / Indicators: Aching (stiffness) Pain Intervention(s): Monitored during session;Ice applied;Repositioned     Hand Dominance Right   Extremity/Trunk Assessment Upper Extremity Assessment Upper Extremity Assessment: Overall WFL for tasks assessed (grossly 4-/5 bilaterally, ROM WFL)   Lower Extremity Assessment Lower Extremity Assessment: Defer to PT evaluation;RLE deficits/detail       Communication Communication Communication: No difficulties   Cognition Arousal/Alertness: Awake/alert Behavior During Therapy: WFL for tasks assessed/performed Overall Cognitive Status: Within Functional Limits for tasks assessed                 General Comments: slight delay in response time, able to follow all commands, appropriate responses to questions and situational safety scenarios   General Comments       Exercises   Other Exercises Other Exercises: Pt educated in energy conservation strategies and pursed lip breathing as well as home modifications to maximize safety and functional independence while minimizing falls risk given hx of falls (1 fall in past 12 months) Other Exercises: gait training; min assist w/ chair follow, 160', frequent cuing to correct gait pattern and safely use RW   Shoulder Instructions      Home Living Family/patient expects to be discharged to:: Private residence Living Arrangements: Non-relatives/Friends (pt lives with roommate, 9 cats) Available Help at Discharge:  (unclear of level of available help) Type of Home: House Home Access: Stairs to enter Technical brewer of Steps: 4 Entrance Stairs-Rails: Right Home Layout: One level     Bathroom Shower/Tub: Tub/shower unit Shower/tub characteristics: Door Biochemist, clinical: Standard Bathroom Accessibility: No   Home Equipment: Cane - single point;Hand held shower head          Prior Functioning/Environment Level  of Independence: Independent with assistive device(s)        Comments: pt reports indep with ambulation using SPC; indep with ADL, driving, does not work ("disabled"); enjoys reading, cooking; able to drive and get groceries, fill prescriptions but reports very fatigued after competing community activities and bathing tasks        OT Problem List: Decreased strength;Pain;Decreased range of motion;Decreased cognition;Decreased activity tolerance;Decreased knowledge of use of DME or AE   OT Treatment/Interventions: Self-care/ADL training;Therapeutic exercise;Therapeutic activities;Energy conservation;DME and/or AE instruction;Patient/family education    OT Goals(Current goals can be found in the care plan section) Acute Rehab OT Goals Patient Stated Goal: get better OT Goal Formulation: With patient Time For Goal Achievement: 07/12/16 Potential to Achieve Goals: Good  OT Frequency: Min 1X/week   Barriers to D/C: Decreased caregiver support;Inaccessible home environment  4 STE, 9 cats (fall risk), and roommate but unclear how supportive/available roommate is for assistance       Co-evaluation              End of Session Equipment Utilized During Treatment: Gait belt;Rolling walker  Activity Tolerance: Patient tolerated treatment well Patient left: in chair;with call bell/phone within reach;with chair alarm set;with SCD's reapplied   Time: 1027-2536 OT Time Calculation (min): 44 min Charges:  OT General Charges $OT Visit: 1 Procedure OT Evaluation $OT Eval Low Complexity: 1 Procedure OT Treatments $Self Care/Home Management : 23-37 mins G-Codes:    Corky Sox, OTR/L 06/28/2016, 2:06 PM

## 2016-06-28 NOTE — Progress Notes (Signed)
Patient's BP 100/59, HR 60. BP meds to give patient. Notified Dr. Rudene Christians, New orders to hold if SBP less than 110.

## 2016-06-28 NOTE — Progress Notes (Signed)
Per [facility] authorization has been received. Clinical Social Worker (CSW) met with patient and made her aware of above. Plan is for patient to D/C to [facility] tomorrow. CSW left patient's friend Nevin Bloodgood a Advertising account executive. CSW will continue to follow and assist as needed.   [employer], LCSW [phone]

## 2016-06-28 NOTE — Progress Notes (Signed)
   Subjective: 2 Days Post-Op Procedure(s) (LRB): TOTAL HIP ARTHROPLASTY ANTERIOR APPROACH (Right) Patient reports pain as 5 on 0-10 scale. Nausea resolved  Patient is well, and has had no acute complaints or problems Denies any CP, SOB, ABD pain. We will continue therapy today.  Plan is to go Skilled nursing facility after hospital stay.  Objective: Vital signs in last 24 hours: Temp:  [97.9 F (36.6 C)-98.2 F (36.8 C)] 97.9 F (36.6 C) (02/08 0756) Pulse Rate:  [73-86] 77 (02/08 0756) Resp:  [18-20] 20 (02/08 0756) BP: (129-164)/(71-85) 129/75 (02/08 0756) SpO2:  [91 %-96 %] 91 % (02/08 0756)  Intake/Output from previous day: 02/07 0701 - 02/08 0700 In: 720 [P.O.:720] Out: 241 [Urine:241] Intake/Output this shift: No intake/output data recorded.   Recent Labs  06/26/16 1107 06/26/16 1121 06/27/16 0519 06/28/16 0608  HGB 11.2* 10.9* 9.5* 9.3*    Recent Labs  06/27/16 0519 06/28/16 0608  WBC 9.7 10.3  RBC 3.32* 3.24*  HCT 28.0* 27.5*  PLT 188 188    Recent Labs  06/27/16 0519 06/28/16 0608  NA 140 140  K 3.2* 3.6  CL 106 105  CO2 25 27  BUN 22* 19  CREATININE 1.17* 1.08*  GLUCOSE 155* 131*  CALCIUM 7.1* 7.3*   No results for input(s): LABPT, INR in the last 72 hours.  EXAM General - Patient is Alert, Appropriate and Oriented  Abdomen- soft nontender Extremity - Neurovascular intact Sensation intact distally Intact pulses distally Dorsiflexion/Plantar flexion intact No cellulitis present Compartment soft Dressing - dressing C/D/I and scant drainage, dressing changed Motor Function - intact, moving foot and toes well on exam.   Past Medical History:  Diagnosis Date  . Allergy   . Anemia   . Asthma   . Atrial fibrillation ([place])   . Cerebrovascular accident [facility])    History of visual deficit  . Chiari I malformation ([place])   . Coronary artery disease   . Depression   . Dyspnea   . GERD (gastroesophageal reflux disease)   .  Hyperlipidemia   . Hypertension   . Hypoparathyroidism ([place])   . Hypothyroidism   . Personality disorder, depressive   . PONV (postoperative nausea and vomiting)    difficulty breathing during endoscopy, colonoscopy performed prior w/out problems  . Renal insufficiency   . Sleep apnea   . Thyroid cancer ([place]) 1999   Papillary  . Thyroid disease    hypothyroid     Assessment/Plan:   2 Days Post-Op Procedure(s) (LRB): TOTAL HIP ARTHROPLASTY ANTERIOR APPROACH (Right) Active Problems:   Primary localized osteoarthritis of right hip   Acute post op blood loss anemia      Estimated body mass index is 29.67 kg/m as calculated from the following:   Height as of this encounter: '5\' 3"'$  (1.6 m).   Weight as of this encounter: 76 kg (167 lb 8 oz). Advance diet Up with therapy  Needs BM  CM to assist with discharge, SNF tomorrow.   DVT Prophylaxis - Aspirin, TED hose and palvix, SCD Weight-Bearing as tolerated to right leg   T. Rachelle Hora, PA-C [place] 06/28/2016, 9:13 AM

## 2016-06-28 NOTE — Progress Notes (Signed)
Physical Therapy Treatment Patient Details Name: Brenda Barber MRN: [id number] DOB: 10-14-1955 Today's Date: 06/28/2016    History of Present Illness Pt is a 61 yo female, s/p R THR anterior approach, history of minor CVA    PT Comments    Pt more alert today and willing to participate in PT treatment. Stated her pain was 5/10 w/ stiffness in her R thigh. Her blood pressure was stable this morning w/ no complaints of dizziness or nausea. Pt able to ambulate w/ min assist and chair follow using a RW around the nursing station (80 ft x2'). She displayed poor safety awareness and signs of instability during ambulation. She required frequent cuing to improve posture, and gait pattern during ambulation, and would occasionally drift to the Left or stagger forward and require min assist to correct and maintain balance. Pt is progressing in mobility but still displays decreased strength, ROM, activity tolerance and safety awareness that limit safe household mobility. Pt will continue to benefit from skilled Pt and recommendation for SNF upon dc from acute hospitalization remains the same.   Follow Up Recommendations  SNF     Equipment Recommendations  Rolling walker with 5" wheels    Recommendations for Other Services       Precautions / Restrictions Precautions Precautions: Fall Precaution Booklet Issued: No Precaution Comments: ant hip approach w/ no precautions Restrictions Weight Bearing Restrictions: Yes RLE Weight Bearing: Weight bearing as tolerated    Mobility  Bed Mobility               General bed mobility comments: not assessed this morning, pt was in chair upon entrance  Transfers Overall transfer level: Needs assistance Equipment used: Rolling walker (2 wheeled) Transfers: Sit to/from Stand Sit to Stand: Min guard         General transfer comment: cuing for UE hand placement, requires time to adjust to upright position in stance, R knee remains in slighlty flexed  position  Ambulation/Gait Ambulation/Gait assistance: +2 safety/equipment;Min assist Ambulation Distance (Feet): 80 Feet x2 Assistive device: Rolling walker (2 wheeled) Gait Pattern/deviations: Decreased stance time - right;Step-to pattern;Trunk flexed;Drifts right/left;Antalgic     General Gait Details: R knee remains slightly flexed throughout even after cuing, decreased stance time on right improved w/ cuing, occasionally drifts to left w/ ambulation and requires frequent cuing to correct, Pt also staggered forward occasionally w/ increased gait speed w/ RW and required min assist to correct and cuing to use RW    Stairs            Wheelchair Mobility    Modified Rankin (Stroke Patients Only)       Balance Overall balance assessment: Needs assistance Sitting-balance support: No upper extremity supported;Feet supported Sitting balance-Leahy Scale: Good Sitting balance - Comments: good upright posture, no leaning or swaying   Standing balance support: Bilateral upper extremity supported Standing balance-Leahy Scale: Fair Standing balance comment: slightly flexed knee throughout, forward flexed trunk that required constant cuing to correct                    Cognition Arousal/Alertness: Awake/alert Behavior During Therapy: Flat affect Overall Cognitive Status: Within Functional Limits for tasks assessed                      Exercises Other Exercises Other Exercises: seated therex; 1x12, AROM to improve strength for functional tasks; marching, ankle pumps, LAQs, hip add Other Exercises: gait training; min assist w/ chair follow, 80  ft x2, frequent cuing to correct gait pattern and safely use RW    General Comments        Pertinent Vitals/Pain Pain Assessment: 0-10 Pain Score: 5  Pain Location: R hip Pain Descriptors / Indicators: Aching (stiffness) Pain Intervention(s): Monitored during session;Repositioned;Limited activity within patient's  tolerance;Premedicated before session    Home Living                      Prior Function            PT Goals (current goals can now be found in the care plan section) Acute Rehab PT Goals Patient Stated Goal: to get better and return home PT Goal Formulation: With patient Time For Goal Achievement: 07/11/16 Potential to Achieve Goals: Good Progress towards PT goals: Progressing toward goals    Frequency    BID      PT Plan Current plan remains appropriate    Co-evaluation             End of Session Equipment Utilized During Treatment: Gait belt Activity Tolerance: Patient tolerated treatment well Patient left: in chair;with call bell/phone within reach;with chair alarm set;with SCD's reapplied     Time: 1005-1035 PT Time Calculation (min) (ACUTE ONLY): 30 min  Charges:                       G Codes:      [employer] Student PT 06/28/2016, 10:52 AM

## 2016-06-28 NOTE — Progress Notes (Signed)
Physical Therapy Treatment Patient Details Name: Brenda Barber MRN: [id number] DOB: 1956-03-17 Today's Date: 06/28/2016    History of Present Illness Pt is a 61 yo female, s/p R THR anterior approach, history of minor CVA    PT Comments    Pt agreeable to PT; reports R hip/thigh pain 5/10 and requesting pain medication. Pt participates in lower extremity exercises in seated position. BP noted to be 99/61 prior to stand/ambulation attempts. Pt notes dizziness with stand that does not resolve; ambulation limited chair to bed with Min guard. Transfers require Min A due to mild unsteadiness. Pt educated in/performs several supine exercises as well. Continue PT to progress strength, endurance and tolerance of upright position as well as balance to improve functional mobility.   Follow Up Recommendations  SNF     Equipment Recommendations  Rolling walker with 5" wheels    Recommendations for Other Services       Precautions / Restrictions Precautions Precautions: Anterior Hip;Fall Precaution Comments: ant hip approach w/ no precautions Restrictions Weight Bearing Restrictions: Yes RLE Weight Bearing: Weight bearing as tolerated    Mobility  Bed Mobility Overal bed mobility: Needs Assistance Bed Mobility: Sit to Supine     Supine to sit: Min guard Sit to supine: Min assist   General bed mobility comments: Assist for LEs  Transfers Overall transfer level: Needs assistance Equipment used: Rolling walker (2 wheeled) Transfers: Sit to/from Stand Sit to Stand: Min guard;Min assist         General transfer comment: min assist upon initial stand with slight posterior lean and verbal cues for hand placement/technique to improve safety  Ambulation/Gait Ambulation/Gait assistance: Min guard Ambulation Distance (Feet): 3 Feet (chair to bed) Assistive device: Rolling walker (2 wheeled) Gait Pattern/deviations: Step-to pattern Gait velocity: reduced Gait velocity interpretation:  <1.8 ft/sec, indicative of risk for recurrent falls General Gait Details: Pt with dizziness in stand; BP 99/61, that dos not resolve limiting amb this session   Stairs            Wheelchair Mobility    Modified Rankin (Stroke Patients Only)       Balance Overall balance assessment: History of Falls;Needs assistance Sitting-balance support: Feet supported Sitting balance-Leahy Scale: Good   Postural control: Posterior lean Standing balance support: Bilateral upper extremity supported Standing balance-Leahy Scale: Fair                      Cognition Arousal/Alertness: Awake/alert Behavior During Therapy: WFL for tasks assessed/performed Overall Cognitive Status: Within Functional Limits for tasks assessed                 General Comments: slight delay in response time, able to follow all commands, appropriate responses to questions and situational safety scenarios    Exercises Total Joint Exercises Ankle Circles/Pumps: AROM;Both;20 reps;Supine Quad Sets: Strengthening;Both;10 reps;Supine Gluteal Sets: Strengthening;Both;10 reps;Supine Towel Squeeze: Strengthening;Both;20 reps;Seated Hip ABduction/ADduction: AAROM;Right;10 reps;Seated (knee extended; L AROM) Straight Leg Raises: Right;AAROM;10 reps;Seated (AROM L) [place] Quad: AROM;Both;10 reps;Seated (2 sets) Knee Flexion: AAROM;Right;10 reps;Seated (20 reps L AROM) Other Exercises Other Exercises: Pt educated in energy conservation strategies and pursed lip breathing as well as home modifications to maximize safety and functional independence while minimizing falls risk given hx of falls (1 fall in past 12 months)    General Comments        Pertinent Vitals/Pain Pain Assessment: 0-10 Pain Score: 5  Pain Location: R hip/thigh Pain Intervention(s): Patient requesting pain meds-RN notified;Repositioned  Home Living Family/patient expects to be discharged to:: Private residence Living  Arrangements: Non-relatives/Friends (pt lives with roommate, 9 cats) Available Help at Discharge:  (unclear of level of available help) Type of Home: House Home Access: Stairs to enter Entrance Stairs-Rails: Right Home Layout: One level Home Equipment: [place] - single point;Hand held shower head      Prior Function Level of Independence: Independent with assistive device(s)      Comments: pt reports indep with ambulation using SPC; indep with ADL, driving, does not work ("disabled"); enjoys reading, cooking; able to drive and get groceries, fill prescriptions but reports very fatigued after competing community activities and bathing tasks   PT Goals (current goals can now be found in the care plan section) Acute Rehab PT Goals Patient Stated Goal: get better Progress towards PT goals: Progressing toward goals    Frequency    BID      PT Plan Current plan remains appropriate    Co-evaluation             End of Session Equipment Utilized During Treatment: Gait belt Activity Tolerance: Patient limited by fatigue;Other (comment) (dizziness in stand) Patient left: in bed;with call bell/phone within reach;with bed alarm set;with SCD's reapplied     Time: 1430-1507 PT Time Calculation (min) (ACUTE ONLY): 37 min  Charges:  $Gait Training: 8-22 mins $Therapeutic Exercise: 8-22 mins                    G Codes:      Larae Grooms, PTA 06/28/2016, 3:11 PM

## 2016-06-28 NOTE — Progress Notes (Signed)
Patient is A&O x4. Up with assist x1 and [employer]. Up to [facility]. NSL. Tolerating regular diet. Gave tylenol x1 with shift. No BM this shift, Gave prune juice. Up in chair most of the day. Low BP while in chair. See NN. Bed/chair alarm on for safety.

## 2016-06-29 DIAGNOSIS — R498 Other voice and resonance disorders: Secondary | ICD-10-CM | POA: Diagnosis not present

## 2016-06-29 DIAGNOSIS — I10 Essential (primary) hypertension: Secondary | ICD-10-CM | POA: Diagnosis not present

## 2016-06-29 DIAGNOSIS — Z4801 Encounter for change or removal of surgical wound dressing: Secondary | ICD-10-CM | POA: Diagnosis not present

## 2016-06-29 DIAGNOSIS — J45909 Unspecified asthma, uncomplicated: Secondary | ICD-10-CM | POA: Diagnosis not present

## 2016-06-29 DIAGNOSIS — M25559 Pain in unspecified hip: Secondary | ICD-10-CM | POA: Diagnosis not present

## 2016-06-29 DIAGNOSIS — D649 Anemia, unspecified: Secondary | ICD-10-CM | POA: Diagnosis not present

## 2016-06-29 DIAGNOSIS — G464 Cerebellar stroke syndrome: Secondary | ICD-10-CM | POA: Diagnosis not present

## 2016-06-29 DIAGNOSIS — Z96641 Presence of right artificial hip joint: Secondary | ICD-10-CM | POA: Diagnosis not present

## 2016-06-29 DIAGNOSIS — J309 Allergic rhinitis, unspecified: Secondary | ICD-10-CM | POA: Diagnosis not present

## 2016-06-29 DIAGNOSIS — I480 Paroxysmal atrial fibrillation: Secondary | ICD-10-CM | POA: Diagnosis not present

## 2016-06-29 DIAGNOSIS — D509 Iron deficiency anemia, unspecified: Secondary | ICD-10-CM | POA: Diagnosis not present

## 2016-06-29 DIAGNOSIS — R262 Difficulty in walking, not elsewhere classified: Secondary | ICD-10-CM | POA: Diagnosis not present

## 2016-06-29 DIAGNOSIS — E785 Hyperlipidemia, unspecified: Secondary | ICD-10-CM | POA: Diagnosis not present

## 2016-06-29 DIAGNOSIS — E039 Hypothyroidism, unspecified: Secondary | ICD-10-CM | POA: Diagnosis not present

## 2016-06-29 DIAGNOSIS — D500 Iron deficiency anemia secondary to blood loss (chronic): Secondary | ICD-10-CM | POA: Diagnosis not present

## 2016-06-29 DIAGNOSIS — M1611 Unilateral primary osteoarthritis, right hip: Secondary | ICD-10-CM | POA: Diagnosis not present

## 2016-06-29 DIAGNOSIS — Z7401 Bed confinement status: Secondary | ICD-10-CM | POA: Diagnosis not present

## 2016-06-29 DIAGNOSIS — Z5189 Encounter for other specified aftercare: Secondary | ICD-10-CM | POA: Diagnosis not present

## 2016-06-29 DIAGNOSIS — M6281 Muscle weakness (generalized): Secondary | ICD-10-CM | POA: Diagnosis not present

## 2016-06-29 MED ORDER — OXYCODONE HCL 5 MG PO TABS: 5.0000 mg | ORAL_TABLET | ORAL | 0 refills | Status: DC | PRN

## 2016-06-29 NOTE — Clinical Social Work Placement (Signed)
   CLINICAL SOCIAL WORK PLACEMENT  NOTE  Date:  06/29/2016  Patient Details  Name: Brenda Barber MRN: [id number] Date of Birth: [birth year]  Clinical Social Work is seeking post-discharge placement for this patient at the [place] level of care (*CSW will initial, date and re-position this form in  chart as items are completed):  Yes   Patient/family provided with [facility] [place] Work Department's list of facilities offering this level of care within the geographic area requested by the patient (or if unable, by the patient's family).  Yes   Patient/family informed of their freedom to choose among providers that offer the needed level of care, that participate in Medicare, Medicaid or managed care program needed by the patient, have an available bed and are willing to accept the patient.  Yes   Patient/family informed of [facility]'s ownership interest in [facility] and [facility], as well as of the fact that they are under no obligation to receive care at these facilities.  PASRR submitted to EDS on 06/27/16     PASRR number received on 06/27/16     Existing PASRR number confirmed on       FL2 transmitted to all facilities in geographic area requested by pt/family on 06/27/16     FL2 transmitted to all facilities within larger geographic area on       Patient informed that his/her managed care company has contracts with or will negotiate with certain facilities, including the following:        Yes   Patient/family informed of bed offers received.  Patient chooses bed at  ([facility] )     Physician recommends and patient chooses bed at      Patient to be transferred to  ([facility] ) on 06/29/16.  Patient to be transferred to facility by  [facility] )     Patient family notified on 06/29/16 of transfer.  Name of family member notified:   ([place] left patient's friend Nevin Bloodgood a Advertising account executive. )     PHYSICIAN       Additional Comment:     _______________________________________________ Devynn Hessler, Veronia Beets, LCSW 06/29/2016, 9:19 AM

## 2016-06-29 NOTE — Progress Notes (Signed)
Report called to [place] at [facility]. Patient going to room 803. Patient's belongings packed, NT prepared patient for transfer via EMS. IV removed.

## 2016-06-29 NOTE — Progress Notes (Signed)
Patient is medically stable for D/C to [facility] today. Per Dorena Cookey liaison patient will go to room 803. RN will call report to 800 lane RN at [phone] and arrange EMS for transport. [employer] authorization has been received. Clinical Education officer, museum (CSW) sent D/C orders to [facility] via [facility]. Patient is aware of above. CSW left patient's friend Nevin Bloodgood a Advertising account executive. Please reconsult if future social work needs arise. CSW signing off.   [employer], LCSW [phone]

## 2016-06-29 NOTE — Discharge Summary (Signed)
Physician Discharge Summary  Patient ID: Brenda Barber MRN: [id number] DOB/AGE: June 22, 1955 61 y.o.  Admit date: 06/26/2016 Discharge date: 06/29/2016  Admission Diagnoses:  PRIMARY OSTEOARTHRITIS RIGHT HIP   Discharge Diagnoses: Patient Active Problem List   Diagnosis Date Noted  . Primary localized osteoarthritis of right hip 06/26/2016  . Ileus ([place])   . Emesis   . Abdominal pain 01/13/2016  . Arnold-Chiari malformation, type I ([place]) 06/01/2015  . Paroxysmal atrial fibrillation ([place]) 06/01/2015  . Excessive falling 04/21/2015  . Seizure ([place]) 03/16/2015  . Transient alteration of awareness 03/16/2015  . Postprocedural hypoparathyroidism ([place]) 10/31/2014  . H/O malignant neoplasm of thyroid 04/26/2014  . Hypothyroidism, postop 04/26/2014  . Obstructive apnea 04/19/2014  . Lung nodule, multiple 11/09/2013  . Personality disorder, depressive 05/08/2011  . GERD 12/23/2009  . HYPERGLYCEMIA 12/23/2009  . 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 GASTROENTERITIS, VIRAL 08/01/2007  . Hypothyroidism 05/23/2007  . HYPOPARATHYROIDISM 08/12/2006  . HYPERLIPIDEMIA 08/12/2006  . CORNEAL DISORDER 08/12/2006  . Essential hypertension 08/12/2006  . ALLERGIC RHINITIS 08/12/2006  . REACTIVE AIRWAY DISEASE 08/12/2006  . POSTMENOPAUSAL STATUS 08/12/2006  . ROSACEA 08/12/2006  . COUGH, CHRONIC 08/12/2006  . CEREBROVASCULAR ACCIDENT, HX OF 08/12/2006  . MIGRAINES, HX OF 08/12/2006    Past Medical History:  Diagnosis Date  . Allergy   . Anemia   . Asthma   . Atrial fibrillation ([place])   . Cerebrovascular accident [facility])    History of visual deficit  . Chiari I malformation ([place])   . Coronary artery disease   . Depression   . Dyspnea   . GERD (gastroesophageal reflux disease)   . Hyperlipidemia   . Hypertension   . Hypoparathyroidism ([place])   . Hypothyroidism   . Personality disorder, depressive   . PONV (postoperative nausea and vomiting)    difficulty breathing during endoscopy, colonoscopy  performed prior w/out problems  . Renal insufficiency   . Sleep apnea   . Thyroid cancer ([place]) 1999   Papillary  . Thyroid disease    hypothyroid      Transfusion: none   Consultants (if any):   Discharged Condition: Improved  Hospital Course: Brenda Barber is an 61 y.o. female who was admitted 06/26/2016 with a diagnosis of <principal problem not specified> and went to the operating room on 06/26/2016 and underwent the above named procedures.    Surgeries: Procedure(s): TOTAL HIP ARTHROPLASTY ANTERIOR APPROACH on 06/26/2016 Patient tolerated the surgery well. Taken to PACU where she was stabilized and then transferred to the orthopedic floor.  Started on plavix and aspirin. Foot pumps applied bilaterally at 80 mm. Heels elevated on bed with rolled towels. No evidence of DVT. Negative [place]. Physical therapy started on day #1 for gait training and transfer. OT started day #1 for ADL and assisted devices.  Patient's foley was d/c on day #1. Patient's IV  was d/c on day #2.  On post op day #3 patient was stable and ready for discharge to SNF.  Implants: Medacta AMIS 1 standard stem with 61 Mpact DM cup with liner and S 28 mm head  She was given perioperative antibiotics:  Anti-infectives    Start     Dose/Rate Route Frequency Ordered Stop   06/26/16 1600  ceFAZolin (ANCEF) IVPB 1 g/50 mL premix     1 g 100 mL/hr over 30 Minutes Intravenous Every 6 hours 06/26/16 1545 06/27/16 0417   06/25/16 2200  ceFAZolin (ANCEF) IVPB 2g/100 mL premix     2 g 200  mL/hr over 30 Minutes Intravenous  Once 06/25/16 2158 06/26/16 1249    .  She was given sequential compression devices, early ambulation, and plavix and aspirn for DVT prophylaxis.  She benefited maximally from the hospital stay and there were no complications.    Recent vital signs:  Vitals:   06/29/16 0408 06/29/16 0524  BP: 127/63 136/68  Pulse: 82 90  Resp: 19 19  Temp: 98.9 F (37.2 C) (!) 100.8 F (38.2 C)    Recent  laboratory studies:  Lab Results  Component Value Date   HGB 9.3 (L) 06/28/2016   HGB 9.5 (L) 06/27/2016   HGB 10.9 (L) 06/26/2016   Lab Results  Component Value Date   WBC 10.3 06/28/2016   PLT 188 06/28/2016   Lab Results  Component Value Date   INR 0.90 05/30/2016   Lab Results  Component Value Date   NA 140 06/28/2016   K 3.6 06/28/2016   CL 105 06/28/2016   CO2 27 06/28/2016   BUN 19 06/28/2016   CREATININE 1.08 (H) 06/28/2016   GLUCOSE 131 (H) 06/28/2016    Discharge Medications:   Allergies as of 06/29/2016      Reactions   Aspirin-dipyridamole Er    REACTION: Headache   Rosuvastatin    REACTION: Not effective      Medication List    STOP taking these medications   traMADol 50 MG tablet Commonly known as:  ULTRAM     TAKE these medications   acetaminophen 500 MG tablet Commonly known as:  TYLENOL Take 500 mg by mouth every 6 (six) hours as needed for mild pain.   ADVAIR DISKUS 250-50 MCG/DOSE Aepb Generic drug:  Fluticasone-Salmeterol Inhale 1 puff into the lungs daily.   albuterol 108 (90 Base) MCG/ACT inhaler Commonly known as:  PROVENTIL HFA;VENTOLIN HFA Inhale 2 puffs into the lungs every 6 (six) hours as needed for wheezing or shortness of breath.   amiodarone 200 MG tablet Commonly known as:  PACERONE Take 1 tablet (200 mg total) by mouth daily.   aspirin EC 81 MG tablet Take 81 mg by mouth daily.   atenolol 50 MG tablet Commonly known as:  TENORMIN Take 50 mg by mouth daily.   atorvastatin 80 MG tablet Commonly known as:  LIPITOR Take 1 tablet by mouth at bedtime.   azelastine 0.1 % nasal spray Commonly known as:  ASTELIN Place 1 spray into both nostrils daily. Use in each nostril as directed   beclomethasone 80 MCG/ACT inhaler Commonly known as:  QVAR Inhale 1 puff into the lungs 2 (two) times daily.   calcitRIOL 0.25 MCG capsule Commonly known as:  ROCALTROL Take 0.25-0.5 mcg by mouth 2 (two) times daily. Take 2 capsules  every morning and 1 capsule every evening.   clopidogrel 75 MG tablet Commonly known as:  PLAVIX Take 75 mg by mouth daily.   levothyroxine 150 MCG tablet Commonly known as:  SYNTHROID, LEVOTHROID Take 1 tablet by mouth daily before breakfast.   lisinopril 5 MG tablet Commonly known as:  PRINIVIL,ZESTRIL Take 5 mg by mouth daily.   metoprolol 50 MG tablet Commonly known as:  LOPRESSOR Take 50 mg by mouth 2 (two) times daily.   mometasone 50 MCG/ACT nasal spray Commonly known as:  NASONEX Place 2 sprays into the nose daily as needed.   montelukast 10 MG tablet Commonly known as:  SINGULAIR Take 10 mg by mouth at bedtime.   multivitamin tablet Take 1 tablet by mouth daily.  niacin 500 MG tablet Take 500 mg by mouth daily.   nitroGLYCERIN 0.4 MG SL tablet Commonly known as:  NITROSTAT Place 0.4 mg under the tongue every 5 (five) minutes as needed for chest pain.   oxyCODONE 5 MG immediate release tablet Commonly known as:  Oxy IR/ROXICODONE Take 1 tablet (5 mg total) by mouth every 4 (four) hours as needed for breakthrough pain.   traZODone 50 MG tablet Commonly known as:  DESYREL Take 50 mg by mouth at bedtime as needed for sleep.   TUMS E-X 750 750 MG chewable tablet Generic drug:  calcium carbonate Chew 2 tablets by mouth daily.   vitamin C 500 MG tablet Commonly known as:  ASCORBIC ACID Take 500 mg by mouth daily.            Durable Medical Equipment        Start     Ordered   06/26/16 1546  DME Walker rolling  Once    Question:  Patient needs a walker to treat with the following condition  Answer:  Status post total hip replacement, right   06/26/16 1545   06/26/16 1546  DME 3 n 1  Once     06/26/16 1545   06/26/16 1546  DME Bedside commode  Once    Question:  Patient needs a bedside commode to treat with the following condition  Answer:  Status post total hip replacement, right   06/26/16 1545      Diagnostic Studies: Dg Hip Operative  Unilat W Or W/o Pelvis Right  Result Date: 06/26/2016 CLINICAL DATA:  Right hip replacement EXAM: OPERATIVE RIGHT HIP (WITH PELVIS IF PERFORMED) 2 VIEWS TECHNIQUE: Fluoroscopic spot image(s) were submitted for interpretation post-operatively. COMPARISON:  None FLUOROSCOPY TIME:  24 seconds FINDINGS: Right total hip arthroplasty without failure or complication. No fracture or dislocation. IMPRESSION: Interval right total hip arthroplasty. Electronically Signed   By: Kathreen Devoid   On: 06/26/2016 14:25   Dg Hip Unilat W Or W/o Pelvis 2-3 Views Right  Result Date: 06/26/2016 CLINICAL DATA:  Status post right total hip replacement EXAM: DG HIP (WITH OR WITHOUT PELVIS) 2-3V RIGHT COMPARISON:  None. FINDINGS: Interval right total hip arthroplasty without failure complication. No fracture or dislocation. Postsurgical changes in the surrounding soft tissues. IMPRESSION: Interval right total hip arthroplasty. Electronically Signed   By: Kathreen Devoid   On: 06/26/2016 15:09    Disposition: 01-Home or Self Care    Contact information for after-discharge care    Destination    [facility] .   Specialty:  [place] information: [address] [place] [phone]               Signed: Feliberto Gottron 06/29/2016, 8:23 AM

## 2016-06-29 NOTE — Progress Notes (Signed)
Physical Therapy Treatment Patient Details Name: Brenda Barber MRN: [id number] DOB: 1955-11-12 Today's Date: 06/29/2016    History of Present Illness Pt is a 61 yo female, s/p R THR anterior approach, history of minor CVA    PT Comments    Pt awake, alert and willing to participate in PT treatment this morning. Pt displayed improved bed mobility and able to move from supine to sitting at EOB w/ min guarding and cuing, required increased time to move R LE . Pt's BP more stable this morning and able to stand using a RW w/o sings of orthostatic hypotension. Pt ambulated around the nursing station w/ chair follow using RW and min guarding. She ambulated with a hard heel strike w/ the L LE and required constant cuing to take softer steps and increase stance time on the R LE . Pt also required cues and min assist occasionally to correct drifting to her left side. Pt is improving but still displays decreased strength, ROM, and activity tolerance that limit functional mobility. Recommend pt dc to SNF following acute hospitalization.   Follow Up Recommendations  SNF     Equipment Recommendations  Rolling walker with 5" wheels    Recommendations for Other Services       Precautions / Restrictions Precautions Precautions: Fall;Anterior Hip Precaution Booklet Issued: No Precaution Comments: ant hip approach w/ no precautions Restrictions Weight Bearing Restrictions: Yes RLE Weight Bearing: Weight bearing as tolerated    Mobility  Bed Mobility Overal bed mobility: Needs Assistance Bed Mobility: Supine to Sit       Sit to supine: Min guard      Transfers Overall transfer level: Needs assistance Equipment used: Rolling walker (2 wheeled) Transfers: Sit to/from Stand Sit to Stand: Min assist         General transfer comment: cues for hand placement   Ambulation/Gait Ambulation/Gait assistance: Min guard Ambulation Distance (Feet): 160 Feet Assistive device: Rolling walker (2  wheeled) Gait Pattern/deviations: Step-through pattern;Decreased stance time - right;Drifts right/left;Antalgic Gait velocity: improving   General Gait Details: pt ambulated around nursing station w/ chair follow, no symptoms of othostatics, hard heel strike w/ L foot due to pain on R during ambulation, frequent cuing for softer heel strike on L, able to correct but needs reinforcement, Right knee remains slighlty flexed throughout ambulation   Stairs            Wheelchair Mobility    Modified Rankin (Stroke Patients Only)       Balance Overall balance assessment: History of Falls;Needs assistance Sitting-balance support: Feet supported Sitting balance-Leahy Scale: Good Sitting balance - Comments: good upright posture, no leaning or swaying   Standing balance support: Bilateral upper extremity supported Standing balance-Leahy Scale: Fair Standing balance comment: slightly flexed R knee in stance, trunk forwardly flexed, frequent cuing to correct standing posture, RW for additional stability                    Cognition Arousal/Alertness: Awake/alert Behavior During Therapy: WFL for tasks assessed/performed Overall Cognitive Status: Within Functional Limits for tasks assessed                 General Comments: slight delay in response time, able to follow all commands, appropriate responses to questions and situational safety scenarios    Exercises Other Exercises Other Exercises: Seated therex to improve strength for functional tasks; AROM 1x15 heel raises, hip add, marches, cuing for proper technique Other Exercises: Standing weight shifts to promote increase  stance time on R leg and improved R knee extension in stance; 1x10, required cuing and demonstration for proper technique; required min guarding    General Comments        Pertinent Vitals/Pain Pain Assessment: No/denies pain    Home Living                      Prior Function             PT Goals (current goals can now be found in the care plan section) Acute Rehab PT Goals Patient Stated Goal: go to rehab and get better PT Goal Formulation: With patient Time For Goal Achievement: 07/11/16 Potential to Achieve Goals: Good Progress towards PT goals: Progressing toward goals    Frequency    BID      PT Plan Current plan remains appropriate    Co-evaluation             End of Session Equipment Utilized During Treatment: Gait belt Activity Tolerance: Patient tolerated treatment well Patient left: in chair;with call bell/phone within reach;with chair alarm set     Time: 9833-8250 PT Time Calculation (min) (ACUTE ONLY): 24 min  Charges:                       G Codes:      [employer] Student PT 06/29/2016, 9:31 AM

## 2016-06-29 NOTE — Progress Notes (Signed)
Pt would not swallow nightime pills still on the lethargic side during the night. Alert and Oriented this morning up to bedside commode with 1 person assist. Suppository given this morning for bowel movement.

## 2016-06-29 NOTE — Care Management Important Message (Signed)
Important Message  Patient Details  Name: ARVETTA ARAQUE MRN: [id number] Date of Birth: 03/09/1956   Medicare Important Message Given:  Yes Initial signed IM given   Jolly Mango, RN 06/29/2016, 9:24 AM

## 2016-06-29 NOTE — Progress Notes (Signed)
   Subjective: 3 Days Post-Op Procedure(s) (LRB): TOTAL HIP ARTHROPLASTY ANTERIOR APPROACH (Right) Patient reports pain as 5 on 0-10 scale.  Patient is well, and has had no acute complaints or problems Denies any CP, SOB, ABD pain. No UA sxs We will continue therapy today.  Plan is to go Skilled nursing facility after hospital stay.  Objective: Vital signs in last 24 hours: Temp:  [98.9 F (37.2 C)-100.8 F (38.2 C)] 100.8 F (38.2 C) (02/09 0524) Pulse Rate:  [82-95] 90 (02/09 0524) Resp:  [18-19] 19 (02/09 0524) BP: (99-136)/(52-68) 136/68 (02/09 0524) SpO2:  [91 %-95 %] 94 % (02/09 0524)  Intake/Output from previous day: 02/08 0701 - 02/09 0700 In: -  Out: 200 [Urine:200] Intake/Output this shift: No intake/output data recorded.   Recent Labs  06/26/16 1107 06/26/16 1121 06/27/16 0519 06/28/16 0608  HGB 11.2* 10.9* 9.5* 9.3*    Recent Labs  06/27/16 0519 06/28/16 0608  WBC 9.7 10.3  RBC 3.32* 3.24*  HCT 28.0* 27.5*  PLT 188 188    Recent Labs  06/27/16 0519 06/28/16 0608  NA 140 140  K 3.2* 3.6  CL 106 105  CO2 25 27  BUN 22* 19  CREATININE 1.17* 1.08*  GLUCOSE 155* 131*  CALCIUM 7.1* 7.3*   No results for input(s): LABPT, INR in the last 72 hours.  EXAM General - Patient is Alert, Appropriate and Oriented  Abdomen- soft nontender Extremity - Neurovascular intact Sensation intact distally Intact pulses distally Dorsiflexion/Plantar flexion intact No cellulitis present Compartment soft Dressing - dressing C/D/I and scant drainage, Motor Function - intact, moving foot and toes well on exam.   Past Medical History:  Diagnosis Date  . Allergy   . Anemia   . Asthma   . Atrial fibrillation ([place])   . Cerebrovascular accident [facility])    History of visual deficit  . Chiari I malformation ([place])   . Coronary artery disease   . Depression   . Dyspnea   . GERD (gastroesophageal reflux disease)   . Hyperlipidemia   . Hypertension   .  Hypoparathyroidism ([place])   . Hypothyroidism   . Personality disorder, depressive   . PONV (postoperative nausea and vomiting)    difficulty breathing during endoscopy, colonoscopy performed prior w/out problems  . Renal insufficiency   . Sleep apnea   . Thyroid cancer ([place]) 1999   Papillary  . Thyroid disease    hypothyroid     Assessment/Plan:   3 Days Post-Op Procedure(s) (LRB): TOTAL HIP ARTHROPLASTY ANTERIOR APPROACH (Right) Active Problems:   Primary localized osteoarthritis of right hip   Acute post op blood loss anemia      Estimated body mass index is 29.67 kg/m as calculated from the following:   Height as of this encounter: '5\' 3"'$  (1.6 m).   Weight as of this encounter: 76 kg (167 lb 8 oz). Advance diet Up with therapy  Discharge to SNF today Follow up with [facility] in 2 weeks Encourage incentive spirometer   DVT Prophylaxis - Aspirin, TED hose and palvix, SCD Weight-Bearing as tolerated to right leg   T. Rachelle Hora, PA-C [place] 06/29/2016, 8:18 AM

## 2016-06-29 NOTE — Discharge Instructions (Signed)
°ANTERIOR APPROACH TOTAL HIP REPLACEMENT POSTOPERATIVE DIRECTIONS ° ° °Hip Rehabilitation, Guidelines Following Surgery  °The results of a hip operation are greatly improved after range of motion and muscle strengthening exercises. Follow all safety measures which are given to protect your hip. If any of these exercises cause increased pain or swelling in your joint, decrease the amount until you are comfortable again. Then slowly increase the exercises. Call your caregiver if you have problems or questions.  ° °HOME CARE INSTRUCTIONS  °Remove items at home which could result in a fall. This includes throw rugs or furniture in walking pathways.  °· ICE to the affected hip every three hours for 30 minutes at a time and then as needed for pain and swelling.  Continue to use ice on the hip for pain and swelling from surgery. You may notice swelling that will progress down to the foot and ankle.  This is normal after surgery.  Elevate the leg when you are not up walking on it.   °· Continue to use the breathing machine which will help keep your temperature down.  It is common for your temperature to cycle up and down following surgery, especially at night when you are not up moving around and exerting yourself.  The breathing machine keeps your lungs expanded and your temperature down. °· Do not place pillow under knee, focus on keeping the knee straight while resting ° °DIET °You may resume your previous home diet once your are discharged from the hospital. ° °DRESSING / WOUND CARE / SHOWERING °Keep dressing clean and dry. Change dressing only as needed. You may shower after your first follow up appointment with [facility]. ° °ACTIVITY °Walk with your walker as instructed. °Use walker as [place] as suggested by your caregivers. °Avoid periods of inactivity such as sitting longer than an hour when not asleep. This helps prevent blood clots.  °You may resume a sexual relationship in one month or when given the OK  by your doctor.  °You may return to work once you are cleared by your doctor.  °Do not drive a car for 6 weeks or until released by you surgeon.  °Do not drive while taking narcotics. ° °WEIGHT BEARING °Weight bearing as tolerated. Use walker/cane as needed for at least 4 weeks post op. ° °POSTOPERATIVE CONSTIPATION PROTOCOL °Constipation - defined medically as fewer than three stools per week and severe constipation as less than one stool per week. ° °One of the most common issues patients have following surgery is constipation.  Even if you have a regular bowel pattern at home, your normal regimen is likely to be disrupted due to multiple reasons following surgery.  Combination of anesthesia, postoperative narcotics, change in appetite and fluid intake all can affect your bowels.  In order to avoid complications following surgery, here are some recommendations in order to help you during your recovery period. ° °Colace (docusate) - Pick up an over-the-counter form of Colace or another stool softener and take twice a day as [place] as you are requiring postoperative pain medications.  Take with a full glass of water daily.  If you experience loose stools or diarrhea, hold the colace until you stool forms back up.  If your symptoms do not get better within 1 week or if they get worse, check with your doctor. ° °Dulcolax (bisacodyl) - Pick up over-the-counter and take as directed by the product packaging as needed to assist with the movement of your bowels.  Take with a   full glass of water.  Use this product as needed if not relieved by Colace only.   MiraLax (polyethylene glycol) - Pick up over-the-counter to have on hand.  MiraLax is a solution that will increase the amount of water in your bowels to assist with bowel movements.  Take as directed and can mix with a glass of water, juice, soda, coffee, or tea.  Take if you go more than two days without a movement. Do not use MiraLax more than once per day. Call your  doctor if you are still constipated or irregular after using this medication for 7 days in a row.  If you continue to have problems with postoperative constipation, please contact the office for further assistance and recommendations.  If you experience "the worst abdominal pain ever" or develop nausea or vomiting, please contact the office immediatly for further recommendations for treatment.  ITCHING  If you experience itching with your medications, try taking only a single pain pill, or even half a pain pill at a time.  You can also use Benadryl over the counter for itching or also to help with sleep.   TED HOSE STOCKINGS Wear the elastic stockings on both legs for six weeks following surgery during the day but you may remove then at night for sleeping.  MEDICATIONS See your medication summary on the After Visit Summary that the nursing staff will review with you prior to discharge.  You may have some home medications which will be placed on hold until you complete the course of blood thinner medication.  It is important for you to complete the blood thinner medication as prescribed by your surgeon.  Continue your approved medications as instructed at time of discharge.  PRECAUTIONS If you experience chest pain or shortness of breath - call 911 immediately for transfer to the hospital emergency department.  If you develop a fever greater that 101 F, purulent drainage from wound, increased redness or drainage from wound, foul odor from the wound/dressing, or calf pain - CONTACT YOUR SURGEON.                                                   FOLLOW-UP APPOINTMENTS Make sure you keep all of your appointments after your operation with your surgeon and caregivers. You should call the office at the above phone number and make an appointment for approximately two weeks after the date of your surgery or on the date instructed by your surgeon outlined in the "After Visit Summary".  RANGE OF MOTION  AND STRENGTHENING EXERCISES  These exercises are designed to help you keep full movement of your hip joint. Follow your caregiver's or physical therapist's instructions. Perform all exercises about fifteen times, three times per day or as directed. Exercise both hips, even if you have had only one joint replacement. These exercises can be done on a training (exercise) mat, on the floor, on a table or on a bed. Use whatever works the best and is most comfortable for you. Use music or television while you are exercising so that the exercises are a pleasant break in your day. This will make your life better with the exercises acting as a break in routine you can look forward to.  Lying on your back, slowly slide your foot toward your buttocks, raising your knee up off the floor. Then  slowly slide your foot back down until your leg is straight again.  °Lying on your back spread your legs as far apart as you can without causing discomfort.  °Lying on your side, raise your upper leg and foot straight up from the floor as far as is comfortable. Slowly lower the leg and repeat.  °Lying on your back, tighten up the muscle in the front of your thigh (quadriceps muscles). You can do this by keeping your leg straight and trying to raise your heel off the floor. This helps strengthen the largest muscle supporting your knee.  °Lying on your back, tighten up the muscles of your buttocks both with the legs straight and with the knee bent at a comfortable angle while keeping your heel on the floor.  ° °IF YOU ARE TRANSFERRED TO A SKILLED REHAB FACILITY °If the patient is transferred to a skilled rehab facility following release from the hospital, a list of the current medications will be sent to the facility for the patient to continue.  When discharged from the skilled rehab facility, please have the facility set up the patient's Home Health Physical Therapy prior to being released. Also, the skilled facility will be responsible  for providing the patient with their medications at time of release from the facility to include their pain medication, the muscle relaxants, and their blood thinner medication. If the patient is still at the rehab facility at time of the two week follow up appointment, the skilled rehab facility will also need to assist the patient in arranging follow up appointment in our office and any transportation needs. ° °MAKE SURE YOU:  °Understand these instructions.  °Get help right away if you are not doing well or get worse.  ° ° °Pick up stool softner and laxative for home use following surgery while on pain medications. °Continue to use ice for pain and swelling after surgery. °Do not use any lotions or creams on the incision until instructed by your surgeon. ° °

## 2016-07-03 DIAGNOSIS — E039 Hypothyroidism, unspecified: Secondary | ICD-10-CM | POA: Diagnosis not present

## 2016-07-03 DIAGNOSIS — J309 Allergic rhinitis, unspecified: Secondary | ICD-10-CM | POA: Diagnosis not present

## 2016-07-03 DIAGNOSIS — J45909 Unspecified asthma, uncomplicated: Secondary | ICD-10-CM | POA: Diagnosis not present

## 2016-07-03 DIAGNOSIS — Z96641 Presence of right artificial hip joint: Secondary | ICD-10-CM | POA: Diagnosis not present

## 2016-07-03 DIAGNOSIS — I480 Paroxysmal atrial fibrillation: Secondary | ICD-10-CM | POA: Diagnosis not present

## 2016-07-03 DIAGNOSIS — I10 Essential (primary) hypertension: Secondary | ICD-10-CM | POA: Diagnosis not present

## 2016-07-03 DIAGNOSIS — E785 Hyperlipidemia, unspecified: Secondary | ICD-10-CM | POA: Diagnosis not present

## 2016-07-10 DIAGNOSIS — I480 Paroxysmal atrial fibrillation: Secondary | ICD-10-CM | POA: Diagnosis not present

## 2016-07-10 DIAGNOSIS — J45909 Unspecified asthma, uncomplicated: Secondary | ICD-10-CM | POA: Diagnosis not present

## 2016-07-10 DIAGNOSIS — J309 Allergic rhinitis, unspecified: Secondary | ICD-10-CM | POA: Diagnosis not present

## 2016-07-10 DIAGNOSIS — D649 Anemia, unspecified: Secondary | ICD-10-CM | POA: Diagnosis not present

## 2016-07-10 DIAGNOSIS — E785 Hyperlipidemia, unspecified: Secondary | ICD-10-CM | POA: Diagnosis not present

## 2016-07-10 DIAGNOSIS — I10 Essential (primary) hypertension: Secondary | ICD-10-CM | POA: Diagnosis not present

## 2016-07-10 DIAGNOSIS — Z96641 Presence of right artificial hip joint: Secondary | ICD-10-CM | POA: Diagnosis not present

## 2016-07-10 DIAGNOSIS — E039 Hypothyroidism, unspecified: Secondary | ICD-10-CM | POA: Diagnosis not present

## 2016-07-12 ENCOUNTER — Other Ambulatory Visit: Payer: Self-pay | Admitting: *Deleted

## 2016-07-12 NOTE — Patient Outreach (Signed)
[facility]) Care Management  07/12/2016  ANNALIZA ZIA 09-Feb-1956 [id number]   Met with patient at bedside, patient reports that she lives with a roommate. She states she has not been released to drive, she has f/u appointment and will have a friend take her to appointment. Patient states she has some heart issues and asthma. Patient denies any self care issues, denies any needs related to medications.   Spoke with Dimple Nanas, SW at facility, he states patient has planned discharge set for 07/19/16. Home with [facility]. Reminded SW that patient is eligible for [facility] care management if discharge needs or plans change.   No [facility] care management needs assessed this visit. RNCM gave brochure and magnet to patient for patient to contact for future needs.   Plan to sign off, can be re-consulted if needs change.   Royetta Crochet. Laymond Purser, RN, BSN, [place] [facility] Care Coordinator [phone]

## 2016-07-19 ENCOUNTER — Other Ambulatory Visit: Payer: Self-pay | Admitting: *Deleted

## 2016-07-19 DIAGNOSIS — J4520 Mild intermittent asthma, uncomplicated: Secondary | ICD-10-CM

## 2016-07-19 NOTE — Patient Outreach (Signed)
[facility]) Care Management  07/19/2016  Brenda Barber [birth year] [id number]   Met with patient, patient reports she is going home today.  RNCM discussed [facility] care management and also EMMI calls. Patient agrees to [facility] calls.   Plan to order [place]. Laymond Purser, RN, BSN, [place] [phone]) Business Cell  [phone]) Toll Free Office

## 2016-07-23 DIAGNOSIS — G464 Cerebellar stroke syndrome: Secondary | ICD-10-CM | POA: Diagnosis not present

## 2016-07-23 DIAGNOSIS — G4733 Obstructive sleep apnea (adult) (pediatric): Secondary | ICD-10-CM | POA: Diagnosis not present

## 2016-07-23 DIAGNOSIS — M6281 Muscle weakness (generalized): Secondary | ICD-10-CM | POA: Diagnosis not present

## 2016-07-25 DIAGNOSIS — J45909 Unspecified asthma, uncomplicated: Secondary | ICD-10-CM | POA: Diagnosis not present

## 2016-07-25 DIAGNOSIS — I2510 Atherosclerotic heart disease of native coronary artery without angina pectoris: Secondary | ICD-10-CM | POA: Diagnosis not present

## 2016-07-25 DIAGNOSIS — I480 Paroxysmal atrial fibrillation: Secondary | ICD-10-CM | POA: Diagnosis not present

## 2016-07-25 DIAGNOSIS — D509 Iron deficiency anemia, unspecified: Secondary | ICD-10-CM | POA: Diagnosis not present

## 2016-07-25 DIAGNOSIS — G43909 Migraine, unspecified, not intractable, without status migrainosus: Secondary | ICD-10-CM | POA: Diagnosis not present

## 2016-07-25 DIAGNOSIS — Z471 Aftercare following joint replacement surgery: Secondary | ICD-10-CM | POA: Diagnosis not present

## 2016-07-25 DIAGNOSIS — M1991 Primary osteoarthritis, unspecified site: Secondary | ICD-10-CM | POA: Diagnosis not present

## 2016-07-25 DIAGNOSIS — I10 Essential (primary) hypertension: Secondary | ICD-10-CM | POA: Diagnosis not present

## 2016-07-25 DIAGNOSIS — L89312 Pressure ulcer of right buttock, stage 2: Secondary | ICD-10-CM | POA: Diagnosis not present

## 2016-07-26 DIAGNOSIS — M1991 Primary osteoarthritis, unspecified site: Secondary | ICD-10-CM | POA: Diagnosis not present

## 2016-07-26 DIAGNOSIS — I480 Paroxysmal atrial fibrillation: Secondary | ICD-10-CM | POA: Diagnosis not present

## 2016-07-26 DIAGNOSIS — I10 Essential (primary) hypertension: Secondary | ICD-10-CM | POA: Diagnosis not present

## 2016-07-26 DIAGNOSIS — J45909 Unspecified asthma, uncomplicated: Secondary | ICD-10-CM | POA: Diagnosis not present

## 2016-07-26 DIAGNOSIS — I2510 Atherosclerotic heart disease of native coronary artery without angina pectoris: Secondary | ICD-10-CM | POA: Diagnosis not present

## 2016-07-26 DIAGNOSIS — L89312 Pressure ulcer of right buttock, stage 2: Secondary | ICD-10-CM | POA: Diagnosis not present

## 2016-07-26 DIAGNOSIS — Z471 Aftercare following joint replacement surgery: Secondary | ICD-10-CM | POA: Diagnosis not present

## 2016-07-26 DIAGNOSIS — D509 Iron deficiency anemia, unspecified: Secondary | ICD-10-CM | POA: Diagnosis not present

## 2016-07-26 DIAGNOSIS — G43909 Migraine, unspecified, not intractable, without status migrainosus: Secondary | ICD-10-CM | POA: Diagnosis not present

## 2016-07-27 DIAGNOSIS — D509 Iron deficiency anemia, unspecified: Secondary | ICD-10-CM | POA: Diagnosis not present

## 2016-07-27 DIAGNOSIS — I10 Essential (primary) hypertension: Secondary | ICD-10-CM | POA: Diagnosis not present

## 2016-07-27 DIAGNOSIS — M1991 Primary osteoarthritis, unspecified site: Secondary | ICD-10-CM | POA: Diagnosis not present

## 2016-07-27 DIAGNOSIS — L89312 Pressure ulcer of right buttock, stage 2: Secondary | ICD-10-CM | POA: Diagnosis not present

## 2016-07-27 DIAGNOSIS — J45909 Unspecified asthma, uncomplicated: Secondary | ICD-10-CM | POA: Diagnosis not present

## 2016-07-27 DIAGNOSIS — Z471 Aftercare following joint replacement surgery: Secondary | ICD-10-CM | POA: Diagnosis not present

## 2016-07-27 DIAGNOSIS — G43909 Migraine, unspecified, not intractable, without status migrainosus: Secondary | ICD-10-CM | POA: Diagnosis not present

## 2016-07-27 DIAGNOSIS — I480 Paroxysmal atrial fibrillation: Secondary | ICD-10-CM | POA: Diagnosis not present

## 2016-07-27 DIAGNOSIS — I2510 Atherosclerotic heart disease of native coronary artery without angina pectoris: Secondary | ICD-10-CM | POA: Diagnosis not present

## 2016-07-31 DIAGNOSIS — I10 Essential (primary) hypertension: Secondary | ICD-10-CM | POA: Diagnosis not present

## 2016-07-31 DIAGNOSIS — J45909 Unspecified asthma, uncomplicated: Secondary | ICD-10-CM | POA: Diagnosis not present

## 2016-07-31 DIAGNOSIS — I2510 Atherosclerotic heart disease of native coronary artery without angina pectoris: Secondary | ICD-10-CM | POA: Diagnosis not present

## 2016-07-31 DIAGNOSIS — D509 Iron deficiency anemia, unspecified: Secondary | ICD-10-CM | POA: Diagnosis not present

## 2016-07-31 DIAGNOSIS — G43909 Migraine, unspecified, not intractable, without status migrainosus: Secondary | ICD-10-CM | POA: Diagnosis not present

## 2016-07-31 DIAGNOSIS — I480 Paroxysmal atrial fibrillation: Secondary | ICD-10-CM | POA: Diagnosis not present

## 2016-07-31 DIAGNOSIS — Z471 Aftercare following joint replacement surgery: Secondary | ICD-10-CM | POA: Diagnosis not present

## 2016-07-31 DIAGNOSIS — L89312 Pressure ulcer of right buttock, stage 2: Secondary | ICD-10-CM | POA: Diagnosis not present

## 2016-07-31 DIAGNOSIS — M1991 Primary osteoarthritis, unspecified site: Secondary | ICD-10-CM | POA: Diagnosis not present

## 2016-08-01 DIAGNOSIS — J45909 Unspecified asthma, uncomplicated: Secondary | ICD-10-CM | POA: Diagnosis not present

## 2016-08-01 DIAGNOSIS — L89312 Pressure ulcer of right buttock, stage 2: Secondary | ICD-10-CM | POA: Diagnosis not present

## 2016-08-01 DIAGNOSIS — D509 Iron deficiency anemia, unspecified: Secondary | ICD-10-CM | POA: Diagnosis not present

## 2016-08-01 DIAGNOSIS — G43909 Migraine, unspecified, not intractable, without status migrainosus: Secondary | ICD-10-CM | POA: Diagnosis not present

## 2016-08-01 DIAGNOSIS — I10 Essential (primary) hypertension: Secondary | ICD-10-CM | POA: Diagnosis not present

## 2016-08-01 DIAGNOSIS — M1991 Primary osteoarthritis, unspecified site: Secondary | ICD-10-CM | POA: Diagnosis not present

## 2016-08-01 DIAGNOSIS — Z471 Aftercare following joint replacement surgery: Secondary | ICD-10-CM | POA: Diagnosis not present

## 2016-08-01 DIAGNOSIS — I480 Paroxysmal atrial fibrillation: Secondary | ICD-10-CM | POA: Diagnosis not present

## 2016-08-01 DIAGNOSIS — I2510 Atherosclerotic heart disease of native coronary artery without angina pectoris: Secondary | ICD-10-CM | POA: Diagnosis not present

## 2016-08-03 DIAGNOSIS — Z471 Aftercare following joint replacement surgery: Secondary | ICD-10-CM | POA: Diagnosis not present

## 2016-08-03 DIAGNOSIS — I2510 Atherosclerotic heart disease of native coronary artery without angina pectoris: Secondary | ICD-10-CM | POA: Diagnosis not present

## 2016-08-03 DIAGNOSIS — I480 Paroxysmal atrial fibrillation: Secondary | ICD-10-CM | POA: Diagnosis not present

## 2016-08-03 DIAGNOSIS — M1991 Primary osteoarthritis, unspecified site: Secondary | ICD-10-CM | POA: Diagnosis not present

## 2016-08-03 DIAGNOSIS — D509 Iron deficiency anemia, unspecified: Secondary | ICD-10-CM | POA: Diagnosis not present

## 2016-08-03 DIAGNOSIS — I10 Essential (primary) hypertension: Secondary | ICD-10-CM | POA: Diagnosis not present

## 2016-08-03 DIAGNOSIS — L89312 Pressure ulcer of right buttock, stage 2: Secondary | ICD-10-CM | POA: Diagnosis not present

## 2016-08-03 DIAGNOSIS — G43909 Migraine, unspecified, not intractable, without status migrainosus: Secondary | ICD-10-CM | POA: Diagnosis not present

## 2016-08-03 DIAGNOSIS — J45909 Unspecified asthma, uncomplicated: Secondary | ICD-10-CM | POA: Diagnosis not present

## 2016-08-06 DIAGNOSIS — D509 Iron deficiency anemia, unspecified: Secondary | ICD-10-CM | POA: Diagnosis not present

## 2016-08-06 DIAGNOSIS — J45909 Unspecified asthma, uncomplicated: Secondary | ICD-10-CM | POA: Diagnosis not present

## 2016-08-06 DIAGNOSIS — I480 Paroxysmal atrial fibrillation: Secondary | ICD-10-CM | POA: Diagnosis not present

## 2016-08-06 DIAGNOSIS — I2510 Atherosclerotic heart disease of native coronary artery without angina pectoris: Secondary | ICD-10-CM | POA: Diagnosis not present

## 2016-08-06 DIAGNOSIS — L89312 Pressure ulcer of right buttock, stage 2: Secondary | ICD-10-CM | POA: Diagnosis not present

## 2016-08-06 DIAGNOSIS — G43909 Migraine, unspecified, not intractable, without status migrainosus: Secondary | ICD-10-CM | POA: Diagnosis not present

## 2016-08-06 DIAGNOSIS — I10 Essential (primary) hypertension: Secondary | ICD-10-CM | POA: Diagnosis not present

## 2016-08-06 DIAGNOSIS — M1991 Primary osteoarthritis, unspecified site: Secondary | ICD-10-CM | POA: Diagnosis not present

## 2016-08-06 DIAGNOSIS — Z471 Aftercare following joint replacement surgery: Secondary | ICD-10-CM | POA: Diagnosis not present

## 2016-08-08 DIAGNOSIS — Z471 Aftercare following joint replacement surgery: Secondary | ICD-10-CM | POA: Diagnosis not present

## 2016-08-08 DIAGNOSIS — I2510 Atherosclerotic heart disease of native coronary artery without angina pectoris: Secondary | ICD-10-CM | POA: Diagnosis not present

## 2016-08-08 DIAGNOSIS — L89312 Pressure ulcer of right buttock, stage 2: Secondary | ICD-10-CM | POA: Diagnosis not present

## 2016-08-08 DIAGNOSIS — Z96641 Presence of right artificial hip joint: Secondary | ICD-10-CM | POA: Diagnosis not present

## 2016-08-08 DIAGNOSIS — G43909 Migraine, unspecified, not intractable, without status migrainosus: Secondary | ICD-10-CM | POA: Diagnosis not present

## 2016-08-08 DIAGNOSIS — I480 Paroxysmal atrial fibrillation: Secondary | ICD-10-CM | POA: Diagnosis not present

## 2016-08-08 DIAGNOSIS — M1991 Primary osteoarthritis, unspecified site: Secondary | ICD-10-CM | POA: Diagnosis not present

## 2016-08-08 DIAGNOSIS — D509 Iron deficiency anemia, unspecified: Secondary | ICD-10-CM | POA: Diagnosis not present

## 2016-08-08 DIAGNOSIS — I10 Essential (primary) hypertension: Secondary | ICD-10-CM | POA: Diagnosis not present

## 2016-08-08 DIAGNOSIS — J45909 Unspecified asthma, uncomplicated: Secondary | ICD-10-CM | POA: Diagnosis not present

## 2016-08-09 DIAGNOSIS — Z471 Aftercare following joint replacement surgery: Secondary | ICD-10-CM | POA: Diagnosis not present

## 2016-08-09 DIAGNOSIS — M1991 Primary osteoarthritis, unspecified site: Secondary | ICD-10-CM | POA: Diagnosis not present

## 2016-08-09 DIAGNOSIS — L89312 Pressure ulcer of right buttock, stage 2: Secondary | ICD-10-CM | POA: Diagnosis not present

## 2016-08-09 DIAGNOSIS — I10 Essential (primary) hypertension: Secondary | ICD-10-CM | POA: Diagnosis not present

## 2016-08-09 DIAGNOSIS — G43909 Migraine, unspecified, not intractable, without status migrainosus: Secondary | ICD-10-CM | POA: Diagnosis not present

## 2016-08-09 DIAGNOSIS — D509 Iron deficiency anemia, unspecified: Secondary | ICD-10-CM | POA: Diagnosis not present

## 2016-08-09 DIAGNOSIS — I480 Paroxysmal atrial fibrillation: Secondary | ICD-10-CM | POA: Diagnosis not present

## 2016-08-09 DIAGNOSIS — J45909 Unspecified asthma, uncomplicated: Secondary | ICD-10-CM | POA: Diagnosis not present

## 2016-08-09 DIAGNOSIS — I2510 Atherosclerotic heart disease of native coronary artery without angina pectoris: Secondary | ICD-10-CM | POA: Diagnosis not present

## 2016-08-10 DIAGNOSIS — M1991 Primary osteoarthritis, unspecified site: Secondary | ICD-10-CM | POA: Diagnosis not present

## 2016-08-10 DIAGNOSIS — G43909 Migraine, unspecified, not intractable, without status migrainosus: Secondary | ICD-10-CM | POA: Diagnosis not present

## 2016-08-10 DIAGNOSIS — D509 Iron deficiency anemia, unspecified: Secondary | ICD-10-CM | POA: Diagnosis not present

## 2016-08-10 DIAGNOSIS — L89312 Pressure ulcer of right buttock, stage 2: Secondary | ICD-10-CM | POA: Diagnosis not present

## 2016-08-10 DIAGNOSIS — I2510 Atherosclerotic heart disease of native coronary artery without angina pectoris: Secondary | ICD-10-CM | POA: Diagnosis not present

## 2016-08-10 DIAGNOSIS — Z471 Aftercare following joint replacement surgery: Secondary | ICD-10-CM | POA: Diagnosis not present

## 2016-08-10 DIAGNOSIS — I480 Paroxysmal atrial fibrillation: Secondary | ICD-10-CM | POA: Diagnosis not present

## 2016-08-10 DIAGNOSIS — J45909 Unspecified asthma, uncomplicated: Secondary | ICD-10-CM | POA: Diagnosis not present

## 2016-08-10 DIAGNOSIS — I10 Essential (primary) hypertension: Secondary | ICD-10-CM | POA: Diagnosis not present

## 2016-08-13 DIAGNOSIS — I480 Paroxysmal atrial fibrillation: Secondary | ICD-10-CM | POA: Diagnosis not present

## 2016-08-13 DIAGNOSIS — J45909 Unspecified asthma, uncomplicated: Secondary | ICD-10-CM | POA: Diagnosis not present

## 2016-08-13 DIAGNOSIS — D509 Iron deficiency anemia, unspecified: Secondary | ICD-10-CM | POA: Diagnosis not present

## 2016-08-13 DIAGNOSIS — G43909 Migraine, unspecified, not intractable, without status migrainosus: Secondary | ICD-10-CM | POA: Diagnosis not present

## 2016-08-13 DIAGNOSIS — M1991 Primary osteoarthritis, unspecified site: Secondary | ICD-10-CM | POA: Diagnosis not present

## 2016-08-13 DIAGNOSIS — Z471 Aftercare following joint replacement surgery: Secondary | ICD-10-CM | POA: Diagnosis not present

## 2016-08-13 DIAGNOSIS — L89312 Pressure ulcer of right buttock, stage 2: Secondary | ICD-10-CM | POA: Diagnosis not present

## 2016-08-13 DIAGNOSIS — I2510 Atherosclerotic heart disease of native coronary artery without angina pectoris: Secondary | ICD-10-CM | POA: Diagnosis not present

## 2016-08-13 DIAGNOSIS — I10 Essential (primary) hypertension: Secondary | ICD-10-CM | POA: Diagnosis not present

## 2016-08-16 DIAGNOSIS — D509 Iron deficiency anemia, unspecified: Secondary | ICD-10-CM | POA: Diagnosis not present

## 2016-08-16 DIAGNOSIS — G43909 Migraine, unspecified, not intractable, without status migrainosus: Secondary | ICD-10-CM | POA: Diagnosis not present

## 2016-08-16 DIAGNOSIS — J45909 Unspecified asthma, uncomplicated: Secondary | ICD-10-CM | POA: Diagnosis not present

## 2016-08-16 DIAGNOSIS — M1991 Primary osteoarthritis, unspecified site: Secondary | ICD-10-CM | POA: Diagnosis not present

## 2016-08-16 DIAGNOSIS — I480 Paroxysmal atrial fibrillation: Secondary | ICD-10-CM | POA: Diagnosis not present

## 2016-08-16 DIAGNOSIS — Z471 Aftercare following joint replacement surgery: Secondary | ICD-10-CM | POA: Diagnosis not present

## 2016-08-16 DIAGNOSIS — I10 Essential (primary) hypertension: Secondary | ICD-10-CM | POA: Diagnosis not present

## 2016-08-16 DIAGNOSIS — I2510 Atherosclerotic heart disease of native coronary artery without angina pectoris: Secondary | ICD-10-CM | POA: Diagnosis not present

## 2016-08-16 DIAGNOSIS — L89312 Pressure ulcer of right buttock, stage 2: Secondary | ICD-10-CM | POA: Diagnosis not present

## 2016-08-20 DIAGNOSIS — L89312 Pressure ulcer of right buttock, stage 2: Secondary | ICD-10-CM | POA: Diagnosis not present

## 2016-08-20 DIAGNOSIS — D509 Iron deficiency anemia, unspecified: Secondary | ICD-10-CM | POA: Diagnosis not present

## 2016-08-20 DIAGNOSIS — Z471 Aftercare following joint replacement surgery: Secondary | ICD-10-CM | POA: Diagnosis not present

## 2016-08-20 DIAGNOSIS — I2510 Atherosclerotic heart disease of native coronary artery without angina pectoris: Secondary | ICD-10-CM | POA: Diagnosis not present

## 2016-08-20 DIAGNOSIS — J45909 Unspecified asthma, uncomplicated: Secondary | ICD-10-CM | POA: Diagnosis not present

## 2016-08-20 DIAGNOSIS — M1991 Primary osteoarthritis, unspecified site: Secondary | ICD-10-CM | POA: Diagnosis not present

## 2016-08-20 DIAGNOSIS — I10 Essential (primary) hypertension: Secondary | ICD-10-CM | POA: Diagnosis not present

## 2016-08-20 DIAGNOSIS — I480 Paroxysmal atrial fibrillation: Secondary | ICD-10-CM | POA: Diagnosis not present

## 2016-08-20 DIAGNOSIS — G43909 Migraine, unspecified, not intractable, without status migrainosus: Secondary | ICD-10-CM | POA: Diagnosis not present

## 2016-08-21 DIAGNOSIS — G43909 Migraine, unspecified, not intractable, without status migrainosus: Secondary | ICD-10-CM | POA: Diagnosis not present

## 2016-08-21 DIAGNOSIS — Z471 Aftercare following joint replacement surgery: Secondary | ICD-10-CM | POA: Diagnosis not present

## 2016-08-21 DIAGNOSIS — I10 Essential (primary) hypertension: Secondary | ICD-10-CM | POA: Diagnosis not present

## 2016-08-21 DIAGNOSIS — D509 Iron deficiency anemia, unspecified: Secondary | ICD-10-CM | POA: Diagnosis not present

## 2016-08-21 DIAGNOSIS — L89312 Pressure ulcer of right buttock, stage 2: Secondary | ICD-10-CM | POA: Diagnosis not present

## 2016-08-21 DIAGNOSIS — M1991 Primary osteoarthritis, unspecified site: Secondary | ICD-10-CM | POA: Diagnosis not present

## 2016-08-21 DIAGNOSIS — I480 Paroxysmal atrial fibrillation: Secondary | ICD-10-CM | POA: Diagnosis not present

## 2016-08-21 DIAGNOSIS — J45909 Unspecified asthma, uncomplicated: Secondary | ICD-10-CM | POA: Diagnosis not present

## 2016-08-21 DIAGNOSIS — I2510 Atherosclerotic heart disease of native coronary artery without angina pectoris: Secondary | ICD-10-CM | POA: Diagnosis not present

## 2016-08-22 DIAGNOSIS — Z471 Aftercare following joint replacement surgery: Secondary | ICD-10-CM | POA: Diagnosis not present

## 2016-08-22 DIAGNOSIS — D509 Iron deficiency anemia, unspecified: Secondary | ICD-10-CM | POA: Diagnosis not present

## 2016-08-22 DIAGNOSIS — I10 Essential (primary) hypertension: Secondary | ICD-10-CM | POA: Diagnosis not present

## 2016-08-22 DIAGNOSIS — M1991 Primary osteoarthritis, unspecified site: Secondary | ICD-10-CM | POA: Diagnosis not present

## 2016-08-22 DIAGNOSIS — J45909 Unspecified asthma, uncomplicated: Secondary | ICD-10-CM | POA: Diagnosis not present

## 2016-08-22 DIAGNOSIS — L89312 Pressure ulcer of right buttock, stage 2: Secondary | ICD-10-CM | POA: Diagnosis not present

## 2016-08-22 DIAGNOSIS — G43909 Migraine, unspecified, not intractable, without status migrainosus: Secondary | ICD-10-CM | POA: Diagnosis not present

## 2016-08-22 DIAGNOSIS — I2510 Atherosclerotic heart disease of native coronary artery without angina pectoris: Secondary | ICD-10-CM | POA: Diagnosis not present

## 2016-08-22 DIAGNOSIS — I480 Paroxysmal atrial fibrillation: Secondary | ICD-10-CM | POA: Diagnosis not present

## 2016-08-23 DIAGNOSIS — L89312 Pressure ulcer of right buttock, stage 2: Secondary | ICD-10-CM | POA: Diagnosis not present

## 2016-08-23 DIAGNOSIS — I480 Paroxysmal atrial fibrillation: Secondary | ICD-10-CM | POA: Diagnosis not present

## 2016-08-23 DIAGNOSIS — Z471 Aftercare following joint replacement surgery: Secondary | ICD-10-CM | POA: Diagnosis not present

## 2016-08-23 DIAGNOSIS — J45909 Unspecified asthma, uncomplicated: Secondary | ICD-10-CM | POA: Diagnosis not present

## 2016-08-23 DIAGNOSIS — M1991 Primary osteoarthritis, unspecified site: Secondary | ICD-10-CM | POA: Diagnosis not present

## 2016-08-23 DIAGNOSIS — D509 Iron deficiency anemia, unspecified: Secondary | ICD-10-CM | POA: Diagnosis not present

## 2016-08-23 DIAGNOSIS — I2510 Atherosclerotic heart disease of native coronary artery without angina pectoris: Secondary | ICD-10-CM | POA: Diagnosis not present

## 2016-08-23 DIAGNOSIS — G43909 Migraine, unspecified, not intractable, without status migrainosus: Secondary | ICD-10-CM | POA: Diagnosis not present

## 2016-08-23 DIAGNOSIS — I10 Essential (primary) hypertension: Secondary | ICD-10-CM | POA: Diagnosis not present

## 2016-08-28 DIAGNOSIS — I10 Essential (primary) hypertension: Secondary | ICD-10-CM | POA: Diagnosis not present

## 2016-08-28 DIAGNOSIS — L89312 Pressure ulcer of right buttock, stage 2: Secondary | ICD-10-CM | POA: Diagnosis not present

## 2016-08-28 DIAGNOSIS — G43909 Migraine, unspecified, not intractable, without status migrainosus: Secondary | ICD-10-CM | POA: Diagnosis not present

## 2016-08-28 DIAGNOSIS — I2510 Atherosclerotic heart disease of native coronary artery without angina pectoris: Secondary | ICD-10-CM | POA: Diagnosis not present

## 2016-08-28 DIAGNOSIS — J45909 Unspecified asthma, uncomplicated: Secondary | ICD-10-CM | POA: Diagnosis not present

## 2016-08-28 DIAGNOSIS — D509 Iron deficiency anemia, unspecified: Secondary | ICD-10-CM | POA: Diagnosis not present

## 2016-08-28 DIAGNOSIS — I480 Paroxysmal atrial fibrillation: Secondary | ICD-10-CM | POA: Diagnosis not present

## 2016-08-28 DIAGNOSIS — Z471 Aftercare following joint replacement surgery: Secondary | ICD-10-CM | POA: Diagnosis not present

## 2016-08-28 DIAGNOSIS — M1991 Primary osteoarthritis, unspecified site: Secondary | ICD-10-CM | POA: Diagnosis not present

## 2016-08-31 DIAGNOSIS — Z471 Aftercare following joint replacement surgery: Secondary | ICD-10-CM | POA: Diagnosis not present

## 2016-08-31 DIAGNOSIS — D509 Iron deficiency anemia, unspecified: Secondary | ICD-10-CM | POA: Diagnosis not present

## 2016-08-31 DIAGNOSIS — I10 Essential (primary) hypertension: Secondary | ICD-10-CM | POA: Diagnosis not present

## 2016-08-31 DIAGNOSIS — M1991 Primary osteoarthritis, unspecified site: Secondary | ICD-10-CM | POA: Diagnosis not present

## 2016-08-31 DIAGNOSIS — L89312 Pressure ulcer of right buttock, stage 2: Secondary | ICD-10-CM | POA: Diagnosis not present

## 2016-08-31 DIAGNOSIS — I480 Paroxysmal atrial fibrillation: Secondary | ICD-10-CM | POA: Diagnosis not present

## 2016-08-31 DIAGNOSIS — I2510 Atherosclerotic heart disease of native coronary artery without angina pectoris: Secondary | ICD-10-CM | POA: Diagnosis not present

## 2016-08-31 DIAGNOSIS — J45909 Unspecified asthma, uncomplicated: Secondary | ICD-10-CM | POA: Diagnosis not present

## 2016-08-31 DIAGNOSIS — G43909 Migraine, unspecified, not intractable, without status migrainosus: Secondary | ICD-10-CM | POA: Diagnosis not present

## 2016-09-03 DIAGNOSIS — I480 Paroxysmal atrial fibrillation: Secondary | ICD-10-CM | POA: Diagnosis not present

## 2016-09-03 DIAGNOSIS — Z471 Aftercare following joint replacement surgery: Secondary | ICD-10-CM | POA: Diagnosis not present

## 2016-09-03 DIAGNOSIS — M1991 Primary osteoarthritis, unspecified site: Secondary | ICD-10-CM | POA: Diagnosis not present

## 2016-09-05 DIAGNOSIS — D649 Anemia, unspecified: Secondary | ICD-10-CM | POA: Diagnosis not present

## 2016-09-05 DIAGNOSIS — G4733 Obstructive sleep apnea (adult) (pediatric): Secondary | ICD-10-CM | POA: Diagnosis not present

## 2016-09-05 DIAGNOSIS — R51 Headache: Secondary | ICD-10-CM | POA: Diagnosis not present

## 2016-09-05 DIAGNOSIS — R404 Transient alteration of awareness: Secondary | ICD-10-CM | POA: Diagnosis not present

## 2016-09-05 DIAGNOSIS — N289 Disorder of kidney and ureter, unspecified: Secondary | ICD-10-CM | POA: Diagnosis not present

## 2016-09-05 DIAGNOSIS — I480 Paroxysmal atrial fibrillation: Secondary | ICD-10-CM | POA: Diagnosis not present

## 2016-09-05 DIAGNOSIS — R918 Other nonspecific abnormal finding of lung field: Secondary | ICD-10-CM | POA: Diagnosis not present

## 2016-09-05 DIAGNOSIS — E890 Postprocedural hypothyroidism: Secondary | ICD-10-CM | POA: Diagnosis not present

## 2016-09-05 DIAGNOSIS — G935 Compression of brain: Secondary | ICD-10-CM | POA: Diagnosis not present

## 2016-09-12 DIAGNOSIS — I480 Paroxysmal atrial fibrillation: Secondary | ICD-10-CM | POA: Diagnosis not present

## 2016-09-12 DIAGNOSIS — E782 Mixed hyperlipidemia: Secondary | ICD-10-CM | POA: Diagnosis not present

## 2016-09-12 DIAGNOSIS — R5382 Chronic fatigue, unspecified: Secondary | ICD-10-CM | POA: Diagnosis not present

## 2016-09-12 DIAGNOSIS — D649 Anemia, unspecified: Secondary | ICD-10-CM | POA: Diagnosis not present

## 2016-09-12 DIAGNOSIS — R5381 Other malaise: Secondary | ICD-10-CM | POA: Diagnosis not present

## 2016-09-12 DIAGNOSIS — E892 Postprocedural hypoparathyroidism: Secondary | ICD-10-CM | POA: Diagnosis not present

## 2016-09-12 DIAGNOSIS — G935 Compression of brain: Secondary | ICD-10-CM | POA: Diagnosis not present

## 2016-09-12 DIAGNOSIS — G4733 Obstructive sleep apnea (adult) (pediatric): Secondary | ICD-10-CM | POA: Diagnosis not present

## 2016-09-14 DIAGNOSIS — D649 Anemia, unspecified: Secondary | ICD-10-CM | POA: Diagnosis not present

## 2016-09-24 DIAGNOSIS — I2510 Atherosclerotic heart disease of native coronary artery without angina pectoris: Secondary | ICD-10-CM | POA: Diagnosis not present

## 2016-09-24 DIAGNOSIS — I4891 Unspecified atrial fibrillation: Secondary | ICD-10-CM | POA: Diagnosis not present

## 2016-09-24 DIAGNOSIS — E785 Hyperlipidemia, unspecified: Secondary | ICD-10-CM | POA: Diagnosis not present

## 2016-09-24 DIAGNOSIS — R0602 Shortness of breath: Secondary | ICD-10-CM | POA: Diagnosis not present

## 2016-09-24 DIAGNOSIS — I10 Essential (primary) hypertension: Secondary | ICD-10-CM | POA: Diagnosis not present

## 2016-09-28 DIAGNOSIS — R079 Chest pain, unspecified: Secondary | ICD-10-CM | POA: Diagnosis not present

## 2016-10-02 DIAGNOSIS — I4891 Unspecified atrial fibrillation: Secondary | ICD-10-CM | POA: Diagnosis not present

## 2016-10-02 DIAGNOSIS — E785 Hyperlipidemia, unspecified: Secondary | ICD-10-CM | POA: Diagnosis not present

## 2016-10-02 DIAGNOSIS — I2510 Atherosclerotic heart disease of native coronary artery without angina pectoris: Secondary | ICD-10-CM | POA: Diagnosis not present

## 2016-10-02 DIAGNOSIS — R0602 Shortness of breath: Secondary | ICD-10-CM | POA: Diagnosis not present

## 2016-10-02 DIAGNOSIS — I10 Essential (primary) hypertension: Secondary | ICD-10-CM | POA: Diagnosis not present

## 2016-11-02 DIAGNOSIS — Z85850 Personal history of malignant neoplasm of thyroid: Secondary | ICD-10-CM | POA: Diagnosis not present

## 2016-11-02 DIAGNOSIS — E890 Postprocedural hypothyroidism: Secondary | ICD-10-CM | POA: Diagnosis not present

## 2016-11-02 DIAGNOSIS — E892 Postprocedural hypoparathyroidism: Secondary | ICD-10-CM | POA: Diagnosis not present

## 2016-11-06 DIAGNOSIS — R296 Repeated falls: Secondary | ICD-10-CM | POA: Diagnosis not present

## 2016-11-06 DIAGNOSIS — R51 Headache: Secondary | ICD-10-CM | POA: Diagnosis not present

## 2016-11-09 DIAGNOSIS — E890 Postprocedural hypothyroidism: Secondary | ICD-10-CM | POA: Diagnosis not present

## 2016-11-09 DIAGNOSIS — Z85850 Personal history of malignant neoplasm of thyroid: Secondary | ICD-10-CM | POA: Diagnosis not present

## 2016-11-09 DIAGNOSIS — E892 Postprocedural hypoparathyroidism: Secondary | ICD-10-CM | POA: Diagnosis not present

## 2016-12-03 DIAGNOSIS — M545 Low back pain: Secondary | ICD-10-CM | POA: Diagnosis not present

## 2016-12-03 DIAGNOSIS — M5442 Lumbago with sciatica, left side: Secondary | ICD-10-CM | POA: Diagnosis not present

## 2016-12-03 DIAGNOSIS — R300 Dysuria: Secondary | ICD-10-CM | POA: Diagnosis not present

## 2016-12-14 ENCOUNTER — Other Ambulatory Visit: Payer: Self-pay | Admitting: Family Medicine

## 2016-12-14 ENCOUNTER — Ambulatory Visit
Admission: RE | Admit: 2016-12-14 | Discharge: 2016-12-14 | Disposition: A | Discharge status: Home / Self Care | Payer: Medicare HMO | Source: Ambulatory Visit | Attending: Family Medicine | Admitting: Family Medicine

## 2016-12-14 DIAGNOSIS — R55 Syncope and collapse: Secondary | ICD-10-CM | POA: Diagnosis not present

## 2016-12-14 DIAGNOSIS — M25552 Pain in left hip: Secondary | ICD-10-CM | POA: Diagnosis not present

## 2016-12-14 DIAGNOSIS — G935 Compression of brain: Secondary | ICD-10-CM | POA: Diagnosis not present

## 2016-12-14 DIAGNOSIS — R51 Headache: Secondary | ICD-10-CM | POA: Diagnosis not present

## 2016-12-14 DIAGNOSIS — I63512 Cerebral infarction due to unspecified occlusion or stenosis of left middle cerebral artery: Secondary | ICD-10-CM | POA: Insufficient documentation

## 2016-12-14 DIAGNOSIS — M79605 Pain in left leg: Secondary | ICD-10-CM | POA: Diagnosis not present

## 2016-12-14 DIAGNOSIS — S79912A Unspecified injury of left hip, initial encounter: Secondary | ICD-10-CM | POA: Diagnosis not present

## 2016-12-18 ENCOUNTER — Other Ambulatory Visit: Payer: Self-pay | Admitting: Family Medicine

## 2016-12-18 DIAGNOSIS — R55 Syncope and collapse: Secondary | ICD-10-CM

## 2016-12-24 ENCOUNTER — Ambulatory Visit
Admission: RE | Admit: 2016-12-24 | Discharge: 2016-12-24 | Disposition: A | Discharge status: Home / Self Care | Payer: Medicare HMO | Source: Ambulatory Visit | Attending: Family Medicine | Admitting: Family Medicine

## 2016-12-24 DIAGNOSIS — I6523 Occlusion and stenosis of bilateral carotid arteries: Secondary | ICD-10-CM | POA: Diagnosis not present

## 2016-12-24 DIAGNOSIS — I6521 Occlusion and stenosis of right carotid artery: Secondary | ICD-10-CM | POA: Diagnosis not present

## 2016-12-24 DIAGNOSIS — R55 Syncope and collapse: Secondary | ICD-10-CM | POA: Diagnosis not present

## 2017-01-09 DIAGNOSIS — E892 Postprocedural hypoparathyroidism: Secondary | ICD-10-CM | POA: Diagnosis not present

## 2017-01-09 DIAGNOSIS — E7800 Pure hypercholesterolemia, unspecified: Secondary | ICD-10-CM | POA: Diagnosis not present

## 2017-01-09 DIAGNOSIS — G935 Compression of brain: Secondary | ICD-10-CM | POA: Diagnosis not present

## 2017-01-09 DIAGNOSIS — G4733 Obstructive sleep apnea (adult) (pediatric): Secondary | ICD-10-CM | POA: Diagnosis not present

## 2017-01-09 DIAGNOSIS — R5381 Other malaise: Secondary | ICD-10-CM | POA: Diagnosis not present

## 2017-01-09 DIAGNOSIS — E782 Mixed hyperlipidemia: Secondary | ICD-10-CM | POA: Diagnosis not present

## 2017-01-09 DIAGNOSIS — R5382 Chronic fatigue, unspecified: Secondary | ICD-10-CM | POA: Diagnosis not present

## 2017-01-09 DIAGNOSIS — D649 Anemia, unspecified: Secondary | ICD-10-CM | POA: Diagnosis not present

## 2017-01-09 DIAGNOSIS — I480 Paroxysmal atrial fibrillation: Secondary | ICD-10-CM | POA: Diagnosis not present

## 2017-01-16 DIAGNOSIS — E890 Postprocedural hypothyroidism: Secondary | ICD-10-CM | POA: Diagnosis not present

## 2017-01-16 DIAGNOSIS — D649 Anemia, unspecified: Secondary | ICD-10-CM | POA: Diagnosis not present

## 2017-01-16 DIAGNOSIS — N183 Chronic kidney disease, stage 3 unspecified: Secondary | ICD-10-CM | POA: Insufficient documentation

## 2017-01-16 DIAGNOSIS — G4733 Obstructive sleep apnea (adult) (pediatric): Secondary | ICD-10-CM | POA: Diagnosis not present

## 2017-01-16 DIAGNOSIS — Z0000 Encounter for general adult medical examination without abnormal findings: Secondary | ICD-10-CM | POA: Diagnosis not present

## 2017-01-16 DIAGNOSIS — Z85850 Personal history of malignant neoplasm of thyroid: Secondary | ICD-10-CM | POA: Diagnosis not present

## 2017-01-16 DIAGNOSIS — I10 Essential (primary) hypertension: Secondary | ICD-10-CM | POA: Diagnosis not present

## 2017-01-16 DIAGNOSIS — Z1231 Encounter for screening mammogram for malignant neoplasm of breast: Secondary | ICD-10-CM | POA: Diagnosis not present

## 2017-01-16 DIAGNOSIS — G935 Compression of brain: Secondary | ICD-10-CM | POA: Diagnosis not present

## 2017-01-16 DIAGNOSIS — I480 Paroxysmal atrial fibrillation: Secondary | ICD-10-CM | POA: Diagnosis not present

## 2017-01-16 DIAGNOSIS — E892 Postprocedural hypoparathyroidism: Secondary | ICD-10-CM | POA: Diagnosis not present

## 2017-01-17 DIAGNOSIS — H35373 Puckering of macula, bilateral: Secondary | ICD-10-CM | POA: Diagnosis not present

## 2017-01-30 DIAGNOSIS — G4733 Obstructive sleep apnea (adult) (pediatric): Secondary | ICD-10-CM | POA: Diagnosis not present

## 2017-01-30 DIAGNOSIS — J4520 Mild intermittent asthma, uncomplicated: Secondary | ICD-10-CM | POA: Diagnosis not present

## 2017-01-30 DIAGNOSIS — R918 Other nonspecific abnormal finding of lung field: Secondary | ICD-10-CM | POA: Diagnosis not present

## 2017-02-21 DIAGNOSIS — G935 Compression of brain: Secondary | ICD-10-CM | POA: Diagnosis not present

## 2017-02-25 DIAGNOSIS — R1013 Epigastric pain: Secondary | ICD-10-CM | POA: Diagnosis not present

## 2017-02-25 DIAGNOSIS — I480 Paroxysmal atrial fibrillation: Secondary | ICD-10-CM | POA: Diagnosis not present

## 2017-02-25 DIAGNOSIS — G4733 Obstructive sleep apnea (adult) (pediatric): Secondary | ICD-10-CM | POA: Diagnosis not present

## 2017-02-25 DIAGNOSIS — R309 Painful micturition, unspecified: Secondary | ICD-10-CM | POA: Diagnosis not present

## 2017-02-25 DIAGNOSIS — I10 Essential (primary) hypertension: Secondary | ICD-10-CM | POA: Diagnosis not present

## 2017-02-25 DIAGNOSIS — G935 Compression of brain: Secondary | ICD-10-CM | POA: Diagnosis not present

## 2017-02-25 DIAGNOSIS — Z23 Encounter for immunization: Secondary | ICD-10-CM | POA: Diagnosis not present

## 2017-03-05 DIAGNOSIS — I10 Essential (primary) hypertension: Secondary | ICD-10-CM | POA: Diagnosis not present

## 2017-03-05 DIAGNOSIS — R0602 Shortness of breath: Secondary | ICD-10-CM | POA: Diagnosis not present

## 2017-03-05 DIAGNOSIS — I2510 Atherosclerotic heart disease of native coronary artery without angina pectoris: Secondary | ICD-10-CM | POA: Diagnosis not present

## 2017-03-05 DIAGNOSIS — I4891 Unspecified atrial fibrillation: Secondary | ICD-10-CM | POA: Diagnosis not present

## 2017-03-05 DIAGNOSIS — K210 Gastro-esophageal reflux disease with esophagitis: Secondary | ICD-10-CM | POA: Diagnosis not present

## 2017-03-05 DIAGNOSIS — E785 Hyperlipidemia, unspecified: Secondary | ICD-10-CM | POA: Diagnosis not present

## 2017-03-29 DIAGNOSIS — E876 Hypokalemia: Secondary | ICD-10-CM | POA: Diagnosis not present

## 2017-04-22 DIAGNOSIS — R42 Dizziness and giddiness: Secondary | ICD-10-CM | POA: Diagnosis not present

## 2017-04-22 DIAGNOSIS — R300 Dysuria: Secondary | ICD-10-CM | POA: Diagnosis not present

## 2017-04-22 DIAGNOSIS — E039 Hypothyroidism, unspecified: Secondary | ICD-10-CM | POA: Diagnosis not present

## 2017-04-22 DIAGNOSIS — N183 Chronic kidney disease, stage 3 (moderate): Secondary | ICD-10-CM | POA: Diagnosis not present

## 2017-04-22 DIAGNOSIS — D649 Anemia, unspecified: Secondary | ICD-10-CM | POA: Diagnosis not present

## 2017-04-24 DIAGNOSIS — R42 Dizziness and giddiness: Secondary | ICD-10-CM | POA: Diagnosis not present

## 2017-04-24 DIAGNOSIS — I10 Essential (primary) hypertension: Secondary | ICD-10-CM | POA: Diagnosis not present

## 2017-04-24 DIAGNOSIS — I2510 Atherosclerotic heart disease of native coronary artery without angina pectoris: Secondary | ICD-10-CM | POA: Diagnosis not present

## 2017-04-24 DIAGNOSIS — E785 Hyperlipidemia, unspecified: Secondary | ICD-10-CM | POA: Diagnosis not present

## 2017-04-24 DIAGNOSIS — G4733 Obstructive sleep apnea (adult) (pediatric): Secondary | ICD-10-CM | POA: Diagnosis not present

## 2017-05-09 DIAGNOSIS — R42 Dizziness and giddiness: Secondary | ICD-10-CM | POA: Diagnosis not present

## 2017-05-09 DIAGNOSIS — I2510 Atherosclerotic heart disease of native coronary artery without angina pectoris: Secondary | ICD-10-CM | POA: Diagnosis not present

## 2017-05-09 DIAGNOSIS — I4891 Unspecified atrial fibrillation: Secondary | ICD-10-CM | POA: Diagnosis not present

## 2017-05-09 DIAGNOSIS — G4733 Obstructive sleep apnea (adult) (pediatric): Secondary | ICD-10-CM | POA: Diagnosis not present

## 2017-05-09 DIAGNOSIS — I10 Essential (primary) hypertension: Secondary | ICD-10-CM | POA: Diagnosis not present

## 2017-05-16 DIAGNOSIS — J9811 Atelectasis: Secondary | ICD-10-CM | POA: Diagnosis not present

## 2017-05-16 DIAGNOSIS — N183 Chronic kidney disease, stage 3 (moderate): Secondary | ICD-10-CM | POA: Diagnosis not present

## 2017-05-16 DIAGNOSIS — Z0000 Encounter for general adult medical examination without abnormal findings: Secondary | ICD-10-CM | POA: Diagnosis not present

## 2017-05-16 DIAGNOSIS — I10 Essential (primary) hypertension: Secondary | ICD-10-CM | POA: Diagnosis not present

## 2017-05-16 DIAGNOSIS — G935 Compression of brain: Secondary | ICD-10-CM | POA: Diagnosis not present

## 2017-05-16 DIAGNOSIS — D649 Anemia, unspecified: Secondary | ICD-10-CM | POA: Diagnosis not present

## 2017-05-16 DIAGNOSIS — E890 Postprocedural hypothyroidism: Secondary | ICD-10-CM | POA: Diagnosis not present

## 2017-05-16 DIAGNOSIS — R05 Cough: Secondary | ICD-10-CM | POA: Diagnosis not present

## 2017-05-16 DIAGNOSIS — G4733 Obstructive sleep apnea (adult) (pediatric): Secondary | ICD-10-CM | POA: Diagnosis not present

## 2017-05-16 DIAGNOSIS — I480 Paroxysmal atrial fibrillation: Secondary | ICD-10-CM | POA: Diagnosis not present

## 2017-05-16 DIAGNOSIS — R918 Other nonspecific abnormal finding of lung field: Secondary | ICD-10-CM | POA: Diagnosis not present

## 2017-05-20 DIAGNOSIS — G935 Compression of brain: Secondary | ICD-10-CM | POA: Diagnosis not present

## 2017-05-20 DIAGNOSIS — R5381 Other malaise: Secondary | ICD-10-CM | POA: Diagnosis not present

## 2017-05-20 DIAGNOSIS — N183 Chronic kidney disease, stage 3 (moderate): Secondary | ICD-10-CM | POA: Diagnosis not present

## 2017-05-20 DIAGNOSIS — G4733 Obstructive sleep apnea (adult) (pediatric): Secondary | ICD-10-CM | POA: Diagnosis not present

## 2017-05-20 DIAGNOSIS — R5383 Other fatigue: Secondary | ICD-10-CM | POA: Diagnosis not present

## 2017-05-20 DIAGNOSIS — E892 Postprocedural hypoparathyroidism: Secondary | ICD-10-CM | POA: Diagnosis not present

## 2017-05-20 DIAGNOSIS — I480 Paroxysmal atrial fibrillation: Secondary | ICD-10-CM | POA: Diagnosis not present

## 2017-05-20 DIAGNOSIS — D649 Anemia, unspecified: Secondary | ICD-10-CM | POA: Diagnosis not present

## 2017-06-20 DIAGNOSIS — R10819 Abdominal tenderness, unspecified site: Secondary | ICD-10-CM | POA: Diagnosis not present

## 2017-06-20 DIAGNOSIS — I340 Nonrheumatic mitral (valve) insufficiency: Secondary | ICD-10-CM | POA: Diagnosis not present

## 2017-06-20 DIAGNOSIS — I4891 Unspecified atrial fibrillation: Secondary | ICD-10-CM | POA: Diagnosis not present

## 2017-06-20 DIAGNOSIS — R300 Dysuria: Secondary | ICD-10-CM | POA: Diagnosis not present

## 2017-06-20 DIAGNOSIS — G4733 Obstructive sleep apnea (adult) (pediatric): Secondary | ICD-10-CM | POA: Diagnosis not present

## 2017-06-20 DIAGNOSIS — I2510 Atherosclerotic heart disease of native coronary artery without angina pectoris: Secondary | ICD-10-CM | POA: Diagnosis not present

## 2017-07-10 DIAGNOSIS — E892 Postprocedural hypoparathyroidism: Secondary | ICD-10-CM | POA: Diagnosis not present

## 2017-07-10 DIAGNOSIS — E890 Postprocedural hypothyroidism: Secondary | ICD-10-CM | POA: Diagnosis not present

## 2017-07-17 DIAGNOSIS — E892 Postprocedural hypoparathyroidism: Secondary | ICD-10-CM | POA: Diagnosis not present

## 2017-07-17 DIAGNOSIS — K921 Melena: Secondary | ICD-10-CM | POA: Diagnosis not present

## 2017-07-17 DIAGNOSIS — Z85850 Personal history of malignant neoplasm of thyroid: Secondary | ICD-10-CM | POA: Diagnosis not present

## 2017-07-17 DIAGNOSIS — E890 Postprocedural hypothyroidism: Secondary | ICD-10-CM | POA: Diagnosis not present

## 2017-07-23 DIAGNOSIS — R1013 Epigastric pain: Secondary | ICD-10-CM | POA: Diagnosis not present

## 2017-07-25 DIAGNOSIS — R071 Chest pain on breathing: Secondary | ICD-10-CM | POA: Diagnosis not present

## 2017-07-25 DIAGNOSIS — I10 Essential (primary) hypertension: Secondary | ICD-10-CM | POA: Diagnosis not present

## 2017-07-25 DIAGNOSIS — I2510 Atherosclerotic heart disease of native coronary artery without angina pectoris: Secondary | ICD-10-CM | POA: Diagnosis not present

## 2017-07-25 DIAGNOSIS — I4891 Unspecified atrial fibrillation: Secondary | ICD-10-CM | POA: Diagnosis not present

## 2017-07-25 DIAGNOSIS — R0602 Shortness of breath: Secondary | ICD-10-CM | POA: Diagnosis not present

## 2017-07-25 DIAGNOSIS — E785 Hyperlipidemia, unspecified: Secondary | ICD-10-CM | POA: Diagnosis not present

## 2017-07-29 DIAGNOSIS — G4733 Obstructive sleep apnea (adult) (pediatric): Secondary | ICD-10-CM | POA: Diagnosis not present

## 2017-07-29 DIAGNOSIS — E785 Hyperlipidemia, unspecified: Secondary | ICD-10-CM | POA: Diagnosis not present

## 2017-07-29 DIAGNOSIS — I340 Nonrheumatic mitral (valve) insufficiency: Secondary | ICD-10-CM | POA: Diagnosis not present

## 2017-07-29 DIAGNOSIS — I4891 Unspecified atrial fibrillation: Secondary | ICD-10-CM | POA: Diagnosis not present

## 2017-07-29 DIAGNOSIS — I2510 Atherosclerotic heart disease of native coronary artery without angina pectoris: Secondary | ICD-10-CM | POA: Diagnosis not present

## 2017-07-29 DIAGNOSIS — R071 Chest pain on breathing: Secondary | ICD-10-CM | POA: Diagnosis not present

## 2017-07-29 DIAGNOSIS — R079 Chest pain, unspecified: Secondary | ICD-10-CM | POA: Diagnosis not present

## 2017-07-29 DIAGNOSIS — I10 Essential (primary) hypertension: Secondary | ICD-10-CM | POA: Diagnosis not present

## 2017-07-29 DIAGNOSIS — R0602 Shortness of breath: Secondary | ICD-10-CM | POA: Diagnosis not present

## 2017-08-01 DIAGNOSIS — I10 Essential (primary) hypertension: Secondary | ICD-10-CM | POA: Diagnosis not present

## 2017-08-01 DIAGNOSIS — I4891 Unspecified atrial fibrillation: Secondary | ICD-10-CM | POA: Diagnosis not present

## 2017-08-01 DIAGNOSIS — I340 Nonrheumatic mitral (valve) insufficiency: Secondary | ICD-10-CM | POA: Diagnosis not present

## 2017-08-01 DIAGNOSIS — I2510 Atherosclerotic heart disease of native coronary artery without angina pectoris: Secondary | ICD-10-CM | POA: Diagnosis not present

## 2017-08-01 DIAGNOSIS — R071 Chest pain on breathing: Secondary | ICD-10-CM | POA: Diagnosis not present

## 2017-08-01 DIAGNOSIS — R0602 Shortness of breath: Secondary | ICD-10-CM | POA: Diagnosis not present

## 2017-08-01 DIAGNOSIS — E785 Hyperlipidemia, unspecified: Secondary | ICD-10-CM | POA: Diagnosis not present

## 2017-08-01 DIAGNOSIS — G4733 Obstructive sleep apnea (adult) (pediatric): Secondary | ICD-10-CM | POA: Diagnosis not present

## 2017-08-01 DIAGNOSIS — R079 Chest pain, unspecified: Secondary | ICD-10-CM | POA: Diagnosis not present

## 2017-08-05 DIAGNOSIS — I2510 Atherosclerotic heart disease of native coronary artery without angina pectoris: Secondary | ICD-10-CM | POA: Diagnosis not present

## 2017-08-12 DIAGNOSIS — I340 Nonrheumatic mitral (valve) insufficiency: Secondary | ICD-10-CM | POA: Diagnosis not present

## 2017-08-12 DIAGNOSIS — I2510 Atherosclerotic heart disease of native coronary artery without angina pectoris: Secondary | ICD-10-CM | POA: Diagnosis not present

## 2017-08-12 DIAGNOSIS — I10 Essential (primary) hypertension: Secondary | ICD-10-CM | POA: Diagnosis not present

## 2017-08-12 DIAGNOSIS — G4733 Obstructive sleep apnea (adult) (pediatric): Secondary | ICD-10-CM | POA: Diagnosis not present

## 2017-08-19 ENCOUNTER — Telehealth: Payer: Self-pay

## 2017-08-19 DIAGNOSIS — R918 Other nonspecific abnormal finding of lung field: Secondary | ICD-10-CM

## 2017-08-19 NOTE — Telephone Encounter (Signed)
  Left message for patient to return call regarding appointments listed below.   Follow up CT Chest w/o contrast [facility] 08/30/17 @ 8:00 am arrive 7:45.   Dr.Oaks follow up 09/02/17 @ 9am.

## 2017-08-20 NOTE — Telephone Encounter (Signed)
Patient notified of the below appointments.

## 2017-08-27 DIAGNOSIS — G935 Compression of brain: Secondary | ICD-10-CM | POA: Diagnosis not present

## 2017-08-27 DIAGNOSIS — I10 Essential (primary) hypertension: Secondary | ICD-10-CM | POA: Diagnosis not present

## 2017-08-27 DIAGNOSIS — E890 Postprocedural hypothyroidism: Secondary | ICD-10-CM | POA: Diagnosis not present

## 2017-08-27 DIAGNOSIS — I480 Paroxysmal atrial fibrillation: Secondary | ICD-10-CM | POA: Diagnosis not present

## 2017-08-27 DIAGNOSIS — N9089 Other specified noninflammatory disorders of vulva and perineum: Secondary | ICD-10-CM | POA: Diagnosis not present

## 2017-08-30 ENCOUNTER — Ambulatory Visit
Admission: RE | Admit: 2017-08-30 | Discharge: 2017-08-30 | Disposition: A | Discharge status: Home / Self Care | Payer: Medicare HMO | Source: Ambulatory Visit | Attending: Cardiothoracic Surgery | Admitting: Cardiothoracic Surgery

## 2017-08-30 ENCOUNTER — Ambulatory Visit: Payer: Medicare HMO

## 2017-08-30 DIAGNOSIS — I700 Atherosclerosis of aorta: Secondary | ICD-10-CM | POA: Insufficient documentation

## 2017-08-30 DIAGNOSIS — I2584 Coronary atherosclerosis due to calcified coronary lesion: Secondary | ICD-10-CM | POA: Diagnosis not present

## 2017-08-30 DIAGNOSIS — R918 Other nonspecific abnormal finding of lung field: Secondary | ICD-10-CM | POA: Diagnosis not present

## 2017-08-30 DIAGNOSIS — I2510 Atherosclerotic heart disease of native coronary artery without angina pectoris: Secondary | ICD-10-CM | POA: Diagnosis not present

## 2017-09-02 ENCOUNTER — Encounter: Payer: Self-pay | Admitting: Cardiothoracic Surgery

## 2017-09-02 ENCOUNTER — Ambulatory Visit: Payer: Self-pay | Admitting: Cardiothoracic Surgery

## 2017-09-02 ENCOUNTER — Ambulatory Visit: Payer: Medicare HMO | Admitting: Cardiothoracic Surgery

## 2017-09-02 VITALS — BP 131/84 | HR 71 | Temp 98.2°F | Resp 18 | Ht 63.0 in | Wt 160.0 lb

## 2017-09-02 DIAGNOSIS — R911 Solitary pulmonary nodule: Secondary | ICD-10-CM | POA: Diagnosis not present

## 2017-09-02 NOTE — Progress Notes (Signed)
  Patient ID: Brenda Barber, female   DOB: [birth year], 62 y.o.   MRN: [id number]  HISTORY: She returns today in follow-up.  She did have a CT scan performed.  Although she has never smoked she does get quite short of breath with minimal activity.  She is currently on disability secondary to her [place]-term shortness of breath.  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   Vitals:   09/02/17 0836  BP: 131/84  Pulse: 71  Resp: 18  Temp: 98.2 F (36.8 C)  SpO2: 95%     EXAM:    Resp: Lungs are clear bilaterally.  No respiratory distress, normal effort. Heart:  Regular without murmurs Abd:  Abdomen is soft, non distended and non tender. No masses are palpable.  There is no rebound and no guarding.  Neurological: Alert and oriented to person, place, and time. Coordination normal.  Skin: Skin is warm and dry. No rash noted. No diaphoretic. No erythema. No pallor.  Psychiatric: Normal mood and affect. Normal behavior. Judgment and thought content normal.    ASSESSMENT: I have independently reviewed her CT scan.  There are multiple bilateral nodules in the lungs.  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   PLAN:   I would like to get the opinion of Dr. Rogue Bussing.  If he believes that there is a nonoperative approach to the management of these nodules then perhaps a biopsy would be indicated.  At the present time I did not see any need for a surgical intervention.  I will await Dr. Aletha Halim evaluation.    Nestor Lewandowsky, MD

## 2017-09-02 NOTE — Patient Instructions (Signed)
We will send the referral to [place] [facility]ologist at the [facility].  Someone from their office will contact you to schedule an appointment. If you do not hear from anyone within 5 days please call our office and let us know.

## 2017-09-09 ENCOUNTER — Inpatient Hospital Stay: Payer: Medicare HMO

## 2017-09-09 ENCOUNTER — Inpatient Hospital Stay: Payer: Medicare HMO | Attending: Internal Medicine | Admitting: Internal Medicine

## 2017-09-09 VITALS — BP 170/95 | HR 64 | Temp 97.7°F | Resp 16 | Wt 161.0 lb

## 2017-09-09 DIAGNOSIS — Z85850 Personal history of malignant neoplasm of thyroid: Secondary | ICD-10-CM | POA: Diagnosis not present

## 2017-09-09 DIAGNOSIS — K219 Gastro-esophageal reflux disease without esophagitis: Secondary | ICD-10-CM | POA: Diagnosis not present

## 2017-09-09 DIAGNOSIS — E892 Postprocedural hypoparathyroidism: Secondary | ICD-10-CM | POA: Diagnosis not present

## 2017-09-09 DIAGNOSIS — E785 Hyperlipidemia, unspecified: Secondary | ICD-10-CM | POA: Diagnosis not present

## 2017-09-09 DIAGNOSIS — J449 Chronic obstructive pulmonary disease, unspecified: Secondary | ICD-10-CM | POA: Diagnosis not present

## 2017-09-09 DIAGNOSIS — I2510 Atherosclerotic heart disease of native coronary artery without angina pectoris: Secondary | ICD-10-CM | POA: Diagnosis not present

## 2017-09-09 DIAGNOSIS — N183 Chronic kidney disease, stage 3 (moderate): Secondary | ICD-10-CM | POA: Diagnosis not present

## 2017-09-09 DIAGNOSIS — Z79899 Other long term (current) drug therapy: Secondary | ICD-10-CM | POA: Diagnosis not present

## 2017-09-09 DIAGNOSIS — R918 Other nonspecific abnormal finding of lung field: Secondary | ICD-10-CM | POA: Diagnosis not present

## 2017-09-09 DIAGNOSIS — C73 Malignant neoplasm of thyroid gland: Secondary | ICD-10-CM | POA: Insufficient documentation

## 2017-09-09 DIAGNOSIS — I129 Hypertensive chronic kidney disease with stage 1 through stage 4 chronic kidney disease, or unspecified chronic kidney disease: Secondary | ICD-10-CM | POA: Diagnosis not present

## 2017-09-09 DIAGNOSIS — E890 Postprocedural hypothyroidism: Secondary | ICD-10-CM | POA: Diagnosis not present

## 2017-09-09 DIAGNOSIS — Z7901 Long term (current) use of anticoagulants: Secondary | ICD-10-CM | POA: Diagnosis not present

## 2017-09-09 DIAGNOSIS — Z8673 Personal history of transient ischemic attack (TIA), and cerebral infarction without residual deficits: Secondary | ICD-10-CM | POA: Insufficient documentation

## 2017-09-09 DIAGNOSIS — I4891 Unspecified atrial fibrillation: Secondary | ICD-10-CM | POA: Diagnosis not present

## 2017-09-09 LAB — CBC WITH DIFFERENTIAL/PLATELET
Basophils Absolute: 0.1 10*3/uL (ref 0–0.1)
Basophils Relative: 1 %
EOS ABS: 0.1 10*3/uL (ref 0–0.7)
Eosinophils Relative: 2 %
HCT: 34.7 % — ABNORMAL LOW (ref 35.0–47.0)
HEMOGLOBIN: 11.7 g/dL — AB (ref 12.0–16.0)
LYMPHS ABS: 1.7 10*3/uL (ref 1.0–3.6)
LYMPHS PCT: 21 %
MCH: 28.6 pg (ref 26.0–34.0)
MCHC: 33.6 g/dL (ref 32.0–36.0)
MCV: 85.1 fL (ref 80.0–100.0)
MONOS PCT: 7 %
Monocytes Absolute: 0.5 10*3/uL (ref 0.2–0.9)
NEUTROS PCT: 69 %
Neutro Abs: 5.5 10*3/uL (ref 1.4–6.5)
Platelets: 222 10*3/uL (ref 150–440)
RBC: 4.08 MIL/uL (ref 3.80–5.20)
RDW: 16.6 % — ABNORMAL HIGH (ref 11.5–14.5)
WBC: 8 10*3/uL (ref 3.6–11.0)

## 2017-09-09 LAB — COMPREHENSIVE METABOLIC PANEL
ALK PHOS: 103 U/L (ref 38–126)
ALT: 33 U/L (ref 14–54)
ANION GAP: 11 (ref 5–15)
AST: 30 U/L (ref 15–41)
Albumin: 4.3 g/dL (ref 3.5–5.0)
BILIRUBIN TOTAL: 0.7 mg/dL (ref 0.3–1.2)
BUN: 21 mg/dL — ABNORMAL HIGH (ref 6–20)
CALCIUM: 9.2 mg/dL (ref 8.9–10.3)
CO2: 26 mmol/L (ref 22–32)
CREATININE: 1.37 mg/dL — AB (ref 0.44–1.00)
Chloride: 106 mmol/L (ref 101–111)
GFR calc non Af Amer: 41 mL/min — ABNORMAL LOW (ref >60)
GFR, EST AFRICAN AMERICAN: 47 mL/min — AB (ref >60)
Glucose, Bld: 87 mg/dL (ref 65–99)
Potassium: 3.2 mmol/L — ABNORMAL LOW (ref 3.5–5.1)
Sodium: 143 mmol/L (ref 135–145)
TOTAL PROTEIN: 7.4 g/dL (ref 6.5–8.1)

## 2017-09-09 LAB — FERRITIN: Ferritin: 25 ng/mL (ref 11–307)

## 2017-09-09 LAB — IRON AND TIBC
IRON: 49 ug/dL (ref 28–170)
Saturation Ratios: 14 % (ref 10.4–31.8)
TIBC: 351 ug/dL (ref 250–450)
UIBC: 302 ug/dL

## 2017-09-09 NOTE — Assessment & Plan Note (Addendum)
#   multiple lung nodules/groudn glass opacities [at least since 2015]-slightly progressive on the most recent CT scan April 2019.  Discussed the multiple etiologies of the lung nodules-including but not limited to slow-growing lung cancer /adenocarcinoma with a lipidic pattern; inflammatory versus infectious versus drug-induced [amiodarone] versus metastatic disease [thyroid cancer less likely].  Will review the imaging at the tumor conference; also discussed that only biopsy would confirm the the diagnosis.  Would await tumor conference recommendations.  # Anemia/CKD-stage III; hemoglobin around 10; check iron studies CBC ferritin.  Patient on p.o. iron.  # Thyroid cancer-2000 at [facility] status post radioactive iodine; unlikely recurrent disease because of her lung nodules.  Patient on TSH/followed by endocrine.  Check thyroglobulin levels;   # A.fib/eliquis [Dr.Khan]- on amiodarone ~ 5 years.  Question because of lung nodules.  #In approximately 2-3 weeks patient will follow-up with me/reviewed labs; next plan of care.  Thank you Dr.Oaks for allowing me to participate in the care of your pleasant patient. Please do not hesitate to contact me with questions or concerns in the interim.

## 2017-09-09 NOTE — Progress Notes (Signed)
[place] NOTE  Patient Care Team: [name], MD as PCP - General (Internal Medicine)  CHIEF COMPLAINTS/PURPOSE OF CONSULTATION:  Multiple lung nodules  #  Oncology History   # Lung nodules/GOO Multiple [2015; Dr.Oaks].  # Thyroid cancer [2000; [facility]] s/p RAI; post-surgical hypothyroidism/ post-surgical hypoparathyroidism.s/p  total thyroidectomy and was found to have a 6 cm follicular thyroid cancer without any local invasion. This was a X3A3FT follicular thyroid cancer, stage I. S/p radioiodine (115 mCi) and subsequently in 2005, 2006, and 2008 had serially negative thyrogen-stimulated whole body scans.   # CKD/ stage III; ? COPD/asthma [Dr.Fleming]; Afib [on eliquis; Dr.Khan]         Thyroid cancer ([place])     HISTORY OF PRESENTING ILLNESS:  Brenda Barber 62 y.o.  female with no previous history of smoking; history of thyroid cancer stage I-has been referred to [place] for further evaluation recommendations for lung nodules.  Patient states that she was found to have bilateral lung nodules/groundglass opacities on imaging/incidental approximately 4 years ago.  Patient had been closely followed by Dr. Faith Rogue without any surgical intervention or biopsy.  However more recently on a scan in April 2019 showed more solid component to this nodule suspicious for bronchioloalveolar adenocarcinoma the lung.   Patient admits to chronic mild shortness of breath chronic mild cough.  No hemoptysis.  She is not on oxygen.    Denies any blood in stools black or stools.  Denies any bone pain.  No nausea no vomiting.   ROS: A complete 10 point review of system is done which is negative except mentioned above in history of present illness  MEDICAL HISTORY:  Past Medical History:  Diagnosis Date  . Allergy   . Anemia   . Asthma   . Atrial fibrillation ([place])   . Cerebrovascular accident [facility])    History of visual deficit  . Chiari I malformation ([place])   . Coronary  artery disease   . Depression   . Dyspnea   . GERD (gastroesophageal reflux disease)   . Hyperlipidemia   . Hypertension   . Hypoparathyroidism ([place])   . Hypothyroidism   . Personality disorder, depressive   . PONV (postoperative nausea and vomiting)    difficulty breathing during endoscopy, colonoscopy performed prior w/out problems  . Renal insufficiency   . Sleep apnea   . Thyroid cancer ([place]) 1999   Papillary  . Thyroid disease    hypothyroid     SURGICAL HISTORY: Past Surgical History:  Procedure Laterality Date  . APPENDECTOMY    . CANNOT TOLERATE PAP / Virginal    . CATARACT EXTRACTION, BILATERAL    . MRI brain  12/1999  . THYROIDECTOMY    . TONSILLECTOMY    . TOTAL HIP ARTHROPLASTY Right 06/26/2016   Procedure: TOTAL HIP ARTHROPLASTY ANTERIOR APPROACH;  Surgeon: Hessie Knows, MD;  Location: [facility] ORS;  Service: Orthopedics;  Laterality: Right;    SOCIAL HISTORY: [place]; diability- nrusing assiatnt; never smoked; no alcohol;  Social History   Socioeconomic History  . Marital status: Single    Spouse name: Not on file  . Number of children: Not on file  . Years of education: Not on file  . Highest education level: Not on file  Occupational History  . Occupation: Forensic psychologist  Social Needs  . Financial resource strain: Not on file  . Food insecurity:    Worry: Not on file    Inability: Not on file  . Transportation  needs:    Medical: Not on file    Non-medical: Not on file  Tobacco Use  . Smoking status: Never Smoker  . Smokeless tobacco: Never Used  Substance and Sexual Activity  . Alcohol use: No  . Drug use: No  . Sexual activity: Not on file  Lifestyle  . Physical activity:    Days per week: Not on file    Minutes per session: Not on file  . Stress: Not on file  Relationships  . Social connections:    Talks on phone: Not on file    Gets together: Not on file    Attends religious service: Not on file    Active member of club or  organization: Not on file    Attends meetings of clubs or organizations: Not on file    Relationship status: Not on file  . Intimate partner violence:    Fear of current or ex partner: Not on file    Emotionally abused: Not on file    Physically abused: Not on file    Forced sexual activity: Not on file  Other Topics Concern  . Not on file  Social History Narrative  . Not on file    FAMILY HISTORY: Family History  Problem Relation Age of Onset  . Diabetes Father   . Diabetes Paternal Aunt   . Diabetes Paternal Uncle   . Breast cancer Sister 17    ALLERGIES:  is allergic to aspirin-dipyridamole er and rosuvastatin.  MEDICATIONS:  Current Outpatient Medications  Medication Sig Dispense Refill  . acetaminophen (TYLENOL) 500 MG tablet Take 500 mg by mouth every 6 (six) hours as needed for mild pain.    Marland Kitchen ADVAIR DISKUS 250-50 MCG/DOSE AEPB Inhale 1 puff into the lungs daily.   1  . albuterol (PROVENTIL HFA;VENTOLIN HFA) 108 (90 BASE) MCG/ACT inhaler Inhale 2 puffs into the lungs every 6 (six) hours as needed for wheezing or shortness of breath.     Marland Kitchen amiodarone (PACERONE) 200 MG tablet Take 1 tablet (200 mg total) by mouth daily. 14 tablet 0  . amLODipine (NORVASC) 10 MG tablet Take by mouth.    Marland Kitchen apixaban (ELIQUIS) 5 MG TABS tablet Take by mouth.    Marland Kitchen aspirin EC 81 MG tablet Take 81 mg by mouth daily.    Marland Kitchen atenolol (TENORMIN) 50 MG tablet Take 50 mg by mouth daily.      Marland Kitchen atorvastatin (LIPITOR) 80 MG tablet Take 1 tablet by mouth at bedtime.     Marland Kitchen azelastine (ASTELIN) 0.1 % nasal spray Place 1 spray into both nostrils daily. Use in each nostril as directed    . beclomethasone (QVAR) 80 MCG/ACT inhaler Inhale 1 puff into the lungs 2 (two) times daily.    . calcitRIOL (ROCALTROL) 0.25 MCG capsule Take 0.25-0.5 mcg by mouth 2 (two) times daily. Take 2 capsules every morning and 1 capsule every evening.     . calcium carbonate (TUMS E-X 750) 750 MG chewable tablet Chew 2 tablets by  mouth daily.    . fluticasone (VERAMYST) 27.5 MCG/SPRAY nasal spray Place into the nose.    . iron polysaccharides (NIFEREX) 150 MG capsule TAKE 1 CAPSULE(150 MG) BY MOUTH EVERY DAY    . levothyroxine (SYNTHROID, LEVOTHROID) 150 MCG tablet Take 1 tablet by mouth daily before breakfast.     . lisinopril (PRINIVIL,ZESTRIL) 5 MG tablet Take 5 mg by mouth daily.    Marland Kitchen loratadine (CLARITIN) 10 MG tablet Take by mouth.    Marland Kitchen  metoprolol (LOPRESSOR) 50 MG tablet Take 50 mg by mouth 2 (two) times daily.     . mometasone (NASONEX) 50 MCG/ACT nasal spray Place 2 sprays into the nose daily as needed.      . montelukast (SINGULAIR) 10 MG tablet Take 10 mg by mouth at bedtime.    . Multiple Vitamin (MULTIVITAMIN) tablet Take 1 tablet by mouth daily.    . naproxen (NAPROSYN) 250 MG tablet Take by mouth.    . niacin 500 MG tablet Take 500 mg by mouth daily.    . nitrofurantoin, macrocrystal-monohydrate, (MACROBID) 100 MG capsule     . nitroGLYCERIN (NITROSTAT) 0.4 MG SL tablet Place 0.4 mg under the tongue every 5 (five) minutes as needed for chest pain.     . nortriptyline (PAMELOR) 10 MG capsule TAKE 1 TO 2 CAPSULES BY MOUTH AT BEDTIME    . omeprazole (PRILOSEC) 20 MG capsule TAKE 1 CAPSULE(20 MG) BY MOUTH TWICE DAILY    . pantoprazole (PROTONIX) 40 MG tablet Take 40 mg by mouth daily.    . traZODone (DESYREL) 50 MG tablet Take 50 mg by mouth at bedtime as needed for sleep.     . vitamin C (ASCORBIC ACID) 500 MG tablet Take 500 mg by mouth daily.     No current facility-administered medications for this visit.       Marland Kitchen  PHYSICAL EXAMINATION: ECOG PERFORMANCE STATUS: 0 - Asymptomatic  Vitals:   09/09/17 1119  BP: (!) 170/95  Pulse: 64  Resp: 16  Temp: 97.7 F (36.5 C)   Filed Weights   09/09/17 1119  Weight: 161 lb (73 kg)    GENERAL: Well-nourished well-developed; Alert, no distress and comfortable.   With friend.  EYES: no pallor or icterus OROPHARYNX: no thrush or ulceration; good  dentition  NECK: supple, no masses felt LYMPH:  no palpable lymphadenopathy in the cervical, axillary or inguinal regions LUNGS: clear to auscultation and  No wheeze or crackles HEART/CVS: regular rate & rhythm and no murmurs; No lower extremity edema ABDOMEN: abdomen soft, non-tender and normal bowel sounds Musculoskeletal:no cyanosis of digits and no clubbing  PSYCH: alert & oriented x 3 with fluent speech NEURO: no focal motor/sensory deficits SKIN:  no rashes or significant lesions  LABORATORY DATA:  I have reviewed the data as listed Lab Results  Component Value Date   WBC 8.0 09/09/2017   HGB 11.7 (L) 09/09/2017   HCT 34.7 (L) 09/09/2017   MCV 85.1 09/09/2017   PLT 222 09/09/2017   Recent Labs    09/09/17 1209  NA 143  K 3.2*  CL 106  CO2 26  GLUCOSE 87  BUN 21*  CREATININE 1.37*  CALCIUM 9.2  GFRNONAA 41*  GFRAA 47*  PROT 7.4  ALBUMIN 4.3  AST 30  ALT 33  ALKPHOS 103  BILITOT 0.7    RADIOGRAPHIC STUDIES: I have personally reviewed the radiological images as listed and agreed with the findings in the report. Ct Chest Wo Contrast  Result Date: 08/31/2017 CLINICAL DATA:  Followup indeterminate pulmonary nodules. EXAM: CT CHEST WITHOUT CONTRAST TECHNIQUE: Multidetector CT imaging of the chest was performed following the standard protocol without IV contrast. COMPARISON:  09/08/2015, and 06/23/2013 FINDINGS: Cardiovascular: No acute findings. Aortic and coronary artery atherosclerosis. Mediastinum/Nodes: No masses or pathologically enlarged lymph nodes identified on this unenhanced exam. Lungs/Pleura: Respiratory motion artifact noted mainly in the lung bases. Multiple ground-glass nodules are seen in both lungs. Several of these show mild increase in size and  density compared to previous studies dating back to 2015. These measure 16 mm in left lung apex on image 29/3 compared to 14 mm previously, 10 mm in anterior left upper lobe on image 43/3 compared to 6 mm  previously, 7 mm in the posterior left upper lobe on image 58/3 compared to 3 mm previously, 7 mm in the posterior right upper lobe on image 44/3 compared to 5 mm previously, and 8 mm in the medial right upper lobe on image 35/3 which is new. A 9 mm solid pulmonary nodule is seen in the left lower lobe on image 97/3 which measures 5 mm previously. These are suspicious for synchronous low-grade lung adenocarcinomas. No evidence of pleural effusion. Upper Abdomen:  Unremarkable. Musculoskeletal:  No suspicious bone lesions. IMPRESSION: Multiple bilateral ground-glass pulmonary nodules, several showing mild increase in size and density compared to previous studies dating back to 2015. A 9 mm solid nodule in left lower lobe has also increased in size. These findings are suspicious for synchronous low-grade lung adenocarcinomas. No evidence of lymphadenopathy or pleural effusion. Aortic Atherosclerosis (ICD10-I70.0). Coronary artery calcification. Electronically Signed   By: Earle Gell M.D.   On: 08/31/2017 16:03    ASSESSMENT & PLAN:   Multiple lung nodules # multiple lung nodules/groudn glass opacities [at least since 2015]-slightly progressive on the most recent CT scan April 2019.  Discussed the multiple etiologies of the lung nodules-including but not limited to slow-growing lung cancer /adenocarcinoma with a lipidic pattern; inflammatory versus infectious versus drug-induced [amiodarone] versus metastatic disease [thyroid cancer less likely].  Will review the imaging at the tumor conference; also discussed that only biopsy would confirm the the diagnosis.  Would await tumor conference recommendations.  # Anemia/CKD-stage III; hemoglobin around 10; check iron studies CBC ferritin.  Patient on p.o. iron.  # Thyroid cancer-2000 at [facility] status post radioactive iodine; unlikely recurrent disease because of her lung nodules.  Patient on TSH/followed by endocrine.  Check thyroglobulin levels;   # A.fib/eliquis  [Dr.Khan]- on amiodarone ~ 5 years.  Question because of lung nodules.  #In approximately 2-3 weeks patient will follow-up with me/reviewed labs; next plan of care.  Thank you Dr.Oaks for allowing me to participate in the care of your pleasant patient. Please do not hesitate to contact me with questions or concerns in the interim.  All questions were answered. The patient knows to call the clinic with any problems, questions or concerns.    Cammie Sickle, MD 09/09/2017 1:05 PM

## 2017-09-10 LAB — THYROGLOBULIN BY IMA

## 2017-09-10 LAB — TGAB+THYROGLOBULIN IMA OR RIA: Thyroglobulin Antibody: <1 IU/mL (ref 0.0–0.9)

## 2017-09-11 DIAGNOSIS — I10 Essential (primary) hypertension: Secondary | ICD-10-CM | POA: Diagnosis not present

## 2017-09-11 DIAGNOSIS — N183 Chronic kidney disease, stage 3 (moderate): Secondary | ICD-10-CM | POA: Diagnosis not present

## 2017-09-11 DIAGNOSIS — G4733 Obstructive sleep apnea (adult) (pediatric): Secondary | ICD-10-CM | POA: Diagnosis not present

## 2017-09-11 DIAGNOSIS — G935 Compression of brain: Secondary | ICD-10-CM | POA: Diagnosis not present

## 2017-09-11 DIAGNOSIS — R5383 Other fatigue: Secondary | ICD-10-CM | POA: Diagnosis not present

## 2017-09-11 DIAGNOSIS — E892 Postprocedural hypoparathyroidism: Secondary | ICD-10-CM | POA: Diagnosis not present

## 2017-09-11 DIAGNOSIS — R5381 Other malaise: Secondary | ICD-10-CM | POA: Diagnosis not present

## 2017-09-11 DIAGNOSIS — D649 Anemia, unspecified: Secondary | ICD-10-CM | POA: Diagnosis not present

## 2017-09-11 DIAGNOSIS — I480 Paroxysmal atrial fibrillation: Secondary | ICD-10-CM | POA: Diagnosis not present

## 2017-09-12 ENCOUNTER — Ambulatory Visit: Payer: Medicare HMO

## 2017-09-13 ENCOUNTER — Telehealth: Payer: Self-pay | Admitting: Internal Medicine

## 2017-09-13 NOTE — Telephone Encounter (Signed)
Brenda Barber-please inform patient that her case was reviewed to the tumor conference.  I will speak to Dr. Yancey Flemings her cardiology regarding amiodarone.  Follow-up as planned with me/will discuss in detail at the time. Thx

## 2017-09-13 NOTE — Telephone Encounter (Signed)
Pt has been made aware of MD recommendations for conference. Pt verbalized understanding.

## 2017-09-13 NOTE — Telephone Encounter (Signed)
Left a message for patient's cardiologist Dr. Humphrey Rolls to discuss regarding alternatives for amiodarone given groundglass opacities/lung nodule.

## 2017-09-18 DIAGNOSIS — Z85850 Personal history of malignant neoplasm of thyroid: Secondary | ICD-10-CM | POA: Diagnosis not present

## 2017-09-18 DIAGNOSIS — I480 Paroxysmal atrial fibrillation: Secondary | ICD-10-CM | POA: Diagnosis not present

## 2017-09-18 DIAGNOSIS — Z0000 Encounter for general adult medical examination without abnormal findings: Secondary | ICD-10-CM | POA: Diagnosis not present

## 2017-09-18 DIAGNOSIS — G935 Compression of brain: Secondary | ICD-10-CM | POA: Diagnosis not present

## 2017-09-18 DIAGNOSIS — E892 Postprocedural hypoparathyroidism: Secondary | ICD-10-CM | POA: Diagnosis not present

## 2017-09-18 DIAGNOSIS — G4733 Obstructive sleep apnea (adult) (pediatric): Secondary | ICD-10-CM | POA: Diagnosis not present

## 2017-09-18 DIAGNOSIS — N183 Chronic kidney disease, stage 3 (moderate): Secondary | ICD-10-CM | POA: Diagnosis not present

## 2017-09-18 DIAGNOSIS — R918 Other nonspecific abnormal finding of lung field: Secondary | ICD-10-CM | POA: Diagnosis not present

## 2017-09-18 DIAGNOSIS — L989 Disorder of the skin and subcutaneous tissue, unspecified: Secondary | ICD-10-CM | POA: Diagnosis not present

## 2017-09-19 DIAGNOSIS — I10 Essential (primary) hypertension: Secondary | ICD-10-CM | POA: Diagnosis not present

## 2017-09-19 DIAGNOSIS — I4891 Unspecified atrial fibrillation: Secondary | ICD-10-CM | POA: Diagnosis not present

## 2017-09-19 DIAGNOSIS — R0602 Shortness of breath: Secondary | ICD-10-CM | POA: Diagnosis not present

## 2017-09-19 DIAGNOSIS — E785 Hyperlipidemia, unspecified: Secondary | ICD-10-CM | POA: Diagnosis not present

## 2017-09-19 DIAGNOSIS — I2510 Atherosclerotic heart disease of native coronary artery without angina pectoris: Secondary | ICD-10-CM | POA: Diagnosis not present

## 2017-09-20 ENCOUNTER — Ambulatory Visit: Payer: Self-pay | Admitting: Cardiothoracic Surgery

## 2017-09-25 DIAGNOSIS — R51 Headache: Secondary | ICD-10-CM | POA: Diagnosis not present

## 2017-09-25 DIAGNOSIS — I4891 Unspecified atrial fibrillation: Secondary | ICD-10-CM | POA: Diagnosis not present

## 2017-09-25 DIAGNOSIS — R002 Palpitations: Secondary | ICD-10-CM | POA: Diagnosis not present

## 2017-09-27 DIAGNOSIS — I10 Essential (primary) hypertension: Secondary | ICD-10-CM | POA: Diagnosis not present

## 2017-09-27 DIAGNOSIS — R0602 Shortness of breath: Secondary | ICD-10-CM | POA: Diagnosis not present

## 2017-09-27 DIAGNOSIS — E785 Hyperlipidemia, unspecified: Secondary | ICD-10-CM | POA: Diagnosis not present

## 2017-09-27 DIAGNOSIS — I4891 Unspecified atrial fibrillation: Secondary | ICD-10-CM | POA: Diagnosis not present

## 2017-09-27 DIAGNOSIS — I2510 Atherosclerotic heart disease of native coronary artery without angina pectoris: Secondary | ICD-10-CM | POA: Diagnosis not present

## 2017-09-30 ENCOUNTER — Other Ambulatory Visit: Payer: Self-pay

## 2017-09-30 ENCOUNTER — Inpatient Hospital Stay: Payer: Medicare HMO | Attending: Internal Medicine | Admitting: Internal Medicine

## 2017-09-30 VITALS — BP 125/84 | HR 62 | Temp 97.9°F | Resp 20 | Ht 63.0 in | Wt 159.4 lb

## 2017-09-30 DIAGNOSIS — E892 Postprocedural hypoparathyroidism: Secondary | ICD-10-CM | POA: Diagnosis not present

## 2017-09-30 DIAGNOSIS — Z803 Family history of malignant neoplasm of breast: Secondary | ICD-10-CM

## 2017-09-30 DIAGNOSIS — K219 Gastro-esophageal reflux disease without esophagitis: Secondary | ICD-10-CM | POA: Diagnosis not present

## 2017-09-30 DIAGNOSIS — D631 Anemia in chronic kidney disease: Secondary | ICD-10-CM

## 2017-09-30 DIAGNOSIS — I129 Hypertensive chronic kidney disease with stage 1 through stage 4 chronic kidney disease, or unspecified chronic kidney disease: Secondary | ICD-10-CM | POA: Diagnosis not present

## 2017-09-30 DIAGNOSIS — N183 Chronic kidney disease, stage 3 (moderate): Secondary | ICD-10-CM | POA: Insufficient documentation

## 2017-09-30 DIAGNOSIS — Z85850 Personal history of malignant neoplasm of thyroid: Secondary | ICD-10-CM | POA: Diagnosis not present

## 2017-09-30 DIAGNOSIS — R918 Other nonspecific abnormal finding of lung field: Secondary | ICD-10-CM | POA: Diagnosis not present

## 2017-09-30 DIAGNOSIS — Z79899 Other long term (current) drug therapy: Secondary | ICD-10-CM | POA: Diagnosis not present

## 2017-09-30 DIAGNOSIS — E785 Hyperlipidemia, unspecified: Secondary | ICD-10-CM | POA: Diagnosis not present

## 2017-09-30 DIAGNOSIS — I4891 Unspecified atrial fibrillation: Secondary | ICD-10-CM | POA: Diagnosis not present

## 2017-09-30 DIAGNOSIS — E890 Postprocedural hypothyroidism: Secondary | ICD-10-CM | POA: Diagnosis not present

## 2017-09-30 DIAGNOSIS — Z7901 Long term (current) use of anticoagulants: Secondary | ICD-10-CM | POA: Diagnosis not present

## 2017-09-30 DIAGNOSIS — J449 Chronic obstructive pulmonary disease, unspecified: Secondary | ICD-10-CM

## 2017-09-30 DIAGNOSIS — Z8673 Personal history of transient ischemic attack (TIA), and cerebral infarction without residual deficits: Secondary | ICD-10-CM

## 2017-09-30 DIAGNOSIS — I2510 Atherosclerotic heart disease of native coronary artery without angina pectoris: Secondary | ICD-10-CM

## 2017-09-30 NOTE — Progress Notes (Signed)
[place] OFFICE PROGRESS NOTE  Patient Care Team: [name], MD as PCP - General (Internal Medicine) Telford Nab, RN as Registered Nurse  Cancer Staging No matching staging information was found for the patient.   Oncology History   # Lung nodules/GOO Multiple [id number]; Dr.Oaks].  # LLL nodule 8-59mm/ and bil GGO  [May 219- Off amiodarone; Dr.Khan;cards].   # Thyroid cancer [2000; [facility]] s/p RAI; post-surgical hypothyroidism/ post-surgical hypoparathyroidism.s/p  total thyroidectomy and was found to have a 6 cm follicular thyroid cancer without any local invasion. This was a [id number] follicular thyroid cancer, stage I. S/p radioiodine (115 mCi) and subsequently in 2005, 2006, and 2008 had serially negative thyrogen-stimulated whole body scans. f/u- Dr.Solum.   # CKD/ stage III; ? COPD/asthma [Dr.Fleming]; Afib [on eliquis; Dr.Khan]         Thyroid cancer ([place])      INTERVAL HISTORY:  Brenda Barber Person 62 y.o.  female pleasant patient above history of multiple lung nodules currently on surveillance of unclear etiology-is here for follow-up.  In the interim patient has been taken off her amiodarone by cardiologist.  Currently on Betapace.  Patient continues to have chronic mild shortness of breath chronic cough.  Denies any hemoptysis.  Denies any nausea vomiting.  Denies any headaches.  Review of Systems  Constitutional: Negative for chills, diaphoresis, fever, malaise/fatigue and weight loss.  HENT: Negative for nosebleeds and sore throat.   Eyes: Negative for double vision.  Respiratory: Positive for cough, sputum production and shortness of breath. Negative for hemoptysis and wheezing.   Cardiovascular: Negative for chest pain, palpitations, orthopnea and leg swelling.  Gastrointestinal: Negative for abdominal pain, blood in stool, constipation, diarrhea, heartburn, melena, nausea and vomiting.  Genitourinary: Negative for dysuria, frequency and urgency.   Musculoskeletal: Negative for back pain and joint pain.  Skin: Negative.  Negative for itching and rash.  Neurological: Negative for dizziness, tingling, focal weakness, weakness and headaches.  Endo/Heme/Allergies: Does not bruise/bleed easily.  Psychiatric/Behavioral: Negative for depression. The patient is not nervous/anxious and does not have insomnia.       PAST MEDICAL HISTORY :  Past Medical History:  Diagnosis Date  . Allergy   . Anemia   . Asthma   . Atrial fibrillation ([place])   . Cerebrovascular accident [facility])    History of visual deficit  . Chiari I malformation ([place])   . Coronary artery disease   . Depression   . Dyspnea   . GERD (gastroesophageal reflux disease)   . Hyperlipidemia   . Hypertension   . Hypoparathyroidism ([place])   . Hypothyroidism   . Personality disorder, depressive   . PONV (postoperative nausea and vomiting)    difficulty breathing during endoscopy, colonoscopy performed prior w/out problems  . Renal insufficiency   . Sleep apnea   . Thyroid cancer ([place]) 1999   Papillary  . Thyroid disease    hypothyroid     PAST SURGICAL HISTORY :   Past Surgical History:  Procedure Laterality Date  . APPENDECTOMY    . CANNOT TOLERATE PAP / Virginal    . CATARACT EXTRACTION, BILATERAL    . MRI brain  12/1999  . THYROIDECTOMY    . TONSILLECTOMY    . TOTAL HIP ARTHROPLASTY Right 06/26/2016   Procedure: TOTAL HIP ARTHROPLASTY ANTERIOR APPROACH;  Surgeon: Hessie Knows, MD;  Location: [facility] ORS;  Service: Orthopedics;  Laterality: Right;    FAMILY HISTORY :   Family History  Problem Relation Age of Onset  .  Diabetes Father   . Diabetes Paternal Aunt   . Diabetes Paternal Uncle   . Breast cancer Sister 37    SOCIAL HISTORY:   Social History   Tobacco Use  . Smoking status: Never Smoker  . Smokeless tobacco: Never Used  Substance Use Topics  . Alcohol use: No  . Drug use: No    ALLERGIES:  is allergic to aspirin-dipyridamole er and  rosuvastatin.  MEDICATIONS:  Current Outpatient Medications  Medication Sig Dispense Refill  . acetaminophen (TYLENOL) 500 MG tablet Take 500 mg by mouth every 6 (six) hours as needed for mild pain.    Marland Kitchen albuterol (PROVENTIL HFA;VENTOLIN HFA) 108 (90 BASE) MCG/ACT inhaler Inhale 2 puffs into the lungs every 6 (six) hours as needed for wheezing or shortness of breath.     Marland Kitchen apixaban (ELIQUIS) 5 MG TABS tablet Take 5 mg by mouth 2 (two) times daily.     Marland Kitchen atorvastatin (LIPITOR) 80 MG tablet Take 1 tablet by mouth at bedtime.     . calcitRIOL (ROCALTROL) 0.25 MCG capsule Take 0.25-0.5 mcg by mouth 2 (two) times daily. Take 2 capsules every morning and 1 capsule every evening.     . calcium carbonate (TUMS E-X 750) 750 MG chewable tablet Chew 2 tablets by mouth daily.    . iron polysaccharides (NIFEREX) 150 MG capsule TAKE 1 CAPSULE(150 MG) BY MOUTH EVERY DAY    . levothyroxine (SYNTHROID, LEVOTHROID) 137 MCG tablet Take 1 tablet by mouth daily before breakfast.     . lisinopril (PRINIVIL,ZESTRIL) 5 MG tablet Take 5 mg by mouth daily.    . metoprolol (LOPRESSOR) 50 MG tablet Take 50 mg by mouth 2 (two) times daily.     . mometasone (NASONEX) 50 MCG/ACT nasal spray Place 2 sprays into the nose daily as needed.      . montelukast (SINGULAIR) 10 MG tablet Take 10 mg by mouth at bedtime.    . Multiple Vitamin (MULTIVITAMIN) tablet Take 1 tablet by mouth daily.    . niacin 500 MG tablet Take 500 mg by mouth daily.    . nortriptyline (PAMELOR) 10 MG capsule TAKE 1 TO 2 CAPSULES BY MOUTH AT BEDTIME as needed for migraines    . sotalol (BETAPACE) 80 MG tablet Take 1 tablet by mouth daily.    . vitamin C (ASCORBIC ACID) 500 MG tablet Take 500 mg by mouth daily.    . nitroGLYCERIN (NITROSTAT) 0.4 MG SL tablet Place 0.4 mg under the tongue every 5 (five) minutes as needed for chest pain.     . pantoprazole (PROTONIX) 40 MG tablet Take 40 mg by mouth daily.    . traZODone (DESYREL) 50 MG tablet Take 50 mg by  mouth at bedtime as needed for sleep.      No current facility-administered medications for this visit.     PHYSICAL EXAMINATION: ECOG PERFORMANCE STATUS: 1 - Symptomatic but completely ambulatory  BP 125/84 (BP Location: Right Arm, Patient Position: Sitting)   Pulse 62   Temp 97.9 F (36.6 C) (Tympanic)   Resp 20   Ht 5\' 3"  (1.6 m)   Wt 159 lb 6.4 oz (72.3 kg)   BMI 28.24 kg/m   Filed Weights   09/30/17 1013  Weight: 159 lb 6.4 oz (72.3 kg)    GENERAL: Well-nourished well-developed; Alert, no distress and comfortable.  She is alone. EYES: no pallor or icterus OROPHARYNX: no thrush or ulceration; NECK: supple; no lymph nodes felt. LYMPH:  no palpable lymphadenopathy  in the axillary or inguinal regions LUNGS: Decreased breath sounds auscultation bilaterally. No wheeze or crackles HEART/CVS: regular rate & rhythm and no murmurs; No lower extremity edema ABDOMEN:abdomen soft, non-tender and normal bowel sounds. No hepatomegaly or splenomegaly.  Musculoskeletal:no cyanosis of digits and no clubbing  PSYCH: alert & oriented x 3 with fluent speech NEURO: no focal motor/sensory deficits SKIN:  no rashes or significant lesions    LABORATORY DATA:  I have reviewed the data as listed    Component Value Date/Time   NA 143 09/09/2017 1209   NA 139 09/03/2013 1947   K 3.2 (L) 09/09/2017 1209   K 3.7 09/03/2013 1947   CL 106 09/09/2017 1209   CL 104 09/03/2013 1947   CO2 26 09/09/2017 1209   CO2 28 09/03/2013 1947   GLUCOSE 87 09/09/2017 1209   GLUCOSE 123 (H) 09/03/2013 1947   BUN 21 (H) 09/09/2017 1209   BUN 22 (H) 09/03/2013 1947   CREATININE 1.37 (H) 09/09/2017 1209   CREATININE 0.96 09/03/2013 1947   CALCIUM 9.2 09/09/2017 1209   CALCIUM 7.7 (L) 09/03/2013 1947   CALCIUM 8.3 (L) 12/19/2009 0000   PROT 7.4 09/09/2017 1209   PROT 7.4 09/03/2013 1947   ALBUMIN 4.3 09/09/2017 1209   ALBUMIN 3.6 09/03/2013 1947   AST 30 09/09/2017 1209   AST 19 09/03/2013 1947   ALT  33 09/09/2017 1209   ALT 22 09/03/2013 1947   ALKPHOS 103 09/09/2017 1209   ALKPHOS 103 09/03/2013 1947   BILITOT 0.7 09/09/2017 1209   BILITOT 0.5 09/03/2013 1947   GFRNONAA 41 (L) 09/09/2017 1209   GFRNONAA >60 09/03/2013 1947   GFRAA 47 (L) 09/09/2017 1209   GFRAA >60 09/03/2013 1947    No results found for: SPEP, UPEP  Lab Results  Component Value Date   WBC 8.0 09/09/2017   NEUTROABS 5.5 09/09/2017   HGB 11.7 (L) 09/09/2017   HCT 34.7 (L) 09/09/2017   MCV 85.1 09/09/2017   PLT 222 09/09/2017      Chemistry      Component Value Date/Time   NA 143 09/09/2017 1209   NA 139 09/03/2013 1947   K 3.2 (L) 09/09/2017 1209   K 3.7 09/03/2013 1947   CL 106 09/09/2017 1209   CL 104 09/03/2013 1947   CO2 26 09/09/2017 1209   CO2 28 09/03/2013 1947   BUN 21 (H) 09/09/2017 1209   BUN 22 (H) 09/03/2013 1947   CREATININE 1.37 (H) 09/09/2017 1209   CREATININE 0.96 09/03/2013 1947      Component Value Date/Time   CALCIUM 9.2 09/09/2017 1209   CALCIUM 7.7 (L) 09/03/2013 1947   CALCIUM 8.3 (L) 12/19/2009 0000   ALKPHOS 103 09/09/2017 1209   ALKPHOS 103 09/03/2013 1947   AST 30 09/09/2017 1209   AST 19 09/03/2013 1947   ALT 33 09/09/2017 1209   ALT 22 09/03/2013 1947   BILITOT 0.7 09/09/2017 1209   BILITOT 0.5 09/03/2013 1947       RADIOGRAPHIC STUDIES: I have personally reviewed the radiological images as listed and agreed with the findings in the report. No results found.   ASSESSMENT & PLAN:  Multiple lung nodules # multiple lung nodules/groudn glass opacities [at least since 2015]-slightly progressive on the most recent CT scan April 2019; and LLL nodule ~ 8-3mm  # Recommend follow up CT scan in 6 months; to evaluate the nodules-especially the left lower lobe lung nodule [which seems to be more suspicious for malignancy than  others]-groundglass opacities-differential includes low-grade malignancy like lipidic adenocarcinoma versus drug reaction [amiodarone-less  likely see discussion below]  # Anemia/CKD-stage III; hemoglobin around 11.improved; Iron sat- 10%  Continue   Patient on p.o. Iron.   # Thyroid cancer-s/p RAIU [2000]- stable/ no recurrence.Tumor makers- Normal.  Do not suspect lung nodules related to thyroid cancer.  # A.fib/eliquis [Dr.Khan]- on amiodarone ~ 5 years.  Discussed with Dr.Khan; off amiodarone [may 2019].   # follow up with me in 6 months/labs;CT prior.  Patient's case was discussed in the tumor conference.  Cc; Dr.Oaks   Orders Placed This Encounter  Procedures  . CT CHEST WO CONTRAST    Standing Status:   Future    Standing Expiration Date:   10/01/2018    Order Specific Question:   Preferred imaging location?    Answer:   [facility]    Order Specific Question:   Radiology Contrast Protocol - do NOT remove file path    Answer:   [email or web address]  . CBC with Differential    Standing Status:   Future    Standing Expiration Date:   10/01/2018  . Basic metabolic panel    Standing Status:   Future    Standing Expiration Date:   10/01/2018   All questions were answered. The patient knows to call the clinic with any problems, questions or concerns.      Cammie Sickle, MD 10/01/2017 10:22 AM

## 2017-09-30 NOTE — Assessment & Plan Note (Addendum)
#   multiple lung nodules/groudn glass opacities [at least since 2015]-slightly progressive on the most recent CT scan April 2019; and LLL nodule ~ 8-5mm  # Recommend follow up CT scan in 6 months; to evaluate the nodules-especially the left lower lobe lung nodule [which seems to be more suspicious for malignancy than others]-groundglass opacities-differential includes low-grade malignancy like lipidic adenocarcinoma versus drug reaction [amiodarone-less likely see discussion below]  # Anemia/CKD-stage III; hemoglobin around 11.improved; Iron sat- 10%  Continue   Patient on p.o. Iron.   # Thyroid cancer-s/p RAIU [2000]- stable/ no recurrence.Tumor makers- Normal.  Do not suspect lung nodules related to thyroid cancer.  # A.fib/eliquis [Dr.Khan]- on amiodarone ~ 5 years.  Discussed with Dr.Khan; off amiodarone [may 2019].   # follow up with me in 6 months/labs;CT prior.  Patient's case was discussed in the tumor conference.   # 25 minutes face-to-face with the patient discussing the above plan of care; more than 50% of time spent on prognosis/ natural history; counseling and coordination.  Cc; Dr.Oaks

## 2017-10-03 ENCOUNTER — Other Ambulatory Visit: Payer: Self-pay | Admitting: Internal Medicine

## 2017-10-03 DIAGNOSIS — I4891 Unspecified atrial fibrillation: Secondary | ICD-10-CM | POA: Diagnosis not present

## 2017-10-03 DIAGNOSIS — R002 Palpitations: Secondary | ICD-10-CM | POA: Diagnosis not present

## 2017-10-03 DIAGNOSIS — R0602 Shortness of breath: Secondary | ICD-10-CM | POA: Diagnosis not present

## 2017-10-03 DIAGNOSIS — E785 Hyperlipidemia, unspecified: Secondary | ICD-10-CM | POA: Diagnosis not present

## 2017-10-03 DIAGNOSIS — I10 Essential (primary) hypertension: Secondary | ICD-10-CM | POA: Diagnosis not present

## 2017-10-03 DIAGNOSIS — Z1231 Encounter for screening mammogram for malignant neoplasm of breast: Secondary | ICD-10-CM

## 2017-10-03 DIAGNOSIS — I2510 Atherosclerotic heart disease of native coronary artery without angina pectoris: Secondary | ICD-10-CM | POA: Diagnosis not present

## 2017-10-07 DIAGNOSIS — R399 Unspecified symptoms and signs involving the genitourinary system: Secondary | ICD-10-CM | POA: Diagnosis not present

## 2017-10-24 ENCOUNTER — Ambulatory Visit
Admission: RE | Admit: 2017-10-24 | Discharge: 2017-10-24 | Disposition: A | Discharge status: Home / Self Care | Payer: Medicare HMO | Source: Ambulatory Visit | Attending: Internal Medicine | Admitting: Internal Medicine

## 2017-10-24 DIAGNOSIS — Z1231 Encounter for screening mammogram for malignant neoplasm of breast: Secondary | ICD-10-CM | POA: Diagnosis not present

## 2017-10-25 ENCOUNTER — Emergency Department
Admission: EM | Admit: 2017-10-25 | Discharge: 2017-10-26 | Disposition: A | Discharge status: Home / Self Care | Payer: Medicare HMO | Attending: Emergency Medicine | Admitting: Emergency Medicine

## 2017-10-25 ENCOUNTER — Encounter: Payer: Self-pay | Admitting: Emergency Medicine

## 2017-10-25 ENCOUNTER — Emergency Department: Payer: Medicare HMO

## 2017-10-25 DIAGNOSIS — I2510 Atherosclerotic heart disease of native coronary artery without angina pectoris: Secondary | ICD-10-CM | POA: Diagnosis not present

## 2017-10-25 DIAGNOSIS — I480 Paroxysmal atrial fibrillation: Secondary | ICD-10-CM | POA: Diagnosis not present

## 2017-10-25 DIAGNOSIS — J45909 Unspecified asthma, uncomplicated: Secondary | ICD-10-CM | POA: Diagnosis not present

## 2017-10-25 DIAGNOSIS — E039 Hypothyroidism, unspecified: Secondary | ICD-10-CM | POA: Diagnosis not present

## 2017-10-25 DIAGNOSIS — R079 Chest pain, unspecified: Secondary | ICD-10-CM | POA: Diagnosis not present

## 2017-10-25 DIAGNOSIS — R0789 Other chest pain: Secondary | ICD-10-CM | POA: Diagnosis not present

## 2017-10-25 DIAGNOSIS — Z7901 Long term (current) use of anticoagulants: Secondary | ICD-10-CM | POA: Insufficient documentation

## 2017-10-25 DIAGNOSIS — I129 Hypertensive chronic kidney disease with stage 1 through stage 4 chronic kidney disease, or unspecified chronic kidney disease: Secondary | ICD-10-CM | POA: Insufficient documentation

## 2017-10-25 DIAGNOSIS — Z8673 Personal history of transient ischemic attack (TIA), and cerebral infarction without residual deficits: Secondary | ICD-10-CM | POA: Insufficient documentation

## 2017-10-25 DIAGNOSIS — Z96641 Presence of right artificial hip joint: Secondary | ICD-10-CM | POA: Diagnosis not present

## 2017-10-25 DIAGNOSIS — N183 Chronic kidney disease, stage 3 (moderate): Secondary | ICD-10-CM | POA: Diagnosis not present

## 2017-10-25 DIAGNOSIS — Z85850 Personal history of malignant neoplasm of thyroid: Secondary | ICD-10-CM | POA: Diagnosis not present

## 2017-10-25 DIAGNOSIS — Z79899 Other long term (current) drug therapy: Secondary | ICD-10-CM | POA: Diagnosis not present

## 2017-10-25 LAB — CBC
HCT: 34.6 % — ABNORMAL LOW (ref 35.0–47.0)
Hemoglobin: 11.4 g/dL — ABNORMAL LOW (ref 12.0–16.0)
MCH: 28.3 pg (ref 26.0–34.0)
MCHC: 33 g/dL (ref 32.0–36.0)
MCV: 85.9 fL (ref 80.0–100.0)
PLATELETS: 242 10*3/uL (ref 150–440)
RBC: 4.03 MIL/uL (ref 3.80–5.20)
RDW: 16.5 % — AB (ref 11.5–14.5)
WBC: 9.6 10*3/uL (ref 3.6–11.0)

## 2017-10-25 LAB — BASIC METABOLIC PANEL
Anion gap: 12 (ref 5–15)
BUN: 25 mg/dL — ABNORMAL HIGH (ref 6–20)
CALCIUM: 9.2 mg/dL (ref 8.9–10.3)
CO2: 25 mmol/L (ref 22–32)
CREATININE: 1.4 mg/dL — AB (ref 0.44–1.00)
Chloride: 104 mmol/L (ref 101–111)
GFR calc non Af Amer: 40 mL/min — ABNORMAL LOW (ref >60)
GFR, EST AFRICAN AMERICAN: 46 mL/min — AB (ref >60)
Glucose, Bld: 106 mg/dL — ABNORMAL HIGH (ref 65–99)
Potassium: 3.1 mmol/L — ABNORMAL LOW (ref 3.5–5.1)
Sodium: 141 mmol/L (ref 135–145)

## 2017-10-25 LAB — TROPONIN I

## 2017-10-25 MED ORDER — POTASSIUM CHLORIDE CRYS ER 20 MEQ PO TBCR
40.0000 meq | EXTENDED_RELEASE_TABLET | Freq: Once | ORAL | Status: AC
  Administered 2017-10-26: 40 meq via ORAL

## 2017-10-25 NOTE — ED Triage Notes (Signed)
Patient states that her cardiologist changed her heart medication about a month ago. Patient states that she has had constant central chest pain that radiates to her back since. Patient states that the pain became worse today. Patient states that she took 2 nitro without any relief. Patient denies shortness of breath or nausea.

## 2017-10-25 NOTE — ED Provider Notes (Signed)
[facility] Emergency Department Provider Note  ____________________________________________   First MD Initiated Contact with Patient 10/25/17 2337     (approximate)  I have reviewed the triage vital signs and the nursing notes.   HISTORY  Chief Complaint Chest Pain  History is somewhat limited by the patient being a vague historian and seems unable to provide very much detail about her history of present illness or past medical history.  HPI Brenda Barber is a 62 y.o. female with extensive chronic medical history that includes but is not limited to paroxysmal atrial fibrillation on Eliquis who sees Dr. Laurelyn Sickle for cardiology.  She presents tonight by private vehicle for evaluation of persistent central chest pain that frequently radiates to her back that has been going on for about a month.  She reports that she was taken off of amlodipine and started on sotalol a month ago when she noticed the chest pain shortly after that change was made.  After she reports that the pain was constant, never going away, but then she later said that may be a goes away for a while and then comes back.  She describes it as both an aching and a sharp pain that goes from her chest to her back and nothing in particular makes it better or worse including eating, drinking, activity, exertion, etc.  She reports that it has been worse over the last day which is what caused her to come into the emergency department tonight.  In the past she has had some chest pain that feels similar but nitroglycerin makes it go away, but she took 2 nitroglycerin at night and it did not change her symptoms.  She denies fever/chills, shortness of breath, nausea, vomiting, abdominal pain, leg pain, leg swelling, and dysuria.  During the month that her pain has been going on, she has not called her cardiologist to discuss the symptoms but she has an appointment in over a month to follow-up.  She reports that she has  been compliant with all of her medications including her Eliquis.  Past Medical History:  Diagnosis Date  . Allergy   . Anemia   . Asthma   . Atrial fibrillation ([place])   . Cerebrovascular accident [facility])    History of visual deficit  . Chiari I malformation ([place])   . Coronary artery disease   . Depression   . Dyspnea   . GERD (gastroesophageal reflux disease)   . Hyperlipidemia   . Hypertension   . Hypoparathyroidism ([place])   . Hypothyroidism   . Personality disorder, depressive   . PONV (postoperative nausea and vomiting)    difficulty breathing during endoscopy, colonoscopy performed prior w/out problems  . Renal insufficiency   . Sleep apnea   . Thyroid cancer ([place]) 1999   Papillary  . Thyroid disease    hypothyroid     Patient Active Problem List   Diagnosis Date Noted  . Thyroid cancer ([place]) 09/09/2017  . CKD (chronic kidney disease) stage 3, GFR 30-59 ml/min (HCC) 01/16/2017  . Primary localized osteoarthritis of right hip 06/26/2016  . Anemia, unspecified 03/02/2016  . Ileus ([place])   . Emesis   . Abdominal pain 01/13/2016  . Chiari I malformation ([place]) 06/01/2015  . Paroxysmal A-fib ([place]) 06/01/2015  . Excessive falling 04/21/2015  . Repeated falls 04/21/2015  . Seizure ([place]) 03/16/2015  . Transient alteration of awareness 03/16/2015  . Postsurgical hypoparathyroidism ([place]) 10/31/2014  . History of thyroid cancer 04/26/2014  .  Post-surgical hypothyroidism 04/26/2014  . Sleep apnea, obstructive 04/19/2014  . Multiple lung nodules 11/09/2013  . Personality disorder, depressive 05/08/2011  . GERD 12/23/2009  . HYPERGLYCEMIA 12/23/2009  . 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 GASTROENTERITIS, VIRAL 08/01/2007  . Hypothyroidism 05/23/2007  . HYPOPARATHYROIDISM 08/12/2006  . HYPERLIPIDEMIA 08/12/2006  . CORNEAL DISORDER 08/12/2006  . Benign essential hypertension 08/12/2006  . ALLERGIC RHINITIS 08/12/2006  . REACTIVE AIRWAY DISEASE 08/12/2006  . POSTMENOPAUSAL STATUS  08/12/2006  . ROSACEA 08/12/2006  . COUGH, CHRONIC 08/12/2006  . CEREBROVASCULAR ACCIDENT, HX OF 08/12/2006  . MIGRAINES, HX OF 08/12/2006    Past Surgical History:  Procedure Laterality Date  . APPENDECTOMY    . CANNOT TOLERATE PAP / Virginal    . CATARACT EXTRACTION, BILATERAL    . MRI brain  12/1999  . THYROIDECTOMY    . TONSILLECTOMY    . TOTAL HIP ARTHROPLASTY Right 06/26/2016   Procedure: TOTAL HIP ARTHROPLASTY ANTERIOR APPROACH;  Surgeon: Hessie Knows, MD;  Location: [facility] ORS;  Service: Orthopedics;  Laterality: Right;    Prior to Admission medications   Medication Sig Start Date End Date Taking? Authorizing Provider  acetaminophen (TYLENOL) 500 MG tablet Take 500 mg by mouth every 6 (six) hours as needed for mild pain.    [provider]  albuterol (PROVENTIL HFA;VENTOLIN HFA) 108 (90 BASE) MCG/ACT inhaler Inhale 2 puffs into the lungs every 6 (six) hours as needed for wheezing or shortness of breath.     [provider]  apixaban (ELIQUIS) 5 MG TABS tablet Take 5 mg by mouth 2 (two) times daily.     [provider]  atorvastatin (LIPITOR) 80 MG tablet Take 1 tablet by mouth at bedtime.  06/12/15   [provider]  calcitRIOL (ROCALTROL) 0.25 MCG capsule Take 0.25-0.5 mcg by mouth 2 (two) times daily. Take 2 capsules every morning and 1 capsule every evening.     [provider]  calcium carbonate (TUMS E-X 750) 750 MG chewable tablet Chew 2 tablets by mouth daily.    [provider]  iron polysaccharides (NIFEREX) 150 MG capsule TAKE 1 CAPSULE(150 MG) BY MOUTH EVERY DAY 08/26/17   [provider]  levothyroxine (SYNTHROID, LEVOTHROID) 137 MCG tablet Take 1 tablet by mouth daily before breakfast.  09/04/15   [provider]  lisinopril (PRINIVIL,ZESTRIL) 5 MG tablet Take 5 mg by mouth daily.    [provider]  metoprolol (LOPRESSOR) 50 MG tablet Take 50 mg by mouth 2 (two) times daily.  05/30/15    [provider]  mometasone (NASONEX) 50 MCG/ACT nasal spray Place 2 sprays into the nose daily as needed.      [provider]  montelukast (SINGULAIR) 10 MG tablet Take 10 mg by mouth at bedtime.    [provider]  Multiple Vitamin (MULTIVITAMIN) tablet Take 1 tablet by mouth daily.    [provider]  niacin 500 MG tablet Take 500 mg by mouth daily.    [provider]  nitroGLYCERIN (NITROSTAT) 0.4 MG SL tablet Place 0.4 mg under the tongue every 5 (five) minutes as needed for chest pain.     [provider]  nortriptyline (PAMELOR) 10 MG capsule TAKE 1 TO 2 CAPSULES BY MOUTH AT BEDTIME as needed for migraines 02/06/17   [provider]  pantoprazole (PROTONIX) 40 MG tablet Take 40 mg by mouth daily.    [provider]  sotalol (BETAPACE) 80 MG tablet Take 1 tablet by mouth daily. 09/20/17  [provider]  traZODone (DESYREL) 50 MG tablet Take 50 mg by mouth at bedtime as needed for sleep.     [provider]  vitamin C (ASCORBIC ACID) 500 MG tablet Take 500 mg by mouth daily.    [provider]    Allergies Aspirin-dipyridamole er and Rosuvastatin  Family History  Problem Relation Age of Onset  . Diabetes Father   . Diabetes Paternal Aunt   . Diabetes Paternal Uncle   . Breast cancer Sister 52    Social History Social History   Tobacco Use  . Smoking status: Never Smoker  . Smokeless tobacco: Never Used  Substance Use Topics  . Alcohol use: No  . Drug use: No    Review of Systems Constitutional: No fever/chills Eyes: No visual changes. ENT: No sore throat. Cardiovascular: Central chest pain radiating to the back as described above. Respiratory: Denies shortness of breath. Gastrointestinal: No abdominal pain.  No nausea, no vomiting.  No diarrhea.  No constipation. Genitourinary: Negative for dysuria. Musculoskeletal: Negative for neck pain.  Negative for back  pain. Integumentary: Negative for rash. Neurological: Negative for headaches, focal weakness or numbness.   ____________________________________________   PHYSICAL EXAM:  VITAL SIGNS: ED Triage Vitals  Enc Vitals Group     BP 10/25/17 2001 (!) 164/79     Pulse Rate 10/25/17 2001 72     Resp 10/25/17 2001 18     Temp 10/25/17 2001 98.4 F (36.9 C)     Temp Source 10/25/17 2001 Oral     SpO2 10/25/17 2001 98 %     Weight 10/25/17 2004 72.1 kg (159 lb)     Height 10/25/17 2004 1.6 m (5\' 3" )     Head Circumference --      Peak Flow --      Pain Score 10/25/17 2003 5     Pain Loc --      Pain Edu? --      Excl. in [place]? --     Constitutional: Alert and oriented. Well appearing and in no acute distress. Eyes: Conjunctivae are normal.  Head: Atraumatic. Nose: No congestion/rhinnorhea. Mouth/Throat: Mucous membranes are moist. Neck: No stridor.  No meningeal signs.   Cardiovascular: Normal rate, regular rhythm. Good peripheral circulation. Grossly normal heart sounds.  No reproducible anterior chest wall pain. Respiratory: Normal respiratory effort.  No retractions. Lungs CTAB. Gastrointestinal: Soft and nontender. No distention.  Musculoskeletal: No lower extremity tenderness nor edema. No gross deformities of extremities. Neurologic:  Normal speech and language. No gross focal neurologic deficits are appreciated.  Skin:  Skin is warm, dry and intact. No rash noted. Psychiatric: Mood and affect are somewhat flat and blunted.  ____________________________________________   LABS (all labs ordered are listed, but only abnormal results are displayed)  Labs Reviewed  BASIC METABOLIC PANEL - Abnormal; Notable for the following components:      Result Value   Potassium 3.1 (*)    Glucose, Bld 106 (*)    BUN 25 (*)    Creatinine, Ser 1.40 (*)    GFR calc non Af Amer 40 (*)    GFR calc Af Amer 46 (*)    All other components within normal limits  CBC - Abnormal; Notable for  the following components:   Hemoglobin 11.4 (*)    HCT 34.6 (*)    RDW 16.5 (*)    All other components within normal limits  FIBRIN DERIVATIVES D-DIMER ([facility] ONLY) - Abnormal; Notable for the following  components:   Fibrin derivatives D-dimer [facility]) 643.32 (*)    All other components within normal limits  TROPONIN I   ____________________________________________  EKG  ED ECG REPORT I, Hinda Kehr, the attending physician, personally viewed and interpreted this ECG.  Date: 10/25/2017 EKG Time: 19: 59 Rate: 79 Rhythm: normal sinus rhythm with rare PVC QRS Axis: normal Intervals: normal ST/T Wave abnormalities: Non-specific ST segment / T-wave changes, but no evidence of acute ischemia. Narrative Interpretation: no evidence of acute ischemia   ____________________________________________  RADIOLOGY I, Hinda Kehr, personally viewed and evaluated these images (plain radiographs) as part of my medical decision making, as well as reviewing the written report by the radiologist.  ED MD interpretation:  No acute abnormalities  Official radiology report(s): Dg Chest 2 View  Result Date: 10/25/2017 CLINICAL DATA:  Central chest pain radiating to back 1 month worsening today. Two nitroglycerin without relief. EXAM: CHEST - 2 VIEW COMPARISON:  01/15/2016 FINDINGS: Lungs are adequately inflated without focal lobar consolidation or effusion. Cardiomediastinal silhouette and remainder of the exam is unchanged. IMPRESSION: No active cardiopulmonary disease. Electronically Signed   By: Marin Olp M.D.   On: 10/25/2017 20:36   Ct Angio Chest Pe W/cm &/or Wo Cm  Result Date: 10/26/2017 CLINICAL DATA:  Acute onset of central chest pain, radiating to the back. EXAM: CT ANGIOGRAPHY CHEST WITH CONTRAST TECHNIQUE: Multidetector CT imaging of the chest was performed using the standard protocol during bolus administration of intravenous contrast. Multiplanar CT image reconstructions and MIPs were  obtained to evaluate the vascular anatomy. CONTRAST:  17mL ISOVUE-370 IOPAMIDOL (ISOVUE-370) INJECTION 76% COMPARISON:  Chest radiograph performed 10/25/2017, and CT of the chest performed 08/30/2017 FINDINGS: Cardiovascular:  There is no evidence of pulmonary embolus. The heart is normal in size. Minimal calcification is noted at the aortic arch. The great vessels are grossly unremarkable in appearance. Mediastinum/Nodes: The mediastinum is unremarkable in appearance. No mediastinal lymphadenopathy is seen. No pericardial effusion is identified. The patient is status post thyroidectomy. No axillary lymphadenopathy is seen. Lungs/Pleura: Mild bibasilar atelectasis is noted. No pleural effusion or pneumothorax is seen. Scattered bilateral ground-glass nodules are noted bilaterally, measuring up to 1.3 cm in size at the left lung apex. These are similar to the recent prior CT. No pleural effusion or pneumothorax is seen. Upper Abdomen: The visualized portions of the liver and spleen are unremarkable. The visualized portions of the gallbladder, pancreas and adrenal glands are within normal limits. Musculoskeletal: No acute osseous abnormalities are identified. The visualized musculature is unremarkable in appearance. Review of the MIP images confirms the above findings. IMPRESSION: 1. No evidence of pulmonary embolus. 2. Scattered bilateral ground-glass nodules bilaterally, measuring up to 1.3 cm in size at the left lung apex. These are similar to the recent prior CT, though increased in size from prior studies. As before, this is suspicious for multiple synchronous low-grade lung adenocarcinomas. 3. Mild bibasilar atelectasis noted. Electronically Signed   By: Garald Balding M.D.   On: 10/26/2017 02:44    ____________________________________________   PROCEDURES  Critical Care performed: No   Procedure(s) performed:   Procedures   ____________________________________________   INITIAL IMPRESSION /  ASSESSMENT AND PLAN / ED COURSE  As part of my medical decision making, I reviewed the following data within the [place] notes reviewed and incorporated, Labs reviewed , Old chart reviewed, Radiograph reviewed  and Notes from prior ED visits    Differential diagnosis includes, but is not limited to, angina/ACS,  PE, pneumonia, aortic dissection, pneumothorax.  The fact that the patient has been having symptoms for a month is somewhat reassuring and that it is less likely an acute or emergent condition tonight.  Her vital signs are notable for hypertension but otherwise normal with no evidence of tachycardia or hypoxemia.  Her lab work is within normal limits for her with a negative troponin, normal CBC, and stable creatinine of 1.4 with a GFR of about 40.  This is unchanged from prior from a couple of months ago and is her baseline.  My biggest concern for her at this point would be a subacute pulmonary embolism given her history of paroxysmal atrial fibrillation.  If she is not taking her Eliquis or if it is not therapeutic, she could have a persistent PE causing her symptoms of central chest pain rating to the back that is been more or less constant for a month.  I would anticipate possibly seeing some changes on her chest x-ray if this is a chronic condition but her chest x-ray seems clear at this time but it would be difficult to evaluate if it is relatively central without any areas of acute ischemia.  I am reluctant to order a CTA chest given her chronic kidney disease.  I will try to rule her out with an age-related d-dimer.  If her d-dimer is 610 or less I think given her low probability of pulmonary embolism based on her HPI and anticoagulation on Eliquis, we should be able to avoid a scan.  However if her d-dimer is elevated above this limit I think that a scan would be appropriate and would also help rule out aortic dissection or other acute intrathoracic  abnormalities.  I am also rechecking a second troponin but I think that is less likely going to be helpful given the duration of symptoms she has had in the fact she Artie has a negative troponin.  Have already talked to the patient about discharge with outpatient follow-up with Dr. Humphrey Rolls if her work-up is reassuring today and she understands and agrees with the plan.  Clinical Course as of Oct 26 417  Sat Oct 26, 2017  0104 Treated with potassium 40 meq PO.  Potassium(!): 3.1 [CF]  0125 D-dimer slightly elevated over age-adjusted cutoff.  Will proceed with CTA chest, but with 80% contrast dosing as per protocol.  Fibrin derivatives D-dimer [facility])(!): 643.32 [CF]  0315 CTA reassuring.  Discussed results with the patient.  She was already aware of the "spots" in her lungs.  Is reassured by the workup today, will follow up as outpatient. No indication for repeat troponin given duration of symptoms.   I gave my usual and customary return precautions.    [CF]    Clinical Course User Index [CF] Hinda Kehr, MD    ____________________________________________  FINAL CLINICAL IMPRESSION(S) / ED DIAGNOSES  Final diagnoses:  Atypical chest pain     MEDICATIONS GIVEN DURING THIS VISIT:  Medications  potassium chloride SA (K-DUR,KLOR-CON) 20 MEQ CR tablet (has no administration in time range)  potassium chloride SA (K-DUR,KLOR-CON) CR tablet 40 mEq (40 mEq Oral Given 10/26/17 0059)  sodium chloride 0.9 % bolus 500 mL (0 mLs Intravenous Stopped 10/26/17 0329)  iopamidol (ISOVUE-370) 76 % injection 60 mL (60 mLs Intravenous Contrast Given 10/26/17 0157)     ED Discharge Orders    None       Note:  This document was prepared using Dragon voice recognition software and may include unintentional dictation  errors.    Hinda Kehr, MD 10/26/17 [phone]

## 2017-10-26 ENCOUNTER — Emergency Department: Payer: Medicare HMO

## 2017-10-26 DIAGNOSIS — R0789 Other chest pain: Secondary | ICD-10-CM | POA: Diagnosis not present

## 2017-10-26 LAB — FIBRIN DERIVATIVES D-DIMER (ARMC ONLY): Fibrin derivatives D-dimer (ARMC): 643.32 ng/mL (FEU) — ABNORMAL HIGH (ref 0.00–499.00)

## 2017-10-26 MED ORDER — IOPAMIDOL (ISOVUE-370) INJECTION 76%
60.0000 mL | Freq: Once | INTRAVENOUS | Status: AC | PRN
  Administered 2017-10-26: 60 mL via INTRAVENOUS

## 2017-10-26 MED ORDER — POTASSIUM CHLORIDE CRYS ER 20 MEQ PO TBCR
EXTENDED_RELEASE_TABLET | ORAL | Status: AC
  Filled 2017-10-26: qty 1

## 2017-10-26 MED ORDER — SODIUM CHLORIDE 0.9 % IV BOLUS
500.0000 mL | INTRAVENOUS | Status: AC
  Administered 2017-10-26: 500 mL via INTRAVENOUS

## 2017-10-26 NOTE — Discharge Instructions (Signed)
You have been seen in the Emergency Department (ED) today for chest pain.  As we have discussed today?s test results are normal, but you may require further testing.  Please follow up with the recommended doctor as instructed above in these documents regarding today?s emergent visit and your recent symptoms to discuss further management.  Continue to take your regular medications.   Return to the Emergency Department (ED) if you experience any further chest pain/pressure/tightness, difficulty breathing, or sudden sweating, or other symptoms that concern you.

## 2017-10-30 DIAGNOSIS — I2510 Atherosclerotic heart disease of native coronary artery without angina pectoris: Secondary | ICD-10-CM | POA: Diagnosis not present

## 2017-10-30 DIAGNOSIS — R0602 Shortness of breath: Secondary | ICD-10-CM | POA: Diagnosis not present

## 2017-10-30 DIAGNOSIS — R002 Palpitations: Secondary | ICD-10-CM | POA: Diagnosis not present

## 2017-10-30 DIAGNOSIS — I10 Essential (primary) hypertension: Secondary | ICD-10-CM | POA: Diagnosis not present

## 2017-10-30 DIAGNOSIS — G4733 Obstructive sleep apnea (adult) (pediatric): Secondary | ICD-10-CM | POA: Diagnosis not present

## 2017-10-30 DIAGNOSIS — R071 Chest pain on breathing: Secondary | ICD-10-CM | POA: Diagnosis not present

## 2017-10-30 DIAGNOSIS — E785 Hyperlipidemia, unspecified: Secondary | ICD-10-CM | POA: Diagnosis not present

## 2017-10-30 DIAGNOSIS — I4891 Unspecified atrial fibrillation: Secondary | ICD-10-CM | POA: Diagnosis not present

## 2017-11-06 DIAGNOSIS — R079 Chest pain, unspecified: Secondary | ICD-10-CM | POA: Diagnosis not present

## 2017-11-14 ENCOUNTER — Other Ambulatory Visit: Payer: Self-pay | Admitting: Cardiovascular Disease

## 2017-11-14 DIAGNOSIS — R0602 Shortness of breath: Secondary | ICD-10-CM | POA: Diagnosis not present

## 2017-11-14 DIAGNOSIS — I10 Essential (primary) hypertension: Secondary | ICD-10-CM | POA: Diagnosis not present

## 2017-11-14 DIAGNOSIS — I200 Unstable angina: Secondary | ICD-10-CM | POA: Insufficient documentation

## 2017-11-14 DIAGNOSIS — I2510 Atherosclerotic heart disease of native coronary artery without angina pectoris: Secondary | ICD-10-CM | POA: Diagnosis not present

## 2017-11-14 DIAGNOSIS — R071 Chest pain on breathing: Secondary | ICD-10-CM | POA: Diagnosis not present

## 2017-11-14 DIAGNOSIS — I4891 Unspecified atrial fibrillation: Secondary | ICD-10-CM | POA: Diagnosis not present

## 2017-11-14 DIAGNOSIS — E785 Hyperlipidemia, unspecified: Secondary | ICD-10-CM | POA: Diagnosis not present

## 2017-11-18 ENCOUNTER — Ambulatory Visit
Admission: RE | Admit: 2017-11-18 | Discharge: 2017-11-18 | Disposition: A | Discharge status: Home / Self Care | Payer: Medicare HMO | Source: Ambulatory Visit | Attending: Cardiovascular Disease | Admitting: Cardiovascular Disease

## 2017-11-18 ENCOUNTER — Encounter
Admission: RE | Disposition: A | Discharge status: Home / Self Care | Payer: Self-pay | Source: Ambulatory Visit | Attending: Cardiovascular Disease

## 2017-11-18 DIAGNOSIS — E039 Hypothyroidism, unspecified: Secondary | ICD-10-CM | POA: Diagnosis not present

## 2017-11-18 DIAGNOSIS — E119 Type 2 diabetes mellitus without complications: Secondary | ICD-10-CM | POA: Diagnosis not present

## 2017-11-18 DIAGNOSIS — I200 Unstable angina: Secondary | ICD-10-CM | POA: Insufficient documentation

## 2017-11-18 DIAGNOSIS — E785 Hyperlipidemia, unspecified: Secondary | ICD-10-CM | POA: Diagnosis not present

## 2017-11-18 DIAGNOSIS — Z7982 Long term (current) use of aspirin: Secondary | ICD-10-CM | POA: Diagnosis not present

## 2017-11-18 DIAGNOSIS — Z7901 Long term (current) use of anticoagulants: Secondary | ICD-10-CM | POA: Diagnosis not present

## 2017-11-18 DIAGNOSIS — Z9889 Other specified postprocedural states: Secondary | ICD-10-CM

## 2017-11-18 DIAGNOSIS — G4730 Sleep apnea, unspecified: Secondary | ICD-10-CM | POA: Diagnosis not present

## 2017-11-18 DIAGNOSIS — Z8249 Family history of ischemic heart disease and other diseases of the circulatory system: Secondary | ICD-10-CM | POA: Insufficient documentation

## 2017-11-18 DIAGNOSIS — R0602 Shortness of breath: Secondary | ICD-10-CM | POA: Diagnosis not present

## 2017-11-18 DIAGNOSIS — J45909 Unspecified asthma, uncomplicated: Secondary | ICD-10-CM | POA: Diagnosis not present

## 2017-11-18 DIAGNOSIS — I10 Essential (primary) hypertension: Secondary | ICD-10-CM | POA: Diagnosis not present

## 2017-11-18 DIAGNOSIS — I480 Paroxysmal atrial fibrillation: Secondary | ICD-10-CM | POA: Diagnosis not present

## 2017-11-18 DIAGNOSIS — K219 Gastro-esophageal reflux disease without esophagitis: Secondary | ICD-10-CM | POA: Diagnosis not present

## 2017-11-18 DIAGNOSIS — Z79890 Hormone replacement therapy (postmenopausal): Secondary | ICD-10-CM | POA: Diagnosis not present

## 2017-11-18 DIAGNOSIS — Z79899 Other long term (current) drug therapy: Secondary | ICD-10-CM | POA: Insufficient documentation

## 2017-11-18 HISTORY — PX: LEFT HEART CATH AND CORONARY ANGIOGRAPHY: CATH118249

## 2017-11-18 HISTORY — DX: Other specified postprocedural states: Z98.890

## 2017-11-18 SURGERY — LEFT HEART CATH AND CORONARY ANGIOGRAPHY
Anesthesia: Moderate Sedation | Laterality: Right

## 2017-11-18 MED ORDER — FENTANYL CITRATE (PF) 100 MCG/2ML IJ SOLN
INTRAMUSCULAR | Status: AC
  Filled 2017-11-18: qty 2

## 2017-11-18 MED ORDER — LABETALOL HCL 5 MG/ML IV SOLN
INTRAVENOUS | Status: AC
Start: 2017-11-18 — End: ?
  Filled 2017-11-18: qty 4

## 2017-11-18 MED ORDER — IOPAMIDOL (ISOVUE-300) INJECTION 61%
INTRAVENOUS | Status: DC | PRN
  Administered 2017-11-18: 80 mL via INTRA_ARTERIAL

## 2017-11-18 MED ORDER — SODIUM CHLORIDE 0.9 % WEIGHT BASED INFUSION: 1.0000 mL/kg/h | INTRAVENOUS | Status: DC

## 2017-11-18 MED ORDER — MIDAZOLAM HCL 2 MG/2ML IJ SOLN
INTRAMUSCULAR | Status: DC | PRN
  Administered 2017-11-18: 1 mg via INTRAVENOUS

## 2017-11-18 MED ORDER — SODIUM CHLORIDE 0.9 % WEIGHT BASED INFUSION
3.0000 mL/kg/h | INTRAVENOUS | Status: AC
  Administered 2017-11-18: 3 mL/kg/h via INTRAVENOUS

## 2017-11-18 MED ORDER — LABETALOL HCL 5 MG/ML IV SOLN
INTRAVENOUS | Status: DC | PRN
  Administered 2017-11-18 (×2): 10 mg via INTRAVENOUS

## 2017-11-18 MED ORDER — SODIUM CHLORIDE 0.9 % IV SOLN: 250.0000 mL | INTRAVENOUS | Status: DC | PRN

## 2017-11-18 MED ORDER — SODIUM CHLORIDE 0.9% FLUSH: 3.0000 mL | INTRAVENOUS | Status: DC | PRN

## 2017-11-18 MED ORDER — SODIUM CHLORIDE 0.9% FLUSH: 3.0000 mL | Freq: Two times a day (BID) | INTRAVENOUS | Status: DC

## 2017-11-18 MED ORDER — MIDAZOLAM HCL 2 MG/2ML IJ SOLN
INTRAMUSCULAR | Status: AC
Start: 2017-11-18 — End: ?
  Filled 2017-11-18: qty 2

## 2017-11-18 MED ORDER — HEPARIN (PORCINE) IN NACL 1000-0.9 UT/500ML-% IV SOLN
INTRAVENOUS | Status: AC
Start: 2017-11-18 — End: ?
  Filled 2017-11-18: qty 1000

## 2017-11-18 MED ORDER — FENTANYL CITRATE (PF) 100 MCG/2ML IJ SOLN
INTRAMUSCULAR | Status: DC | PRN
  Administered 2017-11-18: 25 ug via INTRAVENOUS

## 2017-11-18 MED ORDER — LIDOCAINE HCL (PF) 1 % IJ SOLN
INTRAMUSCULAR | Status: AC
Start: 2017-11-18 — End: ?
  Filled 2017-11-18: qty 30

## 2017-11-18 SURGICAL SUPPLY — 10 items
CATH INFINITI 5FR ANG PIGTAIL (CATHETERS) ×1 IMPLANT
CATH INFINITI 5FR JL4 (CATHETERS) ×1 IMPLANT
CATH INFINITI JR4 5F (CATHETERS) ×1 IMPLANT
DEVICE CLOSURE MYNXGRIP 5F (Vascular Products) ×1 IMPLANT
KIT MANI 3VAL PERCEP (MISCELLANEOUS) ×2 IMPLANT
NDL PERC 18GX7CM (NEEDLE) IMPLANT
NEEDLE PERC 18GX7CM (NEEDLE) ×2 IMPLANT
PACK CARDIAC CATH (CUSTOM PROCEDURE TRAY) ×2 IMPLANT
SHEATH AVANTI 5FR X 11CM (SHEATH) ×1 IMPLANT
WIRE GUIDERIGHT .035X150 (WIRE) ×1 IMPLANT

## 2017-11-18 NOTE — Discharge Instructions (Signed)

## 2017-11-19 ENCOUNTER — Encounter: Payer: Self-pay | Admitting: Cardiovascular Disease

## 2017-11-22 DIAGNOSIS — R071 Chest pain on breathing: Secondary | ICD-10-CM | POA: Diagnosis not present

## 2017-11-22 DIAGNOSIS — I10 Essential (primary) hypertension: Secondary | ICD-10-CM | POA: Diagnosis not present

## 2017-11-22 DIAGNOSIS — R0602 Shortness of breath: Secondary | ICD-10-CM | POA: Diagnosis not present

## 2017-11-22 DIAGNOSIS — I4891 Unspecified atrial fibrillation: Secondary | ICD-10-CM | POA: Diagnosis not present

## 2017-11-22 DIAGNOSIS — G4733 Obstructive sleep apnea (adult) (pediatric): Secondary | ICD-10-CM | POA: Diagnosis not present

## 2017-11-28 DIAGNOSIS — R911 Solitary pulmonary nodule: Secondary | ICD-10-CM | POA: Diagnosis not present

## 2017-11-28 DIAGNOSIS — R918 Other nonspecific abnormal finding of lung field: Secondary | ICD-10-CM | POA: Diagnosis not present

## 2017-11-28 DIAGNOSIS — G4733 Obstructive sleep apnea (adult) (pediatric): Secondary | ICD-10-CM | POA: Diagnosis not present

## 2017-11-28 DIAGNOSIS — R05 Cough: Secondary | ICD-10-CM | POA: Diagnosis not present

## 2017-12-02 DIAGNOSIS — R51 Headache: Secondary | ICD-10-CM

## 2017-12-02 DIAGNOSIS — R296 Repeated falls: Secondary | ICD-10-CM | POA: Diagnosis not present

## 2017-12-02 DIAGNOSIS — R413 Other amnesia: Secondary | ICD-10-CM | POA: Insufficient documentation

## 2017-12-02 DIAGNOSIS — R519 Headache, unspecified: Secondary | ICD-10-CM | POA: Insufficient documentation

## 2017-12-03 DIAGNOSIS — I340 Nonrheumatic mitral (valve) insufficiency: Secondary | ICD-10-CM | POA: Diagnosis not present

## 2017-12-03 DIAGNOSIS — G4733 Obstructive sleep apnea (adult) (pediatric): Secondary | ICD-10-CM | POA: Diagnosis not present

## 2017-12-03 DIAGNOSIS — I4891 Unspecified atrial fibrillation: Secondary | ICD-10-CM | POA: Diagnosis not present

## 2017-12-03 DIAGNOSIS — E785 Hyperlipidemia, unspecified: Secondary | ICD-10-CM | POA: Diagnosis not present

## 2018-01-10 DIAGNOSIS — E890 Postprocedural hypothyroidism: Secondary | ICD-10-CM | POA: Diagnosis not present

## 2018-01-10 DIAGNOSIS — E892 Postprocedural hypoparathyroidism: Secondary | ICD-10-CM | POA: Diagnosis not present

## 2018-01-14 DIAGNOSIS — R918 Other nonspecific abnormal finding of lung field: Secondary | ICD-10-CM | POA: Diagnosis not present

## 2018-01-14 DIAGNOSIS — I480 Paroxysmal atrial fibrillation: Secondary | ICD-10-CM | POA: Diagnosis not present

## 2018-01-14 DIAGNOSIS — L989 Disorder of the skin and subcutaneous tissue, unspecified: Secondary | ICD-10-CM | POA: Diagnosis not present

## 2018-01-14 DIAGNOSIS — E7800 Pure hypercholesterolemia, unspecified: Secondary | ICD-10-CM | POA: Diagnosis not present

## 2018-01-14 DIAGNOSIS — G935 Compression of brain: Secondary | ICD-10-CM | POA: Diagnosis not present

## 2018-01-14 DIAGNOSIS — I10 Essential (primary) hypertension: Secondary | ICD-10-CM | POA: Diagnosis not present

## 2018-01-14 DIAGNOSIS — E892 Postprocedural hypoparathyroidism: Secondary | ICD-10-CM | POA: Diagnosis not present

## 2018-01-14 DIAGNOSIS — N183 Chronic kidney disease, stage 3 (moderate): Secondary | ICD-10-CM | POA: Diagnosis not present

## 2018-01-14 DIAGNOSIS — G4733 Obstructive sleep apnea (adult) (pediatric): Secondary | ICD-10-CM | POA: Diagnosis not present

## 2018-01-17 DIAGNOSIS — E890 Postprocedural hypothyroidism: Secondary | ICD-10-CM | POA: Diagnosis not present

## 2018-01-17 DIAGNOSIS — Z85850 Personal history of malignant neoplasm of thyroid: Secondary | ICD-10-CM | POA: Diagnosis not present

## 2018-01-17 DIAGNOSIS — E892 Postprocedural hypoparathyroidism: Secondary | ICD-10-CM | POA: Diagnosis not present

## 2018-01-21 DIAGNOSIS — R918 Other nonspecific abnormal finding of lung field: Secondary | ICD-10-CM | POA: Diagnosis not present

## 2018-01-21 DIAGNOSIS — E890 Postprocedural hypothyroidism: Secondary | ICD-10-CM | POA: Diagnosis not present

## 2018-01-21 DIAGNOSIS — N183 Chronic kidney disease, stage 3 (moderate): Secondary | ICD-10-CM | POA: Diagnosis not present

## 2018-01-21 DIAGNOSIS — D649 Anemia, unspecified: Secondary | ICD-10-CM | POA: Diagnosis not present

## 2018-01-21 DIAGNOSIS — I480 Paroxysmal atrial fibrillation: Secondary | ICD-10-CM | POA: Diagnosis not present

## 2018-01-21 DIAGNOSIS — R296 Repeated falls: Secondary | ICD-10-CM | POA: Diagnosis not present

## 2018-01-21 DIAGNOSIS — G935 Compression of brain: Secondary | ICD-10-CM | POA: Diagnosis not present

## 2018-01-21 DIAGNOSIS — I10 Essential (primary) hypertension: Secondary | ICD-10-CM | POA: Diagnosis not present

## 2018-01-21 DIAGNOSIS — E876 Hypokalemia: Secondary | ICD-10-CM | POA: Diagnosis not present

## 2018-02-13 DIAGNOSIS — R05 Cough: Secondary | ICD-10-CM | POA: Diagnosis not present

## 2018-02-13 DIAGNOSIS — R0609 Other forms of dyspnea: Secondary | ICD-10-CM | POA: Diagnosis not present

## 2018-02-13 DIAGNOSIS — R918 Other nonspecific abnormal finding of lung field: Secondary | ICD-10-CM | POA: Diagnosis not present

## 2018-02-13 DIAGNOSIS — G4733 Obstructive sleep apnea (adult) (pediatric): Secondary | ICD-10-CM | POA: Diagnosis not present

## 2018-02-24 DIAGNOSIS — M545 Low back pain: Secondary | ICD-10-CM | POA: Diagnosis not present

## 2018-02-24 DIAGNOSIS — R300 Dysuria: Secondary | ICD-10-CM | POA: Diagnosis not present

## 2018-02-24 DIAGNOSIS — R350 Frequency of micturition: Secondary | ICD-10-CM | POA: Diagnosis not present

## 2018-02-24 DIAGNOSIS — R8290 Unspecified abnormal findings in urine: Secondary | ICD-10-CM | POA: Diagnosis not present

## 2018-02-24 DIAGNOSIS — R399 Unspecified symptoms and signs involving the genitourinary system: Secondary | ICD-10-CM | POA: Diagnosis not present

## 2018-03-11 DIAGNOSIS — R0602 Shortness of breath: Secondary | ICD-10-CM | POA: Diagnosis not present

## 2018-03-11 DIAGNOSIS — E785 Hyperlipidemia, unspecified: Secondary | ICD-10-CM | POA: Diagnosis not present

## 2018-03-11 DIAGNOSIS — I10 Essential (primary) hypertension: Secondary | ICD-10-CM | POA: Diagnosis not present

## 2018-03-11 DIAGNOSIS — I2510 Atherosclerotic heart disease of native coronary artery without angina pectoris: Secondary | ICD-10-CM | POA: Diagnosis not present

## 2018-03-11 DIAGNOSIS — I4891 Unspecified atrial fibrillation: Secondary | ICD-10-CM | POA: Diagnosis not present

## 2018-03-27 DIAGNOSIS — D2261 Melanocytic nevi of right upper limb, including shoulder: Secondary | ICD-10-CM | POA: Diagnosis not present

## 2018-03-27 DIAGNOSIS — D2262 Melanocytic nevi of left upper limb, including shoulder: Secondary | ICD-10-CM | POA: Diagnosis not present

## 2018-03-27 DIAGNOSIS — L821 Other seborrheic keratosis: Secondary | ICD-10-CM | POA: Diagnosis not present

## 2018-03-27 DIAGNOSIS — D225 Melanocytic nevi of trunk: Secondary | ICD-10-CM | POA: Diagnosis not present

## 2018-03-27 DIAGNOSIS — L538 Other specified erythematous conditions: Secondary | ICD-10-CM | POA: Diagnosis not present

## 2018-03-27 DIAGNOSIS — L218 Other seborrheic dermatitis: Secondary | ICD-10-CM | POA: Diagnosis not present

## 2018-03-27 DIAGNOSIS — L820 Inflamed seborrheic keratosis: Secondary | ICD-10-CM | POA: Diagnosis not present

## 2018-04-02 ENCOUNTER — Ambulatory Visit
Admission: RE | Admit: 2018-04-02 | Discharge: 2018-04-02 | Disposition: A | Discharge status: Home / Self Care | Payer: Medicare HMO | Source: Ambulatory Visit | Attending: Internal Medicine | Admitting: Internal Medicine

## 2018-04-02 ENCOUNTER — Inpatient Hospital Stay: Payer: Medicare HMO | Attending: Internal Medicine

## 2018-04-02 DIAGNOSIS — Z7901 Long term (current) use of anticoagulants: Secondary | ICD-10-CM | POA: Insufficient documentation

## 2018-04-02 DIAGNOSIS — Z79899 Other long term (current) drug therapy: Secondary | ICD-10-CM | POA: Insufficient documentation

## 2018-04-02 DIAGNOSIS — K219 Gastro-esophageal reflux disease without esophagitis: Secondary | ICD-10-CM | POA: Insufficient documentation

## 2018-04-02 DIAGNOSIS — R918 Other nonspecific abnormal finding of lung field: Secondary | ICD-10-CM

## 2018-04-02 DIAGNOSIS — I2510 Atherosclerotic heart disease of native coronary artery without angina pectoris: Secondary | ICD-10-CM | POA: Insufficient documentation

## 2018-04-02 DIAGNOSIS — E785 Hyperlipidemia, unspecified: Secondary | ICD-10-CM | POA: Diagnosis not present

## 2018-04-02 DIAGNOSIS — E890 Postprocedural hypothyroidism: Secondary | ICD-10-CM | POA: Diagnosis not present

## 2018-04-02 DIAGNOSIS — Z8673 Personal history of transient ischemic attack (TIA), and cerebral infarction without residual deficits: Secondary | ICD-10-CM | POA: Insufficient documentation

## 2018-04-02 DIAGNOSIS — D631 Anemia in chronic kidney disease: Secondary | ICD-10-CM | POA: Diagnosis not present

## 2018-04-02 DIAGNOSIS — I129 Hypertensive chronic kidney disease with stage 1 through stage 4 chronic kidney disease, or unspecified chronic kidney disease: Secondary | ICD-10-CM | POA: Insufficient documentation

## 2018-04-02 DIAGNOSIS — Z85850 Personal history of malignant neoplasm of thyroid: Secondary | ICD-10-CM | POA: Insufficient documentation

## 2018-04-02 DIAGNOSIS — Z791 Long term (current) use of non-steroidal anti-inflammatories (NSAID): Secondary | ICD-10-CM | POA: Insufficient documentation

## 2018-04-02 DIAGNOSIS — G4730 Sleep apnea, unspecified: Secondary | ICD-10-CM | POA: Diagnosis not present

## 2018-04-02 DIAGNOSIS — I700 Atherosclerosis of aorta: Secondary | ICD-10-CM | POA: Insufficient documentation

## 2018-04-02 DIAGNOSIS — J449 Chronic obstructive pulmonary disease, unspecified: Secondary | ICD-10-CM | POA: Diagnosis not present

## 2018-04-02 DIAGNOSIS — N183 Chronic kidney disease, stage 3 (moderate): Secondary | ICD-10-CM | POA: Insufficient documentation

## 2018-04-02 DIAGNOSIS — I4891 Unspecified atrial fibrillation: Secondary | ICD-10-CM | POA: Diagnosis not present

## 2018-04-02 LAB — CBC WITH DIFFERENTIAL/PLATELET
ABS IMMATURE GRANULOCYTES: 0.04 10*3/uL (ref 0.00–0.07)
BASOS ABS: 0 10*3/uL (ref 0.0–0.1)
BASOS PCT: 1 %
Eosinophils Absolute: 0.1 10*3/uL (ref 0.0–0.5)
Eosinophils Relative: 2 %
HCT: 35.4 % — ABNORMAL LOW (ref 36.0–46.0)
Hemoglobin: 11.2 g/dL — ABNORMAL LOW (ref 12.0–15.0)
Immature Granulocytes: 1 %
LYMPHS PCT: 24 %
Lymphs Abs: 1.7 10*3/uL (ref 0.7–4.0)
MCH: 28.6 pg (ref 26.0–34.0)
MCHC: 31.6 g/dL (ref 30.0–36.0)
MCV: 90.3 fL (ref 80.0–100.0)
MONO ABS: 0.5 10*3/uL (ref 0.1–1.0)
Monocytes Relative: 7 %
NRBC: 0 % (ref 0.0–0.2)
Neutro Abs: 4.5 10*3/uL (ref 1.7–7.7)
Neutrophils Relative %: 65 %
PLATELETS: 205 10*3/uL (ref 150–400)
RBC: 3.92 MIL/uL (ref 3.87–5.11)
RDW: 14.7 % (ref 11.5–15.5)
WBC: 6.8 10*3/uL (ref 4.0–10.5)

## 2018-04-02 LAB — BASIC METABOLIC PANEL
Anion gap: 8 (ref 5–15)
BUN: 27 mg/dL — AB (ref 8–23)
CHLORIDE: 109 mmol/L (ref 98–111)
CO2: 29 mmol/L (ref 22–32)
CREATININE: 1.57 mg/dL — AB (ref 0.44–1.00)
Calcium: 10.8 mg/dL — ABNORMAL HIGH (ref 8.9–10.3)
GFR calc non Af Amer: 34 mL/min — ABNORMAL LOW (ref >60)
GFR, EST AFRICAN AMERICAN: 40 mL/min — AB (ref >60)
Glucose, Bld: 102 mg/dL — ABNORMAL HIGH (ref 70–99)
Potassium: 3.4 mmol/L — ABNORMAL LOW (ref 3.5–5.1)
SODIUM: 146 mmol/L — AB (ref 135–145)

## 2018-04-04 ENCOUNTER — Other Ambulatory Visit: Payer: Self-pay

## 2018-04-04 ENCOUNTER — Inpatient Hospital Stay (HOSPITAL_BASED_OUTPATIENT_CLINIC_OR_DEPARTMENT_OTHER): Payer: Medicare HMO | Admitting: Internal Medicine

## 2018-04-04 VITALS — BP 137/81 | HR 86 | Temp 97.5°F | Wt 151.6 lb

## 2018-04-04 DIAGNOSIS — I2510 Atherosclerotic heart disease of native coronary artery without angina pectoris: Secondary | ICD-10-CM

## 2018-04-04 DIAGNOSIS — N183 Chronic kidney disease, stage 3 (moderate): Secondary | ICD-10-CM

## 2018-04-04 DIAGNOSIS — I129 Hypertensive chronic kidney disease with stage 1 through stage 4 chronic kidney disease, or unspecified chronic kidney disease: Secondary | ICD-10-CM

## 2018-04-04 DIAGNOSIS — Z8673 Personal history of transient ischemic attack (TIA), and cerebral infarction without residual deficits: Secondary | ICD-10-CM

## 2018-04-04 DIAGNOSIS — G4730 Sleep apnea, unspecified: Secondary | ICD-10-CM

## 2018-04-04 DIAGNOSIS — E785 Hyperlipidemia, unspecified: Secondary | ICD-10-CM | POA: Diagnosis not present

## 2018-04-04 DIAGNOSIS — Z85850 Personal history of malignant neoplasm of thyroid: Secondary | ICD-10-CM | POA: Diagnosis not present

## 2018-04-04 DIAGNOSIS — E890 Postprocedural hypothyroidism: Secondary | ICD-10-CM

## 2018-04-04 DIAGNOSIS — I4891 Unspecified atrial fibrillation: Secondary | ICD-10-CM

## 2018-04-04 DIAGNOSIS — J449 Chronic obstructive pulmonary disease, unspecified: Secondary | ICD-10-CM | POA: Diagnosis not present

## 2018-04-04 DIAGNOSIS — R918 Other nonspecific abnormal finding of lung field: Secondary | ICD-10-CM | POA: Diagnosis not present

## 2018-04-04 DIAGNOSIS — D631 Anemia in chronic kidney disease: Secondary | ICD-10-CM | POA: Diagnosis not present

## 2018-04-04 DIAGNOSIS — Z791 Long term (current) use of non-steroidal anti-inflammatories (NSAID): Secondary | ICD-10-CM

## 2018-04-04 DIAGNOSIS — Z79899 Other long term (current) drug therapy: Secondary | ICD-10-CM

## 2018-04-04 DIAGNOSIS — K219 Gastro-esophageal reflux disease without esophagitis: Secondary | ICD-10-CM

## 2018-04-04 DIAGNOSIS — Z7901 Long term (current) use of anticoagulants: Secondary | ICD-10-CM

## 2018-04-04 NOTE — Progress Notes (Signed)
Patient stated that she has a dry cough, non-productive. Patient stated that one of her blood pressure medication was causing the cough but Dr. Ginette Pitman change her prescription to Losartan instead of Lisinopril.

## 2018-04-04 NOTE — Assessment & Plan Note (Addendum)
#   Multiple lung nodules/groudn glass opacities [at least since 2015]-slightly progressive on the most recent CT scan April 2019; however scan Nov 2019- STABLE; likely slow-growing multifocal adenocarcinoma-based on imaging no biopsy done.  Continue surveillance with a CT scan in 6 months.  # Anemia/CKD-stage III; hemoglobin around 11.improved; Iron sat- 10%  Continue   Patient on p.o. Iron.  # Elevated calcium-10.8.  Unlikely related to malignancy.  On Tums [as per endocrinologist]; recommend taking half of tums; and defer to PCP/endocrinology.   # Thyroid cancer-s/p RAIU [2000]- stable/ no recurrence.Tumor makers- Normal.  # A.fib/eliquis [Dr.Khan]- off amiodarone [may 2019]; lung lesions unlikely related to amiodarone.  #  DISPOSITION:  # follow up with me in 6 months/labs;CT prior.  # 25 minutes face-to-face with the patient discussing the above plan of care; more than 50% of time spent on prognosis/ natural history; counseling and coordination.  Cc; Dr.Solum/ Hande/ Khan/ Raul Del

## 2018-04-04 NOTE — Progress Notes (Signed)
[facility] OFFICE PROGRESS NOTE  Patient Care Team: [name], MD as PCP - General (Internal Medicine) Telford Nab, RN as Registered Nurse  Cancer Staging No matching staging information was found for the patient.   Oncology History   # Lung nodules/GOO Multiple [id number]; Dr.Oaks].  # LLL nodule 8-51mm/ and bil GGO  [May 219- Off amiodarone; Dr.Khan;cards].   # Thyroid cancer [2000; [facility]] s/p RAI; post-surgical hypothyroidism/ post-surgical hypoparathyroidism.s/p  total thyroidectomy and was found to have a 6 cm follicular thyroid cancer without any local invasion. This was a [id number] follicular thyroid cancer, stage I. S/p radioiodine (115 mCi) and subsequently in 2005, 2006, and 2008 had serially negative thyrogen-stimulated whole body scans. f/u- Dr.Solum.   # CKD/ stage III; ? COPD/asthma [Dr.Fleming]; Afib [on eliquis; Dr.Khan]         Thyroid cancer ([place])      INTERVAL HISTORY:  Shaquaya Wuellner Sistare 62 y.o.  female pleasant patient above history of multiple lung nodules currently on surveillance of unclear etiology-is here for follow-up/review the results CT scan.  Patient continues to have mild shortness of breath with exertion.  Chronic mild cough.  No hemoptysis.  No nausea no vomiting no headaches.   Review of Systems  Constitutional: Negative for chills, diaphoresis, fever, malaise/fatigue and weight loss.  HENT: Negative for nosebleeds and sore throat.   Eyes: Negative for double vision.  Respiratory: Positive for cough, sputum production and shortness of breath. Negative for hemoptysis and wheezing.   Cardiovascular: Negative for chest pain, palpitations, orthopnea and leg swelling.  Gastrointestinal: Negative for abdominal pain, blood in stool, constipation, diarrhea, heartburn, melena, nausea and vomiting.  Genitourinary: Negative for dysuria, frequency and urgency.  Musculoskeletal: Negative for back pain and joint pain.  Skin: Negative.  Negative  for itching and rash.  Neurological: Negative for dizziness, tingling, focal weakness, weakness and headaches.  Endo/Heme/Allergies: Does not bruise/bleed easily.  Psychiatric/Behavioral: Negative for depression. The patient is not nervous/anxious and does not have insomnia.       PAST MEDICAL HISTORY :  Past Medical History:  Diagnosis Date  . Allergy   . Anemia   . Asthma   . Atrial fibrillation ([place])   . Cerebrovascular accident [facility])    History of visual deficit  . Chiari I malformation ([place])   . Coronary artery disease   . Depression   . Dyspnea   . GERD (gastroesophageal reflux disease)   . Hyperlipidemia   . Hypertension   . Hypoparathyroidism ([place])   . Hypothyroidism   . Personality disorder, depressive   . PONV (postoperative nausea and vomiting)    difficulty breathing during endoscopy, colonoscopy performed prior w/out problems  . Renal insufficiency   . Sleep apnea   . Thyroid cancer ([place]) 1999   Papillary  . Thyroid disease    hypothyroid     PAST SURGICAL HISTORY :   Past Surgical History:  Procedure Laterality Date  . CANNOT TOLERATE PAP / Virginal    . CATARACT EXTRACTION, BILATERAL    . LEFT HEART CATH AND CORONARY ANGIOGRAPHY Right 11/18/2017   Procedure: Left Heart Cath with possible coronary intervention;  Surgeon: Dionisio David, MD;  Location: [place] [facility];  Service: Cardiovascular;  Laterality: Right;  . MRI brain  12/1999  . THYROIDECTOMY    . TONSILLECTOMY    . TOTAL HIP ARTHROPLASTY Right 06/26/2016   Procedure: TOTAL HIP ARTHROPLASTY ANTERIOR APPROACH;  Surgeon: Hessie Knows, MD;  Location: [facility] ORS;  Service:  Orthopedics;  Laterality: Right;    FAMILY HISTORY :   Family History  Problem Relation Age of Onset  . Diabetes Father   . Diabetes Paternal Aunt   . Diabetes Paternal Uncle   . Breast cancer Sister 25    SOCIAL HISTORY:   Social History   Tobacco Use  . Smoking status: Never Smoker  . Smokeless tobacco: Never Used   Substance Use Topics  . Alcohol use: No  . Drug use: No    ALLERGIES:  is allergic to aspirin-dipyridamole er and rosuvastatin.  MEDICATIONS:  Current Outpatient Medications  Medication Sig Dispense Refill  . acetaminophen (TYLENOL) 500 MG tablet Take 500 mg by mouth every 6 (six) hours as needed for mild pain.    Marland Kitchen albuterol (PROVENTIL HFA;VENTOLIN HFA) 108 (90 BASE) MCG/ACT inhaler Inhale 2 puffs into the lungs every 6 (six) hours as needed for wheezing or shortness of breath.     Marland Kitchen apixaban (ELIQUIS) 5 MG TABS tablet Take 5 mg by mouth 2 (two) times daily.     Marland Kitchen atorvastatin (LIPITOR) 80 MG tablet Take 1 tablet by mouth at bedtime.     Marland Kitchen azelastine (ASTELIN) 0.1 % nasal spray Place 2 sprays into both nostrils 2 (two) times daily. Use in each nostril as directed    . beclomethasone (QVAR) 80 MCG/ACT inhaler Inhale 2 puffs into the lungs daily as needed (Asthma).    . calcitRIOL (ROCALTROL) 0.25 MCG capsule Take 0.25-0.5 mcg by mouth See admin instructions. Take 2 capsules every morning and 1 capsule every evening.     . calcium carbonate (TUMS E-X 750) 750 MG chewable tablet Chew 2 tablets by mouth daily.    . hydrocortisone 2.5 % lotion     . iron polysaccharides (NIFEREX) 150 MG capsule TAKE 1 CAPSULE(150 MG) BY MOUTH EVERY DAY    . ketoconazole (NIZORAL) 2 % shampoo     . levothyroxine (SYNTHROID, LEVOTHROID) 137 MCG tablet Take 1 tablet by mouth daily before breakfast.     . losartan (COZAAR) 25 MG tablet     . metoprolol (LOPRESSOR) 50 MG tablet Take 50 mg by mouth 2 (two) times daily.     . montelukast (SINGULAIR) 10 MG tablet Take 10 mg by mouth at bedtime.    . Multiple Vitamin (MULTIVITAMIN) tablet Take 1 tablet by mouth daily.    . naproxen (NAPROSYN) 250 MG tablet Take 250 mg by mouth 2 (two) times daily with a meal.    . niacin 500 MG tablet Take 500 mg by mouth daily.    . nitroGLYCERIN (NITROSTAT) 0.4 MG SL tablet Place 0.4 mg under the tongue every 5 (five) minutes as  needed for chest pain.     . nortriptyline (PAMELOR) 10 MG capsule TAKE 1 TO 2 CAPSULES BY MOUTH AT BEDTIME as needed for migraines    . pantoprazole (PROTONIX) 40 MG tablet Take 40 mg by mouth daily.    . potassium chloride SA (K-DUR,KLOR-CON) 20 MEQ tablet TAKE 1 TABLET(20 MEQ) BY MOUTH EVERY DAY    . Propylene Glycol (SYSTANE BALANCE) 0.6 % SOLN Place 1 drop into both eyes at bedtime.    . sotalol (BETAPACE) 80 MG tablet Take 1 tablet by mouth daily.    . sucralfate (CARAFATE) 1 g tablet Take 1 g by mouth 2 (two) times daily.    . vitamin C (ASCORBIC ACID) 500 MG tablet Take 500 mg by mouth daily.    . Fluticasone-Salmeterol (ADVAIR) 250-50 MCG/DOSE AEPB Inhale  1 puff into the lungs 2 (two) times daily.    . mometasone (NASONEX) 50 MCG/ACT nasal spray Place 2 sprays into the nose daily as needed (allergies).      No current facility-administered medications for this visit.     PHYSICAL EXAMINATION: ECOG PERFORMANCE STATUS: 1 - Symptomatic but completely ambulatory  BP 137/81 (BP Location: Left Arm, Patient Position: Sitting)   Pulse 86   Temp (!) 97.5 F (36.4 C) (Oral)   Wt 151 lb 9.6 oz (68.8 kg)   BMI 26.85 kg/m   Filed Weights   04/04/18 1353  Weight: 151 lb 9.6 oz (68.8 kg)    Physical Exam  Constitutional: She is oriented to person, place, and time and well-developed, well-nourished, and in no distress.  Alone.  HENT:  Head: Normocephalic and atraumatic.  Mouth/Throat: Oropharynx is clear and moist. No oropharyngeal exudate.  Eyes: Pupils are equal, round, and reactive to light.  Neck: Normal range of motion. Neck supple.  Cardiovascular: Normal rate and regular rhythm.  Pulmonary/Chest: No respiratory distress. She has no wheezes.  Decreased air entry bilaterally.  Abdominal: Soft. Bowel sounds are normal. She exhibits no distension and no mass. There is no tenderness. There is no rebound and no guarding.  Musculoskeletal: Normal range of motion. She exhibits no  edema or tenderness.  Neurological: She is alert and oriented to person, place, and time.  Skin: Skin is warm.  Psychiatric: Affect normal.       LABORATORY DATA:  I have reviewed the data as listed    Component Value Date/Time   NA 146 (H) 04/02/2018 0844   NA 139 09/03/2013 1947   K 3.4 (L) 04/02/2018 0844   K 3.7 09/03/2013 1947   CL 109 04/02/2018 0844   CL 104 09/03/2013 1947   CO2 29 04/02/2018 0844   CO2 28 09/03/2013 1947   GLUCOSE 102 (H) 04/02/2018 0844   GLUCOSE 123 (H) 09/03/2013 1947   BUN 27 (H) 04/02/2018 0844   BUN 22 (H) 09/03/2013 1947   CREATININE 1.57 (H) 04/02/2018 0844   CREATININE 0.96 09/03/2013 1947   CALCIUM 10.8 (H) 04/02/2018 0844   CALCIUM 7.7 (L) 09/03/2013 1947   CALCIUM 8.3 (L) 12/19/2009 0000   PROT 7.4 09/09/2017 1209   PROT 7.4 09/03/2013 1947   ALBUMIN 4.3 09/09/2017 1209   ALBUMIN 3.6 09/03/2013 1947   AST 30 09/09/2017 1209   AST 19 09/03/2013 1947   ALT 33 09/09/2017 1209   ALT 22 09/03/2013 1947   ALKPHOS 103 09/09/2017 1209   ALKPHOS 103 09/03/2013 1947   BILITOT 0.7 09/09/2017 1209   BILITOT 0.5 09/03/2013 1947   GFRNONAA 34 (L) 04/02/2018 0844   GFRNONAA >60 09/03/2013 1947   GFRAA 40 (L) 04/02/2018 0844   GFRAA >60 09/03/2013 1947    No results found for: SPEP, UPEP  Lab Results  Component Value Date   WBC 6.8 04/02/2018   NEUTROABS 4.5 04/02/2018   HGB 11.2 (L) 04/02/2018   HCT 35.4 (L) 04/02/2018   MCV 90.3 04/02/2018   PLT 205 04/02/2018      Chemistry      Component Value Date/Time   NA 146 (H) 04/02/2018 0844   NA 139 09/03/2013 1947   K 3.4 (L) 04/02/2018 0844   K 3.7 09/03/2013 1947   CL 109 04/02/2018 0844   CL 104 09/03/2013 1947   CO2 29 04/02/2018 0844   CO2 28 09/03/2013 1947   BUN 27 (H) 04/02/2018 0932  BUN 22 (H) 09/03/2013 1947   CREATININE 1.57 (H) 04/02/2018 0844   CREATININE 0.96 09/03/2013 1947      Component Value Date/Time   CALCIUM 10.8 (H) 04/02/2018 0844   CALCIUM 7.7  (L) 09/03/2013 1947   CALCIUM 8.3 (L) 12/19/2009 0000   ALKPHOS 103 09/09/2017 1209   ALKPHOS 103 09/03/2013 1947   AST 30 09/09/2017 1209   AST 19 09/03/2013 1947   ALT 33 09/09/2017 1209   ALT 22 09/03/2013 1947   BILITOT 0.7 09/09/2017 1209   BILITOT 0.5 09/03/2013 1947       RADIOGRAPHIC STUDIES: I have personally reviewed the radiological images as listed and agreed with the findings in the report. No results found.   ASSESSMENT & PLAN:  Multiple lung nodules # Multiple lung nodules/groudn glass opacities [at least since 2015]-slightly progressive on the most recent CT scan April 2019; however scan Nov 2019- STABLE; likely slow-growing multifocal adenocarcinoma-based on imaging no biopsy done.  Continue surveillance with a CT scan in 6 months.  # Anemia/CKD-stage III; hemoglobin around 11.improved; Iron sat- 10%  Continue   Patient on p.o. Iron.  # Elevated calcium-10.8.  Unlikely related to malignancy.  On Tums [as per endocrinologist]; recommend taking half of tums; and defer to PCP/endocrinology.   # Thyroid cancer-s/p RAIU [2000]- stable/ no recurrence.Tumor makers- Normal.  # A.fib/eliquis [Dr.Khan]- off amiodarone [may 2019]; lung lesions unlikely related to amiodarone.  #  DISPOSITION:  # follow up with me in 6 months/labs;CT prior.  # 25 minutes face-to-face with the patient discussing the above plan of care; more than 50% of time spent on prognosis/ natural history; counseling and coordination.  Cc; Dr.Solum/ Hande/ Khan/ Fleming   Orders Placed This Encounter  Procedures  . CT CHEST WO CONTRAST    Standing Status:   Future    Standing Expiration Date:   04/05/2019    Order Specific Question:   Preferred imaging location?    Answer:   [facility]    Order Specific Question:   Radiology Contrast Protocol - do NOT remove file path    Answer:   [email or web address]    Order Specific Question:   ** REASON FOR EXAM (FREE TEXT)     Answer:   lung nodules  . CBC with Differential    Standing Status:   Future    Standing Expiration Date:   04/04/2019  . Comprehensive metabolic panel    Standing Status:   Future    Standing Expiration Date:   04/04/2019   All questions were answered. The patient knows to call the clinic with any problems, questions or concerns.      Cammie Sickle, MD 04/07/2018 7:21 AM

## 2018-04-14 DIAGNOSIS — R05 Cough: Secondary | ICD-10-CM | POA: Diagnosis not present

## 2018-04-14 DIAGNOSIS — R0609 Other forms of dyspnea: Secondary | ICD-10-CM | POA: Diagnosis not present

## 2018-04-14 DIAGNOSIS — G4733 Obstructive sleep apnea (adult) (pediatric): Secondary | ICD-10-CM | POA: Diagnosis not present

## 2018-04-14 DIAGNOSIS — J310 Chronic rhinitis: Secondary | ICD-10-CM | POA: Diagnosis not present

## 2018-05-09 DIAGNOSIS — H35371 Puckering of macula, right eye: Secondary | ICD-10-CM | POA: Diagnosis not present

## 2018-06-17 DIAGNOSIS — R296 Repeated falls: Secondary | ICD-10-CM | POA: Diagnosis not present

## 2018-06-17 DIAGNOSIS — Z0000 Encounter for general adult medical examination without abnormal findings: Secondary | ICD-10-CM | POA: Diagnosis not present

## 2018-06-17 DIAGNOSIS — R918 Other nonspecific abnormal finding of lung field: Secondary | ICD-10-CM | POA: Diagnosis not present

## 2018-06-17 DIAGNOSIS — I10 Essential (primary) hypertension: Secondary | ICD-10-CM | POA: Diagnosis not present

## 2018-06-17 DIAGNOSIS — E890 Postprocedural hypothyroidism: Secondary | ICD-10-CM | POA: Diagnosis not present

## 2018-06-17 DIAGNOSIS — G935 Compression of brain: Secondary | ICD-10-CM | POA: Diagnosis not present

## 2018-06-17 DIAGNOSIS — N183 Chronic kidney disease, stage 3 (moderate): Secondary | ICD-10-CM | POA: Diagnosis not present

## 2018-06-17 DIAGNOSIS — R399 Unspecified symptoms and signs involving the genitourinary system: Secondary | ICD-10-CM | POA: Diagnosis not present

## 2018-06-17 DIAGNOSIS — I480 Paroxysmal atrial fibrillation: Secondary | ICD-10-CM | POA: Diagnosis not present

## 2018-06-17 DIAGNOSIS — R8290 Unspecified abnormal findings in urine: Secondary | ICD-10-CM | POA: Diagnosis not present

## 2018-06-17 DIAGNOSIS — E876 Hypokalemia: Secondary | ICD-10-CM | POA: Diagnosis not present

## 2018-06-26 DIAGNOSIS — E890 Postprocedural hypothyroidism: Secondary | ICD-10-CM | POA: Diagnosis not present

## 2018-06-26 DIAGNOSIS — G4733 Obstructive sleep apnea (adult) (pediatric): Secondary | ICD-10-CM | POA: Diagnosis not present

## 2018-06-26 DIAGNOSIS — R569 Unspecified convulsions: Secondary | ICD-10-CM | POA: Diagnosis not present

## 2018-06-26 DIAGNOSIS — G935 Compression of brain: Secondary | ICD-10-CM | POA: Diagnosis not present

## 2018-06-26 DIAGNOSIS — N183 Chronic kidney disease, stage 3 (moderate): Secondary | ICD-10-CM | POA: Diagnosis not present

## 2018-06-26 DIAGNOSIS — I480 Paroxysmal atrial fibrillation: Secondary | ICD-10-CM | POA: Diagnosis not present

## 2018-06-26 DIAGNOSIS — I10 Essential (primary) hypertension: Secondary | ICD-10-CM | POA: Diagnosis not present

## 2018-06-26 DIAGNOSIS — Z0000 Encounter for general adult medical examination without abnormal findings: Secondary | ICD-10-CM | POA: Diagnosis not present

## 2018-06-26 DIAGNOSIS — R911 Solitary pulmonary nodule: Secondary | ICD-10-CM | POA: Diagnosis not present

## 2018-07-08 DIAGNOSIS — R69 Illness, unspecified: Secondary | ICD-10-CM | POA: Diagnosis not present

## 2018-07-15 DIAGNOSIS — I4891 Unspecified atrial fibrillation: Secondary | ICD-10-CM | POA: Diagnosis not present

## 2018-07-15 DIAGNOSIS — R0602 Shortness of breath: Secondary | ICD-10-CM | POA: Diagnosis not present

## 2018-07-15 DIAGNOSIS — Z85850 Personal history of malignant neoplasm of thyroid: Secondary | ICD-10-CM | POA: Diagnosis not present

## 2018-07-15 DIAGNOSIS — E890 Postprocedural hypothyroidism: Secondary | ICD-10-CM | POA: Diagnosis not present

## 2018-07-15 DIAGNOSIS — I10 Essential (primary) hypertension: Secondary | ICD-10-CM | POA: Diagnosis not present

## 2018-07-15 DIAGNOSIS — I2510 Atherosclerotic heart disease of native coronary artery without angina pectoris: Secondary | ICD-10-CM | POA: Diagnosis not present

## 2018-07-15 DIAGNOSIS — E785 Hyperlipidemia, unspecified: Secondary | ICD-10-CM | POA: Diagnosis not present

## 2018-07-15 DIAGNOSIS — E892 Postprocedural hypoparathyroidism: Secondary | ICD-10-CM | POA: Diagnosis not present

## 2018-07-17 DIAGNOSIS — I4891 Unspecified atrial fibrillation: Secondary | ICD-10-CM | POA: Diagnosis not present

## 2018-07-22 DIAGNOSIS — E890 Postprocedural hypothyroidism: Secondary | ICD-10-CM | POA: Diagnosis not present

## 2018-07-22 DIAGNOSIS — E892 Postprocedural hypoparathyroidism: Secondary | ICD-10-CM | POA: Diagnosis not present

## 2018-07-22 DIAGNOSIS — Z85850 Personal history of malignant neoplasm of thyroid: Secondary | ICD-10-CM | POA: Diagnosis not present

## 2018-09-08 DIAGNOSIS — J4520 Mild intermittent asthma, uncomplicated: Secondary | ICD-10-CM | POA: Diagnosis not present

## 2018-09-08 DIAGNOSIS — R911 Solitary pulmonary nodule: Secondary | ICD-10-CM | POA: Diagnosis not present

## 2018-09-08 DIAGNOSIS — G4733 Obstructive sleep apnea (adult) (pediatric): Secondary | ICD-10-CM | POA: Diagnosis not present

## 2018-09-30 ENCOUNTER — Ambulatory Visit
Admission: RE | Admit: 2018-09-30 | Discharge: 2018-09-30 | Disposition: A | Discharge status: Home / Self Care | Payer: Medicare HMO | Source: Ambulatory Visit | Attending: Internal Medicine | Admitting: Internal Medicine

## 2018-09-30 ENCOUNTER — Other Ambulatory Visit: Payer: Self-pay

## 2018-09-30 DIAGNOSIS — R918 Other nonspecific abnormal finding of lung field: Secondary | ICD-10-CM | POA: Diagnosis not present

## 2018-10-02 ENCOUNTER — Telehealth: Payer: Self-pay | Admitting: Internal Medicine

## 2018-10-03 ENCOUNTER — Inpatient Hospital Stay: Payer: Medicare HMO | Attending: Internal Medicine | Admitting: Internal Medicine

## 2018-10-03 ENCOUNTER — Telehealth: Payer: Self-pay | Admitting: *Deleted

## 2018-10-03 ENCOUNTER — Inpatient Hospital Stay: Payer: Medicare HMO

## 2018-10-03 ENCOUNTER — Other Ambulatory Visit: Payer: Self-pay

## 2018-10-03 DIAGNOSIS — N183 Chronic kidney disease, stage 3 (moderate): Secondary | ICD-10-CM

## 2018-10-03 DIAGNOSIS — Z7901 Long term (current) use of anticoagulants: Secondary | ICD-10-CM | POA: Insufficient documentation

## 2018-10-03 DIAGNOSIS — I4891 Unspecified atrial fibrillation: Secondary | ICD-10-CM | POA: Diagnosis not present

## 2018-10-03 DIAGNOSIS — R918 Other nonspecific abnormal finding of lung field: Secondary | ICD-10-CM | POA: Diagnosis not present

## 2018-10-03 DIAGNOSIS — R911 Solitary pulmonary nodule: Secondary | ICD-10-CM

## 2018-10-03 DIAGNOSIS — Z85850 Personal history of malignant neoplasm of thyroid: Secondary | ICD-10-CM | POA: Diagnosis not present

## 2018-10-03 DIAGNOSIS — D631 Anemia in chronic kidney disease: Secondary | ICD-10-CM | POA: Diagnosis not present

## 2018-10-03 LAB — CBC WITH DIFFERENTIAL/PLATELET
Abs Immature Granulocytes: 0.03 10*3/uL (ref 0.00–0.07)
Basophils Absolute: 0 10*3/uL (ref 0.0–0.1)
Basophils Relative: 0 %
Eosinophils Absolute: 0.2 10*3/uL (ref 0.0–0.5)
Eosinophils Relative: 2 %
HCT: 37.2 % (ref 36.0–46.0)
Hemoglobin: 11.6 g/dL — ABNORMAL LOW (ref 12.0–15.0)
Immature Granulocytes: 0 %
Lymphocytes Relative: 22 %
Lymphs Abs: 1.8 10*3/uL (ref 0.7–4.0)
MCH: 27.6 pg (ref 26.0–34.0)
MCHC: 31.2 g/dL (ref 30.0–36.0)
MCV: 88.4 fL (ref 80.0–100.0)
Monocytes Absolute: 0.5 10*3/uL (ref 0.1–1.0)
Monocytes Relative: 7 %
Neutro Abs: 5.4 10*3/uL (ref 1.7–7.7)
Neutrophils Relative %: 69 %
Platelets: 230 10*3/uL (ref 150–400)
RBC: 4.21 MIL/uL (ref 3.87–5.11)
RDW: 14.7 % (ref 11.5–15.5)
WBC: 7.9 10*3/uL (ref 4.0–10.5)
nRBC: 0 % (ref 0.0–0.2)

## 2018-10-03 LAB — COMPREHENSIVE METABOLIC PANEL
ALT: 26 U/L (ref 0–44)
AST: 22 U/L (ref 15–41)
Albumin: 4.1 g/dL (ref 3.5–5.0)
Alkaline Phosphatase: 97 U/L (ref 38–126)
Anion gap: 9 (ref 5–15)
BUN: 27 mg/dL — ABNORMAL HIGH (ref 8–23)
CO2: 27 mmol/L (ref 22–32)
Calcium: 10.4 mg/dL — ABNORMAL HIGH (ref 8.9–10.3)
Chloride: 108 mmol/L (ref 98–111)
Creatinine, Ser: 1.51 mg/dL — ABNORMAL HIGH (ref 0.44–1.00)
GFR calc Af Amer: 42 mL/min — ABNORMAL LOW (ref >60)
GFR calc non Af Amer: 37 mL/min — ABNORMAL LOW (ref >60)
Glucose, Bld: 130 mg/dL — ABNORMAL HIGH (ref 70–99)
Potassium: 3.4 mmol/L — ABNORMAL LOW (ref 3.5–5.1)
Sodium: 144 mmol/L (ref 135–145)
Total Bilirubin: 0.7 mg/dL (ref 0.3–1.2)
Total Protein: 6.9 g/dL (ref 6.5–8.1)

## 2018-10-03 NOTE — Assessment & Plan Note (Addendum)
#   Multiple lung nodules/groudn glass opacities [at least since 2015]; May 2020 CT scan shows slight progression of the bilateral lung nodules by few millimeters [compared to scan November 2019].  #Again discussed with concerns that this is likely slow-growing malignancy like adenocarcinoma.  Discussed options between surveillance versus biopsy.  We will review at the tumor conference; then inform patient of the recommendations.  # Anemia/CKD-stage III; hemoglobin around 11.6-clinically stable.  Continue p.o. iron.  # Elevated calcium-10.4.  Improved compared to 6 months ago at 10.8.  Monitor closely.   # Thyroid cancer-s/p RAIU [2000]- stable/ no recurrence.Tumor makers-stable.  # A.fib/eliquis [Dr.Khan]- off amiodarone [may 2019]; lung lesions unlikely related to amiodarone.  #  DISPOSITION: Will call regarding tumor conference recommendations. # follow up in 6 months-MD /labs-cbc/cmp;CT prior-Dr.B.  Cc; Dr.Solum/ Hande/ Khan/ Raul Del

## 2018-10-03 NOTE — Progress Notes (Signed)
I connected with Brenda Barber on 09/30/2018 at  3:00 PM EDT by telephone visit and verified that I am speaking with the correct person using two identifiers.  I discussed the limitations, risks, security and privacy concerns of performing an evaluation and management service by telemedicine and the availability of in-person appointments. I also discussed with the patient that there may be a patient responsible charge related to this service. The patient expressed understanding and agreed to proceed.    Other persons participating in the visit and their role in the encounter: none  Patient's location: home  Provider's location: home   Oncology History   # Lung nodules/GOO Multiple [2015; Dr.Oaks].  # LLL nodule 8-9mm/ and bil GGO  [May 219- Off amiodarone; Dr.Khan;cards].   # Thyroid cancer [2000; [facility]] s/p RAI; post-surgical hypothyroidism/ post-surgical hypoparathyroidism.s/p  total thyroidectomy and was found to have a 6 cm follicular thyroid cancer without any local invasion. This was a [id number] follicular thyroid cancer, stage I. S/p radioiodine (115 mCi) and subsequently in 2005, 2006, and 2008 had serially negative thyrogen-stimulated whole body scans. f/u- Dr.Solum.   # CKD/ stage III; ? COPD/asthma [Dr.Fleming]; Afib [on eliquis; Dr.Khan]         Thyroid cancer [facility])     Chief Complaint: Multiple lung nodules   History of present illness:Brenda Barber 63 y.o.  female with history of multiple bilateral lung nodules currently surveillance.  Patient denies any worsening shortness of breath or cough.  Denies any new onset of bone pain.  No headaches.  No nausea no vomiting.  Observation/objective: Hemoglobin 11.6 calcium 10.8; creatinine 1.5  Assessment and plan: Multiple lung nodules # Multiple lung nodules/groudn glass opacities [at least since 2015]; May 2020 CT scan shows slight progression of the bilateral lung nodules by few millimeters [compared to scan November  2019].  #Again discussed with concerns that this is likely slow-growing malignancy like adenocarcinoma.  Discussed options between surveillance versus biopsy.  We will review at the tumor conference; then inform patient of the recommendations.  # Anemia/CKD-stage III; hemoglobin around 11.6-clinically stable.  Continue p.o. iron.  # Elevated calcium-10.4.  Improved compared to 6 months ago at 10.8.  Monitor closely.   # Thyroid cancer-s/p RAIU [2000]- stable/ no recurrence.Tumor makers-stable.  # A.fib/eliquis [Dr.Khan]- off amiodarone [may 2019]; lung lesions unlikely related to amiodarone.  #  DISPOSITION: Will call regarding tumor conference recommendations. # follow up in 6 months-MD /labs-cbc/cmp;CT prior-Dr.B.  Cc; Dr.Solum/ Hande/ Khan/ Raul Del  Follow-up instructions:  I discussed the assessment and treatment plan with the patient.  The patient was provided an opportunity to ask questions and all were answered.  The patient agreed with the plan and demonstrated understanding of instructions.  The patient was advised to call back or seek an in person evaluation if the symptoms worsen or if the condition fails to improve as anticipated.  I provided 12 minutes of non face-to-face telephone visit time during this encounter, and > 50% was spent counseling as documented under my assessment & plan.  Dr. Charlaine Dalton [place] at [facility] 10/07/2018 2:07 PM

## 2018-10-03 NOTE — Telephone Encounter (Signed)
Patient called asking if Dr B is going to do a biopsy on her nodule and if the CT scan is going to be ordered for 6 months. Please return her call [phone]

## 2018-10-06 NOTE — Addendum Note (Signed)
Addended by: Telford Nab on: 10/06/2018 03:56 PM   Modules accepted: Orders

## 2018-10-06 NOTE — Telephone Encounter (Signed)
Brenda Barber- please inform pt that we will discuss her case at the tumor conference this week; and then inform pt of the plan.  Thanks, GB

## 2018-10-06 NOTE — Telephone Encounter (Signed)
Pt has been made aware. Order entered to add pt to tumor conference this week.

## 2018-10-09 ENCOUNTER — Encounter: Payer: Self-pay | Admitting: *Deleted

## 2018-10-09 ENCOUNTER — Other Ambulatory Visit: Payer: Medicare HMO

## 2018-10-09 NOTE — Progress Notes (Signed)
Tumor Board Documentation  SHAQUASIA CAPONIGRO was presented by Dr Rogue Bussing at our Tumor Board on 10/09/2018, which included representatives from medical oncology, pathology, radiation oncology, radiology, surgical, surgical oncology, navigation, internal medicine, research, palliative care, pulmonology.  M[place]inz[place] currently presents as a current patient, for discussion, for new tumor(s) with history of the following treatments: active survellience.  Additionally, we reviewed previous medical and familial history, history of present illness, and recent lab results along with all available histopathologic and imaging studies. The tumor board considered available treatment options and made the following recommendations: Biopsy Refer to her Pulmanologist at [facility], if they are unable to do biopsy, will refer to Dr Duwayne Heck for Navigational Bronchoscopic biopsy  The following procedures/referrals were also placed: No orders of the defined types were placed in this encounter.   Clinical Trial Status: not discussed   Staging used:    National site-specific guidelines   were discussed with respect to the case.  Tumor board is a meeting of clinicians from various specialty areas who evaluate and discuss patients for whom a multidisciplinary approach is being considered. Final determinations in the plan of care are those of the provider(s). The responsibility for follow up of recommendations given during tumor board is that of the provider.   Today's extended care, comprehensive team conference, Vernice was not present for the discussion and was not examined.   Multidisciplinary Tumor Board is a multidisciplinary case peer review process.  Decisions discussed in the Multidisciplinary Tumor Board reflect the opinions of the specialists present at the conference without having examined the patient.  Ultimately, treatment and diagnostic decisions rest with the primary provider(s) and the patient.

## 2018-10-09 NOTE — Progress Notes (Signed)
  Oncology Nurse Navigator Documentation  Navigator Location: [facility] (10/09/18 1500)   )Navigator Encounter Type: Telephone (10/09/18 1500) Telephone: Lahoma Crocker Call (10/09/18 1500) Abnormal Finding Date: 10/01/18 (10/09/18 1500)                     Barriers/Navigation Needs: Coordination of Care (10/09/18 1500)   Interventions: Coordination of Care;Referrals (10/09/18 1500)   Coordination of Care: Appts;Broncoscopy (10/09/18 1500)        Acuity: Level 2 (10/09/18 1500)   Acuity Level 2: Initial guidance, education and coordination as needed;Educational needs;Assistance expediting appointments (10/09/18 1500)  Called pt to review recommendations received from tumor board on 5/21. Pt informed that will need navigational bronchoscopy of the left lung nodule. Informed pt that we have left message with Dr. Gust Brooms office to inform of recommendations and if his office would be able to do the bronchoscopy or if we could refer pt to Dr. Patsey Berthold at [facility] who agreed to see the patient. Informed pt to expect phone call with appt to see Dr. Patsey Berthold if okay with Dr. Raul Del to discuss bronchoscopy with her. Informed pt that she will follow up a few days after biopsy with Dr. Rogue Bussing to review results and discuss treatment options. Contact info given and instructed to call back with any further questions or needs. Pt verbalized understanding.    Time Spent with Patient: 45 (10/09/18 1500)

## 2018-10-14 DIAGNOSIS — R0602 Shortness of breath: Secondary | ICD-10-CM | POA: Diagnosis not present

## 2018-10-14 DIAGNOSIS — I2510 Atherosclerotic heart disease of native coronary artery without angina pectoris: Secondary | ICD-10-CM | POA: Diagnosis not present

## 2018-10-14 DIAGNOSIS — I4891 Unspecified atrial fibrillation: Secondary | ICD-10-CM | POA: Diagnosis not present

## 2018-10-14 DIAGNOSIS — E785 Hyperlipidemia, unspecified: Secondary | ICD-10-CM | POA: Diagnosis not present

## 2018-10-14 DIAGNOSIS — I340 Nonrheumatic mitral (valve) insufficiency: Secondary | ICD-10-CM | POA: Diagnosis not present

## 2018-10-14 DIAGNOSIS — I10 Essential (primary) hypertension: Secondary | ICD-10-CM | POA: Diagnosis not present

## 2018-10-14 NOTE — Addendum Note (Signed)
Addended by: Telford Nab on: 10/14/2018 08:20 AM   Modules accepted: Orders

## 2018-10-14 NOTE — Telephone Encounter (Signed)
Message left from [place] at [facility] pulmonary that Dr. Lanney Gins reviewed imaging and agreed with plan for pt to have navigational bronchoscopy. Referral to Dr. Patsey Berthold entered and their office will contact patient with appt.

## 2018-10-15 ENCOUNTER — Telehealth: Payer: Self-pay

## 2018-10-15 NOTE — Telephone Encounter (Signed)
Called patient for COVID-19 pre-screening for in office visit.  Have you recently traveled any where out of the local area in the last 2 weeks? no  Have you been in close contact with a person diagnosed with COVID-19 within the last 2 weeks? no  Do you currently have any of the following symptoms? If so, when did they start? Cough     Diarrhea   Joint Pain Fever      Muscle Pain   Red eyes Shortness of breath   Abdominal pain  Vomiting Loss of smell    Rash    Sore Throat Headache    Weakness   Bruising or bleeding   Okay to proceed with visit.

## 2018-10-16 ENCOUNTER — Encounter: Payer: Self-pay | Admitting: Pulmonary Disease

## 2018-10-16 ENCOUNTER — Other Ambulatory Visit: Payer: Self-pay

## 2018-10-16 ENCOUNTER — Telehealth: Payer: Self-pay

## 2018-10-16 ENCOUNTER — Ambulatory Visit (INDEPENDENT_AMBULATORY_CARE_PROVIDER_SITE_OTHER): Payer: Medicare HMO | Admitting: Pulmonary Disease

## 2018-10-16 VITALS — BP 150/82 | HR 80

## 2018-10-16 DIAGNOSIS — Z85850 Personal history of malignant neoplasm of thyroid: Secondary | ICD-10-CM | POA: Diagnosis not present

## 2018-10-16 DIAGNOSIS — G4733 Obstructive sleep apnea (adult) (pediatric): Secondary | ICD-10-CM

## 2018-10-16 DIAGNOSIS — R918 Other nonspecific abnormal finding of lung field: Secondary | ICD-10-CM

## 2018-10-16 DIAGNOSIS — Z9989 Dependence on other enabling machines and devices: Secondary | ICD-10-CM | POA: Diagnosis not present

## 2018-10-16 DIAGNOSIS — I480 Paroxysmal atrial fibrillation: Secondary | ICD-10-CM

## 2018-10-16 NOTE — H&P (View-Only) (Signed)
Subjective:    Patient ID: Brenda Barber, female    DOB: [birth year], 63 y.o.   MRN: [id number]  HPI The patient is a complex 63 year old female with a history of thyroid cancer dating back to 2000.  She underwent total thyroidectomy and was found to have a 6 cm follicular thyroid cancer without local invasion at that time.  The tumor was a T3 N0 MX follicular thyroid cancer stage I.  She was treated with postoperative radioiodine (115 Ci) and was noted to have serially negative Thyrogen stimulating whole-body scans in 2005, 2006 and 2008.  Postsurgically she has had hypothyroidism and hypoparathyroidism.  She has been noted to have pulmonary nodules since around 2008.  Some of these are groundglass opacities.  On 12 May she underwent CT chest without contrast which showed the multiple bilateral upper lung predominant groundglass nodules.  The index left apical nodule was noted to be 1.5 cm compared to 1.2 on the prior exam of November 2019.  Likewise there was an index left lower lobe solid nodule that measured 9 mm and previously had measured 7 mm.  All of the other multiple nodules appear to be stable.  Because of the enlarging nature of these nodules he was presented at tumor board on 21 May.  The consensus was that these nodules should be investigated to determine whether they are related to the patient's prior thyroid cancer versus new primary.  The patient presents today for evaluation of these.  She is kindly referred by Dr.Brahmanday.  The patient follows from the pulmonary standpoint with Dr. Wallene Huh.  He has been following her for asthma.  She has not had recent flare due to asthma.  She is well controlled on Advair and as needed albuterol.  She is referred to [place] strictly for navigational bronchoscopy.  The patient does not endorse any fevers, chills or sweats.  She does have issues with frequent falls due to "being clumsy".  She has a very extensive past medical history that has been  reviewed and is as noted.  Likewise her prior surgical, family and social history have been reviewed.  She is a lifelong never smoker.  She has never had issues with general anesthesia.  She is on apixaban for atrial fibrillation.  She has been instructed that she will have to stop this medication for at least 72 hours prior to the procedure.  She has issues with obstructive sleep apnea for which she uses CPAP.  These issues will be taken into consideration with regards to general anesthesia.   Review of Systems  Constitutional: Negative.   HENT: Positive for tinnitus.        Wears hearing aids.  Eyes: Negative.   Respiratory: Negative.   Cardiovascular: Positive for leg swelling (Occasional ankle swelling.).  Gastrointestinal: Negative.   Endocrine: Positive for cold intolerance.  Genitourinary: Negative.   Musculoskeletal: Positive for gait problem.  Skin: Negative.   Neurological: Positive for weakness (Left lower extremity, chronic).  Hematological: Negative.   Psychiatric/Behavioral: Negative.   All other systems reviewed and are negative.      Objective:   Physical Exam Vitals signs and nursing note reviewed.  Constitutional:      Appearance: She is normal weight.  HENT:     Head: Normocephalic.     Ears:     Comments: Wears hearing aids.    Nose:     Comments: Nose/mouth/throat Limited exam due to masking requirements for COVID-19. Eyes:     General:  No scleral icterus.    Conjunctiva/sclera: Conjunctivae normal.     Pupils: Pupils are equal, round, and reactive to light.  Neck:     Musculoskeletal: Neck supple.     Trachea: Trachea and phonation normal.     Comments: Status post thyroidectomy Cardiovascular:     Rate and Rhythm: Normal rate and regular rhythm.     Pulses: Normal pulses.     Heart sounds: No murmur.  Pulmonary:     Effort: Pulmonary effort is normal.     Breath sounds: Normal breath sounds.  Abdominal:     General: There is no distension.      Palpations: Abdomen is soft.  Musculoskeletal: Normal range of motion.     Right lower leg: No edema.     Left lower leg: No edema.  Skin:    General: Skin is warm and dry.  Neurological:     General: No focal deficit present.     Mental Status: She is alert and oriented to person, place, and time.  Psychiatric:        Mood and Affect: Mood normal.        Behavior: Behavior normal.        Thought Content: Thought content normal.           CT scan independently reviewed.  Patient has an enlarging left upper lobe groundglass opacity as noted on the first film above and enlarging left lower lobe solid nodule as noted on the film below.    Assessment & Plan:   1.  Multiple lung nodules some groundglass and some solid: The patient has had intermittent growth from 1 surveillance CT to the next.  This is concerning for malignancy.  Given her history of prior carcinoma of the thyroid biopsy for diagnosis is recommended.  The best method in this case is bronchoscopy with navigational assistance.  The patient understands that a CT scan will have to be repeated to developed a navigational plan for the nodules.  The patient understands the potential benefits, limitations and complications of the procedure and agrees to go ahead.  We discussed need to withhold anticoagulants for at least 72 hours prior to the procedure.  Given that no adenopathy was noted on CT at this juncture, endobronchial ultrasound will not be performed.  Procedure date and time TBA.  2.  History of thyroid cancer: This issue adds complexity to her management.  Implication as noted above.   3.  Paroxysmal atrial fibrillation on apixaban: The patient will have to withhold apixaban for at least 72 hours prior to the procedure.  4.  Obstructive sleep apnea on CPAP: Patient is well compensated in this regard.  It will be something to note and take into account post procedurally.    Thank you for allowing me to participate in  this patient's care.

## 2018-10-16 NOTE — Telephone Encounter (Signed)
Bronchoscopy planned for 10/24/18.  CPT: [id number] DX: left lung nodules with history of thyroid cancer Plan for ENB

## 2018-10-16 NOTE — Patient Instructions (Signed)
Plan for bronchoscopy.

## 2018-10-16 NOTE — Progress Notes (Signed)
Subjective:    Patient ID: Brenda Barber, female    DOB: 1955/07/16, 63 y.o.   MRN: [id number]  HPI The patient is a complex 63 year old female with a history of thyroid cancer dating back to 2000.  She underwent total thyroidectomy and was found to have a 6 cm follicular thyroid cancer without local invasion at that time.  The tumor was a T3 N0 MX follicular thyroid cancer stage I.  She was treated with postoperative radioiodine (115 Ci) and was noted to have serially negative Thyrogen stimulating whole-body scans in 2005, 2006 and 2008.  Postsurgically she has had hypothyroidism and hypoparathyroidism.  She has been noted to have pulmonary nodules since around 2008.  Some of these are groundglass opacities.  On 12 May she underwent CT chest without contrast which showed the multiple bilateral upper lung predominant groundglass nodules.  The index left apical nodule was noted to be 1.5 cm compared to 1.2 on the prior exam of November 2019.  Likewise there was an index left lower lobe solid nodule that measured 9 mm and previously had measured 7 mm.  All of the other multiple nodules appear to be stable.  Because of the enlarging nature of these nodules he was presented at tumor board on 21 May.  The consensus was that these nodules should be investigated to determine whether they are related to the patient's prior thyroid cancer versus new primary.  The patient presents today for evaluation of these.  She is kindly referred by Dr.Brahmanday.  The patient follows from the pulmonary standpoint with Dr. Wallene Huh.  He has been following her for asthma.  She has not had recent flare due to asthma.  She is well controlled on Advair and as needed albuterol.  She is referred to [place] strictly for navigational bronchoscopy.  The patient does not endorse any fevers, chills or sweats.  She does have issues with frequent falls due to "being clumsy".  She has a very extensive past medical history that has been  reviewed and is as noted.  Likewise her prior surgical, family and social history have been reviewed.  She is a lifelong never smoker.  She has never had issues with general anesthesia.  She is on apixaban for atrial fibrillation.  She has been instructed that she will have to stop this medication for at least 72 hours prior to the procedure.  She has issues with obstructive sleep apnea for which she uses CPAP.  These issues will be taken into consideration with regards to general anesthesia.   Review of Systems  Constitutional: Negative.   HENT: Positive for tinnitus.        Wears hearing aids.  Eyes: Negative.   Respiratory: Negative.   Cardiovascular: Positive for leg swelling (Occasional ankle swelling.).  Gastrointestinal: Negative.   Endocrine: Positive for cold intolerance.  Genitourinary: Negative.   Musculoskeletal: Positive for gait problem.  Skin: Negative.   Neurological: Positive for weakness (Left lower extremity, chronic).  Hematological: Negative.   Psychiatric/Behavioral: Negative.   All other systems reviewed and are negative.      Objective:   Physical Exam Vitals signs and nursing note reviewed.  Constitutional:      Appearance: She is normal weight.  HENT:     Head: Normocephalic.     Ears:     Comments: Wears hearing aids.    Nose:     Comments: Nose/mouth/throat Limited exam due to masking requirements for COVID-19. Eyes:     General:  No scleral icterus.    Conjunctiva/sclera: Conjunctivae normal.     Pupils: Pupils are equal, round, and reactive to light.  Neck:     Musculoskeletal: Neck supple.     Trachea: Trachea and phonation normal.     Comments: Status post thyroidectomy Cardiovascular:     Rate and Rhythm: Normal rate and regular rhythm.     Pulses: Normal pulses.     Heart sounds: No murmur.  Pulmonary:     Effort: Pulmonary effort is normal.     Breath sounds: Normal breath sounds.  Abdominal:     General: There is no distension.      Palpations: Abdomen is soft.  Musculoskeletal: Normal range of motion.     Right lower leg: No edema.     Left lower leg: No edema.  Skin:    General: Skin is warm and dry.  Neurological:     General: No focal deficit present.     Mental Status: She is alert and oriented to person, place, and time.  Psychiatric:        Mood and Affect: Mood normal.        Behavior: Behavior normal.        Thought Content: Thought content normal.           CT scan independently reviewed.  Patient has an enlarging left upper lobe groundglass opacity as noted on the first film above and enlarging left lower lobe solid nodule as noted on the film below.    Assessment & Plan:   1.  Multiple lung nodules some groundglass and some solid: The patient has had intermittent growth from 1 surveillance CT to the next.  This is concerning for malignancy.  Given her history of prior carcinoma of the thyroid biopsy for diagnosis is recommended.  The best method in this case is bronchoscopy with navigational assistance.  The patient understands that a CT scan will have to be repeated to developed a navigational plan for the nodules.  The patient understands the potential benefits, limitations and complications of the procedure and agrees to go ahead.  We discussed need to withhold anticoagulants for at least 72 hours prior to the procedure.  Given that no adenopathy was noted on CT at this juncture, endobronchial ultrasound will not be performed.  Procedure date and time TBA.  2.  History of thyroid cancer: This issue adds complexity to her management.  Implication as noted above.   3.  Paroxysmal atrial fibrillation on apixaban: The patient will have to withhold apixaban for at least 72 hours prior to the procedure.  4.  Obstructive sleep apnea on CPAP: Patient is well compensated in this regard.  It will be something to note and take into account post procedurally.    Thank you for allowing me to participate in  this patient's care.

## 2018-10-17 NOTE — Telephone Encounter (Signed)
Super d scheduled for 10/20/18@8am @[facility] Erasmo Downer will notify pt of these appt's Joellen Jersey

## 2018-10-17 NOTE — Telephone Encounter (Signed)
Patient aware of all dates and times for bronchoscopy, super D and preop apt.

## 2018-10-20 ENCOUNTER — Ambulatory Visit
Admission: RE | Admit: 2018-10-20 | Discharge: 2018-10-20 | Disposition: A | Discharge status: Home / Self Care | Payer: Medicare HMO | Source: Ambulatory Visit | Attending: Pulmonary Disease | Admitting: Pulmonary Disease

## 2018-10-20 ENCOUNTER — Other Ambulatory Visit: Payer: Self-pay

## 2018-10-20 DIAGNOSIS — R569 Unspecified convulsions: Secondary | ICD-10-CM | POA: Diagnosis not present

## 2018-10-20 DIAGNOSIS — I10 Essential (primary) hypertension: Secondary | ICD-10-CM | POA: Diagnosis not present

## 2018-10-20 DIAGNOSIS — R918 Other nonspecific abnormal finding of lung field: Secondary | ICD-10-CM | POA: Insufficient documentation

## 2018-10-20 DIAGNOSIS — E890 Postprocedural hypothyroidism: Secondary | ICD-10-CM | POA: Diagnosis not present

## 2018-10-20 DIAGNOSIS — N183 Chronic kidney disease, stage 3 (moderate): Secondary | ICD-10-CM | POA: Diagnosis not present

## 2018-10-20 DIAGNOSIS — I480 Paroxysmal atrial fibrillation: Secondary | ICD-10-CM | POA: Diagnosis not present

## 2018-10-20 DIAGNOSIS — G935 Compression of brain: Secondary | ICD-10-CM | POA: Diagnosis not present

## 2018-10-20 DIAGNOSIS — G4733 Obstructive sleep apnea (adult) (pediatric): Secondary | ICD-10-CM | POA: Diagnosis not present

## 2018-10-20 DIAGNOSIS — R911 Solitary pulmonary nodule: Secondary | ICD-10-CM | POA: Diagnosis not present

## 2018-10-21 ENCOUNTER — Encounter
Admission: RE | Admit: 2018-10-21 | Discharge: 2018-10-21 | Disposition: A | Discharge status: Home / Self Care | Payer: Medicare HMO | Source: Ambulatory Visit | Attending: Pulmonary Disease | Admitting: Pulmonary Disease

## 2018-10-21 ENCOUNTER — Other Ambulatory Visit: Payer: Self-pay

## 2018-10-21 DIAGNOSIS — Z85850 Personal history of malignant neoplasm of thyroid: Secondary | ICD-10-CM | POA: Diagnosis not present

## 2018-10-21 DIAGNOSIS — N183 Chronic kidney disease, stage 3 (moderate): Secondary | ICD-10-CM | POA: Diagnosis not present

## 2018-10-21 DIAGNOSIS — K219 Gastro-esophageal reflux disease without esophagitis: Secondary | ICD-10-CM | POA: Diagnosis not present

## 2018-10-21 DIAGNOSIS — Z1159 Encounter for screening for other viral diseases: Secondary | ICD-10-CM | POA: Insufficient documentation

## 2018-10-21 DIAGNOSIS — I2510 Atherosclerotic heart disease of native coronary artery without angina pectoris: Secondary | ICD-10-CM | POA: Diagnosis not present

## 2018-10-21 DIAGNOSIS — Z8673 Personal history of transient ischemic attack (TIA), and cerebral infarction without residual deficits: Secondary | ICD-10-CM | POA: Diagnosis not present

## 2018-10-21 DIAGNOSIS — F329 Major depressive disorder, single episode, unspecified: Secondary | ICD-10-CM | POA: Diagnosis not present

## 2018-10-21 DIAGNOSIS — Z7901 Long term (current) use of anticoagulants: Secondary | ICD-10-CM | POA: Diagnosis not present

## 2018-10-21 DIAGNOSIS — J45909 Unspecified asthma, uncomplicated: Secondary | ICD-10-CM | POA: Diagnosis not present

## 2018-10-21 DIAGNOSIS — G4733 Obstructive sleep apnea (adult) (pediatric): Secondary | ICD-10-CM | POA: Diagnosis not present

## 2018-10-21 DIAGNOSIS — Z01812 Encounter for preprocedural laboratory examination: Secondary | ICD-10-CM | POA: Insufficient documentation

## 2018-10-21 DIAGNOSIS — I480 Paroxysmal atrial fibrillation: Secondary | ICD-10-CM | POA: Diagnosis not present

## 2018-10-21 DIAGNOSIS — Z79890 Hormone replacement therapy (postmenopausal): Secondary | ICD-10-CM | POA: Diagnosis not present

## 2018-10-21 DIAGNOSIS — E890 Postprocedural hypothyroidism: Secondary | ICD-10-CM | POA: Diagnosis not present

## 2018-10-21 DIAGNOSIS — Z7951 Long term (current) use of inhaled steroids: Secondary | ICD-10-CM | POA: Diagnosis not present

## 2018-10-21 DIAGNOSIS — J9811 Atelectasis: Secondary | ICD-10-CM | POA: Diagnosis not present

## 2018-10-21 DIAGNOSIS — R918 Other nonspecific abnormal finding of lung field: Secondary | ICD-10-CM | POA: Diagnosis not present

## 2018-10-21 DIAGNOSIS — E785 Hyperlipidemia, unspecified: Secondary | ICD-10-CM | POA: Diagnosis not present

## 2018-10-21 HISTORY — DX: Cardiac arrhythmia, unspecified: I49.9

## 2018-10-21 NOTE — Patient Instructions (Signed)
Your procedure is scheduled on: Friday 6/5 Report to Day Surgery. To find out your arrival time please call [phone] between 1PM - 3PM on Thurs. 6/4.  Remember: Instructions that are not followed completely may result in serious medical risk,  up to and including death, or upon the discretion of your surgeon and anesthesiologist your  surgery may need to be rescheduled.     _X__ 1. Do not eat food after midnight the night before your procedure.                 No gum chewing or hard candies. You may drink clear liquids up to 2 hours                 before you are scheduled to arrive for your surgery- DO not drink clear                 liquids within 2 hours of the start of your surgery.                 Clear Liquids include:  water, apple juice without pulp, clear carbohydrate                 drink such as Clearfast of Gatorade, Black Coffee or Tea (Do not add                 anything to coffee or tea).  __X__2.  On the morning of surgery brush your teeth with toothpaste and water, you                may rinse your mouth with mouthwash if you wish.  Do not swallow any toothpaste of mouthwash.     _X__ 3.  No Alcohol for 24 hours before or after surgery.   ___ 4.  Do Not Smoke or use e-cigarettes For 24 Hours Prior to Your Surgery.                 Do not use any chewable tobacco products for at least 6 hours prior to                 surgery.  ____  5.  Bring all medications with you on the day of surgery if instructed.  x ____  6.  Notify your doctor if there is any change in your medical condition      (cold, fever, infections).     Do not wear jewelry, make-up, hairpins, clips or nail polish. Do not wear lotions, powders, or perfumes. You may wear deodorant. Do not shave 48 hours prior to surgery. Men may shave face and neck. Do not bring valuables to the hospital.    [facility] is not responsible for any belongings or valuables.  Contacts, dentures  or bridgework may not be worn into surgery. Leave your suitcase in the car. After surgery it may be brought to your room. For patients admitted to the hospital, discharge time is determined by your treatment team.   Patients discharged the day of surgery will not be allowed to drive home.   Please read over the following fact sheets that you were given:    __x__ Take these medicines the morning of surgery with A SIP OF WATER:    1. pantoprazole (PROTONIX) 40 MG tablet take dose the night before and one the morning of surgery  2. levothyroxine (SYNTHROID, LEVOTHROID) 137 MCG tablet  3. benzonatate (TESSALON) 100 MG capsule if needed  4.sotalol (  BETAPACE) 80 MG tablet  5.sucralfate (CARAFATE) 1 g tablet  6.  ____ Fleet Enema (as directed)   ____ Use CHG Soap as directed  ___x_ Use inhalers on the day of surgery albuterol (PROVENTIL HFA;VENTOLIN HFA) 108 (90 BASE) MCG/ACT inhale and bring it with you.beclomethasone (QVAR) 80 MCG/ACT inhaler,Fluticasone-Salmeterol (ADVAIR) 250-50 MCG/DOSE AEPB  ____ Stop metformin 2 days prior to surgery    ____ Take 1/2 of usual insulin dose the night before surgery. No insulin the morning          of surgery.   __x__ Stopped apixaban (ELIQUIS) 5 MG TABS tablet yesterday 6/1  ____ Stop Anti-inflammatories on    __x__ Stop supplements until after surgery.  vitamin C (ASCORBIC ACID) 500 MG tablet  __x__ Bring C-Pap to the hospital.

## 2018-10-22 ENCOUNTER — Other Ambulatory Visit: Payer: Self-pay

## 2018-10-22 ENCOUNTER — Telehealth: Payer: Self-pay

## 2018-10-22 LAB — NOVEL CORONAVIRUS, NAA (HOSP ORDER, SEND-OUT TO REF LAB; TAT 18-24 HRS): SARS-CoV-2, NAA: NOT DETECTED

## 2018-10-22 NOTE — Telephone Encounter (Signed)
Called patient regarding medication question. Patient stated that she no longer takes the protonix. I have removed it from her list.   I confirmed with her that she stopped her Eliquis for her procedure Friday. Patient states she stopped it June 1, since her roommate helps her with her meds. We had instructed her to stop it June 2. Dr. Patsey Berthold please confirm this is okay .

## 2018-10-22 NOTE — Telephone Encounter (Signed)
This is [facility]

## 2018-10-22 NOTE — Telephone Encounter (Signed)
Spoke to patient, she is aware. Nothing further at this time.

## 2018-10-23 ENCOUNTER — Telehealth: Payer: Self-pay | Admitting: Pulmonary Disease

## 2018-10-23 NOTE — Telephone Encounter (Signed)
Per LG verbally covid-19 test is negative.  Pt is aware and voiced her understanding.  Nothing further is needed.

## 2018-10-23 NOTE — Telephone Encounter (Signed)
Called and spoke to pt, who is requesting covid-19 results.   LG please advise. Thanks

## 2018-10-24 ENCOUNTER — Ambulatory Visit: Payer: Medicare HMO

## 2018-10-24 ENCOUNTER — Encounter: Payer: Self-pay | Admitting: *Deleted

## 2018-10-24 ENCOUNTER — Ambulatory Visit: Payer: Medicare HMO | Admitting: Certified Registered Nurse Anesthetist

## 2018-10-24 ENCOUNTER — Other Ambulatory Visit: Payer: Self-pay

## 2018-10-24 ENCOUNTER — Ambulatory Visit
Admission: RE | Admit: 2018-10-24 | Discharge: 2018-10-24 | Disposition: A | Discharge status: Home / Self Care | Payer: Medicare HMO | Attending: Pulmonary Disease | Admitting: Pulmonary Disease

## 2018-10-24 ENCOUNTER — Ambulatory Visit
Admission: RE | Disposition: A | Discharge status: Home / Self Care | Payer: Self-pay | Source: Home / Self Care | Attending: Pulmonary Disease

## 2018-10-24 DIAGNOSIS — G4733 Obstructive sleep apnea (adult) (pediatric): Secondary | ICD-10-CM | POA: Insufficient documentation

## 2018-10-24 DIAGNOSIS — J45909 Unspecified asthma, uncomplicated: Secondary | ICD-10-CM | POA: Diagnosis not present

## 2018-10-24 DIAGNOSIS — Z8673 Personal history of transient ischemic attack (TIA), and cerebral infarction without residual deficits: Secondary | ICD-10-CM | POA: Insufficient documentation

## 2018-10-24 DIAGNOSIS — E890 Postprocedural hypothyroidism: Secondary | ICD-10-CM | POA: Insufficient documentation

## 2018-10-24 DIAGNOSIS — R918 Other nonspecific abnormal finding of lung field: Secondary | ICD-10-CM

## 2018-10-24 DIAGNOSIS — F329 Major depressive disorder, single episode, unspecified: Secondary | ICD-10-CM | POA: Insufficient documentation

## 2018-10-24 DIAGNOSIS — J9811 Atelectasis: Secondary | ICD-10-CM | POA: Insufficient documentation

## 2018-10-24 DIAGNOSIS — I480 Paroxysmal atrial fibrillation: Secondary | ICD-10-CM | POA: Diagnosis not present

## 2018-10-24 DIAGNOSIS — Z7901 Long term (current) use of anticoagulants: Secondary | ICD-10-CM | POA: Insufficient documentation

## 2018-10-24 DIAGNOSIS — Z1159 Encounter for screening for other viral diseases: Secondary | ICD-10-CM | POA: Diagnosis not present

## 2018-10-24 DIAGNOSIS — E039 Hypothyroidism, unspecified: Secondary | ICD-10-CM | POA: Diagnosis not present

## 2018-10-24 DIAGNOSIS — Z85850 Personal history of malignant neoplasm of thyroid: Secondary | ICD-10-CM | POA: Diagnosis not present

## 2018-10-24 DIAGNOSIS — N183 Chronic kidney disease, stage 3 (moderate): Secondary | ICD-10-CM | POA: Insufficient documentation

## 2018-10-24 DIAGNOSIS — E785 Hyperlipidemia, unspecified: Secondary | ICD-10-CM | POA: Insufficient documentation

## 2018-10-24 DIAGNOSIS — Z79890 Hormone replacement therapy (postmenopausal): Secondary | ICD-10-CM | POA: Insufficient documentation

## 2018-10-24 DIAGNOSIS — Z7951 Long term (current) use of inhaled steroids: Secondary | ICD-10-CM | POA: Insufficient documentation

## 2018-10-24 DIAGNOSIS — K219 Gastro-esophageal reflux disease without esophagitis: Secondary | ICD-10-CM | POA: Insufficient documentation

## 2018-10-24 DIAGNOSIS — R911 Solitary pulmonary nodule: Secondary | ICD-10-CM | POA: Diagnosis not present

## 2018-10-24 DIAGNOSIS — I2510 Atherosclerotic heart disease of native coronary artery without angina pectoris: Secondary | ICD-10-CM | POA: Insufficient documentation

## 2018-10-24 DIAGNOSIS — Z9889 Other specified postprocedural states: Secondary | ICD-10-CM

## 2018-10-24 DIAGNOSIS — I129 Hypertensive chronic kidney disease with stage 1 through stage 4 chronic kidney disease, or unspecified chronic kidney disease: Secondary | ICD-10-CM | POA: Diagnosis not present

## 2018-10-24 HISTORY — PX: ELECTROMAGNETIC NAVIGATION BROCHOSCOPY: SHX5369

## 2018-10-24 SURGERY — ELECTROMAGNETIC NAVIGATION BRONCHOSCOPY
Anesthesia: General | Laterality: Left

## 2018-10-24 MED ORDER — SUGAMMADEX SODIUM 200 MG/2ML IV SOLN
INTRAVENOUS | Status: AC
  Filled 2018-10-24: qty 2

## 2018-10-24 MED ORDER — SODIUM CHLORIDE 0.9 % IV SOLN
Freq: Once | INTRAVENOUS | Status: AC
  Administered 2018-10-24: 13:00:00 via INTRAVENOUS

## 2018-10-24 MED ORDER — BUTAMBEN-TETRACAINE-BENZOCAINE 2-2-14 % EX AERO
1.0000 | INHALATION_SPRAY | Freq: Once | CUTANEOUS | Status: DC
  Filled 2018-10-24: qty 20

## 2018-10-24 MED ORDER — PROMETHAZINE HCL 25 MG/ML IJ SOLN: 6.2500 mg | INTRAMUSCULAR | Status: DC | PRN

## 2018-10-24 MED ORDER — PROPOFOL 10 MG/ML IV BOLUS
INTRAVENOUS | Status: AC
  Filled 2018-10-24: qty 20

## 2018-10-24 MED ORDER — MIDAZOLAM HCL 2 MG/2ML IJ SOLN
INTRAMUSCULAR | Status: AC
  Filled 2018-10-24: qty 2

## 2018-10-24 MED ORDER — LIDOCAINE HCL (CARDIAC) PF 100 MG/5ML IV SOSY
PREFILLED_SYRINGE | INTRAVENOUS | Status: DC | PRN
  Administered 2018-10-24: 80 mg via INTRAVENOUS

## 2018-10-24 MED ORDER — IPRATROPIUM-ALBUTEROL 0.5-2.5 (3) MG/3ML IN SOLN: 3.0000 mL | Freq: Once | RESPIRATORY_TRACT | Status: DC

## 2018-10-24 MED ORDER — DEXAMETHASONE SODIUM PHOSPHATE 4 MG/ML IJ SOLN
INTRAMUSCULAR | Status: AC
  Filled 2018-10-24: qty 2

## 2018-10-24 MED ORDER — DEXAMETHASONE SODIUM PHOSPHATE 10 MG/ML IJ SOLN
INTRAMUSCULAR | Status: DC | PRN
  Administered 2018-10-24: 8 mg via INTRAVENOUS

## 2018-10-24 MED ORDER — MIDAZOLAM HCL 2 MG/2ML IJ SOLN
INTRAMUSCULAR | Status: DC | PRN
  Administered 2018-10-24: 1 mg via INTRAVENOUS

## 2018-10-24 MED ORDER — ROCURONIUM BROMIDE 100 MG/10ML IV SOLN
INTRAVENOUS | Status: DC | PRN
  Administered 2018-10-24: 5 mg via INTRAVENOUS
  Administered 2018-10-24: 30 mg via INTRAVENOUS
  Administered 2018-10-24: 5 mg via INTRAVENOUS

## 2018-10-24 MED ORDER — PROPOFOL 10 MG/ML IV BOLUS
INTRAVENOUS | Status: DC | PRN
  Administered 2018-10-24: 130 mg via INTRAVENOUS

## 2018-10-24 MED ORDER — SUGAMMADEX SODIUM 200 MG/2ML IV SOLN
INTRAVENOUS | Status: DC | PRN
  Administered 2018-10-24: 150 mg via INTRAVENOUS

## 2018-10-24 MED ORDER — FENTANYL CITRATE (PF) 100 MCG/2ML IJ SOLN: 25.0000 ug | INTRAMUSCULAR | Status: DC | PRN

## 2018-10-24 MED ORDER — FENTANYL CITRATE (PF) 100 MCG/2ML IJ SOLN
INTRAMUSCULAR | Status: AC
  Filled 2018-10-24: qty 2

## 2018-10-24 MED ORDER — ONDANSETRON HCL 4 MG/2ML IJ SOLN
INTRAMUSCULAR | Status: DC | PRN
  Administered 2018-10-24: 4 mg via INTRAVENOUS

## 2018-10-24 MED ORDER — ONDANSETRON HCL 4 MG/2ML IJ SOLN
INTRAMUSCULAR | Status: AC
  Filled 2018-10-24: qty 2

## 2018-10-24 MED ORDER — LIDOCAINE HCL (PF) 2 % IJ SOLN
INTRAMUSCULAR | Status: AC
  Filled 2018-10-24: qty 10

## 2018-10-24 MED ORDER — SUCCINYLCHOLINE CHLORIDE 20 MG/ML IJ SOLN
INTRAMUSCULAR | Status: AC
  Filled 2018-10-24: qty 1

## 2018-10-24 MED ORDER — SUCCINYLCHOLINE CHLORIDE 20 MG/ML IJ SOLN
INTRAMUSCULAR | Status: DC | PRN
  Administered 2018-10-24: 80 mg via INTRAVENOUS

## 2018-10-24 MED ORDER — FENTANYL CITRATE (PF) 100 MCG/2ML IJ SOLN
INTRAMUSCULAR | Status: DC | PRN
  Administered 2018-10-24 (×2): 50 ug via INTRAVENOUS

## 2018-10-24 MED ORDER — SODIUM CHLORIDE 0.9 % IV SOLN
INTRAVENOUS | Status: DC | PRN
  Administered 2018-10-24: 13:00:00 via INTRAVENOUS

## 2018-10-24 MED ORDER — ROCURONIUM BROMIDE 50 MG/5ML IV SOLN
INTRAVENOUS | Status: AC
  Filled 2018-10-24: qty 1

## 2018-10-24 NOTE — Discharge Instructions (Signed)
Flexible Bronchoscopy, Care After This sheet gives you information about how to care for yourself after your test. Your doctor may also give you more specific instructions. If you have problems or questions, contact your doctor. Follow these instructions at home: Eating and drinking  Do not eat or drink anything (not even water) for 2 hours after your test, or until your numbing medicine (local anesthetic) wears off.  When your numbness is gone and your cough and gag reflexes have come back, you may: ? Eat only soft foods. ? Slowly drink liquids.  The day after the test, go back to your normal diet. Driving  Do not drive for 24 hours if you were given a medicine to help you relax (sedative).  Do not drive or use heavy machinery while taking prescription pain medicine. General instructions   Take over-the-counter and prescription medicines only as told by your doctor.  Return to your normal activities as told. Ask what activities are safe for you.  Do not use any products that have nicotine or tobacco in them. This includes cigarettes and e-cigarettes. If you need help quitting, ask your doctor.  Keep all follow-up visits as told by your doctor. This is important. It is very important if you had a tissue sample (biopsy) taken. Get help right away if:  You have shortness of breath that gets worse.  You get light-headed.  You feel like you are going to pass out (faint).  You have chest pain.  You cough up: ? More than a little blood. ? More blood than before. Summary  Do not eat or drink anything (not even water) for 2 hours after your test, or until your numbing medicine wears off.  Do not use cigarettes. Do not use e-cigarettes.  Get help right away if you have chest pain. This information is not intended to replace advice given to you by your health care provider. Make sure you discuss any questions you have with your health care provider. Document Released: 03/04/2009  Document Revised: 05/25/2016 Document Reviewed: 05/25/2016 Elsevier Interactive Patient Education  2019 [place]   1) The drugs that you were given will stay in your system until tomorrow so for the next 24 hours you should not:  A) Drive an automobile B) Make any legal decisions C) Drink any alcoholic beverage   2) You may resume regular meals tomorrow.  Today it is better to start with liquids and gradually work up to solid foods.  You may eat anything you prefer, but it is better to start with liquids, then soup and crackers, and gradually work up to solid foods.   3) Please notify your doctor immediately if you have any unusual bleeding, trouble breathing, redness and pain at the surgery site, drainage, fever, or pain not relieved by medication.    4) Additional Instructions: Re start your Eliquis in the morning (6/6)        Please contact your physician with any problems or Same Day Surgery at [phone], Monday through Friday 6 am to 4 pm, or [place] at [facility] number at [phone].

## 2018-10-24 NOTE — Anesthesia Procedure Notes (Signed)
Procedure Name: Intubation Performed by: Demetrius Charity, CRNA Pre-anesthesia Checklist: Patient identified, Patient being monitored, Timeout performed, Emergency Drugs available and Suction available Patient Re-evaluated:Patient Re-evaluated prior to induction Oxygen Delivery Method: Circle system utilized Preoxygenation: Pre-oxygenation with 100% oxygen Induction Type: IV induction Ventilation: Mask ventilation without difficulty Laryngoscope Size: 3 and McGraph Grade View: Grade I Tube type: Oral Tube size: 8.0 mm Number of attempts: 1 Airway Equipment and Method: Stylet and Video-laryngoscopy Placement Confirmation: ETT inserted through vocal cords under direct vision,  positive ETCO2 and breath sounds checked- equal and bilateral Secured at: 22 cm Tube secured with: Tape Dental Injury: Teeth and Oropharynx as per pre-operative assessment

## 2018-10-24 NOTE — Telephone Encounter (Signed)
Pt. Was informed of results

## 2018-10-24 NOTE — Anesthesia Preprocedure Evaluation (Signed)
Anesthesia Evaluation  Patient identified by MRN, date of birth, ID band Patient awake    Reviewed: Allergy & Precautions, H&P , NPO status , Patient's Chart, lab work & pertinent test results, reviewed documented beta blocker date and time   History of Anesthesia Complications (+) PONV and history of anesthetic complications  Airway Mallampati: III  TM Distance: >3 FB Neck ROM: full    Dental  (+) Dental Advidsory Given, Chipped   Pulmonary shortness of breath and with exertion, asthma , sleep apnea , neg COPD, neg recent URI,           Cardiovascular Exercise Tolerance: Good hypertension, (-) angina+ CAD  (-) Past MI, (-) Cardiac Stents and (-) CABG + dysrhythmias Atrial Fibrillation (-) Valvular Problems/Murmurs     Neuro/Psych  Headaches, Seizures -,  PSYCHIATRIC DISORDERS Depression CVA    GI/Hepatic Neg liver ROS, GERD  ,  Endo/Other  neg diabetesHypothyroidism   Renal/GU CRFRenal disease  negative genitourinary   Musculoskeletal   Abdominal   Peds  Hematology  (+) Blood dyscrasia, anemia ,   Anesthesia Other Findings Past Medical History: No date: Allergy No date: Anemia No date: Asthma No date: Atrial fibrillation (HCC) No date: Cerebrovascular accident [facility])     Comment:  History of visual deficit No date: Chiari I malformation ([place]) No date: Coronary artery disease No date: Depression No date: Dyspnea No date: Dysrhythmia     Comment:  atrial fib No date: GERD (gastroesophageal reflux disease) No date: Hyperlipidemia No date: Hypertension No date: Hypoparathyroidism ([place]) No date: Hypothyroidism No date: Personality disorder, depressive No date: PONV (postoperative nausea and vomiting)     Comment:  difficulty breathing during endoscopy, colonoscopy               performed prior w/out problems No date: Renal insufficiency No date: Sleep apnea     Comment:  CPAP 1999: Thyroid cancer ([place])   Comment:  Papillary No date: Thyroid disease     Comment:  hypothyroid    Reproductive/Obstetrics negative OB ROS                             Anesthesia Physical Anesthesia Plan  ASA: III  Anesthesia Plan: General   Post-op Pain Management:    Induction:   PONV Risk Score and Plan: 4 or greater and Ondansetron, Dexamethasone, Midazolam, Promethazine and Treatment may vary due to age or medical condition  Airway Management Planned:   Additional Equipment:   Intra-op Plan:   Post-operative Plan:   Informed Consent: I have reviewed the patients History and Physical, chart, labs and discussed the procedure including the risks, benefits and alternatives for the proposed anesthesia with the patient or authorized representative who has indicated his/her understanding and acceptance.     Dental Advisory Given  Plan Discussed with: Anesthesiologist, CRNA and Surgeon  Anesthesia Plan Comments:         Anesthesia Quick Evaluation

## 2018-10-24 NOTE — Op Note (Signed)
Electromagnetic Navigation Bronchoscopy:   Indication: 63 year old with a history of thyroid carcinoma and multiple lung nodules some groundglass in appearance.  Interval increase of the left lower lobe solid lung nodule and left upper lobe groundglass lung nodule.  Rule out metastatic disease.  Preoperative Diagnosis: Multiple lung nodules groundglass and solid.  Rule out metastatic disease  Post Procedure Diagnosis: Same as above   Consent: Verbal/Written  The  benefits, limitations and potential complications of the procedure were discussed with the patient/family  including, but not limited to bleeding, hemoptysis, respiratory failure requiring intubation and/or prolongued mechanical ventilation, infection, pneumothorax (collapse of lung) requiring chest tube placement, stroke from air embolism or even death.  The patient/family understood the risks and benefits and agreed to go ahead with the procedure.  Hand washing performed prior to starting the procedure.  Appropriate timeout was taken at the staff.  Type of Anesthesia: General endotracheal anesthesia.  Procedure Performed:  Virtual Bronchoscopy with Multi-planar Image analysis, 3-D reconstruction of coronal, sagittal and multi-planar images for the purposes of planning real-time bronchoscopy using the iLogic Electromagnetic Navigation Bronchoscopy System (superDimension).   Description of Procedure: The patient was taken to the bronchoscopy procedure room where she was inducted under general anesthesia by CRNA/anesthesiologist.  A Portex adapter was placed on the ET tube.  The patient was under adequate general anesthesia, the Olympus video bronchoscope was advanced through the Portex adapter.  Inspection of the airway was performed.  There were no endobronchial lesions on the right tracheobronchial tree.  No endobronchial lesions on the left tracheobronchial tree.  Carina was sharp.  Visible portions of the trachea were normal.   Bronchoscope was brought to the mid tracheal level and the super dimension extended working channel was advanced into the working channel of the bronchoscope.  A locatable guide (LG) was placed in the extended working channel sheath. The LG was directed to standard registration points at the following centers: main carina, right upper lobe bronchus, right lower lobe bronchus, right middle lobe bronchus, left upper lobe bronchus, and the left lower lobe bronchus. This data was transferred to the i-Logic ENB system for real-time bronchoscopy.   The scope was then navigated to the LEFT lower lobe for tissue sampling of the solid nodule in the left lower lobe.  The position of the catheter and the LG was confirmed with fluoroscopy.  Attempts to perform Endomicroscopy were unsuccessful as the equipment would not reach calibration.  Confirmation with target acquisition therefore had to be done with fluoroscopy only.  At this point multiple transbronchial brushings were performed in this area however only inflammatory cells where obtained.  The extended working channel was repositioned to a more medial position in the lesion to obtain multiple samplings.  He will remain the same with mostly inflammatory cells.  At this point endobronchial biopsies were done using fluoroscopic guidance.  A targeted bronchoalveolar lavage was performed in this area.  40 mL's of saline were instilled yielding 8 mL's of aliquot.  Once this was completed the locatable guide was then reintroduced into the extended working channel and navigation to the upper lobe groundglass lesion was performed.  Because of the nature of the lesion (groundglass) this was not readily visible on fluoroscopy.  Hargett acquisition was obtained with a navigational system however could not be confirmed with fluoroscopy due to inability to visualize lesion.  Again because of technical issues with the [employer] equipment endo-microscopy could not be performed to  confirm the extended working channel being in the  actual lesion.  Only a single transbronchial brushing was obtained of this area as placement of the extended working channel could not be corroborated to be in a good sampling position.  At this point the extended working channel was removed and the airways were reinspected there was excellent hemostasis.  At this point lidocaine 1% was instilled via bronchial lavage into the airway for a total of 9 mL's.  Once this was achieved the Ronca scope was retrieved and the procedure was terminated.  The patient was allowed to emerge from general anesthesia and was extubated in the procedure room without difficulty.  She was taken to the PACU in satisfactory condition.  Postprocedure chest x-ray showed no pneumothorax.  Specimens Obtained:  Transbronchial Forceps Biopsy LEFT lower lobe x10  Transbronchial Brushings LEFT lower lobe x4  Targeted bronchoalveolar lavage LEFT upper lobe 40 mL saline in, 8 mL's aliquot out.  Transbronchial Brushings LEFT upper lobe x1   Fluoroscopy:  Fluoroscopy was utilized during the course of this procedure to assure that biopsies were taken in a safe manner under fluoroscopic guidance with spot films required.   Complications: Procedural: None.  Post procedure chest x-ray showed no pneumothorax.  Technical complications: Inability to calibrate and the endomicroscope, could not be utilized to assist in target location.  Estimated Blood Loss: minimal less than 2 mL's.  Monitoring: The patient underwent postoperative monitoring per PACU guidelines.  Assessment and Plan/Additional Comments:  1.  Multiple lung nodules, rule out metastasis. 2.  History of carcinoma of the thyroid.   Renold Don, MD [facility]  Advanced Bronchoscopy

## 2018-10-24 NOTE — Transfer of Care (Signed)
Immediate Anesthesia Transfer of Care Note  Patient: Brenda Barber  Procedure(s) Performed: ELECTROMAGNETIC NAVIGATION BRONCHOSCOPY LEFT (Left )  Patient Location: PACU  Anesthesia Type:General  Level of Consciousness: sedated  Airway & Oxygen Therapy: Patient Spontanous Breathing and Patient connected to face mask oxygen  Post-op Assessment: Report given to RN and Post -op Vital signs reviewed and stable  Post vital signs: Reviewed and stable  Last Vitals:  Vitals Value Taken Time  BP 154/75 10/24/2018  2:47 PM  Temp 36 C 10/24/2018  2:45 PM  Pulse 68 10/24/2018  2:49 PM  Resp 17 10/24/2018  2:49 PM  SpO2 100 % 10/24/2018  2:49 PM  Vitals shown include unvalidated device data.  Last Pain:  Vitals:   10/24/18 1445  TempSrc:   PainSc: Asleep         Complications: No apparent anesthesia complications

## 2018-10-24 NOTE — Anesthesia Post-op Follow-up Note (Signed)
Anesthesia QCDR form completed.        

## 2018-10-24 NOTE — Interval H&P Note (Signed)
History and Physical Interval Note:  10/24/2018 12:28 PM  Brenda Barber  has presented today for surgery, with the diagnosis of LEFT LUNG NODULE WITH HISTORY OF THYROID CANCER UPPER AND LOWER LOBE.  The various methods of treatment have been discussed with the patient and family. After consideration of risks, benefits and other options for treatment, the patient has consented to  Procedure(s): ELECTROMAGNETIC NAVIGATION BRONCHOSCOPY LEFT (Left) as a surgical intervention.  The patient's history has been reviewed, patient examined, no change in status, stable for surgery.  I have reviewed the patient's chart and labs.  Questions were answered to the patient's satisfaction.     Renold Don, MD [facility]

## 2018-10-27 ENCOUNTER — Encounter: Payer: Self-pay | Admitting: Pulmonary Disease

## 2018-10-27 DIAGNOSIS — E892 Postprocedural hypoparathyroidism: Secondary | ICD-10-CM | POA: Diagnosis not present

## 2018-10-27 DIAGNOSIS — M25552 Pain in left hip: Secondary | ICD-10-CM | POA: Diagnosis not present

## 2018-10-27 DIAGNOSIS — G935 Compression of brain: Secondary | ICD-10-CM | POA: Diagnosis not present

## 2018-10-27 DIAGNOSIS — W010XXA Fall on same level from slipping, tripping and stumbling without subsequent striking against object, initial encounter: Secondary | ICD-10-CM | POA: Diagnosis not present

## 2018-10-27 DIAGNOSIS — Z85850 Personal history of malignant neoplasm of thyroid: Secondary | ICD-10-CM | POA: Diagnosis not present

## 2018-10-27 DIAGNOSIS — R911 Solitary pulmonary nodule: Secondary | ICD-10-CM | POA: Diagnosis not present

## 2018-10-27 DIAGNOSIS — I480 Paroxysmal atrial fibrillation: Secondary | ICD-10-CM | POA: Diagnosis not present

## 2018-10-27 DIAGNOSIS — M79652 Pain in left thigh: Secondary | ICD-10-CM | POA: Diagnosis not present

## 2018-10-27 DIAGNOSIS — E890 Postprocedural hypothyroidism: Secondary | ICD-10-CM | POA: Diagnosis not present

## 2018-10-27 LAB — CYTOLOGY - NON PAP

## 2018-10-27 LAB — SURGICAL PATHOLOGY

## 2018-10-27 NOTE — Anesthesia Postprocedure Evaluation (Signed)
Anesthesia Post Note  Patient: Brenda Barber  Procedure(s) Performed: ELECTROMAGNETIC NAVIGATION BRONCHOSCOPY LEFT (Left )  Patient location during evaluation: PACU Anesthesia Type: General Level of consciousness: awake and alert Pain management: pain level controlled Vital Signs Assessment: post-procedure vital signs reviewed and stable Respiratory status: spontaneous breathing, nonlabored ventilation, respiratory function stable and patient connected to nasal cannula oxygen Cardiovascular status: blood pressure returned to baseline and stable Postop Assessment: no apparent nausea or vomiting Anesthetic complications: no     Last Vitals:  Vitals:   10/24/18 1530 10/24/18 1559  BP: (!) 165/82 (!) 158/77  Pulse: 64 64  Resp: 16 16  Temp: (!) 36.2 C (!) 36.3 C  SpO2: 95% 96%    Last Pain:  Vitals:   10/24/18 1559  TempSrc: Temporal  PainSc: 0-No pain                 Martha Clan

## 2018-11-03 ENCOUNTER — Telehealth: Payer: Self-pay

## 2018-11-03 NOTE — Telephone Encounter (Signed)
LM for patient to call and schedule f/u visit from bronchoscopy.

## 2018-11-03 NOTE — Telephone Encounter (Signed)
Spoke to patient, apt scheduled. Nothing further at this time.

## 2018-11-04 ENCOUNTER — Other Ambulatory Visit: Payer: Self-pay | Admitting: *Deleted

## 2018-11-06 ENCOUNTER — Other Ambulatory Visit: Payer: Medicare HMO

## 2018-11-06 ENCOUNTER — Other Ambulatory Visit: Payer: Self-pay

## 2018-11-06 NOTE — Progress Notes (Signed)
Tumor Board Documentation  Brenda Barber was presented by Dr  Rogue Bussing at our Tumor Board on 11/06/2018, which included representatives from medical oncology, radiation oncology, surgical oncology, surgical, radiology, pathology, navigation, internal medicine, research.  Brenda Barber currently presents as a current patient, for Council with history of the following treatments: active survellience.  Additionally, we reviewed previous medical and familial history, history of present illness, and recent lab results along with all available histopathologic and imaging studies. The tumor board considered available treatment options and made the following recommendations: Active surveillance Repeat imaging in 3 months, if enlarging, attempt biopsy and offer Radiation Therapy if path is positive  The following procedures/referrals were also placed: No orders of the defined types were placed in this encounter.   Clinical Trial Status: not discussed   Staging used:    Biopsy performed was not diagnostic  National site-specific guidelines   were discussed with respect to the case.  Tumor board is a meeting of clinicians from various specialty areas who evaluate and discuss patients for whom a multidisciplinary approach is being considered. Final determinations in the plan of care are those of the provider(s). The responsibility for follow up of recommendations given during tumor board is that of the provider.   Today's extended care, comprehensive team conference, Brenda Barber was not present for the discussion and was not examined.   Multidisciplinary Tumor Board is a multidisciplinary case peer review process.  Decisions discussed in the Multidisciplinary Tumor Board reflect the opinions of the specialists present at the conference without having examined the patient.  Ultimately, treatment and diagnostic decisions rest with the primary provider(s) and the patient.

## 2018-11-07 ENCOUNTER — Other Ambulatory Visit: Payer: Self-pay

## 2018-11-07 ENCOUNTER — Ambulatory Visit: Payer: Medicare HMO | Admitting: Radiation Oncology

## 2018-11-07 ENCOUNTER — Inpatient Hospital Stay: Payer: Medicare HMO | Attending: Internal Medicine | Admitting: Internal Medicine

## 2018-11-07 ENCOUNTER — Encounter: Payer: Self-pay | Admitting: Internal Medicine

## 2018-11-07 DIAGNOSIS — D631 Anemia in chronic kidney disease: Secondary | ICD-10-CM | POA: Diagnosis not present

## 2018-11-07 DIAGNOSIS — R918 Other nonspecific abnormal finding of lung field: Secondary | ICD-10-CM | POA: Insufficient documentation

## 2018-11-07 DIAGNOSIS — E039 Hypothyroidism, unspecified: Secondary | ICD-10-CM | POA: Diagnosis not present

## 2018-11-07 DIAGNOSIS — Z85850 Personal history of malignant neoplasm of thyroid: Secondary | ICD-10-CM | POA: Insufficient documentation

## 2018-11-07 DIAGNOSIS — K219 Gastro-esophageal reflux disease without esophagitis: Secondary | ICD-10-CM | POA: Diagnosis not present

## 2018-11-07 DIAGNOSIS — I129 Hypertensive chronic kidney disease with stage 1 through stage 4 chronic kidney disease, or unspecified chronic kidney disease: Secondary | ICD-10-CM | POA: Diagnosis not present

## 2018-11-07 DIAGNOSIS — E785 Hyperlipidemia, unspecified: Secondary | ICD-10-CM | POA: Diagnosis not present

## 2018-11-07 DIAGNOSIS — Z79899 Other long term (current) drug therapy: Secondary | ICD-10-CM | POA: Diagnosis not present

## 2018-11-07 DIAGNOSIS — J449 Chronic obstructive pulmonary disease, unspecified: Secondary | ICD-10-CM | POA: Diagnosis not present

## 2018-11-07 DIAGNOSIS — N183 Chronic kidney disease, stage 3 (moderate): Secondary | ICD-10-CM | POA: Diagnosis not present

## 2018-11-07 NOTE — Assessment & Plan Note (Addendum)
#   Multiple lung nodules/groudn glass opacities [at least since 2015]; May 2020 CT scan shows slight progression of the bilateral lung groundglass opacities by few millimeters [compared to scan November 2019]; however left lower lobe lung nodule approximately ~ 67mm-concerning for malignancy.  #Biopsy of the left lower lobe lung nodule-nondiagnostic/no evidence of malignant cells.  Discussed with Dr. Patsey Berthold.  Also reviewed the tumor conference; current recommendation is to repeat a scan in 3 months or so.  Await appointment with pulmonary on July 2nd.   # Anemia/CKD-stage III; hemoglobin around 11.6- stable.  Continue p.o. iron.   # Thyroid cancer-s/p RAIU [2000]- stable/ no recurrence.Tumor makers- stable.  # A.fib/eliquis [Dr.Khan]- off amiodarone [may 2019]; lung groundglass unlikely related to amiodarone.  # DISPOSITION: # follow up as planned- in Nov 2020.   Cc; Dr.Solum/ Hande/ Khan/ Raul Del

## 2018-11-07 NOTE — Progress Notes (Signed)
[facility] OFFICE PROGRESS NOTE  Patient Care Team: [name], MD as PCP - General (Internal Medicine) Telford Nab, RN as Registered Nurse  Cancer Staging No matching staging information was found for the patient.   Oncology History Overview Note  # bil Lung nodules/GOO Multiple [id number]; Dr.Oaks].  # LLL nodule 8-7mm/ and bil GGO  [May 219- Off amiodarone; Dr.Khan;cards]. June 2020-biopsy [Dr.Gonzalez]-nondiagnostic.   --------------------------------------------------------------------------------------------------  # Thyroid cancer [2000; [facility]] s/p RAI; post-surgical hypothyroidism/ post-surgical hypoparathyroidism.s/p  total thyroidectomy and was found to have a 6 cm follicular thyroid cancer without any local invasion. This was a [id number] follicular thyroid cancer, stage I. S/p radioiodine (115 mCi) and subsequently in 2005, 2006, and 2008 had serially negative thyrogen-stimulated whole body scans. f/u- Dr.Solum.   # CKD/ stage III; ? COPD/asthma [Dr.Fleming]; Afib [on eliquis; Dr.Khan]         Thyroid cancer ([place])      INTERVAL HISTORY:  Brenda Barber 63 y.o.  female pleasant patient above history of multiple lung nodules currently on surveillance of unclear etiology-is here for follow-up to review the results of her biopsy of the left lower lobe lung nodule.  Patient has chronic shortness of breath with exertion.  Chronic mild cough.  Not any worse.  No nausea no vomiting.  No headaches.   Review of Systems  Constitutional: Negative for chills, diaphoresis, fever, malaise/fatigue and weight loss.  HENT: Negative for nosebleeds and sore throat.   Eyes: Negative for double vision.  Respiratory: Positive for cough, sputum production and shortness of breath. Negative for hemoptysis and wheezing.   Cardiovascular: Negative for chest pain, palpitations, orthopnea and leg swelling.  Gastrointestinal: Negative for abdominal pain, blood in stool,  constipation, diarrhea, heartburn, melena, nausea and vomiting.  Genitourinary: Negative for dysuria, frequency and urgency.  Musculoskeletal: Negative for back pain and joint pain.  Skin: Negative.  Negative for itching and rash.  Neurological: Negative for dizziness, tingling, focal weakness, weakness and headaches.  Endo/Heme/Allergies: Does not bruise/bleed easily.  Psychiatric/Behavioral: Negative for depression. The patient is not nervous/anxious and does not have insomnia.       PAST MEDICAL HISTORY :  Past Medical History:  Diagnosis Date  . Allergy   . Anemia   . Asthma   . Atrial fibrillation ([place])   . Cerebrovascular accident [facility])    History of visual deficit  . Chiari I malformation ([place])   . Coronary artery disease   . Depression   . Dyspnea   . Dysrhythmia    atrial fib  . GERD (gastroesophageal reflux disease)   . Hyperlipidemia   . Hypertension   . Hypoparathyroidism ([place])   . Hypothyroidism   . Personality disorder, depressive   . PONV (postoperative nausea and vomiting)    difficulty breathing during endoscopy, colonoscopy performed prior w/out problems  . Renal insufficiency   . Sleep apnea    CPAP  . Thyroid cancer ([place]) 1999   Papillary  . Thyroid disease    hypothyroid     PAST SURGICAL HISTORY :   Past Surgical History:  Procedure Laterality Date  . CANNOT TOLERATE PAP / Virginal    . CATARACT EXTRACTION, BILATERAL    . ELECTROMAGNETIC NAVIGATION BROCHOSCOPY Left 10/24/2018   Procedure: ELECTROMAGNETIC NAVIGATION BRONCHOSCOPY LEFT;  Surgeon: Tyler Pita, MD;  Location: [facility] ORS;  Service: Cardiopulmonary;  Laterality: Left;  . LEFT HEART CATH AND CORONARY ANGIOGRAPHY Right 11/18/2017   Procedure: Left Heart Cath with possible coronary intervention;  Surgeon:  Dionisio David, MD;  Location: [facility] CV LAB;  Service: Cardiovascular;  Laterality: Right;  . MRI brain  12/1999  . THYROIDECTOMY    . TONSILLECTOMY    . TOTAL HIP  ARTHROPLASTY Right 06/26/2016   Procedure: TOTAL HIP ARTHROPLASTY ANTERIOR APPROACH;  Surgeon: Hessie Knows, MD;  Location: [facility] ORS;  Service: Orthopedics;  Laterality: Right;    FAMILY HISTORY :   Family History  Problem Relation Age of Onset  . Diabetes Father   . Diabetes Paternal Aunt   . Diabetes Paternal Uncle   . Breast cancer Sister 21    SOCIAL HISTORY:   Social History   Tobacco Use  . Smoking status: Never Smoker  . Smokeless tobacco: Never Used  Substance Use Topics  . Alcohol use: No  . Drug use: No    ALLERGIES:  is allergic to aspirin-dipyridamole er and rosuvastatin.  MEDICATIONS:  Current Outpatient Medications  Medication Sig Dispense Refill  . albuterol (PROVENTIL HFA;VENTOLIN HFA) 108 (90 BASE) MCG/ACT inhaler Inhale 2 puffs into the lungs every 6 (six) hours as needed for wheezing or shortness of breath.     Marland Kitchen apixaban (ELIQUIS) 5 MG TABS tablet Take 5 mg by mouth 2 (two) times daily.     Marland Kitchen atorvastatin (LIPITOR) 80 MG tablet Take 80 mg by mouth at bedtime.     . Azelastine HCl 0.15 % SOLN Place 1 spray into both nostrils 2 (two) times daily. Use in each nostril as directed     . beclomethasone (QVAR) 80 MCG/ACT inhaler Inhale 1 puff into the lungs daily as needed (Asthma).     . benzonatate (TESSALON) 100 MG capsule Take 100 mg by mouth 3 (three) times daily as needed for cough.     . calcitRIOL (ROCALTROL) 0.25 MCG capsule Take 0.25-0.5 mcg by mouth See admin instructions. Take 2 capsules every morning and 1 capsule every evening.     . calcium carbonate (TUMS E-X 750) 750 MG chewable tablet Chew 1 tablet by mouth at bedtime.     . Fluticasone-Salmeterol (ADVAIR) 250-50 MCG/DOSE AEPB Inhale 1 puff into the lungs 2 (two) times daily.    . iron polysaccharides (NIFEREX) 150 MG capsule Take 150 mg by mouth daily.     Marland Kitchen levothyroxine (SYNTHROID, LEVOTHROID) 137 MCG tablet Take 137 mcg by mouth daily before breakfast.     . losartan (COZAAR) 50 MG tablet Take  50 mg by mouth daily.     . montelukast (SINGULAIR) 10 MG tablet Take 10 mg by mouth at bedtime.    . Multiple Vitamin (MULTIVITAMIN) tablet Take 1 tablet by mouth daily.    . niacin 500 MG tablet Take 500 mg by mouth daily.    . nitroGLYCERIN (NITROSTAT) 0.4 MG SL tablet Place 0.4 mg under the tongue every 5 (five) minutes as needed for chest pain.     . potassium chloride SA (K-DUR,KLOR-CON) 20 MEQ tablet Take 20 mEq by mouth daily.     Marland Kitchen Propylene Glycol (SYSTANE BALANCE) 0.6 % SOLN Place 1 drop into both eyes at bedtime.    . sotalol (BETAPACE) 80 MG tablet Take 1 tablet by mouth daily.    . sucralfate (CARAFATE) 1 g tablet Take 1 g by mouth 2 (two) times daily.    . vitamin C (ASCORBIC ACID) 500 MG tablet Take 500 mg by mouth daily.    Marland Kitchen acetaminophen (TYLENOL) 500 MG tablet Take 500 mg by mouth every 6 (six) hours as needed for mild  pain.     No current facility-administered medications for this visit.     PHYSICAL EXAMINATION: ECOG PERFORMANCE STATUS: 1 - Symptomatic but completely ambulatory  BP (!) 150/97 (BP Location: Left Arm, Patient Position: Sitting, Cuff Size: Normal)   Pulse 76   Temp 99 F (37.2 C) (Tympanic)   Resp 18   Ht 5\' 4"  (1.626 m)   Wt 146 lb 8 oz (66.5 kg)   BMI 25.15 kg/m   Filed Weights   11/07/18 0845  Weight: 146 lb 8 oz (66.5 kg)    Physical Exam  Constitutional: She is oriented to person, place, and time and well-developed, well-nourished, and in no distress.  Alone.  HENT:  Head: Normocephalic and atraumatic.  Mouth/Throat: Oropharynx is clear and moist. No oropharyngeal exudate.  Eyes: Pupils are equal, round, and reactive to light.  Neck: Normal range of motion. Neck supple.  Cardiovascular: Normal rate and regular rhythm.  Pulmonary/Chest: No respiratory distress. She has no wheezes.  Decreased air entry bilaterally.  Abdominal: Soft. Bowel sounds are normal. She exhibits no distension and no mass. There is no abdominal tenderness.  There is no rebound and no guarding.  Musculoskeletal: Normal range of motion.        General: No tenderness or edema.  Neurological: She is alert and oriented to person, place, and time.  Skin: Skin is warm.  Psychiatric: Affect normal.       LABORATORY DATA:  I have reviewed the data as listed    Component Value Date/Time   NA 144 10/03/2018 1117   NA 139 09/03/2013 1947   K 3.4 (L) 10/03/2018 1117   K 3.7 09/03/2013 1947   CL 108 10/03/2018 1117   CL 104 09/03/2013 1947   CO2 27 10/03/2018 1117   CO2 28 09/03/2013 1947   GLUCOSE 130 (H) 10/03/2018 1117   GLUCOSE 123 (H) 09/03/2013 1947   BUN 27 (H) 10/03/2018 1117   BUN 22 (H) 09/03/2013 1947   CREATININE 1.51 (H) 10/03/2018 1117   CREATININE 0.96 09/03/2013 1947   CALCIUM 10.4 (H) 10/03/2018 1117   CALCIUM 7.7 (L) 09/03/2013 1947   CALCIUM 8.3 (L) 12/19/2009 0000   PROT 6.9 10/03/2018 1117   PROT 7.4 09/03/2013 1947   ALBUMIN 4.1 10/03/2018 1117   ALBUMIN 3.6 09/03/2013 1947   AST 22 10/03/2018 1117   AST 19 09/03/2013 1947   ALT 26 10/03/2018 1117   ALT 22 09/03/2013 1947   ALKPHOS 97 10/03/2018 1117   ALKPHOS 103 09/03/2013 1947   BILITOT 0.7 10/03/2018 1117   BILITOT 0.5 09/03/2013 1947   GFRNONAA 37 (L) 10/03/2018 1117   GFRNONAA >60 09/03/2013 1947   GFRAA 42 (L) 10/03/2018 1117   GFRAA >60 09/03/2013 1947    No results found for: SPEP, UPEP  Lab Results  Component Value Date   WBC 7.9 10/03/2018   NEUTROABS 5.4 10/03/2018   HGB 11.6 (L) 10/03/2018   HCT 37.2 10/03/2018   MCV 88.4 10/03/2018   PLT 230 10/03/2018      Chemistry      Component Value Date/Time   NA 144 10/03/2018 1117   NA 139 09/03/2013 1947   K 3.4 (L) 10/03/2018 1117   K 3.7 09/03/2013 1947   CL 108 10/03/2018 1117   CL 104 09/03/2013 1947   CO2 27 10/03/2018 1117   CO2 28 09/03/2013 1947   BUN 27 (H) 10/03/2018 1117   BUN 22 (H) 09/03/2013 1947   CREATININE 1.51 (H)  10/03/2018 1117   CREATININE 0.96 09/03/2013 1947       Component Value Date/Time   CALCIUM 10.4 (H) 10/03/2018 1117   CALCIUM 7.7 (L) 09/03/2013 1947   CALCIUM 8.3 (L) 12/19/2009 0000   ALKPHOS 97 10/03/2018 1117   ALKPHOS 103 09/03/2013 1947   AST 22 10/03/2018 1117   AST 19 09/03/2013 1947   ALT 26 10/03/2018 1117   ALT 22 09/03/2013 1947   BILITOT 0.7 10/03/2018 1117   BILITOT 0.5 09/03/2013 1947       RADIOGRAPHIC STUDIES: I have personally reviewed the radiological images as listed and agreed with the findings in the report. No results found.   ASSESSMENT & PLAN:  Multiple lung nodules # Multiple lung nodules/groudn glass opacities [at least since 2015]; May 2020 CT scan shows slight progression of the bilateral lung groundglass opacities by few millimeters [compared to scan November 2019]; however left lower lobe lung nodule approximately ~ 54mm-concerning for malignancy.  #Biopsy of the left lower lobe lung nodule-nondiagnostic/no evidence of malignant cells.  Discussed with Dr. Patsey Berthold.  Also reviewed the tumor conference; current recommendation is to repeat a scan in 3 months or so.  Await appointment with pulmonary on July 2nd.   # Anemia/CKD-stage III; hemoglobin around 11.6- stable.  Continue p.o. iron.   # Thyroid cancer-s/p RAIU [2000]- stable/ no recurrence.Tumor makers- stable.  # A.fib/eliquis [Dr.Khan]- off amiodarone [may 2019]; lung groundglass unlikely related to amiodarone.  # DISPOSITION: # follow up as planned- in Nov 2020.   Cc; Dr.Solum/ Hande/ Khan/ Fleming   No orders of the defined types were placed in this encounter.  All questions were answered. The patient knows to call the clinic with any problems, questions or concerns.      Cammie Sickle, MD 11/07/2018 10:31 PM

## 2018-11-20 ENCOUNTER — Ambulatory Visit (INDEPENDENT_AMBULATORY_CARE_PROVIDER_SITE_OTHER): Payer: Medicare HMO | Admitting: Pulmonary Disease

## 2018-11-20 ENCOUNTER — Encounter: Payer: Self-pay | Admitting: Pulmonary Disease

## 2018-11-20 ENCOUNTER — Other Ambulatory Visit: Payer: Self-pay

## 2018-11-20 VITALS — BP 158/82 | HR 79 | Temp 97.9°F | Ht 64.0 in | Wt 145.4 lb

## 2018-11-20 DIAGNOSIS — Z85850 Personal history of malignant neoplasm of thyroid: Secondary | ICD-10-CM | POA: Diagnosis not present

## 2018-11-20 DIAGNOSIS — I480 Paroxysmal atrial fibrillation: Secondary | ICD-10-CM | POA: Diagnosis not present

## 2018-11-20 DIAGNOSIS — R918 Other nonspecific abnormal finding of lung field: Secondary | ICD-10-CM

## 2018-11-20 NOTE — Patient Instructions (Signed)
1.  You will have a CT scan of the chest on 16 November and we will see you in follow-up a day or 2 after that.  At that time we will remeasure the area that was worrisome and determine if you need a biopsy of this area.  2.  Continue to follow with Dr. Raul Del for your asthma issues.  I am strictly following the issues with your lung nodules.

## 2018-11-20 NOTE — Progress Notes (Signed)
Subjective:    Patient ID: Brenda Barber, female    DOB: 09/28/1955, 63 y.o.   MRN: [id number]  HPI The patient is a 63 year old very complex lifelong never smoker, who presents after navigational bronchoscopy performed on 24 October 2018 with biopsy to the dominant nodule on the LEFT lower lobe.  Patient also had a groundglass opacity on the LEFT upper lobe which was brushed.  Unfortunately during the procedure in the microscopy was not available which would have been crucial due to the nature of the lesions which is mostly semisolid to solid.  The procedure did not yield a tentative diagnosis.  The concern is potential metastatic thyroid cancer.  Patient has been asymptomatic in this regard.  I have discussed with Dr. Rogue Bussing and the patient was discussed and tumor board as well and the consensus is to repeat CT in 3 months time.  If there is increase on the size of the lesion repeat biopsy would be indicated.  With regards to the groundglass opacities the diagnosis is vast could be secondary to metastatic disease, inflammation and/or drug reaction (patient is on amiodarone).  Patient has been totally asymptomatic since her initial visit here.  She has had no issues with dyspnea, cough, sputum production, fevers, chills or sweats.  She has been following strict mask when in public areas and has been practicing social distancing.  Review of Systems  Constitutional: Negative.   HENT: Positive for tinnitus (Chronic).        Wears hearing aids.  Eyes: Negative.   Respiratory: Negative.   Cardiovascular: Negative.  Negative for leg swelling (Occasional ankle swelling.).  Gastrointestinal: Negative.   Endocrine: Positive for cold intolerance.  Genitourinary: Negative.   Musculoskeletal: Positive for gait problem.  Skin: Negative.   Allergic/Immunologic: Negative.   Neurological:       No issues  Hematological: Negative.   Psychiatric/Behavioral: Negative.   All other systems reviewed and are  negative.      Objective:   Physical Exam Vitals signs and nursing note reviewed.  Constitutional:      Appearance: She is normal weight.  HENT:     Head: Normocephalic and atraumatic.     Comments: Wears hearing aids    Ears:     Comments: Wears hearing aids.    Nose:     Comments: Nose/mouth/throat Limited exam due to masking requirements for COVID-19. Eyes:     General: No scleral icterus.    Conjunctiva/sclera: Conjunctivae normal.     Pupils: Pupils are equal, round, and reactive to light.  Neck:     Musculoskeletal: Neck supple.     Trachea: Trachea and phonation normal.     Comments: Status post thyroidectomy Cardiovascular:     Rate and Rhythm: Normal rate and regular rhythm.     Pulses: Normal pulses.     Heart sounds: Normal heart sounds.  Pulmonary:     Effort: Pulmonary effort is normal.     Breath sounds: Normal breath sounds.  Abdominal:     General: There is no distension.     Palpations: Abdomen is soft.  Musculoskeletal: Normal range of motion.     Right lower leg: No edema.     Left lower leg: No edema.  Skin:    General: Skin is warm and dry.  Neurological:     General: No focal deficit present.     Mental Status: She is alert and oriented to person, place, and time.  Psychiatric:  Mood and Affect: Mood normal.        Behavior: Behavior normal.        Thought Content: Thought content normal.           Assessment & Plan:  1.  Multiple lung nodules with solid and/or semisolid (groundglass) component: The patient has had intermittent growth from 1 surveillance CT to the next.  This is concerning for malignancy.  She underwent navigational bronchoscopy on 24 October 2018 however the biopsies were nondiagnostic.  She will have repeat CT scan of the chest in 3 months time (scheduled for 16 November) we will see her after that CT is done to reevaluate the issue of biopsy.  Note is made that I am following the patient strictly for the issue of  potential need for further navigational bronchoscopy.  The patient does have issues with asthma and these are being expertly managed by Dr. Wallene Huh and she will continue to follow with Dr. Raul Del in this regard.  2.  History of thyroid cancer: This issue adds complexity to her management.  Discern for metastatic disease as noted above.   3.  Paroxysmal atrial fibrillation on apixaban: This issue adds complexity to her management.  She is currently on apixaban.  Should she need biopsy in the future this will need to be taken into account.   This chart was dictated using voice recognition software/Dragon.  Despite best efforts to proofread, errors can occur which can change the meaning.  Any change was purely unintentional.

## 2018-11-24 DIAGNOSIS — J4520 Mild intermittent asthma, uncomplicated: Secondary | ICD-10-CM | POA: Diagnosis not present

## 2018-11-24 DIAGNOSIS — G4733 Obstructive sleep apnea (adult) (pediatric): Secondary | ICD-10-CM | POA: Diagnosis not present

## 2018-12-16 DIAGNOSIS — I4891 Unspecified atrial fibrillation: Secondary | ICD-10-CM | POA: Diagnosis not present

## 2018-12-16 DIAGNOSIS — I2510 Atherosclerotic heart disease of native coronary artery without angina pectoris: Secondary | ICD-10-CM | POA: Diagnosis not present

## 2018-12-16 DIAGNOSIS — J45998 Other asthma: Secondary | ICD-10-CM | POA: Diagnosis not present

## 2018-12-16 DIAGNOSIS — R071 Chest pain on breathing: Secondary | ICD-10-CM | POA: Diagnosis not present

## 2018-12-16 DIAGNOSIS — E785 Hyperlipidemia, unspecified: Secondary | ICD-10-CM | POA: Diagnosis not present

## 2018-12-16 DIAGNOSIS — R0602 Shortness of breath: Secondary | ICD-10-CM | POA: Diagnosis not present

## 2018-12-16 DIAGNOSIS — I10 Essential (primary) hypertension: Secondary | ICD-10-CM | POA: Diagnosis not present

## 2018-12-22 DIAGNOSIS — R079 Chest pain, unspecified: Secondary | ICD-10-CM | POA: Diagnosis not present

## 2018-12-25 DIAGNOSIS — R079 Chest pain, unspecified: Secondary | ICD-10-CM | POA: Diagnosis not present

## 2018-12-25 DIAGNOSIS — I2510 Atherosclerotic heart disease of native coronary artery without angina pectoris: Secondary | ICD-10-CM | POA: Diagnosis not present

## 2018-12-25 DIAGNOSIS — R0602 Shortness of breath: Secondary | ICD-10-CM | POA: Diagnosis not present

## 2018-12-25 DIAGNOSIS — I4891 Unspecified atrial fibrillation: Secondary | ICD-10-CM | POA: Diagnosis not present

## 2018-12-25 DIAGNOSIS — E785 Hyperlipidemia, unspecified: Secondary | ICD-10-CM | POA: Diagnosis not present

## 2018-12-25 DIAGNOSIS — I10 Essential (primary) hypertension: Secondary | ICD-10-CM | POA: Diagnosis not present

## 2019-01-09 DIAGNOSIS — E892 Postprocedural hypoparathyroidism: Secondary | ICD-10-CM | POA: Diagnosis not present

## 2019-01-09 DIAGNOSIS — E890 Postprocedural hypothyroidism: Secondary | ICD-10-CM | POA: Diagnosis not present

## 2019-01-09 DIAGNOSIS — Z85850 Personal history of malignant neoplasm of thyroid: Secondary | ICD-10-CM | POA: Diagnosis not present

## 2019-01-12 DIAGNOSIS — R296 Repeated falls: Secondary | ICD-10-CM | POA: Diagnosis not present

## 2019-01-12 DIAGNOSIS — R413 Other amnesia: Secondary | ICD-10-CM | POA: Diagnosis not present

## 2019-01-12 DIAGNOSIS — G935 Compression of brain: Secondary | ICD-10-CM | POA: Diagnosis not present

## 2019-01-13 DIAGNOSIS — Z85850 Personal history of malignant neoplasm of thyroid: Secondary | ICD-10-CM | POA: Diagnosis not present

## 2019-01-13 DIAGNOSIS — E892 Postprocedural hypoparathyroidism: Secondary | ICD-10-CM | POA: Diagnosis not present

## 2019-01-13 DIAGNOSIS — E890 Postprocedural hypothyroidism: Secondary | ICD-10-CM | POA: Diagnosis not present

## 2019-01-29 DIAGNOSIS — I10 Essential (primary) hypertension: Secondary | ICD-10-CM | POA: Diagnosis not present

## 2019-01-29 DIAGNOSIS — E785 Hyperlipidemia, unspecified: Secondary | ICD-10-CM | POA: Diagnosis not present

## 2019-01-29 DIAGNOSIS — G4733 Obstructive sleep apnea (adult) (pediatric): Secondary | ICD-10-CM | POA: Diagnosis not present

## 2019-01-29 DIAGNOSIS — I2510 Atherosclerotic heart disease of native coronary artery without angina pectoris: Secondary | ICD-10-CM | POA: Diagnosis not present

## 2019-02-13 DIAGNOSIS — G4733 Obstructive sleep apnea (adult) (pediatric): Secondary | ICD-10-CM | POA: Diagnosis not present

## 2019-02-24 DIAGNOSIS — G935 Compression of brain: Secondary | ICD-10-CM | POA: Diagnosis not present

## 2019-02-24 DIAGNOSIS — Z9181 History of falling: Secondary | ICD-10-CM | POA: Diagnosis not present

## 2019-02-24 DIAGNOSIS — D649 Anemia, unspecified: Secondary | ICD-10-CM | POA: Diagnosis not present

## 2019-02-24 DIAGNOSIS — R911 Solitary pulmonary nodule: Secondary | ICD-10-CM | POA: Diagnosis not present

## 2019-02-24 DIAGNOSIS — E890 Postprocedural hypothyroidism: Secondary | ICD-10-CM | POA: Diagnosis not present

## 2019-02-24 DIAGNOSIS — E892 Postprocedural hypoparathyroidism: Secondary | ICD-10-CM | POA: Diagnosis not present

## 2019-02-24 DIAGNOSIS — M79652 Pain in left thigh: Secondary | ICD-10-CM | POA: Diagnosis not present

## 2019-02-24 DIAGNOSIS — I480 Paroxysmal atrial fibrillation: Secondary | ICD-10-CM | POA: Diagnosis not present

## 2019-02-24 DIAGNOSIS — M25552 Pain in left hip: Secondary | ICD-10-CM | POA: Diagnosis not present

## 2019-03-02 ENCOUNTER — Other Ambulatory Visit: Payer: Self-pay | Admitting: Internal Medicine

## 2019-03-02 DIAGNOSIS — Z23 Encounter for immunization: Secondary | ICD-10-CM | POA: Diagnosis not present

## 2019-03-02 DIAGNOSIS — I10 Essential (primary) hypertension: Secondary | ICD-10-CM | POA: Diagnosis not present

## 2019-03-02 DIAGNOSIS — G4733 Obstructive sleep apnea (adult) (pediatric): Secondary | ICD-10-CM | POA: Diagnosis not present

## 2019-03-02 DIAGNOSIS — Z9989 Dependence on other enabling machines and devices: Secondary | ICD-10-CM | POA: Diagnosis not present

## 2019-03-02 DIAGNOSIS — G935 Compression of brain: Secondary | ICD-10-CM | POA: Diagnosis not present

## 2019-03-02 DIAGNOSIS — Z8709 Personal history of other diseases of the respiratory system: Secondary | ICD-10-CM | POA: Diagnosis not present

## 2019-03-02 DIAGNOSIS — I480 Paroxysmal atrial fibrillation: Secondary | ICD-10-CM | POA: Diagnosis not present

## 2019-03-02 DIAGNOSIS — E209 Hypoparathyroidism, unspecified: Secondary | ICD-10-CM | POA: Diagnosis not present

## 2019-03-02 DIAGNOSIS — J449 Chronic obstructive pulmonary disease, unspecified: Secondary | ICD-10-CM | POA: Diagnosis not present

## 2019-03-02 DIAGNOSIS — Z1231 Encounter for screening mammogram for malignant neoplasm of breast: Secondary | ICD-10-CM

## 2019-03-02 DIAGNOSIS — E890 Postprocedural hypothyroidism: Secondary | ICD-10-CM | POA: Diagnosis not present

## 2019-03-09 DIAGNOSIS — Z85850 Personal history of malignant neoplasm of thyroid: Secondary | ICD-10-CM | POA: Diagnosis not present

## 2019-03-09 DIAGNOSIS — E890 Postprocedural hypothyroidism: Secondary | ICD-10-CM | POA: Diagnosis not present

## 2019-03-15 DIAGNOSIS — G4733 Obstructive sleep apnea (adult) (pediatric): Secondary | ICD-10-CM | POA: Diagnosis not present

## 2019-03-16 DIAGNOSIS — Z85850 Personal history of malignant neoplasm of thyroid: Secondary | ICD-10-CM | POA: Diagnosis not present

## 2019-03-16 DIAGNOSIS — E890 Postprocedural hypothyroidism: Secondary | ICD-10-CM | POA: Diagnosis not present

## 2019-03-16 DIAGNOSIS — E892 Postprocedural hypoparathyroidism: Secondary | ICD-10-CM | POA: Diagnosis not present

## 2019-03-16 DIAGNOSIS — E119 Type 2 diabetes mellitus without complications: Secondary | ICD-10-CM | POA: Diagnosis not present

## 2019-03-31 DIAGNOSIS — L918 Other hypertrophic disorders of the skin: Secondary | ICD-10-CM | POA: Diagnosis not present

## 2019-03-31 DIAGNOSIS — L729 Follicular cyst of the skin and subcutaneous tissue, unspecified: Secondary | ICD-10-CM | POA: Diagnosis not present

## 2019-03-31 DIAGNOSIS — D485 Neoplasm of uncertain behavior of skin: Secondary | ICD-10-CM | POA: Diagnosis not present

## 2019-04-06 ENCOUNTER — Other Ambulatory Visit: Payer: Self-pay

## 2019-04-06 ENCOUNTER — Ambulatory Visit
Admission: RE | Admit: 2019-04-06 | Discharge: 2019-04-06 | Disposition: A | Discharge status: Home / Self Care | Payer: Medicare HMO | Source: Ambulatory Visit | Attending: Internal Medicine | Admitting: Internal Medicine

## 2019-04-06 DIAGNOSIS — R918 Other nonspecific abnormal finding of lung field: Secondary | ICD-10-CM | POA: Insufficient documentation

## 2019-04-07 ENCOUNTER — Other Ambulatory Visit: Payer: Self-pay | Admitting: *Deleted

## 2019-04-07 DIAGNOSIS — C73 Malignant neoplasm of thyroid gland: Secondary | ICD-10-CM

## 2019-04-07 DIAGNOSIS — R911 Solitary pulmonary nodule: Secondary | ICD-10-CM

## 2019-04-07 DIAGNOSIS — G4733 Obstructive sleep apnea (adult) (pediatric): Secondary | ICD-10-CM | POA: Diagnosis not present

## 2019-04-08 ENCOUNTER — Other Ambulatory Visit: Payer: Self-pay

## 2019-04-08 ENCOUNTER — Other Ambulatory Visit: Payer: Self-pay | Admitting: *Deleted

## 2019-04-08 ENCOUNTER — Inpatient Hospital Stay (HOSPITAL_BASED_OUTPATIENT_CLINIC_OR_DEPARTMENT_OTHER): Payer: Medicare HMO | Admitting: Internal Medicine

## 2019-04-08 ENCOUNTER — Inpatient Hospital Stay: Payer: Medicare HMO | Attending: Internal Medicine

## 2019-04-08 VITALS — BP 154/82 | HR 69 | Temp 99.1°F | Wt 150.0 lb

## 2019-04-08 DIAGNOSIS — Z7901 Long term (current) use of anticoagulants: Secondary | ICD-10-CM | POA: Insufficient documentation

## 2019-04-08 DIAGNOSIS — K219 Gastro-esophageal reflux disease without esophagitis: Secondary | ICD-10-CM | POA: Insufficient documentation

## 2019-04-08 DIAGNOSIS — E890 Postprocedural hypothyroidism: Secondary | ICD-10-CM | POA: Diagnosis not present

## 2019-04-08 DIAGNOSIS — E785 Hyperlipidemia, unspecified: Secondary | ICD-10-CM | POA: Diagnosis not present

## 2019-04-08 DIAGNOSIS — C73 Malignant neoplasm of thyroid gland: Secondary | ICD-10-CM

## 2019-04-08 DIAGNOSIS — R911 Solitary pulmonary nodule: Secondary | ICD-10-CM

## 2019-04-08 DIAGNOSIS — I2510 Atherosclerotic heart disease of native coronary artery without angina pectoris: Secondary | ICD-10-CM | POA: Diagnosis not present

## 2019-04-08 DIAGNOSIS — Z79899 Other long term (current) drug therapy: Secondary | ICD-10-CM | POA: Insufficient documentation

## 2019-04-08 DIAGNOSIS — J449 Chronic obstructive pulmonary disease, unspecified: Secondary | ICD-10-CM | POA: Insufficient documentation

## 2019-04-08 DIAGNOSIS — R918 Other nonspecific abnormal finding of lung field: Secondary | ICD-10-CM | POA: Diagnosis not present

## 2019-04-08 DIAGNOSIS — N183 Chronic kidney disease, stage 3 unspecified: Secondary | ICD-10-CM | POA: Diagnosis not present

## 2019-04-08 DIAGNOSIS — Z7951 Long term (current) use of inhaled steroids: Secondary | ICD-10-CM | POA: Diagnosis not present

## 2019-04-08 DIAGNOSIS — Z9989 Dependence on other enabling machines and devices: Secondary | ICD-10-CM | POA: Diagnosis not present

## 2019-04-08 DIAGNOSIS — Z8673 Personal history of transient ischemic attack (TIA), and cerebral infarction without residual deficits: Secondary | ICD-10-CM | POA: Diagnosis not present

## 2019-04-08 DIAGNOSIS — G4730 Sleep apnea, unspecified: Secondary | ICD-10-CM | POA: Insufficient documentation

## 2019-04-08 DIAGNOSIS — I129 Hypertensive chronic kidney disease with stage 1 through stage 4 chronic kidney disease, or unspecified chronic kidney disease: Secondary | ICD-10-CM | POA: Insufficient documentation

## 2019-04-08 DIAGNOSIS — I4891 Unspecified atrial fibrillation: Secondary | ICD-10-CM | POA: Insufficient documentation

## 2019-04-08 LAB — COMPREHENSIVE METABOLIC PANEL
ALT: 32 U/L (ref 0–44)
AST: 24 U/L (ref 15–41)
Albumin: 4 g/dL (ref 3.5–5.0)
Alkaline Phosphatase: 104 U/L (ref 38–126)
Anion gap: 7 (ref 5–15)
BUN: 28 mg/dL — ABNORMAL HIGH (ref 8–23)
CO2: 27 mmol/L (ref 22–32)
Calcium: 9.7 mg/dL (ref 8.9–10.3)
Chloride: 109 mmol/L (ref 98–111)
Creatinine, Ser: 1.11 mg/dL — ABNORMAL HIGH (ref 0.44–1.00)
GFR calc Af Amer: >60 mL/min (ref >60)
GFR calc non Af Amer: 53 mL/min — ABNORMAL LOW (ref >60)
Glucose, Bld: 101 mg/dL — ABNORMAL HIGH (ref 70–99)
Potassium: 3.6 mmol/L (ref 3.5–5.1)
Sodium: 143 mmol/L (ref 135–145)
Total Bilirubin: 0.7 mg/dL (ref 0.3–1.2)
Total Protein: 6.7 g/dL (ref 6.5–8.1)

## 2019-04-08 LAB — CBC WITH DIFFERENTIAL/PLATELET
Abs Immature Granulocytes: 0.03 10*3/uL (ref 0.00–0.07)
Basophils Absolute: 0 10*3/uL (ref 0.0–0.1)
Basophils Relative: 1 %
Eosinophils Absolute: 0.1 10*3/uL (ref 0.0–0.5)
Eosinophils Relative: 2 %
HCT: 37 % (ref 36.0–46.0)
Hemoglobin: 11.4 g/dL — ABNORMAL LOW (ref 12.0–15.0)
Immature Granulocytes: 0 %
Lymphocytes Relative: 27 %
Lymphs Abs: 1.9 10*3/uL (ref 0.7–4.0)
MCH: 28.2 pg (ref 26.0–34.0)
MCHC: 30.8 g/dL (ref 30.0–36.0)
MCV: 91.6 fL (ref 80.0–100.0)
Monocytes Absolute: 0.5 10*3/uL (ref 0.1–1.0)
Monocytes Relative: 7 %
Neutro Abs: 4.3 10*3/uL (ref 1.7–7.7)
Neutrophils Relative %: 63 %
Platelets: 210 10*3/uL (ref 150–400)
RBC: 4.04 MIL/uL (ref 3.87–5.11)
RDW: 14.6 % (ref 11.5–15.5)
WBC: 6.9 10*3/uL (ref 4.0–10.5)
nRBC: 0 % (ref 0.0–0.2)

## 2019-04-08 NOTE — Assessment & Plan Note (Addendum)
#   Multiple lung nodules/groudn glass opacities [at least since 2015]; NOV CT scan shows slight progression of the bilateral lung groundglass opacities by few millimeters [compared to scan November 2019]; however left lower lobe lung nodule approximately ~ 23mm-concerning for malignancy [previous biopsy inconclusive]-Overall STABLE.  #Await evaluation with pulmonary later this week re: lung Follow-up.  # Anemia/CKD-stage III; hemoglobin around 11.4; STABLE; check iron studies next visit   # Thyroid cancer-s/p RAIU [2000]- stable/ no recurrence.Tumor makers- stable; TSH- 0.02 at goal.   # DISPOSITION: # follow up in 6 months- MD; labs-cbc.cmp/iron studies/ferrritin--Dr.B   Cc; Dr.Gonzalez

## 2019-04-08 NOTE — Progress Notes (Signed)
[place] OFFICE PROGRESS NOTE  Patient Care Team: [name], MD as PCP - General (Internal Medicine) Telford Nab, RN as Registered Nurse Erby Pian, MD as Consulting Physician (Pulmonary Disease)  Cancer Staging No matching staging information was found for the patient.   Oncology History Overview Note  # bil Lung nodules/GOO Multiple [id number]; Dr.Oaks].  # LLL nodule 8-73mm/ and bil GGO  [May 219- Off amiodarone; Dr.Khan;cards]. June 2020-biopsy [Dr.Gonzalez]-nondiagnostic.   --------------------------------------------------------------------------------------------------  # Thyroid cancer [2000; [facility]] s/p RAI; post-surgical hypothyroidism/ post-surgical hypoparathyroidism.s/p  total thyroidectomy and was found to have a 6 cm follicular thyroid cancer without any local invasion. This was a [id number] follicular thyroid cancer, stage I. S/p radioiodine (115 mCi) and subsequently in 2005, 2006, and 2008 had serially negative thyrogen-stimulated whole body scans. f/u- Dr.Solum.   # CKD/ stage III; ? COPD/asthma [Dr.Fleming]; Afib [on eliquis; Dr.Khan]         Thyroid cancer ([place])    INTERVAL HISTORY:  Brenda Barber 63 y.o.  female pleasant patient above history of multiple lung nodules currently on surveillance of unclear etiology-is here for follow-up to review the results CT scan.  Patient has chronic shortness of breath on exertion.  Chronic cough.  Not any worse.  No nausea no vomiting no headaches.   Review of Systems  Constitutional: Negative for chills, diaphoresis, fever, malaise/fatigue and weight loss.  HENT: Negative for nosebleeds and sore throat.   Eyes: Negative for double vision.  Respiratory: Positive for cough, sputum production and shortness of breath. Negative for hemoptysis and wheezing.   Cardiovascular: Negative for chest pain, palpitations, orthopnea and leg swelling.  Gastrointestinal: Negative for abdominal pain, blood in  stool, constipation, diarrhea, heartburn, melena, nausea and vomiting.  Genitourinary: Negative for dysuria, frequency and urgency.  Musculoskeletal: Negative for back pain and joint pain.  Skin: Negative.  Negative for itching and rash.  Neurological: Negative for dizziness, tingling, focal weakness, weakness and headaches.  Endo/Heme/Allergies: Does not bruise/bleed easily.  Psychiatric/Behavioral: Negative for depression. The patient is not nervous/anxious and does not have insomnia.       PAST MEDICAL HISTORY :  Past Medical History:  Diagnosis Date  . Allergy   . Anemia   . Asthma   . Atrial fibrillation ([place])   . Cerebrovascular accident [facility])    History of visual deficit  . Chiari I malformation ([place])   . Coronary artery disease   . Depression   . Dyspnea   . Dysrhythmia    atrial fib  . GERD (gastroesophageal reflux disease)   . Hyperlipidemia   . Hypertension   . Hypoparathyroidism ([place])   . Hypothyroidism   . Personality disorder, depressive   . PONV (postoperative nausea and vomiting)    difficulty breathing during endoscopy, colonoscopy performed prior w/out problems  . Renal insufficiency   . Sleep apnea    CPAP  . Thyroid cancer ([place]) 1999   Papillary  . Thyroid disease    hypothyroid     PAST SURGICAL HISTORY :   Past Surgical History:  Procedure Laterality Date  . CANNOT TOLERATE PAP / Virginal    . CATARACT EXTRACTION, BILATERAL    . ELECTROMAGNETIC NAVIGATION BROCHOSCOPY Left 10/24/2018   Procedure: ELECTROMAGNETIC NAVIGATION BRONCHOSCOPY LEFT;  Surgeon: Tyler Pita, MD;  Location: [facility] ORS;  Service: Cardiopulmonary;  Laterality: Left;  . LEFT HEART CATH AND CORONARY ANGIOGRAPHY Right 11/18/2017   Procedure: Left Heart Cath with possible coronary intervention;  Surgeon: Dionisio David,  MD;  Location: [place] CV LAB;  Service: Cardiovascular;  Laterality: Right;  . MRI brain  12/1999  . THYROIDECTOMY    . TONSILLECTOMY    . TOTAL HIP  ARTHROPLASTY Right 06/26/2016   Procedure: TOTAL HIP ARTHROPLASTY ANTERIOR APPROACH;  Surgeon: Hessie Knows, MD;  Location: [facility] ORS;  Service: Orthopedics;  Laterality: Right;    FAMILY HISTORY :   Family History  Problem Relation Age of Onset  . Diabetes Father   . Diabetes Paternal Aunt   . Diabetes Paternal Uncle   . Breast cancer Sister 61    SOCIAL HISTORY:   Social History   Tobacco Use  . Smoking status: Never Smoker  . Smokeless tobacco: Never Used  Substance Use Topics  . Alcohol use: No  . Drug use: No    ALLERGIES:  is allergic to aspirin-dipyridamole er and rosuvastatin.  MEDICATIONS:  Current Outpatient Medications  Medication Sig Dispense Refill  . acetaminophen (TYLENOL) 500 MG tablet Take 500 mg by mouth every 6 (six) hours as needed for mild pain.    Marland Kitchen albuterol (PROVENTIL HFA;VENTOLIN HFA) 108 (90 BASE) MCG/ACT inhaler Inhale 2 puffs into the lungs every 6 (six) hours as needed for wheezing or shortness of breath.     Marland Kitchen apixaban (ELIQUIS) 5 MG TABS tablet Take 5 mg by mouth 2 (two) times daily.     Marland Kitchen atorvastatin (LIPITOR) 80 MG tablet Take 80 mg by mouth at bedtime.     . Azelastine HCl 0.15 % SOLN Place 1 spray into both nostrils 2 (two) times daily. Use in each nostril as directed     . benzonatate (TESSALON) 100 MG capsule Take 100 mg by mouth 3 (three) times daily as needed for cough.     . calcitRIOL (ROCALTROL) 0.25 MCG capsule Take 0.25-0.5 mcg by mouth See admin instructions. Take 2 capsules every morning and 1 capsule every evening.     . calcium carbonate (TUMS E-X 750) 750 MG chewable tablet Chew 1 tablet by mouth at bedtime.     . iron polysaccharides (NIFEREX) 150 MG capsule Take 150 mg by mouth daily.     Marland Kitchen levothyroxine (SYNTHROID, LEVOTHROID) 137 MCG tablet Take 137 mcg by mouth daily before breakfast.     . losartan (COZAAR) 50 MG tablet Take 50 mg by mouth daily.     . montelukast (SINGULAIR) 10 MG tablet Take 10 mg by mouth at bedtime.     . Multiple Vitamin (MULTIVITAMIN) tablet Take 1 tablet by mouth daily.    . niacin 500 MG tablet Take 500 mg by mouth daily.    . nitroGLYCERIN (NITROSTAT) 0.4 MG SL tablet Place 0.4 mg under the tongue every 5 (five) minutes as needed for chest pain.     . potassium chloride SA (K-DUR,KLOR-CON) 20 MEQ tablet Take 20 mEq by mouth daily.     Marland Kitchen Propylene Glycol (SYSTANE BALANCE) 0.6 % SOLN Place 1 drop into both eyes at bedtime.    . sotalol (BETAPACE) 80 MG tablet Take 1 tablet by mouth daily.    . sucralfate (CARAFATE) 1 g tablet Take 1 g by mouth 2 (two) times daily.    . vitamin C (ASCORBIC ACID) 500 MG tablet Take 500 mg by mouth daily.    . budesonide-formoterol (SYMBICORT) 160-4.5 MCG/ACT inhaler Inhale 2 puffs into the lungs 2 (two) times daily. 10.2 g 11  . budesonide-formoterol (SYMBICORT) 160-4.5 MCG/ACT inhaler Inhale 2 puffs into the lungs 2 (two) times daily for 1  day. 1 Inhaler 0  . Spacer/Aero-Holding Chambers (AEROCHAMBER MV) inhaler Use as instructed 1 each 0   No current facility-administered medications for this visit.     PHYSICAL EXAMINATION: ECOG PERFORMANCE STATUS: 1 - Symptomatic but completely ambulatory  BP (!) 154/82 (BP Location: Right Arm, Patient Position: Sitting, Cuff Size: Normal)   Pulse 69   Temp 99.1 F (37.3 C) (Tympanic)   Wt 150 lb (68 kg)   SpO2 99%   BMI 25.75 kg/m   Filed Weights   04/08/19 1339  Weight: 150 lb (68 kg)    Physical Exam  Constitutional: She is oriented to person, place, and time and well-developed, well-nourished, and in no distress.  Alone.  HENT:  Head: Normocephalic and atraumatic.  Mouth/Throat: Oropharynx is clear and moist. No oropharyngeal exudate.  Eyes: Pupils are equal, round, and reactive to light.  Neck: Normal range of motion. Neck supple.  Cardiovascular: Normal rate and regular rhythm.  Pulmonary/Chest: No respiratory distress. She has no wheezes.  Decreased air entry bilaterally.  Abdominal: Soft.  Bowel sounds are normal. She exhibits no distension and no mass. There is no abdominal tenderness. There is no rebound and no guarding.  Musculoskeletal: Normal range of motion.        General: No tenderness or edema.  Neurological: She is alert and oriented to person, place, and time.  Skin: Skin is warm.  Psychiatric: Affect normal.       LABORATORY DATA:  I have reviewed the data as listed    Component Value Date/Time   NA 143 04/08/2019 1314   NA 139 09/03/2013 1947   K 3.6 04/08/2019 1314   K 3.7 09/03/2013 1947   CL 109 04/08/2019 1314   CL 104 09/03/2013 1947   CO2 27 04/08/2019 1314   CO2 28 09/03/2013 1947   GLUCOSE 101 (H) 04/08/2019 1314   GLUCOSE 123 (H) 09/03/2013 1947   BUN 28 (H) 04/08/2019 1314   BUN 22 (H) 09/03/2013 1947   CREATININE 1.11 (H) 04/08/2019 1314   CREATININE 0.96 09/03/2013 1947   CALCIUM 9.7 04/08/2019 1314   CALCIUM 7.7 (L) 09/03/2013 1947   CALCIUM 8.3 (L) 12/19/2009 0000   PROT 6.7 04/08/2019 1314   PROT 7.4 09/03/2013 1947   ALBUMIN 4.0 04/08/2019 1314   ALBUMIN 3.6 09/03/2013 1947   AST 24 04/08/2019 1314   AST 19 09/03/2013 1947   ALT 32 04/08/2019 1314   ALT 22 09/03/2013 1947   ALKPHOS 104 04/08/2019 1314   ALKPHOS 103 09/03/2013 1947   BILITOT 0.7 04/08/2019 1314   BILITOT 0.5 09/03/2013 1947   GFRNONAA 53 (L) 04/08/2019 1314   GFRNONAA >60 09/03/2013 1947   GFRAA >60 04/08/2019 1314   GFRAA >60 09/03/2013 1947    No results found for: SPEP, UPEP  Lab Results  Component Value Date   WBC 6.9 04/08/2019   NEUTROABS 4.3 04/08/2019   HGB 11.4 (L) 04/08/2019   HCT 37.0 04/08/2019   MCV 91.6 04/08/2019   PLT 210 04/08/2019      Chemistry      Component Value Date/Time   NA 143 04/08/2019 1314   NA 139 09/03/2013 1947   K 3.6 04/08/2019 1314   K 3.7 09/03/2013 1947   CL 109 04/08/2019 1314   CL 104 09/03/2013 1947   CO2 27 04/08/2019 1314   CO2 28 09/03/2013 1947   BUN 28 (H) 04/08/2019 1314   BUN 22 (H)  09/03/2013 1947   CREATININE 1.11 (H)  04/08/2019 1314   CREATININE 0.96 09/03/2013 1947      Component Value Date/Time   CALCIUM 9.7 04/08/2019 1314   CALCIUM 7.7 (L) 09/03/2013 1947   CALCIUM 8.3 (L) 12/19/2009 0000   ALKPHOS 104 04/08/2019 1314   ALKPHOS 103 09/03/2013 1947   AST 24 04/08/2019 1314   AST 19 09/03/2013 1947   ALT 32 04/08/2019 1314   ALT 22 09/03/2013 1947   BILITOT 0.7 04/08/2019 1314   BILITOT 0.5 09/03/2013 1947       RADIOGRAPHIC STUDIES: I have personally reviewed the radiological images as listed and agreed with the findings in the report. No results found.   ASSESSMENT & PLAN:  Multiple lung nodules # Multiple lung nodules/groudn glass opacities [at least since 2015]; NOV CT scan shows slight progression of the bilateral lung groundglass opacities by few millimeters [compared to scan November 2019]; however left lower lobe lung nodule approximately ~ 22mm-concerning for malignancy [previous biopsy inconclusive]-Overall STABLE.  #Await evaluation with pulmonary later this week re: lung Follow-up.  # Anemia/CKD-stage III; hemoglobin around 11.4; STABLE; check iron studies next visit   # Thyroid cancer-s/p RAIU [2000]- stable/ no recurrence.Tumor makers- stable; TSH- 0.02 at goal.   # DISPOSITION: # follow up in 6 months- MD; labs-cbc.cmp/iron studies/ferrritin--Dr.B   Cc; Dr.Gonzalez   Orders Placed This Encounter  Procedures  . CBC with Differential    Standing Status:   Future    Standing Expiration Date:   04/07/2020  . Comprehensive metabolic panel    Standing Status:   Future    Standing Expiration Date:   04/07/2020  . Ferritin    Standing Status:   Future    Standing Expiration Date:   04/07/2020  . Iron and TIBC    Standing Status:   Future    Standing Expiration Date:   04/07/2020  . TgAb+Thyroglobulin IMA or RIA    Standing Status:   Future    Standing Expiration Date:   04/09/2020   All questions were answered. The patient  knows to call the clinic with any problems, questions or concerns.      Cammie Sickle, MD 04/10/2019 2:28 PM

## 2019-04-09 LAB — THYROID PANEL WITH TSH
Free Thyroxine Index: 2.9 (ref 1.2–4.9)
T3 Uptake Ratio: 31 % (ref 24–39)
T4, Total: 9.5 ug/dL (ref 4.5–12.0)
TSH: 0.221 u[IU]/mL — ABNORMAL LOW (ref 0.450–4.500)

## 2019-04-10 ENCOUNTER — Encounter: Payer: Self-pay | Admitting: Pulmonary Disease

## 2019-04-10 ENCOUNTER — Other Ambulatory Visit: Payer: Self-pay

## 2019-04-10 ENCOUNTER — Ambulatory Visit (INDEPENDENT_AMBULATORY_CARE_PROVIDER_SITE_OTHER): Payer: Medicare HMO | Admitting: Pulmonary Disease

## 2019-04-10 VITALS — BP 144/88 | HR 87 | Temp 97.4°F | Ht 64.0 in | Wt 147.8 lb

## 2019-04-10 DIAGNOSIS — Z85850 Personal history of malignant neoplasm of thyroid: Secondary | ICD-10-CM

## 2019-04-10 DIAGNOSIS — R918 Other nonspecific abnormal finding of lung field: Secondary | ICD-10-CM | POA: Diagnosis not present

## 2019-04-10 DIAGNOSIS — R911 Solitary pulmonary nodule: Secondary | ICD-10-CM

## 2019-04-10 DIAGNOSIS — G4733 Obstructive sleep apnea (adult) (pediatric): Secondary | ICD-10-CM

## 2019-04-10 DIAGNOSIS — J45909 Unspecified asthma, uncomplicated: Secondary | ICD-10-CM | POA: Diagnosis not present

## 2019-04-10 DIAGNOSIS — I480 Paroxysmal atrial fibrillation: Secondary | ICD-10-CM

## 2019-04-10 MED ORDER — BUDESONIDE-FORMOTEROL FUMARATE 160-4.5 MCG/ACT IN AERO: 2.0000 | INHALATION_SPRAY | Freq: Two times a day (BID) | RESPIRATORY_TRACT | 11 refills | Status: DC

## 2019-04-10 MED ORDER — AEROCHAMBER MV MISC: 0 refills | Status: DC

## 2019-04-10 MED ORDER — BUDESONIDE-FORMOTEROL FUMARATE 160-4.5 MCG/ACT IN AERO: 2.0000 | INHALATION_SPRAY | Freq: Two times a day (BID) | RESPIRATORY_TRACT | 0 refills | Status: DC

## 2019-04-10 NOTE — Patient Instructions (Signed)
1.  We are going to switch your Advair to Symbicort 160/4.5, 2 puffs twice a day.  Use your spacer.  Rinse your mouth well after use but a little baking soda and rinsing water.   2.  Continue all other medications as noted on your medication sheet.  3.  We will see you in 4 months time with a CT chest done before or on that day.

## 2019-04-10 NOTE — Progress Notes (Signed)
Subjective:    Patient ID: Brenda Barber, female    DOB: 05-06-1956, 63 y.o.   MRN: [id number]  HPI The patient is a 63 year old very complex lifelong never smoker, who presents for follow-up of multiple lung nodules.  She underwent navigational bronchoscopy performed on 24 October 2018 with biopsy to the dominant nodule on the LEFT lower lobe.  Patient also had a groundglass opacity on the LEFT upper lobe which was brushed.  Unfortunately during the procedure in the microscopy was not available which would have been crucial due to the nature of the lesions which is mostly semisolid to solid.  The procedure did not yield a definitive diagnosis.  The concern is potential metastatic thyroid cancer.  Patient has been asymptomatic in this regard.  I have discussed with Dr. Rogue Bussing and the patient was discussed and tumor board as well with the consensus being to follow 3 months CT.  She is following up after her CT performed on 06 April 2019.  This has been reviewed in detail independently.  The groundglass opacities have not undergone any change.  The solid left lower lobe nodule is stable from 2019.  It has had some very indolent growth since 2015.  The differential remains potential benign granuloma versus indolent malignancy.  Overall however there has been no change from a years time.  With regards to the groundglass opacities the diagnosis is vast could be secondary to metastatic disease, inflammation and/or drug reaction (patient is on amiodarone).  Patient has been totally asymptomatic since her initial visit here.  She has had no issues with dyspnea, cough, sputum production, fevers, chills or sweats.  He does report difficulty getting her Advair inhaler, reviewing coverage it appears Symbicort is covered.  She does have a history of asthma. She has been following strict mask when in public areas and has been practicing social distancing.    Review of Systems A 10 point review of systems was  performed and it is as noted above otherwise negative.  Patient Active Problem List   Diagnosis Date Noted  . Facial pain 12/02/2017  . Headache disorder 12/02/2017  . Memory loss or impairment 12/02/2017  . Unstable angina ([place]) 11/14/2017  . Thyroid cancer ([place]) 09/09/2017  . CKD (chronic kidney disease) stage 3, GFR 30-59 ml/min (HCC) 01/16/2017  . Primary localized osteoarthritis of right hip 06/26/2016  . Anemia, unspecified 03/02/2016  . Ileus ([place])   . Emesis   . Abdominal pain 01/13/2016  . Chiari I malformation ([place]) 06/01/2015  . Paroxysmal A-fib ([place]) 06/01/2015  . Excessive falling 04/21/2015  . Repeated falls 04/21/2015  . Seizure ([place]) 03/16/2015  . Transient alteration of awareness 03/16/2015  . Postsurgical hypoparathyroidism ([place]) 10/31/2014  . History of thyroid cancer 04/26/2014  . Post-surgical hypothyroidism 04/26/2014  . Sleep apnea, obstructive 04/19/2014  . Multiple lung nodules 11/09/2013  . Personality disorder, depressive 05/08/2011  . GERD 12/23/2009  . HYPERGLYCEMIA 12/23/2009  . 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 GASTROENTERITIS, VIRAL 08/01/2007  . Hypothyroidism 05/23/2007  . HYPOPARATHYROIDISM 08/12/2006  . HYPERLIPIDEMIA 08/12/2006  . CORNEAL DISORDER 08/12/2006  . Benign essential hypertension 08/12/2006  . ALLERGIC RHINITIS 08/12/2006  . REACTIVE AIRWAY DISEASE 08/12/2006  . POSTMENOPAUSAL STATUS 08/12/2006  . ROSACEA 08/12/2006  . COUGH, CHRONIC 08/12/2006  . CEREBROVASCULAR ACCIDENT, HX OF 08/12/2006  . MIGRAINES, HX OF 08/12/2006       Objective:   Physical Exam BP (!) 144/88 (BP Location: Left Arm, Cuff Size: Normal)   Pulse 87  Temp (!) 97.4 F (36.3 C) (Temporal)   Ht 5\' 4"  (1.626 m)   Wt 147 lb 12.8 oz (67 kg)   SpO2 98%   BMI 25.37 kg/m  GENERAL: Well-developed somewhat overweight woman, no acute distress.  She wears bilateral hearing aids.  Mild speech impediment due to [facility]. HEAD: Normocephalic, atraumatic.  EYES:  Pupils equal, round, reactive to light.  No scleral icterus.  MOUTH: Nose/mouth/throat not examined due to masking requirements for COVID 19.Marland Kitchen NECK: Supple. No thyromegaly. Trachea midline. No JVD.  No adenopathy. PULMONARY: Good air entry bilaterally.  No adventitious sounds. CARDIOVASCULAR: S1 and S2. Regular rate and rhythm.  Soft grade 1/6 SEM, LSB. ABDOMEN: Benign. MUSCULOSKELETAL: No joint deformity, no clubbing, no edema.  NEUROLOGIC: No overt focal deficit.  Hard of hearing, no gait disturbance.  Mild speech impediment due to [facility]. SKIN: Intact,warm,dry.  On limited exam no rashes PSYCH: Mood and behavior normal.  Representative slices from the CT performed 06 April 2019:   Left upper lobe groundglass nodule (1 of multiple):     Left lower lobe solid nodule:    Assessment & Plan:     ICD-10-CM   1. Lung nodule, multiple  R91.8 CT CHEST WO CONTRAST   Status post navigational bronchoscopy June 2020, nondiagnostic Continue to monitor stable from CT 1 year ago   2. Persistent asthma without complication, unspecified asthma severity  J45.909    Followed by Dr. Raul Del Difficulty obtaining Advair Assisted patient in getting Symbicort  3. History of thyroid cancer  Z85.850    This issue adds complexity to her management  4. Solitary pulmonary nodule  R91.1 CT CHEST WO CONTRAST   CT chest ordered 4 months   Meds ordered this encounter  Medications  . budesonide-formoterol (SYMBICORT) 160-4.5 MCG/ACT inhaler    Sig: Inhale 2 puffs into the lungs 2 (two) times daily.    Dispense:  10.2 g    Refill:  11  . budesonide-formoterol (SYMBICORT) 160-4.5 MCG/ACT inhaler    Sig: Inhale 2 puffs into the lungs 2 (two) times daily for 1 day.    Dispense:  1 Inhaler    Refill:  0    Order Specific Question:   Lot Number?    Answer:   [id number]    Order Specific Question:   Expiration Date?    Answer:   11/19/2019    Order Specific Question:   Manufacturer?    Answer:    [employer] [[id number]]    Order Specific Question:   Quantity    Answer:   1  . Spacer/Aero-Holding Chambers (AEROCHAMBER MV) inhaler    Sig: Use as instructed    Dispense:  1 each    Refill:  0   Orders Placed This Encounter  Procedures  . CT CHEST WO CONTRAST    March 2021    Standing Status:   Future    Number of Occurrences:   1    Standing Expiration Date:   06/09/2020    Order Specific Question:   Preferred imaging location?    Answer:   [facility]    Order Specific Question:   Radiology Contrast Protocol - do NOT remove file path    Answer:   [email or web address]   We will see the patient in follow-up after the above CT done.  For her general pulmonary follow-up she follows with Dr. Raul Del.  She is following here strictly for advanced bronchoscopic needs.  Renold Don, MD [facility]   *This note  was dictated using voice recognition software/Dragon.  Despite best efforts to proofread, errors can occur which can change the meaning.  Any change was purely unintentional.

## 2019-04-15 DIAGNOSIS — G4733 Obstructive sleep apnea (adult) (pediatric): Secondary | ICD-10-CM | POA: Diagnosis not present

## 2019-04-21 DIAGNOSIS — J4520 Mild intermittent asthma, uncomplicated: Secondary | ICD-10-CM | POA: Diagnosis not present

## 2019-04-21 DIAGNOSIS — G4733 Obstructive sleep apnea (adult) (pediatric): Secondary | ICD-10-CM | POA: Diagnosis not present

## 2019-05-11 DIAGNOSIS — H35371 Puckering of macula, right eye: Secondary | ICD-10-CM | POA: Diagnosis not present

## 2019-05-15 DIAGNOSIS — G4733 Obstructive sleep apnea (adult) (pediatric): Secondary | ICD-10-CM | POA: Diagnosis not present

## 2019-05-19 DIAGNOSIS — G4733 Obstructive sleep apnea (adult) (pediatric): Secondary | ICD-10-CM | POA: Diagnosis not present

## 2019-06-01 DIAGNOSIS — R0602 Shortness of breath: Secondary | ICD-10-CM | POA: Diagnosis not present

## 2019-06-01 DIAGNOSIS — I340 Nonrheumatic mitral (valve) insufficiency: Secondary | ICD-10-CM | POA: Diagnosis not present

## 2019-06-01 DIAGNOSIS — I10 Essential (primary) hypertension: Secondary | ICD-10-CM | POA: Diagnosis not present

## 2019-06-01 DIAGNOSIS — E785 Hyperlipidemia, unspecified: Secondary | ICD-10-CM | POA: Diagnosis not present

## 2019-06-01 DIAGNOSIS — I2510 Atherosclerotic heart disease of native coronary artery without angina pectoris: Secondary | ICD-10-CM | POA: Diagnosis not present

## 2019-06-01 DIAGNOSIS — I4891 Unspecified atrial fibrillation: Secondary | ICD-10-CM | POA: Diagnosis not present

## 2019-06-15 DIAGNOSIS — G4733 Obstructive sleep apnea (adult) (pediatric): Secondary | ICD-10-CM | POA: Diagnosis not present

## 2019-06-16 ENCOUNTER — Ambulatory Visit: Payer: Medicare HMO | Attending: Internal Medicine

## 2019-06-16 DIAGNOSIS — Z20822 Contact with and (suspected) exposure to covid-19: Secondary | ICD-10-CM

## 2019-06-17 LAB — NOVEL CORONAVIRUS, NAA: SARS-CoV-2, NAA: NOT DETECTED

## 2019-06-25 ENCOUNTER — Telehealth: Payer: Self-pay | Admitting: Pulmonary Disease

## 2019-06-25 NOTE — Telephone Encounter (Signed)
I called and spoke with pt. She is seeing DG and Dr. Vella Kohler at [facility]. Pt states she would like to continue care with Dr. Vella Kohler. Nothing further needed

## 2019-06-30 DIAGNOSIS — R918 Other nonspecific abnormal finding of lung field: Secondary | ICD-10-CM | POA: Diagnosis not present

## 2019-06-30 DIAGNOSIS — Z0000 Encounter for general adult medical examination without abnormal findings: Secondary | ICD-10-CM | POA: Diagnosis not present

## 2019-06-30 DIAGNOSIS — Z1239 Encounter for other screening for malignant neoplasm of breast: Secondary | ICD-10-CM | POA: Diagnosis not present

## 2019-06-30 DIAGNOSIS — Z85850 Personal history of malignant neoplasm of thyroid: Secondary | ICD-10-CM | POA: Diagnosis not present

## 2019-06-30 DIAGNOSIS — M5410 Radiculopathy, site unspecified: Secondary | ICD-10-CM | POA: Diagnosis not present

## 2019-06-30 DIAGNOSIS — R413 Other amnesia: Secondary | ICD-10-CM | POA: Diagnosis not present

## 2019-06-30 DIAGNOSIS — G935 Compression of brain: Secondary | ICD-10-CM | POA: Diagnosis not present

## 2019-06-30 DIAGNOSIS — I10 Essential (primary) hypertension: Secondary | ICD-10-CM | POA: Diagnosis not present

## 2019-07-06 DIAGNOSIS — R911 Solitary pulmonary nodule: Secondary | ICD-10-CM | POA: Diagnosis not present

## 2019-07-06 DIAGNOSIS — Z0000 Encounter for general adult medical examination without abnormal findings: Secondary | ICD-10-CM | POA: Diagnosis not present

## 2019-07-06 DIAGNOSIS — M79652 Pain in left thigh: Secondary | ICD-10-CM | POA: Diagnosis not present

## 2019-07-06 DIAGNOSIS — D649 Anemia, unspecified: Secondary | ICD-10-CM | POA: Diagnosis not present

## 2019-07-06 DIAGNOSIS — G8929 Other chronic pain: Secondary | ICD-10-CM | POA: Diagnosis not present

## 2019-07-06 DIAGNOSIS — G4733 Obstructive sleep apnea (adult) (pediatric): Secondary | ICD-10-CM | POA: Diagnosis not present

## 2019-07-06 DIAGNOSIS — M25552 Pain in left hip: Secondary | ICD-10-CM | POA: Diagnosis not present

## 2019-07-06 DIAGNOSIS — G935 Compression of brain: Secondary | ICD-10-CM | POA: Diagnosis not present

## 2019-07-06 DIAGNOSIS — E892 Postprocedural hypoparathyroidism: Secondary | ICD-10-CM | POA: Diagnosis not present

## 2019-07-06 DIAGNOSIS — J449 Chronic obstructive pulmonary disease, unspecified: Secondary | ICD-10-CM | POA: Diagnosis not present

## 2019-07-07 DIAGNOSIS — E892 Postprocedural hypoparathyroidism: Secondary | ICD-10-CM | POA: Diagnosis not present

## 2019-07-07 DIAGNOSIS — Z85850 Personal history of malignant neoplasm of thyroid: Secondary | ICD-10-CM | POA: Diagnosis not present

## 2019-07-07 DIAGNOSIS — E890 Postprocedural hypothyroidism: Secondary | ICD-10-CM | POA: Diagnosis not present

## 2019-07-07 DIAGNOSIS — M8588 Other specified disorders of bone density and structure, other site: Secondary | ICD-10-CM | POA: Diagnosis not present

## 2019-07-09 IMAGING — CT CT CHEST W/O CM
2 of 4 series · 15 of 36 positions shown, 18 images · non-contrast
Comparison: 09/08/2015, and 06/23/2013

CLINICAL DATA: Followup indeterminate pulmonary nodules.

EXAM:
CT CHEST WITHOUT CONTRAST
TECHNIQUE: Multidetector CT imaging of the chest was performed following the
standard protocol without IV contrast.

[Series 2: chest · axial · 0.62mm/px · z∈[-1201,-955]mm · 12 of 147 slices shown, 15 images (1 of 2)]
[im 12/147  mediastinal]
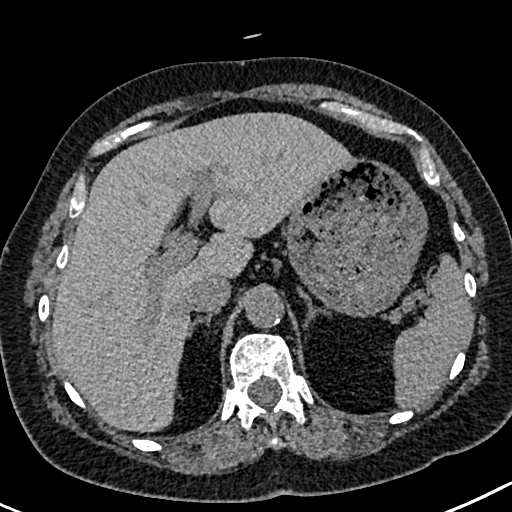
[im 12/147  lung]
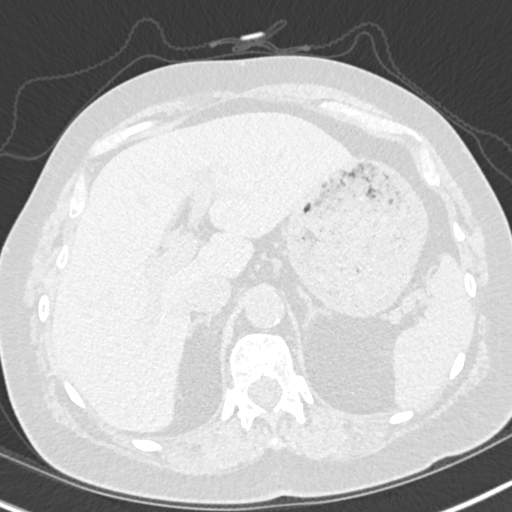
[im 23/147  lung]
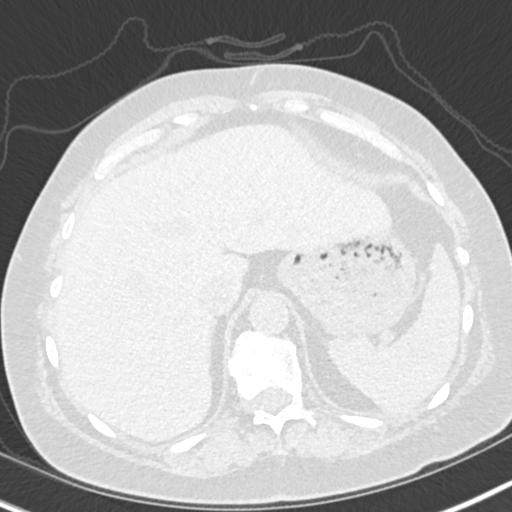
[im 34/147  lung]
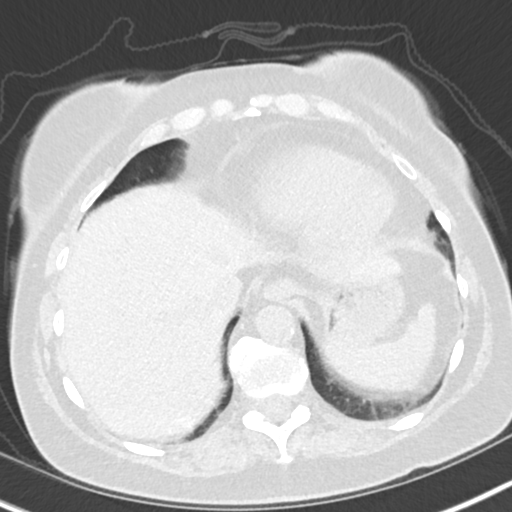
[im 45/147  lung]
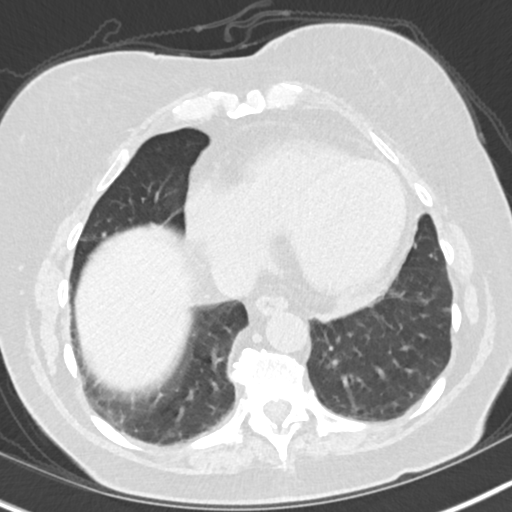
[im 57/147  mediastinal]
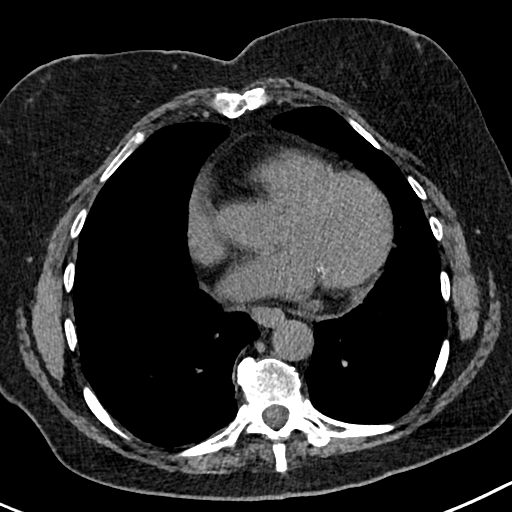
[im 57/147  lung]
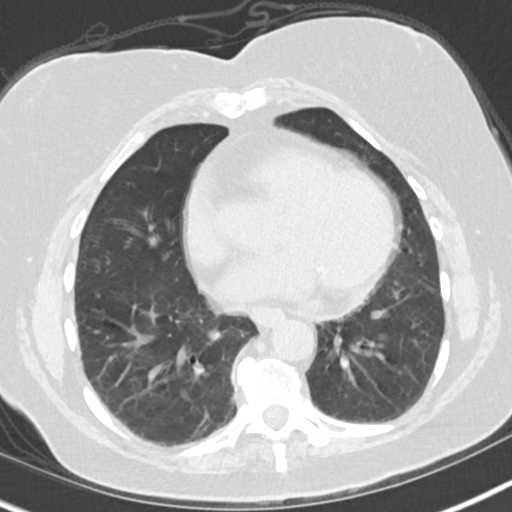
[im 68/147  lung]
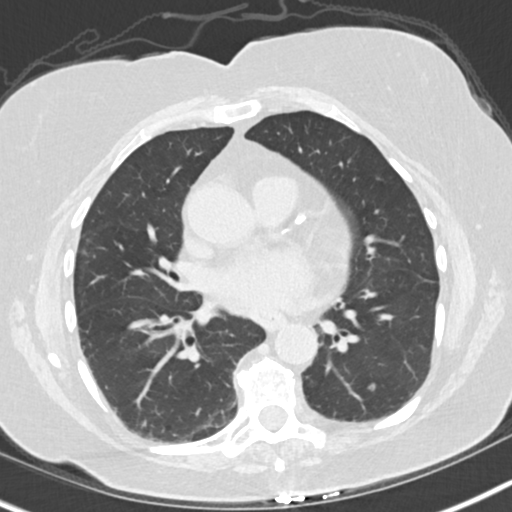
[im 79/147  lung]
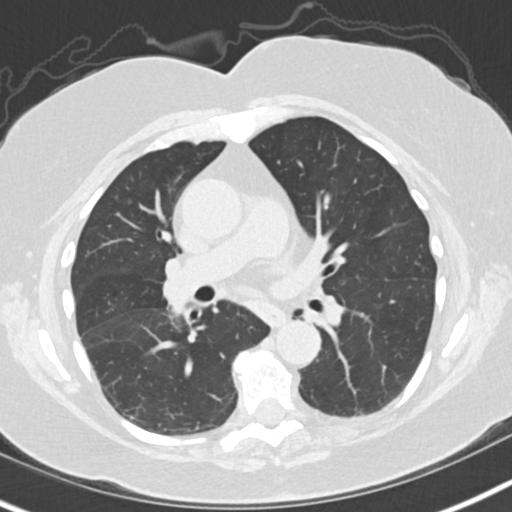
[im 90/147  lung]
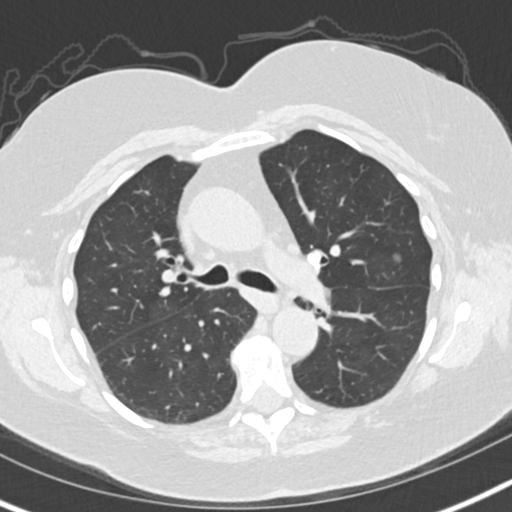
[im 102/147  mediastinal]
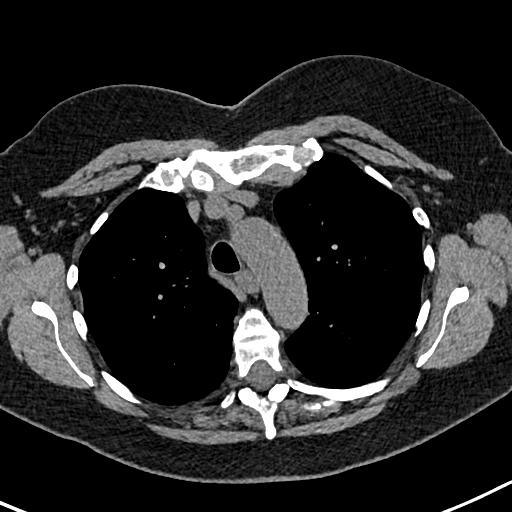
[im 102/147  lung]
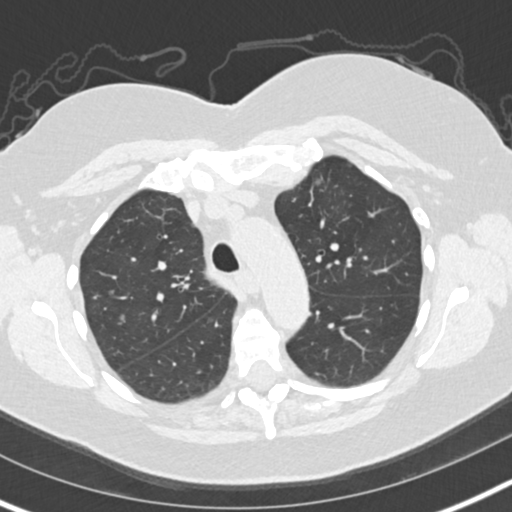
[im 113/147  lung]
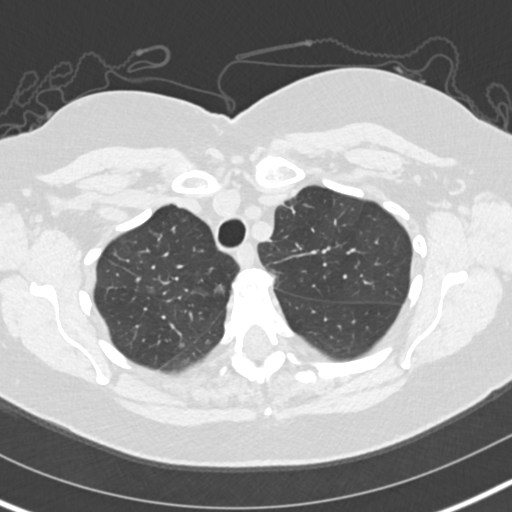
[im 124/147  lung]
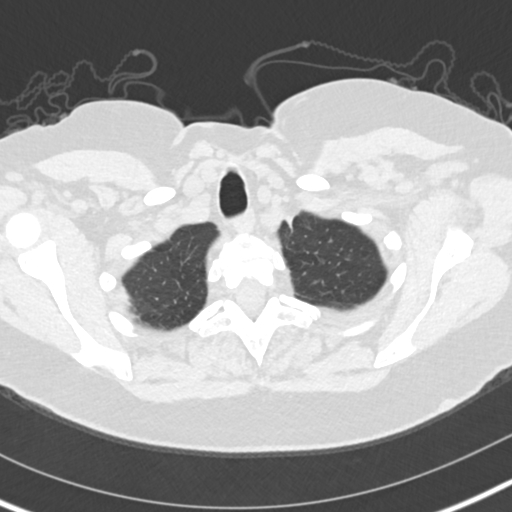
[im 135/147  lung]
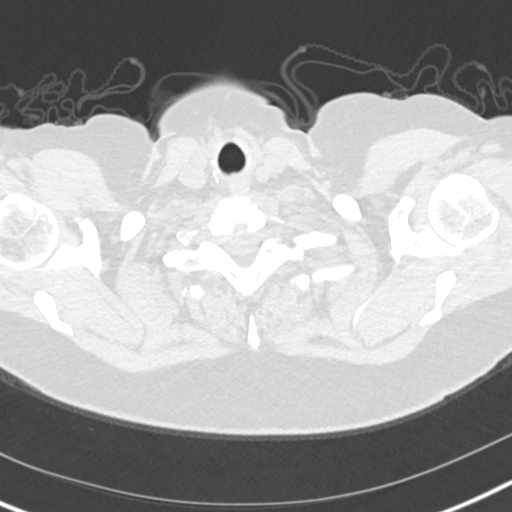

[Series 5: chest · coronal · 0.58mm/px · 3 of 149 slices shown (2 of 2)]
[im 30/149  lung]
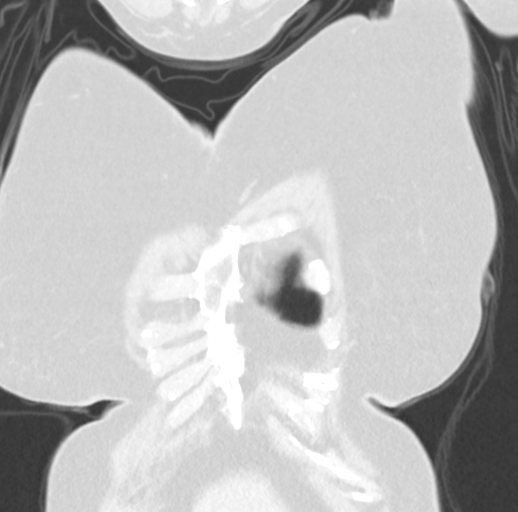
[im 60/149  lung]
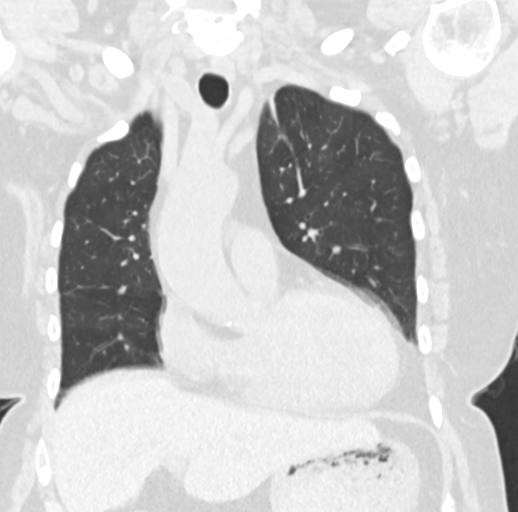
[im 89/149  lung]
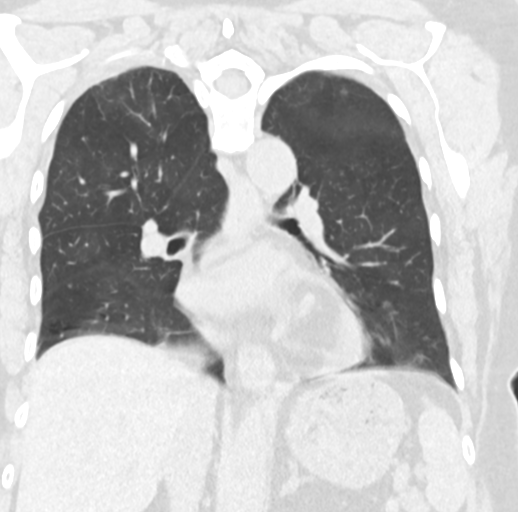

[15 of 36 positions shown; findings below may reference images not displayed]

FINDINGS: Cardiovascular: No acute findings. Aortic and coronary artery
atherosclerosis.

Mediastinum/Nodes: No masses or pathologically enlarged lymph nodes
identified on this unenhanced exam.

Lungs/Pleura: Respiratory motion artifact noted mainly in the lung
bases. Multiple ground-glass nodules are seen in both lungs. Several
of these show mild increase in size and density compared to previous
studies dating back to 1603. These measure 16 mm in left lung apex
on image [DATE] compared to 14 mm previously, 10 mm in anterior left
upper lobe on image 43/3 compared to 6 mm previously, 7 mm in the
posterior left upper lobe on image 58/3 compared to 3 mm previously,
7 mm in the posterior right upper lobe on image 44/3 compared to 5
mm previously, and 8 mm in the medial right upper lobe on image 35/3
which is new. A 9 mm solid pulmonary nodule is seen in the left
lower lobe on image 97/3 which measures 5 mm previously. These are
suspicious for synchronous low-grade lung adenocarcinomas. No
evidence of pleural effusion.

Upper Abdomen:  Unremarkable.

Musculoskeletal:  No suspicious bone lesions.
IMPRESSION: Multiple bilateral ground-glass pulmonary nodules, several showing
mild increase in size and density compared to previous studies
dating back to 1603. A 9 mm solid nodule in left lower lobe has also
increased in size. These findings are suspicious for synchronous
low-grade lung adenocarcinomas.

No evidence of lymphadenopathy or pleural effusion.

Aortic Atherosclerosis ([id number]). Coronary artery calcification.

## 2019-07-14 DIAGNOSIS — M25552 Pain in left hip: Secondary | ICD-10-CM | POA: Diagnosis not present

## 2019-07-15 DIAGNOSIS — R413 Other amnesia: Secondary | ICD-10-CM | POA: Diagnosis not present

## 2019-07-15 DIAGNOSIS — R519 Headache, unspecified: Secondary | ICD-10-CM | POA: Diagnosis not present

## 2019-07-15 DIAGNOSIS — R296 Repeated falls: Secondary | ICD-10-CM | POA: Diagnosis not present

## 2019-07-16 DIAGNOSIS — G4733 Obstructive sleep apnea (adult) (pediatric): Secondary | ICD-10-CM | POA: Diagnosis not present

## 2019-07-17 DIAGNOSIS — Z79899 Other long term (current) drug therapy: Secondary | ICD-10-CM | POA: Diagnosis not present

## 2019-07-21 DIAGNOSIS — M25552 Pain in left hip: Secondary | ICD-10-CM | POA: Diagnosis not present

## 2019-07-23 DIAGNOSIS — M549 Dorsalgia, unspecified: Secondary | ICD-10-CM | POA: Diagnosis not present

## 2019-07-23 DIAGNOSIS — M79605 Pain in left leg: Secondary | ICD-10-CM | POA: Diagnosis not present

## 2019-07-27 ENCOUNTER — Ambulatory Visit: Payer: Medicare HMO | Admitting: Pulmonary Disease

## 2019-07-28 DIAGNOSIS — M549 Dorsalgia, unspecified: Secondary | ICD-10-CM | POA: Diagnosis not present

## 2019-07-28 DIAGNOSIS — M79605 Pain in left leg: Secondary | ICD-10-CM | POA: Diagnosis not present

## 2019-07-29 ENCOUNTER — Ambulatory Visit
Admission: RE | Admit: 2019-07-29 | Discharge: 2019-07-29 | Disposition: A | Discharge status: Home / Self Care | Payer: Medicare HMO | Source: Ambulatory Visit | Attending: Pulmonary Disease | Admitting: Pulmonary Disease

## 2019-07-29 ENCOUNTER — Other Ambulatory Visit: Payer: Self-pay

## 2019-07-29 DIAGNOSIS — R911 Solitary pulmonary nodule: Secondary | ICD-10-CM | POA: Insufficient documentation

## 2019-07-29 DIAGNOSIS — R918 Other nonspecific abnormal finding of lung field: Secondary | ICD-10-CM | POA: Insufficient documentation

## 2019-07-30 DIAGNOSIS — J45998 Other asthma: Secondary | ICD-10-CM | POA: Diagnosis not present

## 2019-07-30 DIAGNOSIS — I4891 Unspecified atrial fibrillation: Secondary | ICD-10-CM | POA: Diagnosis not present

## 2019-07-30 DIAGNOSIS — I2510 Atherosclerotic heart disease of native coronary artery without angina pectoris: Secondary | ICD-10-CM | POA: Diagnosis not present

## 2019-07-30 DIAGNOSIS — I10 Essential (primary) hypertension: Secondary | ICD-10-CM | POA: Diagnosis not present

## 2019-07-30 DIAGNOSIS — E785 Hyperlipidemia, unspecified: Secondary | ICD-10-CM | POA: Diagnosis not present

## 2019-07-31 DIAGNOSIS — M25552 Pain in left hip: Secondary | ICD-10-CM | POA: Diagnosis not present

## 2019-08-05 ENCOUNTER — Other Ambulatory Visit: Payer: Self-pay

## 2019-08-05 ENCOUNTER — Ambulatory Visit (INDEPENDENT_AMBULATORY_CARE_PROVIDER_SITE_OTHER): Payer: Medicare HMO | Admitting: Acute Care

## 2019-08-05 ENCOUNTER — Encounter: Payer: Self-pay | Admitting: Acute Care

## 2019-08-05 VITALS — BP 140/80 | HR 74 | Temp 97.0°F | Ht 64.0 in | Wt 151.0 lb

## 2019-08-05 DIAGNOSIS — R918 Other nonspecific abnormal finding of lung field: Secondary | ICD-10-CM

## 2019-08-05 DIAGNOSIS — Z0000 Encounter for general adult medical examination without abnormal findings: Secondary | ICD-10-CM | POA: Diagnosis not present

## 2019-08-05 DIAGNOSIS — G4733 Obstructive sleep apnea (adult) (pediatric): Secondary | ICD-10-CM

## 2019-08-05 DIAGNOSIS — Z9989 Dependence on other enabling machines and devices: Secondary | ICD-10-CM

## 2019-08-05 DIAGNOSIS — J45909 Unspecified asthma, uncomplicated: Secondary | ICD-10-CM

## 2019-08-05 NOTE — Progress Notes (Signed)
History of Present Illness Brenda Barber is a 64 y.o. female never smoker followed for pulmonary nodules and asthma. She is followed by Dr. Duwayne Barber   08/05/2019  Pt. Presents for follow up. She is here for results of her Surveillance CT Chest to evaluate her pulmonary nodules.We have reviewed the results of her scan ( Noted below). She has been compliant with her Symbicort daily. She has not had any issues with her asthma. She has not had her Covid vaccine. I have encouraged her to get a vaccine scheduled.She is practicing social distancing and she is wearing a mask whenever she leaves home.She denies any fever, chest pain, orthopnea or hemoptysis.   Test Results: 07/29/2019 CT Chest without Contrast Numerous redemonstrated ground-glass pulmonary nodules as detailed above. Again noted that some of these nodules have clearly demonstrated slow growth over time and multifocal indolent or minimally invasive adenocarcinoma remain a consideration. There are no new concerning features such as solid or spiculated components.  2. Redemonstrated solid nodule of the left lower lobe measuring 1.2 x 1.0 cm, again not significantly changed compared to recent prior examinations although clearly enlarged over time.  I reviewed these results personally with Dr. Duwayne Barber.  Echo 11/18/2017 Normal coronaries an normal LVEF.    BMP Latest Ref Rng & Units 04/08/2019 10/03/2018 04/02/2018  Glucose 70 - 99 mg/dL 101(H) 130(H) 102(H)  BUN 8 - 23 mg/dL 28(H) 27(H) 27(H)  Creatinine 0.44 - 1.00 mg/dL 1.11(H) 1.51(H) 1.57(H)  Sodium 135 - 145 mmol/L 143 144 146(H)  Potassium 3.5 - 5.1 mmol/L 3.6 3.4(L) 3.4(L)  Chloride 98 - 111 mmol/L 109 108 109  CO2 22 - 32 mmol/L 27 27 29   Calcium 8.9 - 10.3 mg/dL 9.7 10.4(H) 10.8(H)    BNP    Component Value Date/Time   BNP 345 (H) 01/10/2012 0825    ProBNP No results found for: PROBNP  PFT No results found for: FEV1PRE, FEV1POST, FVCPRE, FVCPOST, TLC,  DLCOUNC, PREFEV1FVCRT, PSTFEV1FVCRT  CT CHEST WO CONTRAST  Result Date: 07/30/2019 CLINICAL DATA:  Follow-up lung nodules, history of papillary thyroid cancer EXAM: CT CHEST WITHOUT CONTRAST TECHNIQUE: Multidetector CT imaging of the chest was performed following the standard protocol without IV contrast. COMPARISON:  04/06/2019, 10/20/2018, 09/30/2018, 09/08/2015, 02/11/2014 FINDINGS: Cardiovascular: Aortic atherosclerosis. Normal heart size. Coronary artery calcifications. No pericardial effusion. Mediastinum/Nodes: No enlarged mediastinal, hilar, or axillary lymph nodes. Status post thyroidectomy. Trachea, and esophagus demonstrate no significant findings. Lungs/Pleura: Numerous, redemonstrated ground-glass pulmonary nodules are present bilaterally. The largest nodule in the left pulmonary apex again measures 1.4 x 1.0 cm and is unchanged. Some of these nodules have clearly demonstrated very slow growth over time in comparison to priors, for example a nodule in the anterior left upper lobe measuring 1.0 x 0.9 cm, previously 0.6 x 0.5 cm on remote prior examination dated 02/11/2014. There are no solid components appreciated to these ground-glass nodules. There is a redemonstrated solid nodule of the left lower lobe measuring 1.2 x 1.0 cm, again not significantly changed compared to recent prior examinations although clearly enlarged over time on examinations dating back to 2015, at which time it measured 0.7 x 0.5 cm (series 3, image 96). No pleural effusion or pneumothorax. Upper Abdomen: No acute abnormality. Musculoskeletal: No chest wall mass or suspicious bone lesions identified. IMPRESSION: 1. Numerous redemonstrated ground-glass pulmonary nodules as detailed above. Again noted that some of these nodules have clearly demonstrated slow growth over time and multifocal indolent or minimally invasive adenocarcinoma remain a  consideration. There are no new concerning features such as solid or spiculated  components. 2. Redemonstrated solid nodule of the left lower lobe measuring 1.2 x 1.0 cm, again not significantly changed compared to recent prior examinations although clearly enlarged over time. 3.  Coronary artery disease.  Aortic Atherosclerosis (ICD10-I70.0). Electronically Signed   By: Brenda Barber M.D.   On: 07/30/2019 09:51     Past medical hx Past Medical History:  Diagnosis Date  . Allergy   . Anemia   . Asthma   . Atrial fibrillation ([place])   . Cerebrovascular accident [facility])    History of visual deficit  . Chiari I malformation ([place])   . Coronary artery disease   . Depression   . Dyspnea   . Dysrhythmia    atrial fib  . GERD (gastroesophageal reflux disease)   . Hyperlipidemia   . Hypertension   . Hypoparathyroidism ([place])   . Hypothyroidism   . Personality disorder, depressive   . PONV (postoperative nausea and vomiting)    difficulty breathing during endoscopy, colonoscopy performed prior w/out problems  . Renal insufficiency   . Sleep apnea    CPAP  . Thyroid cancer ([place]) 1999   Papillary  . Thyroid disease    hypothyroid      Social History   Tobacco Use  . Smoking status: Never Smoker  . Smokeless tobacco: Never Used  Substance Use Topics  . Alcohol use: No  . Drug use: No    [name] reports that she has never smoked. She has never used smokeless tobacco. She reports that she does not drink alcohol or use drugs.  Tobacco Cessation: Never smoker   Past surgical hx, Family hx, Social hx all reviewed.  Current Outpatient Medications on File Prior to Visit  Medication Sig  . acetaminophen (TYLENOL) 500 MG tablet Take 500 mg by mouth every 6 (six) hours as needed for mild pain.  Marland Kitchen albuterol (PROVENTIL HFA;VENTOLIN HFA) 108 (90 BASE) MCG/ACT inhaler Inhale 2 puffs into the lungs every 6 (six) hours as needed for wheezing or shortness of breath.   Marland Kitchen apixaban (ELIQUIS) 5 MG TABS tablet Take 5 mg by mouth 2 (two) times daily.   Marland Kitchen atorvastatin (LIPITOR)  80 MG tablet Take 80 mg by mouth at bedtime.   . Azelastine HCl 0.15 % SOLN Place 1 spray into both nostrils 2 (two) times daily. Use in each nostril as directed   . benzonatate (TESSALON) 100 MG capsule Take 100 mg by mouth 3 (three) times daily as needed for cough.   . budesonide-formoterol (SYMBICORT) 160-4.5 MCG/ACT inhaler Inhale 2 puffs into the lungs 2 (two) times daily.  . calcitRIOL (ROCALTROL) 0.25 MCG capsule Take 0.25-0.5 mcg by mouth See admin instructions. Take 2 capsules every morning and 1 capsule every evening.   . calcium carbonate (TUMS E-X 750) 750 MG chewable tablet Chew 1 tablet by mouth at bedtime.   . iron polysaccharides (NIFEREX) 150 MG capsule Take 150 mg by mouth daily.   Marland Kitchen levothyroxine (SYNTHROID, LEVOTHROID) 137 MCG tablet Take 137 mcg by mouth daily before breakfast.   . losartan (COZAAR) 50 MG tablet Take 50 mg by mouth daily.   . montelukast (SINGULAIR) 10 MG tablet Take 10 mg by mouth at bedtime.  . Multiple Vitamin (MULTIVITAMIN) tablet Take 1 tablet by mouth daily.  . niacin 500 MG tablet Take 500 mg by mouth daily.  . nitroGLYCERIN (NITROSTAT) 0.4 MG SL tablet Place 0.4 mg under the tongue every 5 (  five) minutes as needed for chest pain.   . potassium chloride SA (K-DUR,KLOR-CON) 20 MEQ tablet Take 20 mEq by mouth daily.   Marland Kitchen Propylene Glycol (SYSTANE BALANCE) 0.6 % SOLN Place 1 drop into both eyes at bedtime.  . sotalol (BETAPACE) 80 MG tablet Take 1 tablet by mouth daily.  Marland Kitchen Spacer/Aero-Holding Chambers (AEROCHAMBER MV) inhaler Use as instructed  . sucralfate (CARAFATE) 1 g tablet Take 1 g by mouth 2 (two) times daily.  . vitamin C (ASCORBIC ACID) 500 MG tablet Take 500 mg by mouth daily.  . budesonide-formoterol (SYMBICORT) 160-4.5 MCG/ACT inhaler Inhale 2 puffs into the lungs 2 (two) times daily for 1 day.   No current facility-administered medications on file prior to visit.     Allergies  Allergen Reactions  . Aspirin-Dipyridamole Er Other (See  Comments)    REACTION: Headache AGGRENOX   . Rosuvastatin Other (See Comments)    REACTION: Not effective    Review Of Systems:  Constitutional:   No  weight loss, night sweats,  Fevers, chills, fatigue, or  lassitude.  HEENT:   No headaches,  Difficulty swallowing,  Tooth/dental problems, or  Sore throat,                No sneezing, itching, ear ache, nasal congestion, post nasal drip,   CV:  No chest pain,  Orthopnea, PND, swelling in lower extremities, anasarca, dizziness, palpitations, syncope.   GI  No heartburn, indigestion, abdominal pain, nausea, vomiting, diarrhea, change in bowel habits, loss of appetite, bloody stools.   Resp: No shortness of breath with exertion or at rest.  No excess mucus, no productive cough,  No non-productive cough,  No coughing up of blood.  No change in color of mucus.  No wheezing.  No chest wall deformity  Skin: no rash or lesions.  GU: no dysuria, change in color of urine, no urgency or frequency.  No flank pain, no hematuria   MS:  No joint pain or swelling.  No decreased range of motion.  No back pain.  Psych:  No change in mood or affect. No depression or anxiety.  No memory loss.   Vital Signs BP 140/80 (BP Location: Left Arm, Cuff Size: Normal)   Pulse 74   Temp (!) 97 F (36.1 C) (Temporal)   Ht 5\' 4"  (1.626 m)   Wt 151 lb (68.5 kg)   SpO2 97% Comment: on RA  BMI 25.92 kg/m    Physical Exam:  General- No distress,  A&Ox3, pleasant ENT: No sinus tenderness, TM clear, pale nasal mucosa, no oral exudate,no post nasal drip, no LAN Cardiac: S1, S2, regular rate and rhythm, no murmur Chest: No wheeze/ rales/ dullness; no accessory muscle use, no nasal flaring, no sternal retractions Abd.: Soft Non-tender, ND, BS + Ext: No clubbing cyanosis, edema Neuro:  normal strength, MAE x 4, A&O x 3 Skin: No rashes,No lesions,  warm and dry Psych: normal mood and behavior, soft spoken   Assessment/Plan Multiple lung nodules with solid  and/or semisolid (groundglass) component History of Thyroid Cancer  10/24/2018>>Navigational Bronchoscopy>> Non-Diagnostic   Dr. Duwayne Barber is following strictly for the issue of potential need for further navigational bronchoscopy.  Plan 6 month follow up Ct Chest without contrast Follow up with Dr. Algis Greenhouse after scan has been completed. Call the office with any change in condition or hemoptysis  Asthma Managed by Dr. Raul Del No recent flares Plan Continue Symbicort 160/4.5, 2 puffs twice daily Use spacer, Rinse mouth after use  Follow up with Dr. Raul Del  Healthcare Maintenance Please schedule a Covid Vaccine  [facility]  Managed by another provider  This appointment was 30 min [place] with over 50% of the time in direct face-to-face patient care, assessment, plan of care, and follow-up.    Magdalen Spatz, NP 08/05/2019  12:12 PM

## 2019-08-05 NOTE — Patient Instructions (Addendum)
It is good to see you today. Your CT. Scan shows stable nodulesm We will schedul a 6 month follow up CT as surveillance per Dr. Duwayne Heck. Continue Symbicort 160/4.5, 2 puffs twice a day.   Use your spacer.  Use your rescue inhaler as needed for breakthrough shortness of breath or wheezing.   Rinse your mouth well after use put a little baking soda and rinsing water.  Continue your Singulair daily  We will see you in 6 months to repeat your CT scan of the chest without contrast. We will schedule you for a 6 month follow up appointment with Dr. Duwayne Heck ( Please schedule after CT scan is completed so Dr. Duwayne Heck can review at appointment)  Get your Covid vaccine  We want you to be safe. Please contact office for sooner follow up if symptoms do not improve or worsen or seek emergency care

## 2019-08-06 DIAGNOSIS — M25552 Pain in left hip: Secondary | ICD-10-CM | POA: Diagnosis not present

## 2019-08-13 DIAGNOSIS — G4733 Obstructive sleep apnea (adult) (pediatric): Secondary | ICD-10-CM | POA: Diagnosis not present

## 2019-08-25 DIAGNOSIS — R918 Other nonspecific abnormal finding of lung field: Secondary | ICD-10-CM | POA: Diagnosis not present

## 2019-08-25 DIAGNOSIS — G4733 Obstructive sleep apnea (adult) (pediatric): Secondary | ICD-10-CM | POA: Diagnosis not present

## 2019-08-25 DIAGNOSIS — J4520 Mild intermittent asthma, uncomplicated: Secondary | ICD-10-CM | POA: Diagnosis not present

## 2019-08-27 DIAGNOSIS — M25552 Pain in left hip: Secondary | ICD-10-CM | POA: Diagnosis not present

## 2019-09-01 DIAGNOSIS — G4733 Obstructive sleep apnea (adult) (pediatric): Secondary | ICD-10-CM | POA: Diagnosis not present

## 2019-09-03 DIAGNOSIS — M25552 Pain in left hip: Secondary | ICD-10-CM | POA: Diagnosis not present

## 2019-09-13 DIAGNOSIS — G4733 Obstructive sleep apnea (adult) (pediatric): Secondary | ICD-10-CM | POA: Diagnosis not present

## 2019-09-17 DIAGNOSIS — M25552 Pain in left hip: Secondary | ICD-10-CM | POA: Diagnosis not present

## 2019-10-05 DIAGNOSIS — E890 Postprocedural hypothyroidism: Secondary | ICD-10-CM | POA: Diagnosis not present

## 2019-10-05 DIAGNOSIS — E892 Postprocedural hypoparathyroidism: Secondary | ICD-10-CM | POA: Diagnosis not present

## 2019-10-05 DIAGNOSIS — Z85850 Personal history of malignant neoplasm of thyroid: Secondary | ICD-10-CM | POA: Diagnosis not present

## 2019-10-06 ENCOUNTER — Other Ambulatory Visit: Payer: Self-pay

## 2019-10-06 ENCOUNTER — Ambulatory Visit: Payer: Medicare HMO

## 2019-10-06 ENCOUNTER — Inpatient Hospital Stay (HOSPITAL_BASED_OUTPATIENT_CLINIC_OR_DEPARTMENT_OTHER): Payer: Medicare HMO | Admitting: Internal Medicine

## 2019-10-06 ENCOUNTER — Encounter: Payer: Self-pay | Admitting: Internal Medicine

## 2019-10-06 ENCOUNTER — Inpatient Hospital Stay: Payer: Medicare HMO | Attending: Internal Medicine

## 2019-10-06 DIAGNOSIS — J449 Chronic obstructive pulmonary disease, unspecified: Secondary | ICD-10-CM | POA: Insufficient documentation

## 2019-10-06 DIAGNOSIS — C73 Malignant neoplasm of thyroid gland: Secondary | ICD-10-CM

## 2019-10-06 DIAGNOSIS — K219 Gastro-esophageal reflux disease without esophagitis: Secondary | ICD-10-CM | POA: Insufficient documentation

## 2019-10-06 DIAGNOSIS — N183 Chronic kidney disease, stage 3 unspecified: Secondary | ICD-10-CM | POA: Insufficient documentation

## 2019-10-06 DIAGNOSIS — Z79899 Other long term (current) drug therapy: Secondary | ICD-10-CM | POA: Diagnosis not present

## 2019-10-06 DIAGNOSIS — I4891 Unspecified atrial fibrillation: Secondary | ICD-10-CM | POA: Insufficient documentation

## 2019-10-06 DIAGNOSIS — E039 Hypothyroidism, unspecified: Secondary | ICD-10-CM | POA: Diagnosis not present

## 2019-10-06 DIAGNOSIS — I129 Hypertensive chronic kidney disease with stage 1 through stage 4 chronic kidney disease, or unspecified chronic kidney disease: Secondary | ICD-10-CM | POA: Insufficient documentation

## 2019-10-06 DIAGNOSIS — Z9989 Dependence on other enabling machines and devices: Secondary | ICD-10-CM | POA: Diagnosis not present

## 2019-10-06 DIAGNOSIS — E785 Hyperlipidemia, unspecified: Secondary | ICD-10-CM | POA: Diagnosis not present

## 2019-10-06 DIAGNOSIS — Z7951 Long term (current) use of inhaled steroids: Secondary | ICD-10-CM | POA: Insufficient documentation

## 2019-10-06 DIAGNOSIS — Z7901 Long term (current) use of anticoagulants: Secondary | ICD-10-CM | POA: Insufficient documentation

## 2019-10-06 DIAGNOSIS — I2510 Atherosclerotic heart disease of native coronary artery without angina pectoris: Secondary | ICD-10-CM | POA: Insufficient documentation

## 2019-10-06 DIAGNOSIS — G4730 Sleep apnea, unspecified: Secondary | ICD-10-CM | POA: Diagnosis not present

## 2019-10-06 DIAGNOSIS — Z8673 Personal history of transient ischemic attack (TIA), and cerebral infarction without residual deficits: Secondary | ICD-10-CM | POA: Insufficient documentation

## 2019-10-06 DIAGNOSIS — D631 Anemia in chronic kidney disease: Secondary | ICD-10-CM | POA: Diagnosis not present

## 2019-10-06 DIAGNOSIS — R918 Other nonspecific abnormal finding of lung field: Secondary | ICD-10-CM

## 2019-10-06 DIAGNOSIS — Z85850 Personal history of malignant neoplasm of thyroid: Secondary | ICD-10-CM | POA: Insufficient documentation

## 2019-10-06 LAB — CBC WITH DIFFERENTIAL/PLATELET
Abs Immature Granulocytes: 0.03 10*3/uL (ref 0.00–0.07)
Basophils Absolute: 0 10*3/uL (ref 0.0–0.1)
Basophils Relative: 1 %
Eosinophils Absolute: 0.1 10*3/uL (ref 0.0–0.5)
Eosinophils Relative: 2 %
HCT: 35.9 % — ABNORMAL LOW (ref 36.0–46.0)
Hemoglobin: 11.6 g/dL — ABNORMAL LOW (ref 12.0–15.0)
Immature Granulocytes: 0 %
Lymphocytes Relative: 24 %
Lymphs Abs: 1.6 10*3/uL (ref 0.7–4.0)
MCH: 29.1 pg (ref 26.0–34.0)
MCHC: 32.3 g/dL (ref 30.0–36.0)
MCV: 90.2 fL (ref 80.0–100.0)
Monocytes Absolute: 0.5 10*3/uL (ref 0.1–1.0)
Monocytes Relative: 8 %
Neutro Abs: 4.5 10*3/uL (ref 1.7–7.7)
Neutrophils Relative %: 65 %
Platelets: 221 10*3/uL (ref 150–400)
RBC: 3.98 MIL/uL (ref 3.87–5.11)
RDW: 14.8 % (ref 11.5–15.5)
WBC: 6.9 10*3/uL (ref 4.0–10.5)
nRBC: 0 % (ref 0.0–0.2)

## 2019-10-06 LAB — COMPREHENSIVE METABOLIC PANEL
ALT: 24 U/L (ref 0–44)
AST: 22 U/L (ref 15–41)
Albumin: 3.9 g/dL (ref 3.5–5.0)
Alkaline Phosphatase: 97 U/L (ref 38–126)
Anion gap: 9 (ref 5–15)
BUN: 25 mg/dL — ABNORMAL HIGH (ref 8–23)
CO2: 27 mmol/L (ref 22–32)
Calcium: 9.1 mg/dL (ref 8.9–10.3)
Chloride: 107 mmol/L (ref 98–111)
Creatinine, Ser: 1.29 mg/dL — ABNORMAL HIGH (ref 0.44–1.00)
GFR calc Af Amer: 51 mL/min — ABNORMAL LOW (ref >60)
GFR calc non Af Amer: 44 mL/min — ABNORMAL LOW (ref >60)
Glucose, Bld: 120 mg/dL — ABNORMAL HIGH (ref 70–99)
Potassium: 3.2 mmol/L — ABNORMAL LOW (ref 3.5–5.1)
Sodium: 143 mmol/L (ref 135–145)
Total Bilirubin: 0.7 mg/dL (ref 0.3–1.2)
Total Protein: 6.8 g/dL (ref 6.5–8.1)

## 2019-10-06 LAB — IRON AND TIBC
Iron: 47 ug/dL (ref 28–170)
Saturation Ratios: 16 % (ref 10.4–31.8)
TIBC: 287 ug/dL (ref 250–450)
UIBC: 240 ug/dL

## 2019-10-06 LAB — FERRITIN: Ferritin: 50 ng/mL (ref 11–307)

## 2019-10-06 NOTE — Progress Notes (Signed)
Pt in for follow up, denies any concerns today. 

## 2019-10-06 NOTE — Assessment & Plan Note (Addendum)
#   Multiple lung nodules/groudn glass opacities [at least since 2015]; March 2021 CT scan shows slight progression of the bilateral lung groundglass opacities by few millimeters [compared to scan November 2019]; however left lower lobe lung nodule approximately ~ 71mm-concerning for malignancy [previous biopsy inconclusive]-Overall STABLE.  # Anemia/CKD-stage III; hemoglobin around 11.4; STABLE; awaiting iron studies.   # Thyroid cancer-s/p RAIU [2000; Dr.Solum]- stable/ no recurrence.Tumor makers- stable; TSH- 0.02 at goal.   # DISPOSITION: Discussed with patient recommend follow-up with pulmonary.  # follow up as needed- Dr.B  # I reviewed the blood work- with the patient in detail; also reviewed the imaging independently [as summarized above]; and with the patient in detail.    Cc; Dr.Gonzalez/Hayley

## 2019-10-06 NOTE — Progress Notes (Signed)
[facility] OFFICE PROGRESS NOTE  Patient Care Team: [name], MD as PCP - General (Internal Medicine) Telford Nab, RN as Registered Nurse Erby Pian, MD as Consulting Physician (Pulmonary Disease)  Cancer Staging No matching staging information was found for the patient.   Oncology History Overview Note  # bil Lung nodules/GOO Multiple [id number]; Dr.Oaks].  # LLL nodule 8-81mm/ and bil GGO  [May 219- Off amiodarone; Dr.Khan;cards]. June 2020-biopsy [Dr.Gonzalez]-nondiagnostic.   --------------------------------------------------------------------------------------------------  # Thyroid cancer [2000; [facility]] s/p RAI; post-surgical hypothyroidism/ post-surgical hypoparathyroidism.s/p  total thyroidectomy and was found to have a 6 cm follicular thyroid cancer without any local invasion. This was a [id number] follicular thyroid cancer, stage I. S/p radioiodine (115 mCi) and subsequently in 2005, 2006, and 2008 had serially negative thyrogen-stimulated whole body scans. f/u- Dr.Solum.   # CKD/ stage III; ? COPD/asthma [Dr.Fleming]; Afib [on eliquis; Dr.Khan]         Thyroid cancer ([place])    INTERVAL HISTORY:  Brenda Barber 64 y.o.  female pleasant patient above history of multiple lung nodules currently on surveillance of unclear etiology-is here for follow-up.  Patient was evaluated by pulmonary with regards to lung nodules.  Patient also had a CT scan in March 2021.  Patient continues to have mild fatigue.  Chronic mild shortness of breath and cough without any worse.  No nausea vomiting or headaches.   Review of Systems  Constitutional: Negative for chills, diaphoresis, fever, malaise/fatigue and weight loss.  HENT: Negative for nosebleeds and sore throat.   Eyes: Negative for double vision.  Respiratory: Positive for cough, sputum production and shortness of breath. Negative for hemoptysis and wheezing.   Cardiovascular: Negative for chest pain,  palpitations, orthopnea and leg swelling.  Gastrointestinal: Negative for abdominal pain, blood in stool, constipation, diarrhea, heartburn, melena, nausea and vomiting.  Genitourinary: Negative for dysuria, frequency and urgency.  Musculoskeletal: Negative for back pain and joint pain.  Skin: Negative.  Negative for itching and rash.  Neurological: Negative for dizziness, tingling, focal weakness, weakness and headaches.  Endo/Heme/Allergies: Does not bruise/bleed easily.  Psychiatric/Behavioral: Negative for depression. The patient is not nervous/anxious and does not have insomnia.       PAST MEDICAL HISTORY :  Past Medical History:  Diagnosis Date  . Allergy   . Anemia   . Asthma   . Atrial fibrillation ([place])   . Cerebrovascular accident [facility])    History of visual deficit  . Chiari I malformation ([place])   . Coronary artery disease   . Depression   . Dyspnea   . Dysrhythmia    atrial fib  . GERD (gastroesophageal reflux disease)   . Hyperlipidemia   . Hypertension   . Hypoparathyroidism ([place])   . Hypothyroidism   . Personality disorder, depressive   . PONV (postoperative nausea and vomiting)    difficulty breathing during endoscopy, colonoscopy performed prior w/out problems  . Renal insufficiency   . Sleep apnea    CPAP  . Thyroid cancer ([place]) 1999   Papillary  . Thyroid disease    hypothyroid     PAST SURGICAL HISTORY :   Past Surgical History:  Procedure Laterality Date  . CANNOT TOLERATE PAP / Virginal    . CATARACT EXTRACTION, BILATERAL    . ELECTROMAGNETIC NAVIGATION BROCHOSCOPY Left 10/24/2018   Procedure: ELECTROMAGNETIC NAVIGATION BRONCHOSCOPY LEFT;  Surgeon: Tyler Pita, MD;  Location: [facility] ORS;  Service: Cardiopulmonary;  Laterality: Left;  . LEFT HEART CATH AND CORONARY ANGIOGRAPHY Right  11/18/2017   Procedure: Left Heart Cath with possible coronary intervention;  Surgeon: Dionisio David, MD;  Location: [facility];  Service:  Cardiovascular;  Laterality: Right;  . MRI brain  12/1999  . THYROIDECTOMY    . TONSILLECTOMY    . TOTAL HIP ARTHROPLASTY Right 06/26/2016   Procedure: TOTAL HIP ARTHROPLASTY ANTERIOR APPROACH;  Surgeon: Hessie Knows, MD;  Location: [facility] ORS;  Service: Orthopedics;  Laterality: Right;    FAMILY HISTORY :   Family History  Problem Relation Age of Onset  . Diabetes Father   . Diabetes Paternal Aunt   . Diabetes Paternal Uncle   . Breast cancer Sister 14    SOCIAL HISTORY:   Social History   Tobacco Use  . Smoking status: Never Smoker  . Smokeless tobacco: Never Used  Substance Use Topics  . Alcohol use: No  . Drug use: No    ALLERGIES:  is allergic to aspirin-dipyridamole er and rosuvastatin.  MEDICATIONS:  Current Outpatient Medications  Medication Sig Dispense Refill  . acetaminophen (TYLENOL) 500 MG tablet Take 500 mg by mouth every 6 (six) hours as needed for mild pain.    Marland Kitchen albuterol (PROVENTIL HFA;VENTOLIN HFA) 108 (90 BASE) MCG/ACT inhaler Inhale 2 puffs into the lungs every 6 (six) hours as needed for wheezing or shortness of breath.     Marland Kitchen apixaban (ELIQUIS) 5 MG TABS tablet Take 5 mg by mouth 2 (two) times daily.     Marland Kitchen atorvastatin (LIPITOR) 80 MG tablet Take 80 mg by mouth at bedtime.     . Azelastine HCl 0.15 % SOLN Place 1 spray into both nostrils 2 (two) times daily. Use in each nostril as directed     . benzonatate (TESSALON) 100 MG capsule Take 100 mg by mouth 3 (three) times daily as needed for cough.     . budesonide-formoterol (SYMBICORT) 160-4.5 MCG/ACT inhaler Inhale 2 puffs into the lungs 2 (two) times daily. 10.2 g 11  . calcitRIOL (ROCALTROL) 0.25 MCG capsule Take 0.25-0.5 mcg by mouth See admin instructions. Take 2 capsules every morning and 1 capsule every evening.     . calcium carbonate (TUMS E-X 750) 750 MG chewable tablet Chew 1 tablet by mouth at bedtime.     . donepezil (ARICEPT) 10 MG tablet Take by mouth.    . gabapentin (NEURONTIN) 100 MG  capsule     . icosapent Ethyl (VASCEPA) 1 g capsule     . iron polysaccharides (NIFEREX) 150 MG capsule Take 150 mg by mouth daily.     . isosorbide mononitrate (IMDUR) 30 MG 24 hr tablet     . levothyroxine (SYNTHROID, LEVOTHROID) 137 MCG tablet Take 137 mcg by mouth daily before breakfast.     . losartan (COZAAR) 50 MG tablet Take 50 mg by mouth daily.     . montelukast (SINGULAIR) 10 MG tablet Take 10 mg by mouth at bedtime.    . Multiple Vitamin (MULTIVITAMIN) tablet Take 1 tablet by mouth daily.    . niacin 500 MG tablet Take 500 mg by mouth daily.    . nitroGLYCERIN (NITROSTAT) 0.4 MG SL tablet Place 0.4 mg under the tongue every 5 (five) minutes as needed for chest pain.     . potassium chloride SA (K-DUR,KLOR-CON) 20 MEQ tablet Take 20 mEq by mouth daily.     Marland Kitchen Propylene Glycol (SYSTANE BALANCE) 0.6 % SOLN Place 1 drop into both eyes at bedtime.    . sotalol (BETAPACE) 80 MG tablet  Take 1 tablet by mouth daily.    Marland Kitchen Spacer/Aero-Holding Chambers (AEROCHAMBER MV) inhaler Use as instructed 1 each 0  . sucralfate (CARAFATE) 1 g tablet Take 1 g by mouth 2 (two) times daily.    . vitamin C (ASCORBIC ACID) 500 MG tablet Take 500 mg by mouth daily.     No current facility-administered medications for this visit.    PHYSICAL EXAMINATION: ECOG PERFORMANCE STATUS: 1 - Symptomatic but completely ambulatory  BP 111/68 (BP Location: Right Arm, Patient Position: Sitting)   Pulse 79   Temp 99.1 F (37.3 C) (Tympanic)   Resp 18   Wt 149 lb 9.6 oz (67.9 kg)   SpO2 98%   BMI 25.68 kg/m   Filed Weights   10/06/19 1357  Weight: 149 lb 9.6 oz (67.9 kg)    Physical Exam  Constitutional: She is oriented to person, place, and time and well-developed, well-nourished, and in no distress.  Alone.  HENT:  Head: Normocephalic and atraumatic.  Mouth/Throat: Oropharynx is clear and moist. No oropharyngeal exudate.  Eyes: Pupils are equal, round, and reactive to light.  Cardiovascular: Normal  rate and regular rhythm.  Pulmonary/Chest: No respiratory distress. She has no wheezes.  Decreased air entry bilaterally.  Abdominal: Soft. Bowel sounds are normal. She exhibits no distension and no mass. There is no abdominal tenderness. There is no rebound and no guarding.  Musculoskeletal:        General: No tenderness or edema. Normal range of motion.     Cervical back: Normal range of motion and neck supple.  Neurological: She is alert and oriented to person, place, and time.  Skin: Skin is warm.  Psychiatric: Affect normal.       LABORATORY DATA:  I have reviewed the data as listed    Component Value Date/Time   NA 143 10/06/2019 1328   NA 139 09/03/2013 1947   K 3.2 (L) 10/06/2019 1328   K 3.7 09/03/2013 1947   CL 107 10/06/2019 1328   CL 104 09/03/2013 1947   CO2 27 10/06/2019 1328   CO2 28 09/03/2013 1947   GLUCOSE 120 (H) 10/06/2019 1328   GLUCOSE 123 (H) 09/03/2013 1947   BUN 25 (H) 10/06/2019 1328   BUN 22 (H) 09/03/2013 1947   CREATININE 1.29 (H) 10/06/2019 1328   CREATININE 0.96 09/03/2013 1947   CALCIUM 9.1 10/06/2019 1328   CALCIUM 7.7 (L) 09/03/2013 1947   CALCIUM 8.3 (L) 12/19/2009 0000   PROT 6.8 10/06/2019 1328   PROT 7.4 09/03/2013 1947   ALBUMIN 3.9 10/06/2019 1328   ALBUMIN 3.6 09/03/2013 1947   AST 22 10/06/2019 1328   AST 19 09/03/2013 1947   ALT 24 10/06/2019 1328   ALT 22 09/03/2013 1947   ALKPHOS 97 10/06/2019 1328   ALKPHOS 103 09/03/2013 1947   BILITOT 0.7 10/06/2019 1328   BILITOT 0.5 09/03/2013 1947   GFRNONAA 44 (L) 10/06/2019 1328   GFRNONAA >60 09/03/2013 1947   GFRAA 51 (L) 10/06/2019 1328   GFRAA >60 09/03/2013 1947    No results found for: SPEP, UPEP  Lab Results  Component Value Date   WBC 6.9 10/06/2019   NEUTROABS 4.5 10/06/2019   HGB 11.6 (L) 10/06/2019   HCT 35.9 (L) 10/06/2019   MCV 90.2 10/06/2019   PLT 221 10/06/2019      Chemistry      Component Value Date/Time   NA 143 10/06/2019 1328   NA 139  09/03/2013 1947   K 3.2 (L)  10/06/2019 1328   K 3.7 09/03/2013 1947   CL 107 10/06/2019 1328   CL 104 09/03/2013 1947   CO2 27 10/06/2019 1328   CO2 28 09/03/2013 1947   BUN 25 (H) 10/06/2019 1328   BUN 22 (H) 09/03/2013 1947   CREATININE 1.29 (H) 10/06/2019 1328   CREATININE 0.96 09/03/2013 1947      Component Value Date/Time   CALCIUM 9.1 10/06/2019 1328   CALCIUM 7.7 (L) 09/03/2013 1947   CALCIUM 8.3 (L) 12/19/2009 0000   ALKPHOS 97 10/06/2019 1328   ALKPHOS 103 09/03/2013 1947   AST 22 10/06/2019 1328   AST 19 09/03/2013 1947   ALT 24 10/06/2019 1328   ALT 22 09/03/2013 1947   BILITOT 0.7 10/06/2019 1328   BILITOT 0.5 09/03/2013 1947       RADIOGRAPHIC STUDIES: I have personally reviewed the radiological images as listed and agreed with the findings in the report. No results found.   ASSESSMENT & PLAN:  Multiple lung nodules # Multiple lung nodules/groudn glass opacities [at least since 2015]; March 2021 CT scan shows slight progression of the bilateral lung groundglass opacities by few millimeters [compared to scan November 2019]; however left lower lobe lung nodule approximately ~ 74mm-concerning for malignancy [previous biopsy inconclusive]-Overall STABLE.  # Anemia/CKD-stage III; hemoglobin around 11.4; STABLE; awaiting iron studies.   # Thyroid cancer-s/p RAIU [2000; Dr.Solum]- stable/ no recurrence.Tumor makers- stable; TSH- 0.02 at goal.   # DISPOSITION: Discussed with patient recommend follow-up with pulmonary.  # follow up as needed- Dr.B  # I reviewed the blood work- with the patient in detail; also reviewed the imaging independently [as summarized above]; and with the patient in detail.    Cc; Dr.Gonzalez/[place]   No orders of the defined types were placed in this encounter.  All questions were answered. The patient knows to call the clinic with any problems, questions or concerns.      Cammie Sickle, MD 10/06/2019 2:23 PM

## 2019-10-07 DIAGNOSIS — M25552 Pain in left hip: Secondary | ICD-10-CM | POA: Diagnosis not present

## 2019-10-07 LAB — TGAB+THYROGLOBULIN IMA OR RIA: Thyroglobulin Antibody: <1 IU/mL (ref 0.0–0.9)

## 2019-10-07 LAB — THYROGLOBULIN BY IMA: Thyroglobulin by IMA: <0.1 ng/mL — ABNORMAL LOW (ref 1.5–38.5)

## 2019-10-08 NOTE — Progress Notes (Signed)
Agree with the details of the encounter as noted below.  Patient will be scheduled for follow-up CT.  Renold Don, MD [facility]

## 2019-10-12 DIAGNOSIS — E890 Postprocedural hypothyroidism: Secondary | ICD-10-CM | POA: Diagnosis not present

## 2019-10-12 DIAGNOSIS — E892 Postprocedural hypoparathyroidism: Secondary | ICD-10-CM | POA: Diagnosis not present

## 2019-10-12 DIAGNOSIS — Z85850 Personal history of malignant neoplasm of thyroid: Secondary | ICD-10-CM | POA: Diagnosis not present

## 2019-10-13 DIAGNOSIS — G4733 Obstructive sleep apnea (adult) (pediatric): Secondary | ICD-10-CM | POA: Diagnosis not present

## 2019-10-22 DIAGNOSIS — M25552 Pain in left hip: Secondary | ICD-10-CM | POA: Diagnosis not present

## 2019-10-27 DIAGNOSIS — M25552 Pain in left hip: Secondary | ICD-10-CM | POA: Diagnosis not present

## 2019-10-30 DIAGNOSIS — I2510 Atherosclerotic heart disease of native coronary artery without angina pectoris: Secondary | ICD-10-CM | POA: Diagnosis not present

## 2019-11-03 DIAGNOSIS — I2510 Atherosclerotic heart disease of native coronary artery without angina pectoris: Secondary | ICD-10-CM | POA: Diagnosis not present

## 2019-11-03 DIAGNOSIS — E785 Hyperlipidemia, unspecified: Secondary | ICD-10-CM | POA: Diagnosis not present

## 2019-11-03 DIAGNOSIS — I4891 Unspecified atrial fibrillation: Secondary | ICD-10-CM | POA: Diagnosis not present

## 2019-11-03 DIAGNOSIS — I10 Essential (primary) hypertension: Secondary | ICD-10-CM | POA: Diagnosis not present

## 2019-11-03 DIAGNOSIS — E781 Pure hyperglyceridemia: Secondary | ICD-10-CM | POA: Diagnosis not present

## 2019-11-03 DIAGNOSIS — G4733 Obstructive sleep apnea (adult) (pediatric): Secondary | ICD-10-CM | POA: Diagnosis not present

## 2019-11-10 DIAGNOSIS — M25552 Pain in left hip: Secondary | ICD-10-CM | POA: Diagnosis not present

## 2019-11-11 DIAGNOSIS — G935 Compression of brain: Secondary | ICD-10-CM | POA: Diagnosis not present

## 2019-11-11 DIAGNOSIS — I480 Paroxysmal atrial fibrillation: Secondary | ICD-10-CM | POA: Diagnosis not present

## 2019-11-11 DIAGNOSIS — E209 Hypoparathyroidism, unspecified: Secondary | ICD-10-CM | POA: Diagnosis not present

## 2019-11-11 DIAGNOSIS — D500 Iron deficiency anemia secondary to blood loss (chronic): Secondary | ICD-10-CM | POA: Diagnosis not present

## 2019-11-11 DIAGNOSIS — G4733 Obstructive sleep apnea (adult) (pediatric): Secondary | ICD-10-CM | POA: Diagnosis not present

## 2019-11-11 DIAGNOSIS — Z7901 Long term (current) use of anticoagulants: Secondary | ICD-10-CM | POA: Diagnosis not present

## 2019-11-11 DIAGNOSIS — E890 Postprocedural hypothyroidism: Secondary | ICD-10-CM | POA: Diagnosis not present

## 2019-11-11 DIAGNOSIS — J449 Chronic obstructive pulmonary disease, unspecified: Secondary | ICD-10-CM | POA: Diagnosis not present

## 2019-11-11 DIAGNOSIS — M25552 Pain in left hip: Secondary | ICD-10-CM | POA: Diagnosis not present

## 2019-11-13 DIAGNOSIS — G4733 Obstructive sleep apnea (adult) (pediatric): Secondary | ICD-10-CM | POA: Diagnosis not present

## 2019-11-19 DIAGNOSIS — M25552 Pain in left hip: Secondary | ICD-10-CM | POA: Diagnosis not present

## 2019-11-25 DIAGNOSIS — I10 Essential (primary) hypertension: Secondary | ICD-10-CM | POA: Diagnosis not present

## 2019-11-25 DIAGNOSIS — E781 Pure hyperglyceridemia: Secondary | ICD-10-CM | POA: Diagnosis not present

## 2019-11-25 DIAGNOSIS — E785 Hyperlipidemia, unspecified: Secondary | ICD-10-CM | POA: Diagnosis not present

## 2019-11-27 ENCOUNTER — Ambulatory Visit
Admission: RE | Admit: 2019-11-27 | Discharge: 2019-11-27 | Disposition: A | Discharge status: Home / Self Care | Payer: Medicare HMO | Source: Ambulatory Visit | Attending: Internal Medicine | Admitting: Internal Medicine

## 2019-11-27 DIAGNOSIS — Z1231 Encounter for screening mammogram for malignant neoplasm of breast: Secondary | ICD-10-CM

## 2019-12-02 DIAGNOSIS — M25552 Pain in left hip: Secondary | ICD-10-CM | POA: Diagnosis not present

## 2019-12-04 DIAGNOSIS — G4733 Obstructive sleep apnea (adult) (pediatric): Secondary | ICD-10-CM | POA: Diagnosis not present

## 2019-12-13 DIAGNOSIS — G4733 Obstructive sleep apnea (adult) (pediatric): Secondary | ICD-10-CM | POA: Diagnosis not present

## 2019-12-21 DIAGNOSIS — G4733 Obstructive sleep apnea (adult) (pediatric): Secondary | ICD-10-CM | POA: Diagnosis not present

## 2019-12-21 DIAGNOSIS — R0602 Shortness of breath: Secondary | ICD-10-CM | POA: Diagnosis not present

## 2019-12-21 DIAGNOSIS — J449 Chronic obstructive pulmonary disease, unspecified: Secondary | ICD-10-CM | POA: Diagnosis not present

## 2020-01-04 DIAGNOSIS — J45998 Other asthma: Secondary | ICD-10-CM | POA: Diagnosis not present

## 2020-01-04 DIAGNOSIS — I4891 Unspecified atrial fibrillation: Secondary | ICD-10-CM | POA: Diagnosis not present

## 2020-01-04 DIAGNOSIS — I2510 Atherosclerotic heart disease of native coronary artery without angina pectoris: Secondary | ICD-10-CM | POA: Diagnosis not present

## 2020-01-04 DIAGNOSIS — I10 Essential (primary) hypertension: Secondary | ICD-10-CM | POA: Diagnosis not present

## 2020-01-04 DIAGNOSIS — E781 Pure hyperglyceridemia: Secondary | ICD-10-CM | POA: Diagnosis not present

## 2020-01-04 DIAGNOSIS — E785 Hyperlipidemia, unspecified: Secondary | ICD-10-CM | POA: Diagnosis not present

## 2020-01-13 DIAGNOSIS — G4733 Obstructive sleep apnea (adult) (pediatric): Secondary | ICD-10-CM | POA: Diagnosis not present

## 2020-01-19 DIAGNOSIS — R413 Other amnesia: Secondary | ICD-10-CM | POA: Diagnosis not present

## 2020-01-19 DIAGNOSIS — R519 Headache, unspecified: Secondary | ICD-10-CM | POA: Diagnosis not present

## 2020-02-05 ENCOUNTER — Ambulatory Visit
Admission: RE | Admit: 2020-02-05 | Discharge: 2020-02-05 | Disposition: A | Discharge status: Home / Self Care | Payer: Medicare HMO | Source: Ambulatory Visit | Attending: Acute Care | Admitting: Acute Care

## 2020-02-05 ENCOUNTER — Other Ambulatory Visit: Payer: Self-pay

## 2020-02-05 DIAGNOSIS — I2510 Atherosclerotic heart disease of native coronary artery without angina pectoris: Secondary | ICD-10-CM | POA: Diagnosis not present

## 2020-02-05 DIAGNOSIS — Z85850 Personal history of malignant neoplasm of thyroid: Secondary | ICD-10-CM | POA: Diagnosis not present

## 2020-02-05 DIAGNOSIS — E890 Postprocedural hypothyroidism: Secondary | ICD-10-CM | POA: Diagnosis not present

## 2020-02-05 DIAGNOSIS — R918 Other nonspecific abnormal finding of lung field: Secondary | ICD-10-CM | POA: Diagnosis not present

## 2020-02-05 DIAGNOSIS — I700 Atherosclerosis of aorta: Secondary | ICD-10-CM | POA: Diagnosis not present

## 2020-02-13 DIAGNOSIS — G4733 Obstructive sleep apnea (adult) (pediatric): Secondary | ICD-10-CM | POA: Diagnosis not present

## 2020-02-16 ENCOUNTER — Other Ambulatory Visit: Payer: Self-pay | Admitting: Acute Care

## 2020-02-16 DIAGNOSIS — R918 Other nonspecific abnormal finding of lung field: Secondary | ICD-10-CM

## 2020-02-16 NOTE — Progress Notes (Signed)
PET scan ordered as follow up.

## 2020-02-18 ENCOUNTER — Telehealth: Payer: Self-pay | Admitting: Pulmonary Disease

## 2020-02-18 NOTE — Telephone Encounter (Signed)
Patient is scheduled for OV with Dr. Patsey Berthold tomorrow 02/19/20. It also appears that she sees both Dr. Raul Del and Dr. Patsey Berthold. Patient stated that she sees Dr. Raul Del for her COPD and Dr. Patsey Berthold for nodule, as Dr. Raul Del can not manage nodules.   Dr. Patsey Berthold, please advise. Thanks

## 2020-02-19 ENCOUNTER — Encounter: Payer: Self-pay | Admitting: Pulmonary Disease

## 2020-02-19 ENCOUNTER — Other Ambulatory Visit: Payer: Self-pay

## 2020-02-19 ENCOUNTER — Ambulatory Visit (INDEPENDENT_AMBULATORY_CARE_PROVIDER_SITE_OTHER): Payer: Medicare HMO | Admitting: Pulmonary Disease

## 2020-02-19 VITALS — BP 122/86 | HR 83 | Temp 97.5°F | Ht 63.0 in | Wt 155.0 lb

## 2020-02-19 DIAGNOSIS — R918 Other nonspecific abnormal finding of lung field: Secondary | ICD-10-CM

## 2020-02-19 DIAGNOSIS — Z85850 Personal history of malignant neoplasm of thyroid: Secondary | ICD-10-CM | POA: Diagnosis not present

## 2020-02-19 DIAGNOSIS — J45909 Unspecified asthma, uncomplicated: Secondary | ICD-10-CM

## 2020-02-19 DIAGNOSIS — G4733 Obstructive sleep apnea (adult) (pediatric): Secondary | ICD-10-CM | POA: Diagnosis not present

## 2020-02-19 MED ORDER — TRELEGY ELLIPTA 200-62.5-25 MCG/INH IN AEPB
1.0000 | INHALATION_SPRAY | Freq: Every day | RESPIRATORY_TRACT | 0 refills | Status: AC
Start: 2020-02-19 — End: 2020-02-20

## 2020-02-19 NOTE — Patient Instructions (Addendum)
We are giving you a trial of a new inhaler called Trelegy, do not use Symbicort while using the Trelegy.   You will have your PET/CT performed on Thursday, 7 October, keep that appointment.   We are checking your oxygen levels on your CPAP.   We are going to see you in follow-up on 12 October which is a Tuesday.

## 2020-02-19 NOTE — Progress Notes (Signed)
Subjective:    Patient ID: Brenda Barber, female    DOB: April 07, 1956, 64 y.o.   MRN: [id number]  Brenda Barber is a 64 year old woman, lifelong never smoker, with a history of asthma and thyroid cancer who presents for follow-up on multiple pulmonary nodules some which are groundglass some which are solid.  She was last evaluated here in March 2021.  She has had prior navigational bronchoscopy in June 2020.  He has been on surveillance after bronchoscopy was nondiagnostic.  Most recent CT scan was performed on February 05, 2020.  This showed continued slow growth of the left lower lobe nodule which had previously been biopsied.  Numerous groundglass nodules in both lungs continue to be present though unchanged.  As noted the patient has a history with stable tumor markers and no evidence of recurrence.  Patient has a history of asthma and obstructive sleep apnea that is usually followed by Dr. Raul Del.  She follows here strictly for the nodules issue.  However on presentation today she states that she has been having orthopnea over the last several months.  She also has needed to use her CPAP more frequently.  She stated that she is only supposed to use her CPAP 4 hours a night but she is using it at least 8 hours a night.  I advised her that this is the proper way of using it 4 hours a night is a minimum of use not this suggested timeframe.  She does not have oxygen bled into her CPAP.  In any event she seems more breathless and uncomfortable.  She has been using Symbicort but feels that this is not controlling her symptoms as well as it did in the beginning.  We are asked to consider repeating navigational bronchoscopy and in this regard she will need to be more compensated to be able to do this.  She has not had any fevers, chills or sweats.  Cough has been productive of clear sputum.  She has had her SARS-CoV-2 vaccine Therapist, music).  She states that her cardiologist Dr. Manuella Ghazi, recently evaluated her with no new  findings noted.  We do not have those records.  Review of Systems A 10 point review of systems was performed and it is as noted above otherwise negative.  Allergies  Allergen Reactions  . Aspirin-Dipyridamole Er Other (See Comments)    REACTION: Headache AGGRENOX   . Rosuvastatin Other (See Comments)    REACTION: Not effective   Current Meds  Medication Sig  . acetaminophen (TYLENOL) 500 MG tablet Take 500 mg by mouth every 6 (six) hours as needed for mild pain.  Marland Kitchen albuterol (PROVENTIL HFA;VENTOLIN HFA) 108 (90 BASE) MCG/ACT inhaler Inhale 2 puffs into the lungs every 6 (six) hours as needed for wheezing or shortness of breath.   Marland Kitchen apixaban (ELIQUIS) 5 MG TABS tablet Take 5 mg by mouth 2 (two) times daily.   Marland Kitchen atorvastatin (LIPITOR) 80 MG tablet Take 80 mg by mouth at bedtime.   . Azelastine HCl 0.15 % SOLN Place 1 spray into both nostrils 2 (two) times daily. Use in each nostril as directed   . benzonatate (TESSALON) 100 MG capsule Take 100 mg by mouth 3 (three) times daily as needed for cough.   . budesonide-formoterol (SYMBICORT) 160-4.5 MCG/ACT inhaler Inhale 2 puffs into the lungs 2 (two) times daily.  . calcitRIOL (ROCALTROL) 0.25 MCG capsule Take 0.25-0.5 mcg by mouth See admin instructions. Take 2 capsules every morning and 1 capsule every evening.   Marland Kitchen  calcium carbonate (TUMS E-X 750) 750 MG chewable tablet Chew 1 tablet by mouth at bedtime.   . gabapentin (NEURONTIN) 100 MG capsule   . icosapent Ethyl (VASCEPA) 1 g capsule   . iron polysaccharides (NIFEREX) 150 MG capsule Take 150 mg by mouth daily.   . isosorbide mononitrate (IMDUR) 30 MG 24 hr tablet   . levothyroxine (SYNTHROID, LEVOTHROID) 137 MCG tablet Take 137 mcg by mouth daily before breakfast.   . losartan (COZAAR) 50 MG tablet Take 50 mg by mouth daily.   . montelukast (SINGULAIR) 10 MG tablet Take 10 mg by mouth at bedtime.  . Multiple Vitamin (MULTIVITAMIN) tablet Take 1 tablet by mouth daily.  . niacin 500 MG  tablet Take 500 mg by mouth daily.  . nitroGLYCERIN (NITROSTAT) 0.4 MG SL tablet Place 0.4 mg under the tongue every 5 (five) minutes as needed for chest pain.   . potassium chloride SA (K-DUR,KLOR-CON) 20 MEQ tablet Take 20 mEq by mouth daily.   Marland Kitchen Propylene Glycol (SYSTANE BALANCE) 0.6 % SOLN Place 1 drop into both eyes at bedtime.  . sotalol (BETAPACE) 80 MG tablet Take 1 tablet by mouth daily.  Marland Kitchen Spacer/Aero-Holding Chambers (AEROCHAMBER MV) inhaler Use as instructed  . sucralfate (CARAFATE) 1 g tablet Take 1 g by mouth 2 (two) times daily.  . vitamin C (ASCORBIC ACID) 500 MG tablet Take 500 mg by mouth daily.   Immunization History  Administered Date(s) Administered  . Influenza Inj Mdck Quad Pf 02/24/2018  . Influenza-Unspecified 02/23/2015, 02/03/2016, 02/25/2017, 03/09/2019  . PFIZER SARS-COV-2 Vaccination 08/18/2019, 09/08/2019  . Pneumococcal Conjugate-13 04/22/2019  . Pneumococcal Polysaccharide-23 01/16/2016  . Td 05/21/1997, 12/23/2009       Objective:   Physical Exam BP 122/86 (BP Location: Left Arm, Patient Position: Sitting, Cuff Size: Normal)   Pulse 83   Temp (!) 97.5 F (36.4 C) (Temporal)   Ht 5\' 3"  (1.6 m)   Wt 155 lb (70.3 kg)   SpO2 97%   BMI 27.46 kg/m  GENERAL: Overweight woman, no acute distress.  She is hard of hearing wears hearing aids.  She presents in transport chair with his unusual for her. HEAD: Normocephalic, atraumatic.  EYES: Pupils equal, round, reactive to light.  No scleral icterus.  MOUTH: Nose/mouth/throat not examined due to masking requirements for COVID 19. NECK: Supple. No thyromegaly. Trachea midline. No JVD.  No adenopathy. PULMONARY: Good air entry bilaterally.  Faint end expiratory wheezes in the upper lung zones, no other adventitious sounds. CARDIOVASCULAR: S1 and S2. Regular rate and rhythm.  No rubs, murmurs or gallops heard. ABDOMEN: Benign. MUSCULOSKELETAL: No joint deformity, no clubbing, no edema.  NEUROLOGIC: No overt  focal deficit.  Mild speech impediment due to [facility].  Wears bilateral hearing aids. SKIN: Intact,warm,dry. PSYCH: Mood and behavior are normal.     Assessment & Plan:     ICD-10-CM   1. Multiple lung nodules  R91.8    She is to have PET/CT on 7 October Reassess on October 12 for navigational bronchoscopy Given worsening asthma query carcinoid   2. Uncomplicated asthma, unspecified asthma severity, unspecified whether persistent  J45.909 Pulse oximetry, overnight   Does not appear to be well compensated Discontinue Symbicort Follow-up October 12 Trelegy Ellipta 200/62.5/25 1 puff daily  3. History of thyroid cancer  Z85.850    This issue adds complexity to her management  4. Sleep apnea, obstructive  G47.33    Compliance with CPAP is good per patient New issues with orthopnea Check  overnight oximetry on CPAP   Orders Placed This Encounter  Procedures  . Pulse oximetry, overnight    ON ROOMAIR.   [id number]    Standing Status:   Future    Standing Expiration Date:   02/18/2021   Meds ordered this encounter  Medications  . Fluticasone-Umeclidin-Vilant (TRELEGY ELLIPTA) 200-62.5-25 MCG/INH AEPB    Sig: Inhale 1 puff into the lungs daily for 1 day.    Dispense:  14 each    Refill:  0    Order Specific Question:   Lot Number?    Answer:   [id number]    Order Specific Question:   Expiration Date?    Answer:   02/18/2021    Order Specific Question:   Manufacturer?    Answer:   [employer] [[id number]]   Discussion:  The patient is followed here mostly for her multiple lung nodules.  She follows with Dr. Wallene Huh for asthma and obstructive sleep apnea.  We are seeing her for sideration of repeat navigational bronchoscopy.  She will have PET/CT performed on 7 October.  We would like to have those images to evaluate which nodules to sample.  In addition, the patient is not well compensated on her exam today.  We have made changes as noted above to expedite this so that she can be  scheduled soon as possible for procedure.  See orders as above.  We will see the patient in follow-up on 12 October at 4:15 PM.  She is to contact us prior to that time should any new difficulties arise.  Renold Don, MD [facility]   *This note was dictated using voice recognition software/Dragon.  Despite best efforts to proofread, errors can occur which can change the meaning.  Any change was purely unintentional.

## 2020-02-19 NOTE — Telephone Encounter (Signed)
We will see her for her enlarging nodule.

## 2020-02-19 NOTE — Telephone Encounter (Signed)
Noted. Patient had visit on 02/19/2020 with Dr. Patsey Berthold.

## 2020-02-25 ENCOUNTER — Other Ambulatory Visit: Payer: Self-pay

## 2020-02-25 ENCOUNTER — Encounter
Admission: RE | Admit: 2020-02-25 | Discharge: 2020-02-25 | Disposition: A | Discharge status: Home / Self Care | Payer: Medicare HMO | Source: Ambulatory Visit | Attending: Acute Care | Admitting: Acute Care

## 2020-02-25 DIAGNOSIS — I700 Atherosclerosis of aorta: Secondary | ICD-10-CM | POA: Insufficient documentation

## 2020-02-25 DIAGNOSIS — K5730 Diverticulosis of large intestine without perforation or abscess without bleeding: Secondary | ICD-10-CM | POA: Diagnosis not present

## 2020-02-25 DIAGNOSIS — R918 Other nonspecific abnormal finding of lung field: Secondary | ICD-10-CM | POA: Diagnosis not present

## 2020-02-25 DIAGNOSIS — N281 Cyst of kidney, acquired: Secondary | ICD-10-CM | POA: Diagnosis not present

## 2020-02-25 DIAGNOSIS — I2510 Atherosclerotic heart disease of native coronary artery without angina pectoris: Secondary | ICD-10-CM | POA: Diagnosis not present

## 2020-02-25 LAB — GLUCOSE, CAPILLARY: Glucose-Capillary: 83 mg/dL (ref 70–99)

## 2020-02-25 MED ORDER — FLUDEOXYGLUCOSE F - 18 (FDG) INJECTION
8.0000 | Freq: Once | INTRAVENOUS | Status: AC | PRN
  Administered 2020-02-25: 8.87 via INTRAVENOUS

## 2020-03-01 ENCOUNTER — Encounter: Payer: Self-pay | Admitting: Pulmonary Disease

## 2020-03-01 ENCOUNTER — Other Ambulatory Visit: Payer: Self-pay

## 2020-03-01 ENCOUNTER — Ambulatory Visit (INDEPENDENT_AMBULATORY_CARE_PROVIDER_SITE_OTHER): Payer: Medicare HMO | Admitting: Pulmonary Disease

## 2020-03-01 VITALS — BP 124/70 | HR 77 | Temp 97.5°F | Ht 63.0 in | Wt 158.2 lb

## 2020-03-01 DIAGNOSIS — R0601 Orthopnea: Secondary | ICD-10-CM | POA: Diagnosis not present

## 2020-03-01 DIAGNOSIS — G4733 Obstructive sleep apnea (adult) (pediatric): Secondary | ICD-10-CM

## 2020-03-01 DIAGNOSIS — J45909 Unspecified asthma, uncomplicated: Secondary | ICD-10-CM | POA: Diagnosis not present

## 2020-03-01 DIAGNOSIS — Z85850 Personal history of malignant neoplasm of thyroid: Secondary | ICD-10-CM | POA: Diagnosis not present

## 2020-03-01 DIAGNOSIS — Z9989 Dependence on other enabling machines and devices: Secondary | ICD-10-CM | POA: Diagnosis not present

## 2020-03-01 DIAGNOSIS — R918 Other nonspecific abnormal finding of lung field: Secondary | ICD-10-CM

## 2020-03-01 MED ORDER — TRELEGY ELLIPTA 100-62.5-25 MCG/INH IN AEPB: 1.0000 | INHALATION_SPRAY | Freq: Every day | RESPIRATORY_TRACT | 0 refills | Status: AC

## 2020-03-01 NOTE — Patient Instructions (Signed)
We are getting an echocardiogram to evaluate your heart function.  We will let you know about your oxygen level at nighttime as soon as we receive those results.  You have a follow-up appointment on November 4.  Continue using your Trelegy, you received 2 other samples today.

## 2020-03-01 NOTE — Progress Notes (Signed)
Subjective:    Patient ID: Brenda Barber, female    DOB: [birth year], 64 y.o.   MRN: [id number]  HPI Brenda Barber is a 64 year old woman, lifelong never smoker, with a history of asthma and thyroid cancer who presents for follow-up on multiple pulmonary nodules some which are groundglass some which are solid. She was last evaluated here in March 2021.  She has had prior navigational bronchoscopy in June 2020. He has been on surveillance after bronchoscopy was nondiagnostic.  Most recent CT scan was performed on February 05, 2020.  This showed continued slow growth of the left lower lobe nodule which had previously been biopsied.  Numerous groundglass nodules in both lungs continue to be present though unchanged.  As noted the patient has a history with stable tumor markers and no evidence of recurrence.  We saw her on 02/19/2020 to set her up for a navigational bronchoscopy.  However at that time she was very symptomatic with bronchospasm on exam and also she had significant issues with orthopnea.  She continues to have orthopnea.  She had an overnight oximetry performed last night however the results are not available.  She had a PET/CT on 7 October that shows some faint increased uptake on the left lower lobe nodule that is enlarging.  This is concerning for a slow-growing malignancy such as carcinoid and/or adenocarcinoma.  Review of Systems A 10 point review of systems was performed and it is as noted above otherwise negative.  Allergies  Allergen Reactions  . Aspirin-Dipyridamole Er Other (See Comments)    REACTION: Headache AGGRENOX   . Rosuvastatin Other (See Comments)    REACTION: Not effective   Current Meds  Medication Sig  . acetaminophen (TYLENOL) 500 MG tablet Take 500 mg by mouth every 6 (six) hours as needed for mild pain.  Marland Kitchen albuterol (PROVENTIL HFA;VENTOLIN HFA) 108 (90 BASE) MCG/ACT inhaler Inhale 2 puffs into the lungs every 6 (six) hours as needed for wheezing or shortness of breath.    Marland Kitchen apixaban (ELIQUIS) 5 MG TABS tablet Take 5 mg by mouth 2 (two) times daily.   Marland Kitchen atorvastatin (LIPITOR) 80 MG tablet Take 80 mg by mouth at bedtime.   . Azelastine HCl 0.15 % SOLN Place 1 spray into both nostrils 2 (two) times daily. Use in each nostril as directed   . benzonatate (TESSALON) 100 MG capsule Take 100 mg by mouth 3 (three) times daily as needed for cough.   . calcitRIOL (ROCALTROL) 0.25 MCG capsule Take 0.25-0.5 mcg by mouth See admin instructions. Take 2 capsules every morning and 1 capsule every evening.   . calcium carbonate (TUMS E-X 750) 750 MG chewable tablet Chew 1 tablet by mouth at bedtime.   . Fluticasone-Umeclidin-Vilant (TRELEGY ELLIPTA) 200-62.5-25 MCG/INH AEPB Inhale 1 puff into the lungs daily.  Marland Kitchen gabapentin (NEURONTIN) 100 MG capsule   . icosapent Ethyl (VASCEPA) 1 g capsule   . iron polysaccharides (NIFEREX) 150 MG capsule Take 150 mg by mouth daily.   . isosorbide mononitrate (IMDUR) 30 MG 24 hr tablet   . levothyroxine (SYNTHROID, LEVOTHROID) 137 MCG tablet Take 137 mcg by mouth daily before breakfast.   . losartan (COZAAR) 50 MG tablet Take 50 mg by mouth daily.   . montelukast (SINGULAIR) 10 MG tablet Take 10 mg by mouth at bedtime.  . Multiple Vitamin (MULTIVITAMIN) tablet Take 1 tablet by mouth daily.  . niacin 500 MG tablet Take 500 mg by mouth daily.  . nitroGLYCERIN (NITROSTAT) 0.4 MG SL  tablet Place 0.4 mg under the tongue every 5 (five) minutes as needed for chest pain.   . potassium chloride SA (K-DUR,KLOR-CON) 20 MEQ tablet Take 20 mEq by mouth daily.   Marland Kitchen Propylene Glycol (SYSTANE BALANCE) 0.6 % SOLN Place 1 drop into both eyes at bedtime.  . sotalol (BETAPACE) 80 MG tablet Take 1 tablet by mouth daily.  Marland Kitchen Spacer/Aero-Holding Chambers (AEROCHAMBER MV) inhaler Use as instructed  . sucralfate (CARAFATE) 1 g tablet Take 1 g by mouth 2 (two) times daily.  . vitamin C (ASCORBIC ACID) 500 MG tablet Take 500 mg by mouth daily.   Immunization History   Administered Date(s) Administered  . Influenza Inj Mdck Quad Pf 02/24/2018  . Influenza-Unspecified 02/23/2015, 02/03/2016, 02/25/2017, 03/09/2019  . PFIZER SARS-COV-2 Vaccination 08/18/2019, 09/08/2019  . Pneumococcal Conjugate-13 04/22/2019  . Pneumococcal Polysaccharide-23 01/16/2016  . Td 05/21/1997, 12/23/2009       Objective:   Physical Exam BP 124/70 (BP Location: Left Arm, Cuff Size: Normal)   Pulse 77   Temp (!) 97.5 F (36.4 C) (Temporal)   Ht 5\' 3"  (1.6 m)   Wt 158 lb 3.2 oz (71.8 kg)   SpO2 96%   BMI 28.02 kg/m   GENERAL: Overweight woman, no acute distress.  She is hard of hearing wears hearing aids.  She is fully ambulatory.   HEAD: Normocephalic, atraumatic.  EYES: Pupils equal, round, reactive to light.  No scleral icterus.  MOUTH: Nose/mouth/throat not examined due to masking requirements for COVID 19. NECK: Supple. No thyromegaly. Trachea midline. No JVD.  No adenopathy. PULMONARY: Good air entry bilaterally.   Lungs are clear today no adventitious sounds.   CARDIOVASCULAR: S1 and S2. Regular rate and rhythm.  No rubs, murmurs or gallops heard. ABDOMEN: Benign. MUSCULOSKELETAL: No joint deformity, no clubbing, no edema.  NEUROLOGIC: No overt focal deficit.  Mild speech impediment due to [facility].  Wears bilateral hearing aids. SKIN: Intact,warm,dry. PSYCH: Mood and behavior are normal.       Assessment & Plan:     ICD-10-CM   1. Orthopnea  R06.01 ECHOCARDIOGRAM COMPLETE   Concern with regards to cardiac dysfunction Overnight oximetry pending 2D echo to evaluate for cardiac dysfunction  2. Multiple lung nodules  R91.8    Will need repeat navigational bronchoscopy Needs to be better compensated physiologically to tolerate general anesthesia Follow-up 4 November   3. Persistent asthma without complication, unspecified asthma severity  J45.909 Fluticasone-Umeclidin-Vilant (TRELEGY ELLIPTA) 200-62.5-25 MCG/INH AEPB    Fluticasone-Umeclidin-Vilant (TRELEGY  ELLIPTA) 100-62.5-25 MCG/INH AEPB    Fluticasone-Umeclidin-Vilant (TRELEGY ELLIPTA) 100-62.5-25 MCG/INH AEPB   Continue Trelegy Bronchospasm resolved  4. OSA on CPAP  G47.33    Z99.89    This issue adds complexity to her management  5. History of thyroid cancer  Z85.850    This issue adds complexity to her management    Meds ordered this encounter  Medications  . Fluticasone-Umeclidin-Vilant (TRELEGY ELLIPTA) 100-62.5-25 MCG/INH AEPB    Sig: Inhale 1 puff into the lungs daily for 1 day.    Dispense:  14 each    Refill:  0    Order Specific Question:   Lot Number?    Answer:   [id number]    Order Specific Question:   Expiration Date?    Answer:   08/19/2021    Order Specific Question:   Manufacturer?    Answer:   [employer] [[id number]]    Order Specific Question:   Quantity    Answer:   1  .  Fluticasone-Umeclidin-Vilant (TRELEGY ELLIPTA) 100-62.5-25 MCG/INH AEPB    Sig: Inhale 1 puff into the lungs daily for 1 day.    Dispense:  14 each    Refill:  0    Order Specific Question:   Lot Number?    Answer:   [id number]    Order Specific Question:   Expiration Date?    Answer:   07/19/2021    Order Specific Question:   Manufacturer?    Answer:   [employer] [[id number]]    Order Specific Question:   Quantity    Answer:   1   Orders Placed This Encounter  Procedures  . ECHOCARDIOGRAM COMPLETE    Standing Status:   Future    Standing Expiration Date:   05/01/2020    Scheduling Instructions:     Within 1 week.    Order Specific Question:   Where should this test be performed    Answer:   [facility]    Order Specific Question:   Please indicate who you request to read the echo results.    Answer:   [facility]    Order Specific Question:   Perflutren DEFINITY (image enhancing agent) should be administered unless hypersensitivity or allergy exist    Answer:   Administer Perflutren    Order Specific Question:   Reason for exam-Echo    Answer:   Dyspnea  786.09 / R06.00    Discussion:  Patient has multiple lung nodules, nodule on the LEFT lower lobe has been growing.  It has been a minimal FDG uptake but still concerning for slow-growing malignancy.  She has multiple groundglass nodules that are unchanged.  As such she would benefit from repeat navigational bronchoscopy for diagnosis.  However, the patient is currently not well compensated and has been having issues with significant orthopnea.  Overnight oximetry was performed last night however results are not back yet.  Will await these results.  She has obstructive sleep apnea and wears CPAP but no oxygen bled in.  We will also obtain 2D echo to evaluate for potential cardiac etiology of her significant orthopnea.  We will see the patient in follow-up on 4 November.  Determine at that time if she is stable enough to undergo navigational bronchoscopy under general anesthesia.  Renold Don, MD [facility]   *This note was dictated using voice recognition software/Dragon.  Despite best efforts to proofread, errors can occur which can change the meaning.  Any change was purely unintentional.

## 2020-03-03 ENCOUNTER — Encounter: Payer: Self-pay | Admitting: Pulmonary Disease

## 2020-03-03 DIAGNOSIS — G4730 Sleep apnea, unspecified: Secondary | ICD-10-CM | POA: Diagnosis not present

## 2020-03-03 DIAGNOSIS — G4733 Obstructive sleep apnea (adult) (pediatric): Secondary | ICD-10-CM | POA: Diagnosis not present

## 2020-03-03 DIAGNOSIS — R0683 Snoring: Secondary | ICD-10-CM | POA: Diagnosis not present

## 2020-03-03 NOTE — Progress Notes (Signed)
Dr. Binnie Kand, I wanted to make sure you saw this. I have called this result to the patient. I explained that there was some indication of slow growth of the nodule we are following on the PET scan. She is scheduled to see you 11/4 at 4 pm. I told her you would discuss with her further. Let me know if you have any additional questions. Thanks so much

## 2020-03-08 ENCOUNTER — Other Ambulatory Visit: Payer: Self-pay

## 2020-03-08 ENCOUNTER — Ambulatory Visit
Admission: RE | Admit: 2020-03-08 | Discharge: 2020-03-08 | Disposition: A | Discharge status: Home / Self Care | Payer: Medicare HMO | Source: Ambulatory Visit | Attending: Pulmonary Disease | Admitting: Pulmonary Disease

## 2020-03-08 DIAGNOSIS — E785 Hyperlipidemia, unspecified: Secondary | ICD-10-CM | POA: Insufficient documentation

## 2020-03-08 DIAGNOSIS — R0600 Dyspnea, unspecified: Secondary | ICD-10-CM | POA: Diagnosis not present

## 2020-03-08 DIAGNOSIS — G4730 Sleep apnea, unspecified: Secondary | ICD-10-CM | POA: Insufficient documentation

## 2020-03-08 DIAGNOSIS — R0601 Orthopnea: Secondary | ICD-10-CM

## 2020-03-08 DIAGNOSIS — I119 Hypertensive heart disease without heart failure: Secondary | ICD-10-CM | POA: Diagnosis not present

## 2020-03-08 NOTE — Progress Notes (Signed)
*  PRELIMINARY RESULTS* Echocardiogram 2D Echocardiogram has been performed.  Brenda Barber 03/08/2020, 10:39 AM

## 2020-03-09 DIAGNOSIS — I10 Essential (primary) hypertension: Secondary | ICD-10-CM | POA: Diagnosis not present

## 2020-03-09 DIAGNOSIS — I480 Paroxysmal atrial fibrillation: Secondary | ICD-10-CM | POA: Diagnosis not present

## 2020-03-09 DIAGNOSIS — E890 Postprocedural hypothyroidism: Secondary | ICD-10-CM | POA: Diagnosis not present

## 2020-03-09 DIAGNOSIS — E782 Mixed hyperlipidemia: Secondary | ICD-10-CM | POA: Diagnosis not present

## 2020-03-09 DIAGNOSIS — D649 Anemia, unspecified: Secondary | ICD-10-CM | POA: Diagnosis not present

## 2020-03-09 DIAGNOSIS — R413 Other amnesia: Secondary | ICD-10-CM | POA: Diagnosis not present

## 2020-03-09 DIAGNOSIS — G4733 Obstructive sleep apnea (adult) (pediatric): Secondary | ICD-10-CM | POA: Diagnosis not present

## 2020-03-09 DIAGNOSIS — Z79899 Other long term (current) drug therapy: Secondary | ICD-10-CM | POA: Diagnosis not present

## 2020-03-09 DIAGNOSIS — G935 Compression of brain: Secondary | ICD-10-CM | POA: Diagnosis not present

## 2020-03-09 LAB — ECHOCARDIOGRAM COMPLETE
AR max vel: 2.36 cm2
AV Area VTI: 2.88 cm2
AV Area mean vel: 2.2 cm2
AV Mean grad: 5 mmHg
AV Peak grad: 9.1 mmHg
Ao pk vel: 1.51 m/s
Area-P 1/2: 4.89 cm2
S' Lateral: 3.41 cm

## 2020-03-14 DIAGNOSIS — G4733 Obstructive sleep apnea (adult) (pediatric): Secondary | ICD-10-CM | POA: Diagnosis not present

## 2020-03-16 DIAGNOSIS — E209 Hypoparathyroidism, unspecified: Secondary | ICD-10-CM | POA: Diagnosis not present

## 2020-03-16 DIAGNOSIS — Z0000 Encounter for general adult medical examination without abnormal findings: Secondary | ICD-10-CM | POA: Diagnosis not present

## 2020-03-16 DIAGNOSIS — I10 Essential (primary) hypertension: Secondary | ICD-10-CM | POA: Diagnosis not present

## 2020-03-16 DIAGNOSIS — I480 Paroxysmal atrial fibrillation: Secondary | ICD-10-CM | POA: Diagnosis not present

## 2020-03-16 DIAGNOSIS — G935 Compression of brain: Secondary | ICD-10-CM | POA: Diagnosis not present

## 2020-03-16 DIAGNOSIS — Z23 Encounter for immunization: Secondary | ICD-10-CM | POA: Diagnosis not present

## 2020-03-16 DIAGNOSIS — N289 Disorder of kidney and ureter, unspecified: Secondary | ICD-10-CM | POA: Diagnosis not present

## 2020-03-16 DIAGNOSIS — I700 Atherosclerosis of aorta: Secondary | ICD-10-CM | POA: Diagnosis not present

## 2020-03-16 DIAGNOSIS — J449 Chronic obstructive pulmonary disease, unspecified: Secondary | ICD-10-CM | POA: Diagnosis not present

## 2020-03-16 DIAGNOSIS — G4733 Obstructive sleep apnea (adult) (pediatric): Secondary | ICD-10-CM | POA: Diagnosis not present

## 2020-03-24 ENCOUNTER — Encounter: Payer: Self-pay | Admitting: Pulmonary Disease

## 2020-03-24 ENCOUNTER — Other Ambulatory Visit: Payer: Self-pay

## 2020-03-24 ENCOUNTER — Ambulatory Visit (INDEPENDENT_AMBULATORY_CARE_PROVIDER_SITE_OTHER): Payer: Medicare HMO | Admitting: Pulmonary Disease

## 2020-03-24 VITALS — BP 150/80 | HR 61 | Temp 97.1°F | Ht 63.0 in | Wt 160.2 lb

## 2020-03-24 DIAGNOSIS — I480 Paroxysmal atrial fibrillation: Secondary | ICD-10-CM

## 2020-03-24 DIAGNOSIS — R918 Other nonspecific abnormal finding of lung field: Secondary | ICD-10-CM | POA: Diagnosis not present

## 2020-03-24 DIAGNOSIS — Z9989 Dependence on other enabling machines and devices: Secondary | ICD-10-CM

## 2020-03-24 DIAGNOSIS — J45909 Unspecified asthma, uncomplicated: Secondary | ICD-10-CM

## 2020-03-24 DIAGNOSIS — Z85850 Personal history of malignant neoplasm of thyroid: Secondary | ICD-10-CM

## 2020-03-24 DIAGNOSIS — R0601 Orthopnea: Secondary | ICD-10-CM

## 2020-03-24 DIAGNOSIS — G4733 Obstructive sleep apnea (adult) (pediatric): Secondary | ICD-10-CM | POA: Diagnosis not present

## 2020-03-24 NOTE — Progress Notes (Signed)
Subjective:    Patient ID: Brenda Barber, female    DOB: 04-29-1956, 64 y.o.   MRN: [id number]  HPI Brenda Barber is a 64 year old woman, lifelong never smoker, with a history of asthma and thyroid cancer who presents for follow-up on multiple pulmonary nodules some which are groundglass in nature and some which are solid.  She was last seen here on 01 March 2020. She has had prior navigational bronchoscopy in June 2020.  She has been on surveillance after bronchoscopy was nondiagnostic. Most recent CT scan was performed on February 05, 2020. This showed continued slow growth of the left lower lobe nodule which had previously been biopsied. Numerous groundglass nodules in both lungs continue to be present though unchanged. As noted the patient has a history with stable tumor markers and no evidence of recurrence.  We saw her on 02/19/2020 to set her up for a navigational bronchoscopy.  However, at that time she was very symptomatic with bronchospasm on exam and also she had significant issues with orthopnea.  She continues to have orthopnea.  She had a PET/CT on 7 October that shows some faint increased uptake on the left lower lobe nodule that is enlarging.  This is concerning for a slow-growing malignancy such as carcinoid and/or adenocarcinoma.  At her prior visit we switched her to [facility] and she is doing markedly better on this.  The most concerning thing is that she continues to have significant orthopnea.  An overnight oximetry has been done 14 October while using her CPAP on room air.  She does not need supplemental O2.  A 2D echo was performed on 19 October this shows that her LV function is good and she has some aortic sclerosis.  Previously she had a left heart cath in July 2019 there is no formal report.  She was not made aware of any significant issues.  She has had a history of atrial fibrillation and is on Betapace and Eliquis.  This has made management of her mild asthma  difficult.  She does required rebronchoscopy for diagnosis of the groundglass lesions in the solid lesion noted above.   Review of Systems A 10 point review of systems was performed and it is as noted above otherwise negative.  Allergies  Allergen Reactions  . Aspirin-Dipyridamole Er Other (See Comments)    REACTION: Headache AGGRENOX   . Rosuvastatin Other (See Comments)    REACTION: Not effective   Current Meds  Medication Sig  . acetaminophen (TYLENOL) 500 MG tablet Take 500 mg by mouth every 6 (six) hours as needed for mild pain.  Marland Kitchen apixaban (ELIQUIS) 5 MG TABS tablet Take 5 mg by mouth 2 (two) times daily.   Marland Kitchen atorvastatin (LIPITOR) 80 MG tablet Take 80 mg by mouth at bedtime.   . benzonatate (TESSALON) 100 MG capsule Take 100 mg by mouth 3 (three) times daily as needed for cough.   . calcitRIOL (ROCALTROL) 0.25 MCG capsule Take 0.25-0.5 mcg by mouth See admin instructions. Take 2 capsules every morning and 1 capsule every evening.   . calcium carbonate (TUMS E-X 750) 750 MG chewable tablet Chew 1 tablet by mouth at bedtime.   . Fluticasone-Umeclidin-Vilant (TRELEGY ELLIPTA) 200-62.5-25 MCG/INH AEPB Inhale 1 puff into the lungs daily.  . iron polysaccharides (NIFEREX) 150 MG capsule Take 150 mg by mouth daily.   . isosorbide mononitrate (IMDUR) 30 MG 24 hr tablet   . levothyroxine (SYNTHROID, LEVOTHROID) 137 MCG tablet Take 137 mcg by mouth  daily before breakfast.   . losartan (COZAAR) 50 MG tablet Take 50 mg by mouth daily.   . montelukast (SINGULAIR) 10 MG tablet Take 10 mg by mouth at bedtime.  . Multiple Vitamin (MULTIVITAMIN) tablet Take 1 tablet by mouth daily.  . niacin 500 MG tablet Take 500 mg by mouth daily.  . potassium chloride SA (K-DUR,KLOR-CON) 20 MEQ tablet Take 20 mEq by mouth daily.   . predniSONE (DELTASONE) 10 MG tablet Take by mouth.  . Propylene Glycol (SYSTANE BALANCE) 0.6 % SOLN Place 1 drop into both eyes at bedtime.  . sotalol (BETAPACE) 80 MG tablet  Take 1 tablet by mouth daily.  . sucralfate (CARAFATE) 1 g tablet Take 1 g by mouth 2 (two) times daily.  . vitamin C (ASCORBIC ACID) 500 MG tablet Take 500 mg by mouth daily.   Immunization History  Administered Date(s) Administered  . Influenza Inj Mdck Quad Pf 02/24/2018  . Influenza,inj,Quad PF,6+ Mos 03/16/2020  . Influenza-Unspecified 02/23/2015, 02/03/2016, 02/25/2017, 03/09/2019  . PFIZER SARS-COV-2 Vaccination 08/18/2019, 09/08/2019  . Pneumococcal Conjugate-13 04/22/2019  . Pneumococcal Polysaccharide-23 01/16/2016  . Td 05/21/1997, 12/23/2009   Patient Active Problem List   Diagnosis Date Noted  . Facial pain 12/02/2017  . Headache disorder 12/02/2017  . Memory loss or impairment 12/02/2017  . Unstable angina ([place]) 11/14/2017  . Thyroid cancer ([place]) 09/09/2017  . CKD (chronic kidney disease) stage 3, GFR 30-59 ml/min (HCC) 01/16/2017  . Primary localized osteoarthritis of right hip 06/26/2016  . Anemia, unspecified 03/02/2016  . Ileus ([place])   . Emesis   . Abdominal pain 01/13/2016  . Chiari I malformation ([place]) 06/01/2015  . Paroxysmal A-fib ([place]) 06/01/2015  . Excessive falling 04/21/2015  . Repeated falls 04/21/2015  . Seizure ([place]) 03/16/2015  . Transient alteration of awareness 03/16/2015  . Postsurgical hypoparathyroidism ([place]) 10/31/2014  . History of thyroid cancer 04/26/2014  . Post-surgical hypothyroidism 04/26/2014  . Sleep apnea, obstructive 04/19/2014  . Multiple lung nodules 11/09/2013  . Personality disorder, depressive 05/08/2011  . GERD 12/23/2009  . HYPERGLYCEMIA 12/23/2009  . 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 GASTROENTERITIS, VIRAL 08/01/2007  . Hypothyroidism 05/23/2007  . HYPOPARATHYROIDISM 08/12/2006  . HYPERLIPIDEMIA 08/12/2006  . CORNEAL DISORDER 08/12/2006  . Benign essential hypertension 08/12/2006  . ALLERGIC RHINITIS 08/12/2006  . REACTIVE AIRWAY DISEASE 08/12/2006  . POSTMENOPAUSAL STATUS 08/12/2006  . ROSACEA 08/12/2006  . COUGH,  CHRONIC 08/12/2006  . CEREBROVASCULAR ACCIDENT, HX OF 08/12/2006  . MIGRAINES, HX OF 08/12/2006       Objective:   Physical Exam BP (!) 150/80 (BP Location: Left Arm, Patient Position: Sitting, Cuff Size: Normal)   Pulse 61   Temp (!) 97.1 F (36.2 C) (Temporal)   Ht 5\' 3"  (1.6 m)   Wt 160 lb 3.2 oz (72.7 kg)   SpO2 96%   BMI 28.38 kg/m  GENERAL:Overweight woman, no acute distress. She is hard of hearing wears hearing aids.  She is fully ambulatory.   HEAD: Normocephalic, atraumatic.  EYES: Pupils equal, round, reactive to light. No scleral icterus.  MOUTH:Nose/mouth/throat not examined due to masking requirements for COVID 19. NECK: Supple. No thyromegaly. Trachea midline. No JVD. No adenopathy. PULMONARY: Good air entry bilaterally.  Lungs are clear today no adventitious sounds.   CARDIOVASCULAR: S1 and S2. Regular rate and rhythm. A 1/6 systolic ejection murmur left sternal border.  No rubs or gallops. ABDOMEN:Benign. MUSCULOSKELETAL: No joint deformity, no clubbing, no edema.  NEUROLOGIC:No overt focal deficit. Mild speech impediment due to  HOH. Wears bilateral hearing aids. SKIN: Intact,warm,dry. PSYCH:Mood and behavior are normal.  Representative slices of scan performed 05 February 2020:        Assessment & Plan:     ICD-10-CM   1. Multiple lung nodules  R91.8 CT Super D Chest Wo Contrast    CANCELED: CT Super D Chest Wo Contrast   Will need navigational bronchoscopy to reassess Will need super dimension CT for planning Plan procedure for 01 April 2020 at 1 PM   2. Orthopnea  R06.01    Procure overnight oximetry Concern of cardiac etiology though 2D echo unremarkable  3. PAF (paroxysmal atrial fibrillation) (HCC)  I48.0    On sotalol and Eliquis Will need Eliquis held 3 days prior to procedure  4. Persistent asthma without complication, mild to moderate  J45.909    Better controlled on Trelegy Continue same Use of sotalol makes it a  challenge to control  5. OSA on CPAP  G47.33    Z99.89    Compliant with CPAP  6. History of thyroid cancer  Z85.850    This issue adds complexity to her management   Orders Placed This Encounter  Procedures  . CT Super D Chest Wo Contrast    For planning purposes Rodena Piety states appointment is not a true STAT.    Standing Status:   Future    Number of Occurrences:   1    Standing Expiration Date:   03/24/2021    Order Specific Question:   Preferred imaging location?    Answer:    [facility]    Order Specific Question:   Call Results- Best Contact Number?    Answer:   no call needed / no not hold patient    Discussion:  This is a very complex patient with a prior history of prior thyroid cancer and persistent groundglass opacities in her lung as well as a solid lesion in the left lower lobe that is slowly growing.  This lesion has low FDG uptake and may represent a slow-growing malignancy.  Given her recent issues with bronchospasm query whether this may be carcinoid.  Most worrisome issue is that she is having persistent orthopnea despite her CPAP at night.  She has not shown any evidence of CHF and does not endorse any chest pain.  We will repeat CT scan for planning purposes and tentatively plan navigational bronchoscopy on 12 November under general anesthesia.  She will have to stop her Eliquis 3 days prior to the procedure.  We will try to procure cardiology evaluation prior to that.  We will otherwise follow her here in 4 to 6 weeks after the procedure.  We will stay in touch with Dr. Rogue Bussing at the cancer center and of course notify patient with results prior to that.   Renold Don, MD [facility]   *This note was dictated using voice recognition software/Dragon.  Despite best efforts to proofread, errors can occur which can change the meaning.  Any change was purely unintentional.

## 2020-03-24 NOTE — H&P (View-Only) (Signed)
Subjective:    Patient ID: Brenda Barber, female    DOB: Sep 19, 1955, 64 y.o.   MRN: [id number]  HPI Brenda Barber is a 64 year old woman, lifelong never smoker, with a history of asthma and thyroid cancer who presents for follow-up on multiple pulmonary nodules some which are groundglass in nature and some which are solid.  She was last seen here on 01 March 2020. She has had prior navigational bronchoscopy in June 2020.  She has been on surveillance after bronchoscopy was nondiagnostic. Most recent CT scan was performed on February 05, 2020. This showed continued slow growth of the left lower lobe nodule which had previously been biopsied. Numerous groundglass nodules in both lungs continue to be present though unchanged. As noted the patient has a history with stable tumor markers and no evidence of recurrence.  We saw her on 02/19/2020 to set her up for a navigational bronchoscopy.  However, at that time she was very symptomatic with bronchospasm on exam and also she had significant issues with orthopnea.  She continues to have orthopnea.  She had a PET/CT on 7 October that shows some faint increased uptake on the left lower lobe nodule that is enlarging.  This is concerning for a slow-growing malignancy such as carcinoid and/or adenocarcinoma.  At her prior visit we switched her to [facility] and she is doing markedly better on this.  The most concerning thing is that she continues to have significant orthopnea.  An overnight oximetry has been done 14 October while using her CPAP on room air.  She does not need supplemental O2.  A 2D echo was performed on 19 October this shows that her LV function is good and she has some aortic sclerosis.  Previously she had a left heart cath in July 2019 there is no formal report.  She was not made aware of any significant issues.  She has had a history of atrial fibrillation and is on Betapace and Eliquis.  This has made management of her mild asthma  difficult.  She does required rebronchoscopy for diagnosis of the groundglass lesions in the solid lesion noted above.   Review of Systems A 10 point review of systems was performed and it is as noted above otherwise negative.  Allergies  Allergen Reactions  . Aspirin-Dipyridamole Er Other (See Comments)    REACTION: Headache AGGRENOX   . Rosuvastatin Other (See Comments)    REACTION: Not effective   Current Meds  Medication Sig  . acetaminophen (TYLENOL) 500 MG tablet Take 500 mg by mouth every 6 (six) hours as needed for mild pain.  Marland Kitchen apixaban (ELIQUIS) 5 MG TABS tablet Take 5 mg by mouth 2 (two) times daily.   Marland Kitchen atorvastatin (LIPITOR) 80 MG tablet Take 80 mg by mouth at bedtime.   . benzonatate (TESSALON) 100 MG capsule Take 100 mg by mouth 3 (three) times daily as needed for cough.   . calcitRIOL (ROCALTROL) 0.25 MCG capsule Take 0.25-0.5 mcg by mouth See admin instructions. Take 2 capsules every morning and 1 capsule every evening.   . calcium carbonate (TUMS E-X 750) 750 MG chewable tablet Chew 1 tablet by mouth at bedtime.   . Fluticasone-Umeclidin-Vilant (TRELEGY ELLIPTA) 200-62.5-25 MCG/INH AEPB Inhale 1 puff into the lungs daily.  . iron polysaccharides (NIFEREX) 150 MG capsule Take 150 mg by mouth daily.   . isosorbide mononitrate (IMDUR) 30 MG 24 hr tablet   . levothyroxine (SYNTHROID, LEVOTHROID) 137 MCG tablet Take 137 mcg by mouth  daily before breakfast.   . losartan (COZAAR) 50 MG tablet Take 50 mg by mouth daily.   . montelukast (SINGULAIR) 10 MG tablet Take 10 mg by mouth at bedtime.  . Multiple Vitamin (MULTIVITAMIN) tablet Take 1 tablet by mouth daily.  . niacin 500 MG tablet Take 500 mg by mouth daily.  . potassium chloride SA (K-DUR,KLOR-CON) 20 MEQ tablet Take 20 mEq by mouth daily.   . predniSONE (DELTASONE) 10 MG tablet Take by mouth.  . Propylene Glycol (SYSTANE BALANCE) 0.6 % SOLN Place 1 drop into both eyes at bedtime.  . sotalol (BETAPACE) 80 MG tablet  Take 1 tablet by mouth daily.  . sucralfate (CARAFATE) 1 g tablet Take 1 g by mouth 2 (two) times daily.  . vitamin C (ASCORBIC ACID) 500 MG tablet Take 500 mg by mouth daily.   Immunization History  Administered Date(s) Administered  . Influenza Inj Mdck Quad Pf 02/24/2018  . Influenza,inj,Quad PF,6+ Mos 03/16/2020  . Influenza-Unspecified 02/23/2015, 02/03/2016, 02/25/2017, 03/09/2019  . PFIZER SARS-COV-2 Vaccination 08/18/2019, 09/08/2019  . Pneumococcal Conjugate-13 04/22/2019  . Pneumococcal Polysaccharide-23 01/16/2016  . Td 05/21/1997, 12/23/2009   Patient Active Problem List   Diagnosis Date Noted  . Facial pain 12/02/2017  . Headache disorder 12/02/2017  . Memory loss or impairment 12/02/2017  . Unstable angina ([place]) 11/14/2017  . Thyroid cancer ([place]) 09/09/2017  . CKD (chronic kidney disease) stage 3, GFR 30-59 ml/min (HCC) 01/16/2017  . Primary localized osteoarthritis of right hip 06/26/2016  . Anemia, unspecified 03/02/2016  . Ileus ([place])   . Emesis   . Abdominal pain 01/13/2016  . Chiari I malformation ([place]) 06/01/2015  . Paroxysmal A-fib ([place]) 06/01/2015  . Excessive falling 04/21/2015  . Repeated falls 04/21/2015  . Seizure ([place]) 03/16/2015  . Transient alteration of awareness 03/16/2015  . Postsurgical hypoparathyroidism ([place]) 10/31/2014  . History of thyroid cancer 04/26/2014  . Post-surgical hypothyroidism 04/26/2014  . Sleep apnea, obstructive 04/19/2014  . Multiple lung nodules 11/09/2013  . Personality disorder, depressive 05/08/2011  . GERD 12/23/2009  . HYPERGLYCEMIA 12/23/2009  . 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 GASTROENTERITIS, VIRAL 08/01/2007  . Hypothyroidism 05/23/2007  . HYPOPARATHYROIDISM 08/12/2006  . HYPERLIPIDEMIA 08/12/2006  . CORNEAL DISORDER 08/12/2006  . Benign essential hypertension 08/12/2006  . ALLERGIC RHINITIS 08/12/2006  . REACTIVE AIRWAY DISEASE 08/12/2006  . POSTMENOPAUSAL STATUS 08/12/2006  . ROSACEA 08/12/2006  . COUGH,  CHRONIC 08/12/2006  . CEREBROVASCULAR ACCIDENT, HX OF 08/12/2006  . MIGRAINES, HX OF 08/12/2006       Objective:   Physical Exam BP (!) 150/80 (BP Location: Left Arm, Patient Position: Sitting, Cuff Size: Normal)   Pulse 61   Temp (!) 97.1 F (36.2 C) (Temporal)   Ht 5\' 3"  (1.6 m)   Wt 160 lb 3.2 oz (72.7 kg)   SpO2 96%   BMI 28.38 kg/m  GENERAL:Overweight woman, no acute distress. She is hard of hearing wears hearing aids.  She is fully ambulatory.   HEAD: Normocephalic, atraumatic.  EYES: Pupils equal, round, reactive to light. No scleral icterus.  MOUTH:Nose/mouth/throat not examined due to masking requirements for COVID 19. NECK: Supple. No thyromegaly. Trachea midline. No JVD. No adenopathy. PULMONARY: Good air entry bilaterally.  Lungs are clear today no adventitious sounds.   CARDIOVASCULAR: S1 and S2. Regular rate and rhythm. A 1/6 systolic ejection murmur left sternal border.  No rubs or gallops. ABDOMEN:Benign. MUSCULOSKELETAL: No joint deformity, no clubbing, no edema.  NEUROLOGIC:No overt focal deficit. Mild speech impediment due to  HOH. Wears bilateral hearing aids. SKIN: Intact,warm,dry. PSYCH:Mood and behavior are normal.  Representative slices of scan performed 05 February 2020:        Assessment & Plan:     ICD-10-CM   1. Multiple lung nodules  R91.8 CT Super D Chest Wo Contrast    CANCELED: CT Super D Chest Wo Contrast   Will need navigational bronchoscopy to reassess Will need super dimension CT for planning Plan procedure for 01 April 2020 at 1 PM   2. Orthopnea  R06.01    Procure overnight oximetry Concern of cardiac etiology though 2D echo unremarkable  3. PAF (paroxysmal atrial fibrillation) (HCC)  I48.0    On sotalol and Eliquis Will need Eliquis held 3 days prior to procedure  4. Persistent asthma without complication, mild to moderate  J45.909    Better controlled on Trelegy Continue same Use of sotalol makes it a  challenge to control  5. OSA on CPAP  G47.33    Z99.89    Compliant with CPAP  6. History of thyroid cancer  Z85.850    This issue adds complexity to her management   Orders Placed This Encounter  Procedures  . CT Super D Chest Wo Contrast    For planning purposes Rodena Piety states appointment is not a true STAT.    Standing Status:   Future    Number of Occurrences:   1    Standing Expiration Date:   03/24/2021    Order Specific Question:   Preferred imaging location?    Answer:   [facility]    Order Specific Question:   Call Results- Best Contact Number?    Answer:   no call needed / no not hold patient    Discussion:  This is a very complex patient with a prior history of prior thyroid cancer and persistent groundglass opacities in her lung as well as a solid lesion in the left lower lobe that is slowly growing.  This lesion has low FDG uptake and may represent a slow-growing malignancy.  Given her recent issues with bronchospasm query whether this may be carcinoid.  Most worrisome issue is that she is having persistent orthopnea despite her CPAP at night.  She has not shown any evidence of CHF and does not endorse any chest pain.  We will repeat CT scan for planning purposes and tentatively plan navigational bronchoscopy on 12 November under general anesthesia.  She will have to stop her Eliquis 3 days prior to the procedure.  We will try to procure cardiology evaluation prior to that.  We will otherwise follow her here in 4 to 6 weeks after the procedure.  We will stay in touch with Dr. Rogue Bussing at the cancer center and of course notify patient with results prior to that.   Renold Don, MD [facility]   *This note was dictated using voice recognition software/Dragon.  Despite best efforts to proofread, errors can occur which can change the meaning.  Any change was purely unintentional.

## 2020-03-24 NOTE — Patient Instructions (Signed)
We are going to schedule you for your procedure and will be tentatively on 12 November.  We will call you with more details.  Prior to that you are going to have a CT scan of the chest so that we can do the planning.  We will see you in follow-up in 4 to 6 weeks time after the procedure.

## 2020-03-25 ENCOUNTER — Telehealth: Payer: Self-pay | Admitting: Pulmonary Disease

## 2020-03-25 ENCOUNTER — Telehealth: Payer: Self-pay

## 2020-03-25 NOTE — Telephone Encounter (Signed)
After I spoke with the nurse reviewer Mora Appl. we were able to get the CT approved Auth  # [id number] Ct has been scheduled on 03/28/20 @ 9:30am at [facility]

## 2020-03-25 NOTE — Telephone Encounter (Signed)
I have checked and PA is not required for codes [address] [address] Refer # [id number]

## 2020-03-25 NOTE — Telephone Encounter (Addendum)
Patient is scheduled for bronch with navigation and cellvizio on 11/12/021. DXF:[id number] NZ:UDOD mass.   Rodena Piety, please see bronch info. Thanks.

## 2020-03-25 NOTE — Telephone Encounter (Signed)
Will route to [place].

## 2020-03-28 ENCOUNTER — Encounter: Payer: Self-pay | Admitting: Pulmonary Disease

## 2020-03-28 ENCOUNTER — Other Ambulatory Visit: Payer: Self-pay

## 2020-03-28 ENCOUNTER — Ambulatory Visit
Admission: RE | Admit: 2020-03-28 | Discharge: 2020-03-28 | Disposition: A | Discharge status: Home / Self Care | Payer: Medicare HMO | Source: Ambulatory Visit | Attending: Pulmonary Disease | Admitting: Pulmonary Disease

## 2020-03-28 DIAGNOSIS — R911 Solitary pulmonary nodule: Secondary | ICD-10-CM | POA: Diagnosis not present

## 2020-03-28 DIAGNOSIS — I2510 Atherosclerotic heart disease of native coronary artery without angina pectoris: Secondary | ICD-10-CM | POA: Diagnosis not present

## 2020-03-28 DIAGNOSIS — R918 Other nonspecific abnormal finding of lung field: Secondary | ICD-10-CM | POA: Diagnosis not present

## 2020-03-28 DIAGNOSIS — I240 Acute coronary thrombosis not resulting in myocardial infarction: Secondary | ICD-10-CM

## 2020-03-28 DIAGNOSIS — I700 Atherosclerosis of aorta: Secondary | ICD-10-CM | POA: Diagnosis not present

## 2020-03-28 NOTE — Telephone Encounter (Signed)
Phone per admit  03/30/2020 between 8-1 and covid test 03/31/20 between 8-1.  Patient is aware and voiced her understanding.  Nothing further needed.

## 2020-03-29 ENCOUNTER — Encounter: Payer: Self-pay | Admitting: Cardiovascular Disease

## 2020-03-29 ENCOUNTER — Ambulatory Visit (INDEPENDENT_AMBULATORY_CARE_PROVIDER_SITE_OTHER): Payer: Medicare HMO | Admitting: Cardiovascular Disease

## 2020-03-29 VITALS — BP 120/62 | HR 73 | Ht 63.0 in | Wt 160.2 lb

## 2020-03-29 DIAGNOSIS — I10 Essential (primary) hypertension: Secondary | ICD-10-CM | POA: Diagnosis not present

## 2020-03-29 DIAGNOSIS — Z01810 Encounter for preprocedural cardiovascular examination: Secondary | ICD-10-CM

## 2020-03-29 DIAGNOSIS — I480 Paroxysmal atrial fibrillation: Secondary | ICD-10-CM | POA: Diagnosis not present

## 2020-03-29 NOTE — Patient Instructions (Signed)
Medication Instructions:  Your physician recommends that you continue on your current medications as directed. Please refer to the Current Medication list given to you today.  HOLD Eliquis starting this evening. Resume when the physician performing your procedure feels it is safe.  *If you need a refill on your cardiac medications before your next appointment, please call your pharmacy*   Lab Work: None ordered If you have labs (blood work) drawn today and your tests are completely normal, you will receive your results only by: Marland Kitchen MyChart Message (if you have MyChart) OR . A paper copy in the mail If you have any lab test that is abnormal or we need to change your treatment, we will call you to review the results.   Testing/Procedures: None ordered   Follow-Up: At [facility], you and your health needs are our priority.  As part of our continuing mission to provide you with exceptional heart care, we have created designated Provider Care Teams.  These Care Teams include your primary Cardiologist (physician) and Advanced Practice Providers (APPs -  Physician Assistants and Nurse Practitioners) who all work together to provide you with the care you need, when you need it.  We recommend signing up for the patient portal called "MyChart".  Sign up information is provided on this After Visit Summary.  MyChart is used to connect with patients for Virtual Visits (Telemedicine).  Patients are able to view lab/test results, encounter notes, upcoming appointments, etc.  Non-urgent messages can be sent to your provider as well.   To learn more about what you can do with MyChart, go to [email or web address].    Your next appointment:   As needed   The format for your next appointment:   In Person  Provider:   You may see  Dr. Fletcher Anon or one of the following Advanced Practice Providers on your designated Care Team:    Murray Hodgkins, NP  Christell Faith, PA-C  Marrianne Mood,  PA-C  Cadence Kathlen Mody, [place]    Other Instructions You are considered low risk from a cardiac standpoint for your upcoming procedure with Dr. Patsey Berthold.

## 2020-03-29 NOTE — Progress Notes (Signed)
Cardiology Office Note   Date:  03/29/2020   ID:  Brenda Barber, DOB 20-Apr-1956, MRN [id number]  PCP:  [name], MD  Cardiologist: Dr. Humphrey Rolls  Chief Complaint  Patient presents with  . New Patient (Initial Visit)    Ref by Dr. Patsey Berthold for cardiac clearance prior to a lung biopsy. Meds reviewed by the pt. verbally. Pt. c/o shortness of breath.       History of Present Illness: Brenda Barber is a 64 y.o. female who was referred by Dr. Patsey Berthold for preop cardiovascular evaluation before planned endoscopic lung biopsy.  The patient has history of atrial fibrillation managed by Dr. Humphrey Rolls with sotalol and Eliquis for anticoagulation.  She has known history of hyperlipidemia, essential hypertension, hypothyroidism and mild memory loss.  She had previous cardiac catheterization done in 2019 which was personally reviewed by me.  It showed significant calcifications affecting the left main and proximal portion of the LAD.  However, the lumen was almost completely normal in all arteries with no evidence of obstructive disease.  She had a recent echocardiogram which was also personally reviewed by me and showed normal LV systolic function, mild grade 1 diastolic dysfunction and no significant valvular abnormalities.  Referred due to concerns about shortness of breath and orthopnea.  She denies any chest pain.  She does have known history of sleep apnea and uses CPAP on a regular basis.  She reports frequent wheezing.  She has no history of smoking and no family history of premature coronary artery disease.    Past Medical History:  Diagnosis Date  . Allergy   . Anemia   . Asthma   . Atrial fibrillation ([place])   . Cerebrovascular accident [facility])    History of visual deficit  . Chiari I malformation ([place])   . Coronary artery disease   . Depression   . Dyspnea   . Dysrhythmia    atrial fib  . GERD (gastroesophageal reflux disease)   . Hyperlipidemia   . Hypertension   .  Hypoparathyroidism ([place])   . Hypothyroidism   . Personality disorder, depressive   . PONV (postoperative nausea and vomiting)    difficulty breathing during endoscopy, colonoscopy performed prior w/out problems  . Renal insufficiency   . Sleep apnea    CPAP  . Thyroid cancer ([place]) 1999   Papillary  . Thyroid disease    hypothyroid     Past Surgical History:  Procedure Laterality Date  . CANNOT TOLERATE PAP / Virginal    . CATARACT EXTRACTION, BILATERAL    . ELECTROMAGNETIC NAVIGATION BROCHOSCOPY Left 10/24/2018   Procedure: ELECTROMAGNETIC NAVIGATION BRONCHOSCOPY LEFT;  Surgeon: Tyler Pita, MD;  Location: [facility] ORS;  Service: Cardiopulmonary;  Laterality: Left;  . LEFT HEART CATH AND CORONARY ANGIOGRAPHY Right 11/18/2017   Procedure: Left Heart Cath with possible coronary intervention;  Surgeon: Dionisio David, MD;  Location: [facility];  Service: Cardiovascular;  Laterality: Right;  . MRI brain  12/1999  . THYROIDECTOMY    . TONSILLECTOMY    . TOTAL HIP ARTHROPLASTY Right 06/26/2016   Procedure: TOTAL HIP ARTHROPLASTY ANTERIOR APPROACH;  Surgeon: Hessie Knows, MD;  Location: [facility] ORS;  Service: Orthopedics;  Laterality: Right;     Current Outpatient Medications  Medication Sig Dispense Refill  . acetaminophen (TYLENOL) 500 MG tablet Take 1,000 mg by mouth every 6 (six) hours as needed for mild pain.     Marland Kitchen albuterol (PROVENTIL HFA;VENTOLIN HFA) 108 (90 BASE) MCG/ACT  inhaler Inhale 2 puffs into the lungs every 6 (six) hours as needed for wheezing or shortness of breath.     Marland Kitchen apixaban (ELIQUIS) 5 MG TABS tablet Take 5 mg by mouth 2 (two) times daily.     Marland Kitchen atorvastatin (LIPITOR) 80 MG tablet Take 80 mg by mouth at bedtime.     . Azelastine HCl 0.15 % SOLN Place 1 spray into both nostrils 2 (two) times daily. Use in each nostril as directed     . benzonatate (TESSALON) 100 MG capsule Take 100 mg by mouth 3 (three) times daily as needed for cough.     . calcitRIOL  (ROCALTROL) 0.25 MCG capsule Take 0.25-0.5 mcg by mouth See admin instructions. Take 0.5 mcg by mouth every morning and 0.25 mcg every evening.    . calcium carbonate (TUMS E-X 750) 750 MG chewable tablet Chew 1 tablet by mouth at bedtime.     . Calcium Carbonate Antacid (TUMS ULTRA 1000 PO) Take 1,000 mg by mouth daily.    Marland Kitchen donepezil (ARICEPT) 10 MG tablet Take by mouth.     . Fluticasone-Umeclidin-Vilant (TRELEGY ELLIPTA) 200-62.5-25 MCG/INH AEPB Inhale 1 puff into the lungs daily.    Marland Kitchen gabapentin (NEURONTIN) 100 MG capsule     . icosapent Ethyl (VASCEPA) 1 g capsule     . iron polysaccharides (NIFEREX) 150 MG capsule Take 150 mg by mouth daily.     . isosorbide mononitrate (IMDUR) 30 MG 24 hr tablet     . levothyroxine (SYNTHROID, LEVOTHROID) 137 MCG tablet Take 137 mcg by mouth daily before breakfast.     . losartan (COZAAR) 50 MG tablet Take 50 mg by mouth daily.     . montelukast (SINGULAIR) 10 MG tablet Take 10 mg by mouth at bedtime.    . Multiple Vitamin (MULTIVITAMIN) tablet Take 1 tablet by mouth daily.    . niacin 500 MG tablet Take 500 mg by mouth daily.    . nitroGLYCERIN (NITROSTAT) 0.4 MG SL tablet Place 0.4 mg under the tongue every 5 (five) minutes as needed for chest pain.     . potassium chloride SA (K-DUR,KLOR-CON) 20 MEQ tablet Take 20 mEq by mouth daily.     . predniSONE (DELTASONE) 10 MG tablet Take by mouth.     . Propylene Glycol (SYSTANE BALANCE) 0.6 % SOLN Place 1 drop into both eyes at bedtime.    . sotalol (BETAPACE) 80 MG tablet Take 80 mg by mouth daily.     Marland Kitchen Spacer/Aero-Holding Chambers (AEROCHAMBER MV) inhaler Use as instructed 1 each 0  . sucralfate (CARAFATE) 1 g tablet Take 1 g by mouth 2 (two) times daily.    . vitamin C (ASCORBIC ACID) 500 MG tablet Take 500 mg by mouth daily.     No current facility-administered medications for this visit.    Allergies:   Aspirin-dipyridamole er and Rosuvastatin    Social History:  The patient  reports that she  has never smoked. She has never used smokeless tobacco. She reports that she does not drink alcohol and does not use drugs.   Family History:  The patient's family history includes Breast cancer (age of onset: 30) in her sister; Diabetes in her father, paternal aunt, and paternal uncle.    ROS:  Please see the history of present illness.   Otherwise, review of systems are positive for none.   All other systems are reviewed and negative.    PHYSICAL EXAM: VS:  BP 120/62 (BP Location:  Right Arm, Patient Position: Sitting, Cuff Size: Normal)   Pulse 73   Ht 5\' 3"  (1.6 m)   Wt 160 lb 4 oz (72.7 kg)   SpO2 97%   BMI 28.39 kg/m  , BMI Body mass index is 28.39 kg/m. GEN: Well nourished, well developed, in no acute distress  HEENT: normal  Neck: no JVD, carotid bruits, or masses Cardiac: RRR; no murmurs, rubs, or gallops,no edema  Respiratory:  clear to auscultation bilaterally, normal work of breathing GI: soft, nontender, nondistended, + BS MS: no deformity or atrophy  Skin: warm and dry, no rash Neuro:  Strength and sensation are intact Psych: euthymic mood, full affect   EKG:  EKG is ordered today. The ekg ordered today demonstrates normal sinus rhythm with no significant ST or T wave changes.   Recent Labs: 04/08/2019: TSH 0.221 10/06/2019: ALT 24; BUN 25; Creatinine, Ser 1.29; Hemoglobin 11.6; Platelets 221; Potassium 3.2; Sodium 143    Lipid Panel    Component Value Date/Time   CHOL 250 (H) 01/11/2012 0723   TRIG 123 01/11/2012 0723   HDL 42 01/11/2012 0723   CHOLHDL 7 12/23/2009 1040   VLDL 25 01/11/2012 0723   LDLCALC 183 (H) 01/11/2012 0723   LDLDIRECT 101.1 12/23/2009 1040      Wt Readings from Last 3 Encounters:  03/29/20 160 lb 4 oz (72.7 kg)  03/24/20 160 lb 3.2 oz (72.7 kg)  03/01/20 158 lb 3.2 oz (71.8 kg)       PAD Screen 03/29/2020  Previous PAD dx? No  Previous surgical procedure? No  Pain with walking? No  Feet/toe relief with dangling? No    Painful, non-healing ulcers? No  Extremities discolored? No      ASSESSMENT AND PLAN:  1.  Preop cardiovascular evaluation for bronchoscopy with planned lung biopsy: I reviewed the patient's previous cardiac work-up including cardiac catheterization and recent echocardiogram.  Overall, no significant abnormalities.  Currently with no anginal symptoms.  She can proceed at an overall low risk from cardiac standpoint.  2.  Orthopnea: I do not think this is due to heart failure.  There is no evidence of volume overload by physical exam.  Recent echocardiogram showed only grade 1 diastolic dysfunction and normal ejection fraction.  I suspect that her shortness of breath and orthopnea is likely due to underlying lung disease.  3.  Atrial fibrillation: Currently on sotalol.  If it is felt that sotalol is worsening her lung disease, a different medication can be considered but will leave that up to her primary cardiologist.  I asked her to start holding Eliquis now given that the procedure is scheduled on Friday.  4.  Essential hypertension: Blood pressures well controlled.  Disposition:   FU with me as needed.  Signed,  Kathlyn Sacramento, MD  03/29/2020 4:41 PM    [place]

## 2020-03-30 ENCOUNTER — Telehealth: Payer: Self-pay

## 2020-03-30 ENCOUNTER — Other Ambulatory Visit: Payer: Self-pay

## 2020-03-30 ENCOUNTER — Encounter
Admission: RE | Admit: 2020-03-30 | Discharge: 2020-03-30 | Disposition: A | Discharge status: Home / Self Care | Payer: Medicare HMO | Source: Ambulatory Visit | Attending: Pulmonary Disease | Admitting: Pulmonary Disease

## 2020-03-30 DIAGNOSIS — Z20822 Contact with and (suspected) exposure to covid-19: Secondary | ICD-10-CM | POA: Insufficient documentation

## 2020-03-30 DIAGNOSIS — Z01812 Encounter for preprocedural laboratory examination: Secondary | ICD-10-CM | POA: Insufficient documentation

## 2020-03-30 HISTORY — DX: Cardiac murmur, unspecified: R01.1

## 2020-03-30 NOTE — Pre-Procedure Instructions (Signed)
ECG 12-lead  Component 8 mo ago  Vent Rate (bpm)  71     PR Interval (msec)  146     QRS Interval (msec)  72     QT Interval (msec)  414     QTc (msec)  449     Narrative  Normal sinus rhythm  Normal ECG  When compared with ECG of 14-Dec-2016 13:57,  No significant change was found  I reviewed and concur with this report. Electronically signed MW:[id number], MD, Cristie Hem ([id number]) on 07/23/2019 4:10:05 PM Other Result Text  This result has an attachment that is not available. Specimen Collected: --  Date: 07/17/19  Received From: [facility]

## 2020-03-30 NOTE — Pre-Procedure Instructions (Signed)
Wellington Hampshire, MD  Physician  Cardiology  Progress Notes     Signed  Encounter Date:  03/29/2020      Related encounter: Office Visit from 03/29/2020 in [facility]      Signed      Expand All Collapse All  Show:Clear all [x] Manual[x] Template[] Copied  Added by: [x] Wellington Hampshire, MD  [] Hover for details     Cardiology Office Note   Date:  03/29/2020   ID:  NYJA WESTBROOK, DOB [birth year], MRN [id number]  PCP:  [name], MD           Cardiologist: Dr. Humphrey Rolls      Chief Complaint  Patient presents with  . New Patient (Initial Visit)    Ref by Dr. Patsey Berthold for cardiac clearance prior to a lung biopsy. Meds reviewed by the pt. verbally. Pt. c/o shortness of breath.       History of Present Illness: Brenda Barber is a 64 y.o. female who was referred by Dr. Patsey Berthold for preop cardiovascular evaluation before planned endoscopic lung biopsy.  The patient has history of atrial fibrillation managed by Dr. Humphrey Rolls with sotalol and Eliquis for anticoagulation.  She has known history of hyperlipidemia, essential hypertension, hypothyroidism and mild memory loss.  She had previous cardiac catheterization done in 2019 which was personally reviewed by me.  It showed significant calcifications affecting the left main and proximal portion of the LAD.  However, the lumen was almost completely normal in all arteries with no evidence of obstructive disease.  She had a recent echocardiogram which was also personally reviewed by me and showed normal LV systolic function, mild grade 1 diastolic dysfunction and no significant valvular abnormalities.  Referred due to concerns about shortness of breath and orthopnea.  She denies any chest pain.  She does have known history of sleep apnea and uses CPAP on a regular basis.  She reports frequent wheezing.  She has no history of smoking and no family history of premature coronary artery disease.        Past Medical  History:  Diagnosis Date  . Allergy   . Anemia   . Asthma   . Atrial fibrillation ([place])   . Cerebrovascular accident [facility])    History of visual deficit  . Chiari I malformation ([place])   . Coronary artery disease   . Depression   . Dyspnea   . Dysrhythmia    atrial fib  . GERD (gastroesophageal reflux disease)   . Hyperlipidemia   . Hypertension   . Hypoparathyroidism ([place])   . Hypothyroidism   . Personality disorder, depressive   . PONV (postoperative nausea and vomiting)    difficulty breathing during endoscopy, colonoscopy performed prior w/out problems  . Renal insufficiency   . Sleep apnea    CPAP  . Thyroid cancer ([place]) 1999   Papillary  . Thyroid disease    hypothyroid          Past Surgical History:  Procedure Laterality Date  . CANNOT TOLERATE PAP / Virginal    . CATARACT EXTRACTION, BILATERAL    . ELECTROMAGNETIC NAVIGATION BROCHOSCOPY Left 10/24/2018   Procedure: ELECTROMAGNETIC NAVIGATION BRONCHOSCOPY LEFT;  Surgeon: Tyler Pita, MD;  Location: [facility] ORS;  Service: Cardiopulmonary;  Laterality: Left;  . LEFT HEART CATH AND CORONARY ANGIOGRAPHY Right 11/18/2017   Procedure: Left Heart Cath with possible coronary intervention;  Surgeon: Dionisio David, MD;  Location: [place] CV LAB;  Service: Cardiovascular;  Laterality: Right;  .  MRI brain  12/1999  . THYROIDECTOMY    . TONSILLECTOMY    . TOTAL HIP ARTHROPLASTY Right 06/26/2016   Procedure: TOTAL HIP ARTHROPLASTY ANTERIOR APPROACH;  Surgeon: Hessie Knows, MD;  Location: [facility] ORS;  Service: Orthopedics;  Laterality: Right;           Current Outpatient Medications  Medication Sig Dispense Refill  . acetaminophen (TYLENOL) 500 MG tablet Take 1,000 mg by mouth every 6 (six) hours as needed for mild pain.     Marland Kitchen albuterol (PROVENTIL HFA;VENTOLIN HFA) 108 (90 BASE) MCG/ACT inhaler Inhale 2 puffs into the lungs every 6 (six) hours as needed for wheezing or  shortness of breath.     Marland Kitchen apixaban (ELIQUIS) 5 MG TABS tablet Take 5 mg by mouth 2 (two) times daily.     Marland Kitchen atorvastatin (LIPITOR) 80 MG tablet Take 80 mg by mouth at bedtime.     . Azelastine HCl 0.15 % SOLN Place 1 spray into both nostrils 2 (two) times daily. Use in each nostril as directed     . benzonatate (TESSALON) 100 MG capsule Take 100 mg by mouth 3 (three) times daily as needed for cough.     . calcitRIOL (ROCALTROL) 0.25 MCG capsule Take 0.25-0.5 mcg by mouth See admin instructions. Take 0.5 mcg by mouth every morning and 0.25 mcg every evening.    . calcium carbonate (TUMS E-X 750) 750 MG chewable tablet Chew 1 tablet by mouth at bedtime.     . Calcium Carbonate Antacid (TUMS ULTRA 1000 PO) Take 1,000 mg by mouth daily.    Marland Kitchen donepezil (ARICEPT) 10 MG tablet Take by mouth.     . Fluticasone-Umeclidin-Vilant (TRELEGY ELLIPTA) 200-62.5-25 MCG/INH AEPB Inhale 1 puff into the lungs daily.    Marland Kitchen gabapentin (NEURONTIN) 100 MG capsule     . icosapent Ethyl (VASCEPA) 1 g capsule     . iron polysaccharides (NIFEREX) 150 MG capsule Take 150 mg by mouth daily.     . isosorbide mononitrate (IMDUR) 30 MG 24 hr tablet     . levothyroxine (SYNTHROID, LEVOTHROID) 137 MCG tablet Take 137 mcg by mouth daily before breakfast.     . losartan (COZAAR) 50 MG tablet Take 50 mg by mouth daily.     . montelukast (SINGULAIR) 10 MG tablet Take 10 mg by mouth at bedtime.    . Multiple Vitamin (MULTIVITAMIN) tablet Take 1 tablet by mouth daily.    . niacin 500 MG tablet Take 500 mg by mouth daily.    . nitroGLYCERIN (NITROSTAT) 0.4 MG SL tablet Place 0.4 mg under the tongue every 5 (five) minutes as needed for chest pain.     . potassium chloride SA (K-DUR,KLOR-CON) 20 MEQ tablet Take 20 mEq by mouth daily.     . predniSONE (DELTASONE) 10 MG tablet Take by mouth.     . Propylene Glycol (SYSTANE BALANCE) 0.6 % SOLN Place 1 drop into both eyes at bedtime.    .  sotalol (BETAPACE) 80 MG tablet Take 80 mg by mouth daily.     Marland Kitchen Spacer/Aero-Holding Chambers (AEROCHAMBER MV) inhaler Use as instructed 1 each 0  . sucralfate (CARAFATE) 1 g tablet Take 1 g by mouth 2 (two) times daily.    . vitamin C (ASCORBIC ACID) 500 MG tablet Take 500 mg by mouth daily.     No current facility-administered medications for this visit.    Allergies:   Aspirin-dipyridamole er and Rosuvastatin    Social History:  The patient  reports that she has never smoked. She has never used smokeless tobacco. She reports that she does not drink alcohol and does not use drugs.   Family History:  The patient's family history includes Breast cancer (age of onset: 48) in her sister; Diabetes in her father, paternal aunt, and paternal uncle.    ROS:  Please see the history of present illness.   Otherwise, review of systems are positive for none.   All other systems are reviewed and negative.    PHYSICAL EXAM: VS:  BP 120/62 (BP Location: Right Arm, Patient Position: Sitting, Cuff Size: Normal)   Pulse 73   Ht 5\' 3"  (1.6 m)   Wt 160 lb 4 oz (72.7 kg)   SpO2 97%   BMI 28.39 kg/m  , BMI Body mass index is 28.39 kg/m. GEN: Well nourished, well developed, in no acute distress  HEENT: normal  Neck: no JVD, carotid bruits, or masses Cardiac: RRR; no murmurs, rubs, or gallops,no edema  Respiratory:  clear to auscultation bilaterally, normal work of breathing GI: soft, nontender, nondistended, + BS MS: no deformity or atrophy  Skin: warm and dry, no rash Neuro:  Strength and sensation are intact Psych: euthymic mood, full affect   EKG:  EKG is ordered today. The ekg ordered today demonstrates normal sinus rhythm with no significant ST or T wave changes.   Recent Labs: 04/08/2019: TSH 0.221 10/06/2019: ALT 24; BUN 25; Creatinine, Ser 1.29; Hemoglobin 11.6; Platelets 221; Potassium 3.2; Sodium 143    Lipid Panel Labs (Brief)          Component Value  Date/Time   CHOL 250 (H) 01/11/2012 0723   TRIG 123 01/11/2012 0723   HDL 42 01/11/2012 0723   CHOLHDL 7 12/23/2009 1040   VLDL 25 01/11/2012 0723   LDLCALC 183 (H) 01/11/2012 0723   LDLDIRECT 101.1 12/23/2009 1040           Wt Readings from Last 3 Encounters:  03/29/20 160 lb 4 oz (72.7 kg)  03/24/20 160 lb 3.2 oz (72.7 kg)  03/01/20 158 lb 3.2 oz (71.8 kg)       PAD Screen 03/29/2020  Previous PAD dx? No  Previous surgical procedure? No  Pain with walking? No  Feet/toe relief with dangling? No  Painful, non-healing ulcers? No  Extremities discolored? No      ASSESSMENT AND PLAN:  1.  Preop cardiovascular evaluation for bronchoscopy with planned lung biopsy: I reviewed the patient's previous cardiac work-up including cardiac catheterization and recent echocardiogram.  Overall, no significant abnormalities.  Currently with no anginal symptoms.  She can proceed at an overall low risk from cardiac standpoint.  2.  Orthopnea: I do not think this is due to heart failure.  There is no evidence of volume overload by physical exam.  Recent echocardiogram showed only grade 1 diastolic dysfunction and normal ejection fraction.  I suspect that her shortness of breath and orthopnea is likely due to underlying lung disease.  3.  Atrial fibrillation: Currently on sotalol.  If it is felt that sotalol is worsening her lung disease, a different medication can be considered but will leave that up to her primary cardiologist.  I asked her to start holding Eliquis now given that the procedure is scheduled on Friday.  4.  Essential hypertension: Blood pressures well controlled.  Disposition:   FU with me as needed.  Signed,  Kathlyn Sacramento, MD  03/29/2020 4:41 PM    [place]  Note Details  Author Wellington Hampshire, MD File Time 03/29/2020 5:09 PM  Author Type Physician Status Signed  Last Editor Wellington Hampshire, MD Service  Cardiology

## 2020-03-30 NOTE — Telephone Encounter (Signed)
ONO reviewed by Dr. Patsey Berthold, she advises that oxygen saturation is okay on CPAP on room air. Patient advised and understood. Nothing further needed.

## 2020-03-30 NOTE — Patient Instructions (Addendum)
Your procedure is scheduled on:04-01-20 FRIDAY Report to the Registration Desk on the 1st floor of the [facility]. To find out your arrival time, please call [phone] between 1PM - 3PM on:03-31-20 THURSDAY  REMEMBER: Instructions that are not followed completely may result in serious medical risk, up to and including death; or upon the discretion of your surgeon and anesthesiologist your surgery may need to be rescheduled.  Do not eat food after midnight the night before surgery.  No gum chewing, lozengers or hard candies.  You may however, drink CLEAR liquids up to 2 hours before you are scheduled to arrive for your surgery. Do not drink anything within 2 hours of your scheduled arrival time.  Clear liquids include: - water  - apple juice without pulp - gatorade (not RED, PURPLE, OR BLUE) - black coffee or tea (Do NOT add milk or creamers to the coffee or tea) Do NOT drink anything that is not on this list.  TAKE THESE MEDICATIONS THE MORNING OF SURGERY WITH A SIP OF WATER: -ARICEPT (DONEPEZIL) -GABAPENTIN (NEURONTIN) -SYNTHROID (LEVOTHYROXINE) -SOTALOL (BETAPACE) -IMDUR (ISOSORBIDE)  Use inhalers on the day of surgery and bring to the [place] ALBUTEROL INHALER TO [place]  Follow recommendations from Cardiologist, Pulmonologist or PCP regarding stopping Aspirin, Coumadin, Plavix, Eliquis, Pradaxa, or Pletal-LAST DOSE OF ELIQUIS WAS ON 03-28-20 MONDAY AS INSTRUCTED BY DR ARIDA  One week prior to surgery: Stop Anti-inflammatories (NSAIDS) such as Advil, Aleve, Ibuprofen, Motrin, Naproxen, Naprosyn and Aspirin based products such as Excedrin, Goodys Powder, BC Powder-OK TO TAKE TYLENOL IF NEEDED  Stop ANY OVER THE COUNTER supplements until after surgery-STOP VITAMIN C NOW-YOU MAY RESUME AFTER SURGERY (However, you may continue taking multivitamin up until the day before surgery.)  No Alcohol for 24 hours before or  after surgery.  No Smoking including e-cigarettes for 24 hours prior to surgery.  No chewable tobacco products for at least 6 hours prior to surgery.  No nicotine patches on the day of surgery.  Do not use any "recreational" drugs for at least a week prior to your surgery.  Please be advised that the combination of cocaine and anesthesia may have negative outcomes, up to and including death. If you test positive for cocaine, your surgery will be cancelled.  On the morning of surgery brush your teeth with toothpaste and water, you may rinse your mouth with mouthwash if you wish. Do not swallow any toothpaste or mouthwash.  Do not wear jewelry, make-up, hairpins, clips or nail polish.  Do not wear lotions, powders, or perfumes.   Do not shave 48 hours prior to surgery.   Contact lenses, hearing aids and dentures may not be worn into surgery.  Do not bring valuables to the hospital. [facility] is not responsible for any missing/lost belongings or valuables.   Bring your C-PAP to the hospital with you in case you may have to spend the night.   Notify your doctor if there is any change in your medical condition (cold, fever, infection).  Wear comfortable clothing (specific to your surgery type) to the hospital.  Plan for stool softeners for home use; pain medications have a tendency to cause constipation. You can also help prevent constipation by eating foods high in fiber such as fruits and vegetables and drinking plenty of fluids as your diet allows.  After surgery, you can help prevent lung complications by doing breathing exercises.  Take deep breaths and cough every 1-2  hours. Your doctor may order a device called an Incentive Spirometer to help you take deep breaths. When coughing or sneezing, hold a pillow firmly against your incision with both hands. This is called "splinting." Doing this helps protect your incision. It also decreases belly discomfort.  If you are being  admitted to the hospital overnight, leave your suitcase in the car. After surgery it may be brought to your room.  If you are being discharged the day of surgery, you will not be allowed to drive home. You will need a responsible adult (18 years or older) to drive you home and stay with you that night.   If you are taking public transportation, you will need to have a responsible adult (18 years or older) with you. Please confirm with your physician that it is acceptable to use public transportation.   Please call the [place] Dept. at [phone] if you have any questions about these instructions.  Visitation Policy:  Patients undergoing a surgery or procedure may have one family member or support person with them as [place] as that person is not COVID-19 positive or experiencing its symptoms.  That person may remain in the waiting area during the procedure.  Inpatient Visitation Update:   In an effort to ensure the safety of our team members and our patients, we are implementing a change to our visitation policy:  Effective Monday, Aug. 9, at 7 a.m., inpatients will be allowed one support person.  o The support person may change daily.  o The support person must pass our screening, gel in and out, and wear a mask at all times, including in the patient's room.  o Patients must also wear a mask when staff or their support person are in the room.  o Masking is required regardless of vaccination status.  Systemwide, no visitors 17 or younger.

## 2020-03-30 NOTE — Progress Notes (Signed)
[facility] Perioperative Services  Pre-Admission/Anesthesia Testing Clinical Review  Date: 03/30/20  Patient Demographics:  Name: Brenda Barber DOB:   1955-09-11 MRN:   [id number]  Planned Surgical Procedure(s):    Case: [id number] Date/Time: 04/01/20 1245   Procedure: VIDEO BRONCHOSCOPY WITH ENDOBRONCHIAL NAVIGATION (Left )   Anesthesia type: General   Pre-op diagnosis: LUNG MASS LEFT   Location: [facility]   Surgeons: Brenda Pita, MD      NOTE: Available PAT nursing documentation and vital signs have been reviewed. Clinical nursing staff has updated patient's PMH/PSHx, current medication list, and drug allergies/intolerances to ensure comprehensive history available to assist in medical decision making as it pertains to the aforementioned surgical procedure and anticipated anesthetic course.   Clinical Discussion:  Brenda Barber is a 64 y.o. female who is submitted for pre-surgical anesthesia review and clearance prior to her undergoing the above procedure. Patient has never been a smoker. Pertinent PMH includes: CAD, paroxysmal A. fib, cardiac murmur, CVA (2001), HTN, HLD, dyspnea, asthma, reactive airway disease, OSAH (requires nocturnal PAP therapy), hypothyroidism, hypoparathyroidism, Chiari malformation, seizures, migraines this is parental headache anemia, thyroid cancer (1999), depression, personality disorder (depressive).  Patient is followed by cardiology Brenda Anon, MD). She was last seen in the cardiology clinic on 03/29/2020; notes reviewed.  At the time of her clinic visit, patient was complaining of shortness of breath and orthopnea.  She denied any chest pain.  Patient with PMH (+) for OSAH for which she uses nocturnal PAP therapy or regular basis.  Patient reported frequent wheezing.  Patient denied any palpitations, peripheral edema, vertiginous symptoms, or presyncope/syncope.  Previous left heart  catheterization was done in 11/2017 revealed insignificant calcifications of the left main and proximal LAD; coronary anatomy with no evidence of significant obstructive disease.  Patient underwent TTE on 03/08/2020 that showed normal left regular systolic function; LVEF 79-02%.  Hypertension well controlled on ARB T-System::, beta-blocker, and nitrate therapy.  Patient is on GDMT for her HLD using a statin and omega-3 fatty acid agent.  Patient with a PMH (+) for rate controlled atrial fibrillation for which she takes sotalol; controlled.  ECG in the office revealed normal sinus rhythm with no significant ST or T wave abnormalities.  Patient being seen in preoperative consult prior to bronchoscopy with planned lung biopsy.  Per cardiology, "patient currently with no anginal symptoms.  She is optimized and may proceed with an overall low risk from a cardiac standpoint". This patient is on daily anticoagulation therapy. She has been instructed on recommendations for holding her apixaban for 3 days prior to her procedure. The patient has been instructed that her last dose of her anticoagulant will be on 03/29/2020.  She reports previous perioperative complications with anesthesia.  Patient has experienced difficulty breathing during a routine EGD in the past.  She also has a PMH (+) for PONV.  She underwent a general anesthetic course (ASA III) in 10/2018 with no documented complications.   Vitals with BMI 03/29/2020 03/29/2020 03/24/2020  Height - 5\' 3"  5\' 3"   Weight - 160 lbs 4 oz 160 lbs 3 oz  BMI - 40.97 35.32  Systolic 992 426 834  Diastolic 62 60 80  Pulse 73 73 61    Providers/Specialists:   NOTE: Primary physician provider listed below. Patient may have been seen by APP or partner within same practice.   PROVIDER ROLE LAST Brenda Divers, MD Pulmonology (Surgeon)  03/29/2020  Brenda Harrier, MD Primary Care Provider 03/16/2020  Brenda Beards, MD Cardiology 03/29/2020   Allergies:   Aspirin-dipyridamole er and Rosuvastatin  Current Home Medications:   No current facility-administered medications for this encounter.   Marland Kitchen acetaminophen (TYLENOL) 500 MG tablet  . albuterol (PROVENTIL HFA;VENTOLIN HFA) 108 (90 BASE) MCG/ACT inhaler  . apixaban (ELIQUIS) 5 MG TABS tablet  . atorvastatin (LIPITOR) 80 MG tablet  . Azelastine HCl 0.15 % SOLN  . benzonatate (TESSALON) 100 MG capsule  . calcitRIOL (ROCALTROL) 0.25 MCG capsule  . Calcium Carbonate Antacid (TUMS ULTRA 1000 PO)  . Fluticasone-Umeclidin-Vilant (TRELEGY ELLIPTA) 200-62.5-25 MCG/INH AEPB  . iron polysaccharides (NIFEREX) 150 MG capsule  . levothyroxine (SYNTHROID, LEVOTHROID) 137 MCG tablet  . losartan (COZAAR) 50 MG tablet  . montelukast (SINGULAIR) 10 MG tablet  . Multiple Vitamin (MULTIVITAMIN) tablet  . niacin 500 MG tablet  . nitroGLYCERIN (NITROSTAT) 0.4 MG SL tablet  . potassium chloride SA (K-DUR,KLOR-CON) 20 MEQ tablet  . Propylene Glycol (SYSTANE BALANCE) 0.6 % SOLN  . sotalol (BETAPACE) 80 MG tablet  . sucralfate (CARAFATE) 1 g tablet  . vitamin C (ASCORBIC ACID) 500 MG tablet  . calcium carbonate (TUMS E-X 750) 750 MG chewable tablet  . donepezil (ARICEPT) 10 MG tablet  . gabapentin (NEURONTIN) 100 MG capsule  . icosapent Ethyl (VASCEPA) 1 g capsule  . isosorbide mononitrate (IMDUR) 30 MG 24 hr tablet  . predniSONE (DELTASONE) 10 MG tablet  . Spacer/Aero-Holding Chambers (AEROCHAMBER MV) inhaler   History:   Past Medical History:  Diagnosis Date  . Allergy   . Anemia   . Asthma   . Atrial fibrillation ([place])   . Cerebrovascular accident ([place]) 2001   NO RESIDUAL EFFECTS  . Chiari I malformation ([place])   . Coronary artery disease   . Depression   . Dyspnea   . Dysrhythmia    atrial fib  . GERD (gastroesophageal reflux disease)   . Heart murmur   . Hyperlipidemia   . Hypertension   . Hypoparathyroidism ([place])   . Hypothyroidism   . Personality disorder, depressive   . PONV  (postoperative nausea and vomiting)    difficulty breathing during endoscopy, colonoscopy performed prior w/out problems-NAUSEA ONLY  . Renal insufficiency   . Sleep apnea    CPAP  . Thyroid cancer ([place]) 1999   Papillary  . Thyroid disease    hypothyroid    Past Surgical History:  Procedure Laterality Date  . CANNOT TOLERATE PAP / Virginal    . CATARACT EXTRACTION, BILATERAL    . ELECTROMAGNETIC NAVIGATION BROCHOSCOPY Left 10/24/2018   Procedure: ELECTROMAGNETIC NAVIGATION BRONCHOSCOPY LEFT;  Surgeon: Brenda Pita, MD;  Location: [facility] ORS;  Service: Cardiopulmonary;  Laterality: Left;  . LEFT HEART CATH AND CORONARY ANGIOGRAPHY Right 11/18/2017   Procedure: Left Heart Cath with possible coronary intervention;  Surgeon: Dionisio David, MD;  Location: [place] [facility];  Service: Cardiovascular;  Laterality: Right;  . MRI brain  12/1999  . THYROIDECTOMY    . TONSILLECTOMY    . TOTAL HIP ARTHROPLASTY Right 06/26/2016   Procedure: TOTAL HIP ARTHROPLASTY ANTERIOR APPROACH;  Surgeon: Hessie Knows, MD;  Location: [facility] ORS;  Service: Orthopedics;  Laterality: Right;   Family History  Problem Relation Age of Onset  . Diabetes Father   . Diabetes Paternal Aunt   . Diabetes Paternal Uncle   . Breast cancer Sister 71   Social History   Tobacco Use  . Smoking status: Never Smoker  .  Smokeless tobacco: Never Used  Vaping Use  . Vaping Use: Never used  Substance Use Topics  . Alcohol use: No  . Drug use: No    Pertinent Clinical Results:  LABS: Labs reviewed: Acceptable for surgery.          ECG: Date: 03/29/2020 Time ECG obtained: 1635 PM Rate: 73 bpm Rhythm: normal sinus Axis (leads I and aVF): Normal Intervals: PR 130 ms. QRS 72 ms. QTc 429 ms. ST segment and T wave changes: No evidence of acute ST segment elevation or depression Comparison: Similar to previous tracing obtained on 07/17/2019 at [facility] / PROCEDURES: [place]  done on 03/28/2020 1. Stable 13 mm left lower lobe pulmonary nodule. 2. Stable numerous small ground-glass nodules in both lungs. 3. No mediastinal or hilar mass or adenopathy. 4. Age advanced atherosclerotic calcifications involving the aorta and branch vessels including dense calcification involving the LAD. 5. Aortic atherosclerosis.  ECHOCARDIOGRAM done on 03/08/2020 1. Left ventricular ejection fraction, by estimation, is 55 to 60%. The left ventricle has normal function. The left ventricle has no regional wall motion abnormalities. There is mild concentric left ventricular hypertrophy. Left ventricular diastolic parameters are consistent with Grade I diastolic dysfunction (impaired relaxation).  2. Right ventricular systolic function is normal. The right ventricular size is normal.  3. The mitral valve is normal in structure. Trivial mitral valve regurgitation. No evidence of mitral stenosis. Moderate mitral annular calcification.  4. The aortic valve is normal in structure. Aortic valve regurgitation is not visualized. Mild aortic valve sclerosis is present, with no evidence of aortic valve stenosis.  5. The inferior vena cava is normal in size with greater than 50% respiratory variability, suggesting right atrial pressure of 3 mmHg.   PET SCAN SKULL BASE TO THIGH done on 02/25/2020 1. There is mild FDG uptake associated with the slowly growing, solid nodule within the left lower lobe. Cannot exclude low-grade indolent pulmonary neoplasm. 2. Multiple bilateral ground-glass attenuating lesions are again seen throughout both lungs and appear similar to recent chest CT. No significant FDG uptake identified.  3. Multifocal pulmonary adenocarcinoma remains a consideration and continued annual surveillance is recommended. 4. Aortic atherosclerosis 5. Coronary artery calcifications.  LEFT HEART CATHETERIZATION AND CORONARY ANGIOGRAPHY done on 11/26/2017 1. Normal coronary anatomy 2. LVEF  60%  BILATERAL CAROTID DOPPLER done on 12/24/2016 1. Mild (1-49%) stenosis proximal right internal carotid artery secondary to heterogenous atherosclerotic plaque. 2. No significant atherosclerotic plaque or evidence of stenosis in the left internal carotid artery. 3. Vertebral arteries are patent with normal antegrade flow   Impression and Plan:  Lonie Peak Wassink has been referred for pre-anesthesia review and clearance prior to her undergoing the planned anesthetic and procedural courses. Available labs, pertinent testing, and imaging results were personally reviewed by me. This patient has been appropriately cleared by cardiology with a low risk stratification.   Based on clinical review performed today (03/30/20), barring any significant acute changes in the patient's overall condition, it is anticipated that she will be able to proceed with the planned surgical intervention. Any acute changes in clinical condition may necessitate her procedure being postponed and/or cancelled. Pre-surgical instructions were reviewed with the patient during her PAT appointment and questions were fielded by PAT clinical staff.  Honor Loh, MSN, APRN, FNP-C, CEN [facility]  Peri-operative Services Nurse Practitioner Phone: [phone] 03/30/20 1:19 PM  NOTE: This note has been prepared using Dragon dictation software. Despite  my best ability to proofread, there is always the potential that unintentional transcriptional errors may still occur from this process.

## 2020-03-30 NOTE — Progress Notes (Signed)
Appreciate your input greatly.  Thank you.

## 2020-03-31 ENCOUNTER — Other Ambulatory Visit
Admission: RE | Admit: 2020-03-31 | Discharge: 2020-03-31 | Disposition: A | Discharge status: Home / Self Care | Payer: Medicare HMO | Source: Ambulatory Visit | Attending: Pulmonary Disease | Admitting: Pulmonary Disease

## 2020-03-31 DIAGNOSIS — Z01812 Encounter for preprocedural laboratory examination: Secondary | ICD-10-CM | POA: Diagnosis not present

## 2020-03-31 DIAGNOSIS — Z20822 Contact with and (suspected) exposure to covid-19: Secondary | ICD-10-CM | POA: Diagnosis not present

## 2020-04-01 ENCOUNTER — Encounter
Admission: RE | Disposition: A | Discharge status: Home / Self Care | Payer: Self-pay | Source: Home / Self Care | Attending: Pulmonary Disease

## 2020-04-01 ENCOUNTER — Ambulatory Visit
Admission: RE | Admit: 2020-04-01 | Discharge: 2020-04-01 | Disposition: A | Discharge status: Home / Self Care | Payer: Medicare HMO | Attending: Pulmonary Disease | Admitting: Pulmonary Disease

## 2020-04-01 ENCOUNTER — Ambulatory Visit: Payer: Medicare HMO | Admitting: Urgent Care

## 2020-04-01 ENCOUNTER — Ambulatory Visit: Payer: Medicare HMO

## 2020-04-01 ENCOUNTER — Encounter: Payer: Self-pay | Admitting: Pulmonary Disease

## 2020-04-01 ENCOUNTER — Other Ambulatory Visit: Payer: Self-pay

## 2020-04-01 DIAGNOSIS — J0410 Acute tracheitis without obstruction: Secondary | ICD-10-CM | POA: Insufficient documentation

## 2020-04-01 DIAGNOSIS — J4530 Mild persistent asthma, uncomplicated: Secondary | ICD-10-CM | POA: Diagnosis not present

## 2020-04-01 DIAGNOSIS — Z79899 Other long term (current) drug therapy: Secondary | ICD-10-CM | POA: Diagnosis not present

## 2020-04-01 DIAGNOSIS — R918 Other nonspecific abnormal finding of lung field: Secondary | ICD-10-CM

## 2020-04-01 DIAGNOSIS — Z886 Allergy status to analgesic agent status: Secondary | ICD-10-CM | POA: Diagnosis not present

## 2020-04-01 DIAGNOSIS — Z7951 Long term (current) use of inhaled steroids: Secondary | ICD-10-CM | POA: Diagnosis not present

## 2020-04-01 DIAGNOSIS — N183 Chronic kidney disease, stage 3 unspecified: Secondary | ICD-10-CM | POA: Insufficient documentation

## 2020-04-01 DIAGNOSIS — G4733 Obstructive sleep apnea (adult) (pediatric): Secondary | ICD-10-CM | POA: Diagnosis not present

## 2020-04-01 DIAGNOSIS — R0600 Dyspnea, unspecified: Secondary | ICD-10-CM | POA: Diagnosis not present

## 2020-04-01 DIAGNOSIS — Z8673 Personal history of transient ischemic attack (TIA), and cerebral infarction without residual deficits: Secondary | ICD-10-CM | POA: Diagnosis not present

## 2020-04-01 DIAGNOSIS — G4730 Sleep apnea, unspecified: Secondary | ICD-10-CM | POA: Diagnosis not present

## 2020-04-01 DIAGNOSIS — I129 Hypertensive chronic kidney disease with stage 1 through stage 4 chronic kidney disease, or unspecified chronic kidney disease: Secondary | ICD-10-CM | POA: Diagnosis not present

## 2020-04-01 DIAGNOSIS — Z85850 Personal history of malignant neoplasm of thyroid: Secondary | ICD-10-CM | POA: Insufficient documentation

## 2020-04-01 DIAGNOSIS — E039 Hypothyroidism, unspecified: Secondary | ICD-10-CM | POA: Diagnosis not present

## 2020-04-01 DIAGNOSIS — R911 Solitary pulmonary nodule: Secondary | ICD-10-CM | POA: Diagnosis not present

## 2020-04-01 DIAGNOSIS — Z9889 Other specified postprocedural states: Secondary | ICD-10-CM

## 2020-04-01 DIAGNOSIS — Z888 Allergy status to other drugs, medicaments and biological substances status: Secondary | ICD-10-CM | POA: Insufficient documentation

## 2020-04-01 DIAGNOSIS — Z7901 Long term (current) use of anticoagulants: Secondary | ICD-10-CM | POA: Diagnosis not present

## 2020-04-01 DIAGNOSIS — I482 Chronic atrial fibrillation, unspecified: Secondary | ICD-10-CM | POA: Diagnosis not present

## 2020-04-01 DIAGNOSIS — E1122 Type 2 diabetes mellitus with diabetic chronic kidney disease: Secondary | ICD-10-CM | POA: Diagnosis not present

## 2020-04-01 HISTORY — PX: VIDEO BRONCHOSCOPY WITH ENDOBRONCHIAL NAVIGATION: SHX6175

## 2020-04-01 LAB — SARS CORONAVIRUS 2 (TAT 6-24 HRS): SARS Coronavirus 2: NEGATIVE

## 2020-04-01 SURGERY — VIDEO BRONCHOSCOPY WITH ENDOBRONCHIAL NAVIGATION
Anesthesia: General | Laterality: Left

## 2020-04-01 MED ORDER — CHLORHEXIDINE GLUCONATE 0.12 % MT SOLN: 15.0000 mL | Freq: Once | OROMUCOSAL | Status: AC

## 2020-04-01 MED ORDER — DEXAMETHASONE SODIUM PHOSPHATE 10 MG/ML IJ SOLN
INTRAMUSCULAR | Status: DC | PRN
  Administered 2020-04-01: 10 mg via INTRAVENOUS

## 2020-04-01 MED ORDER — ORAL CARE MOUTH RINSE: 15.0000 mL | Freq: Once | OROMUCOSAL | Status: AC

## 2020-04-01 MED ORDER — SODIUM CHLORIDE 0.9 % IV SOLN: Freq: Once | INTRAVENOUS | Status: DC

## 2020-04-01 MED ORDER — GLYCOPYRROLATE 0.2 MG/ML IJ SOLN
INTRAMUSCULAR | Status: AC
  Filled 2020-04-01: qty 1

## 2020-04-01 MED ORDER — PROMETHAZINE HCL 25 MG/ML IJ SOLN: 6.2500 mg | INTRAMUSCULAR | Status: DC | PRN

## 2020-04-01 MED ORDER — FAMOTIDINE 20 MG PO TABS
ORAL_TABLET | ORAL | Status: AC
  Administered 2020-04-01: 20 mg via ORAL
  Filled 2020-04-01: qty 1

## 2020-04-01 MED ORDER — PROPOFOL 500 MG/50ML IV EMUL
INTRAVENOUS | Status: DC | PRN
  Administered 2020-04-01: 150 ug/kg/min via INTRAVENOUS

## 2020-04-01 MED ORDER — PROPOFOL 500 MG/50ML IV EMUL
INTRAVENOUS | Status: AC
  Filled 2020-04-01: qty 50

## 2020-04-01 MED ORDER — MIDAZOLAM HCL 2 MG/2ML IJ SOLN
INTRAMUSCULAR | Status: DC | PRN
  Administered 2020-04-01: 1 mg via INTRAVENOUS

## 2020-04-01 MED ORDER — FENTANYL CITRATE (PF) 100 MCG/2ML IJ SOLN
INTRAMUSCULAR | Status: AC
  Filled 2020-04-01: qty 2

## 2020-04-01 MED ORDER — LIDOCAINE HCL (CARDIAC) PF 100 MG/5ML IV SOSY
PREFILLED_SYRINGE | INTRAVENOUS | Status: DC | PRN
  Administered 2020-04-01: 80 mg via INTRAVENOUS

## 2020-04-01 MED ORDER — BUTAMBEN-TETRACAINE-BENZOCAINE 2-2-14 % EX AERO
1.0000 | INHALATION_SPRAY | Freq: Once | CUTANEOUS | Status: DC
  Filled 2020-04-01: qty 20

## 2020-04-01 MED ORDER — MIDAZOLAM HCL 2 MG/2ML IJ SOLN
INTRAMUSCULAR | Status: AC
  Filled 2020-04-01: qty 2

## 2020-04-01 MED ORDER — FENTANYL CITRATE (PF) 100 MCG/2ML IJ SOLN
INTRAMUSCULAR | Status: DC | PRN
  Administered 2020-04-01 (×2): 25 ug via INTRAVENOUS
  Administered 2020-04-01: 50 ug via INTRAVENOUS

## 2020-04-01 MED ORDER — CHLORHEXIDINE GLUCONATE 0.12 % MT SOLN
OROMUCOSAL | Status: AC
  Administered 2020-04-01: 15 mL via OROMUCOSAL
  Filled 2020-04-01: qty 15

## 2020-04-01 MED ORDER — ROCURONIUM BROMIDE 10 MG/ML (PF) SYRINGE
PREFILLED_SYRINGE | INTRAVENOUS | Status: AC
  Filled 2020-04-01: qty 10

## 2020-04-01 MED ORDER — SUCCINYLCHOLINE CHLORIDE 200 MG/10ML IV SOSY
PREFILLED_SYRINGE | INTRAVENOUS | Status: AC
  Filled 2020-04-01: qty 10

## 2020-04-01 MED ORDER — PROPOFOL 10 MG/ML IV BOLUS
INTRAVENOUS | Status: DC | PRN
  Administered 2020-04-01: 100 mg via INTRAVENOUS

## 2020-04-01 MED ORDER — FAMOTIDINE 20 MG PO TABS: 20.0000 mg | ORAL_TABLET | Freq: Once | ORAL | Status: AC

## 2020-04-01 MED ORDER — DEXAMETHASONE SODIUM PHOSPHATE 10 MG/ML IJ SOLN
INTRAMUSCULAR | Status: AC
  Filled 2020-04-01: qty 1

## 2020-04-01 MED ORDER — SUGAMMADEX SODIUM 200 MG/2ML IV SOLN
INTRAVENOUS | Status: DC | PRN
  Administered 2020-04-01: 400 mg via INTRAVENOUS

## 2020-04-01 MED ORDER — LACTATED RINGERS IV SOLN: INTRAVENOUS | Status: DC

## 2020-04-01 MED ORDER — IPRATROPIUM-ALBUTEROL 0.5-2.5 (3) MG/3ML IN SOLN: 3.0000 mL | Freq: Once | RESPIRATORY_TRACT | Status: AC

## 2020-04-01 MED ORDER — FENTANYL CITRATE (PF) 100 MCG/2ML IJ SOLN: 25.0000 ug | INTRAMUSCULAR | Status: DC | PRN

## 2020-04-01 MED ORDER — ONDANSETRON HCL 4 MG/2ML IJ SOLN
INTRAMUSCULAR | Status: AC
  Filled 2020-04-01: qty 2

## 2020-04-01 MED ORDER — IPRATROPIUM-ALBUTEROL 0.5-2.5 (3) MG/3ML IN SOLN
RESPIRATORY_TRACT | Status: AC
  Administered 2020-04-01: 3 mL via RESPIRATORY_TRACT
  Filled 2020-04-01: qty 3

## 2020-04-01 MED ORDER — LIDOCAINE HCL (PF) 2 % IJ SOLN
INTRAMUSCULAR | Status: AC
  Filled 2020-04-01: qty 5

## 2020-04-01 MED ORDER — PROPOFOL 10 MG/ML IV BOLUS
INTRAVENOUS | Status: AC
  Filled 2020-04-01: qty 20

## 2020-04-01 MED ORDER — ROCURONIUM BROMIDE 100 MG/10ML IV SOLN
INTRAVENOUS | Status: DC | PRN
  Administered 2020-04-01: 5 mg via INTRAVENOUS
  Administered 2020-04-01: 40 mg via INTRAVENOUS

## 2020-04-01 MED ORDER — ONDANSETRON HCL 4 MG/2ML IJ SOLN
INTRAMUSCULAR | Status: DC | PRN
  Administered 2020-04-01: 4 mg via INTRAVENOUS

## 2020-04-01 NOTE — Discharge Instructions (Addendum)
You may start your Eliquis tonight   Flexible Bronchoscopy, Care After This sheet gives you information about how to care for yourself after your test. Your doctor may also give you more specific instructions. If you have problems or questions, contact your doctor. Follow these instructions at home: Eating and drinking  Do not eat or drink anything (not even water) for 2 hours after your test, or until your numbing medicine (local anesthetic) wears off.  When your numbness is gone and your cough and gag reflexes have come back, you may: ? Eat only soft foods. ? Slowly drink liquids.  The day after the test, go back to your normal diet. Driving  Do not drive for 24 hours if you were given a medicine to help you relax (sedative).  Do not drive or use heavy machinery while taking prescription pain medicine. General instructions   Take over-the-counter and prescription medicines only as told by your doctor.  Return to your normal activities as told. Ask what activities are safe for you.  Do not use any products that have nicotine or tobacco in them. This includes cigarettes and e-cigarettes. If you need help quitting, ask your doctor.  Keep all follow-up visits as told by your doctor. This is important. It is very important if you had a tissue sample (biopsy) taken. Get help right away if:  You have shortness of breath that gets worse.  You get light-headed.  You feel like you are going to pass out (faint).  You have chest pain.  You cough up: ? More than a little blood. ? More blood than before. Summary  Do not eat or drink anything (not even water) for 2 hours after your test, or until your numbing medicine wears off.  Do not use cigarettes. Do not use e-cigarettes.  Get help right away if you have chest pain. This information is not intended to replace advice given to you by your health care provider. Make sure you discuss any questions you have with your health care  provider. Document Revised: 04/19/2017 Document Reviewed: 05/25/2016 Elsevier Patient Education  2020 [place]   1) The drugs that you were given will stay in your system until tomorrow so for the next 24 hours you should not:  A) Drive an automobile B) Make any legal decisions C) Drink any alcoholic beverage   2) You may resume regular meals tomorrow.  Today it is better to start with liquids and gradually work up to solid foods.  You may eat anything you prefer, but it is better to start with liquids, then soup and crackers, and gradually work up to solid foods.   3) Please notify your doctor immediately if you have any unusual bleeding, trouble breathing, redness and pain at the surgery site, drainage, fever, or pain not relieved by medication.    4) Additional Instructions:        Please contact your physician with any problems or Same Day Surgery at [phone], Monday through Friday 6 am to 4 pm, or [place] [facility] number at [phone].

## 2020-04-01 NOTE — Anesthesia Postprocedure Evaluation (Signed)
Anesthesia Post Note  Patient: Brenda Barber  Procedure(s) Performed: VIDEO BRONCHOSCOPY WITH ENDOBRONCHIAL NAVIGATION (Left )  Patient location during evaluation: PACU Anesthesia Type: General Level of consciousness: awake and awake and alert Pain management: pain level controlled Vital Signs Assessment: post-procedure vital signs reviewed and stable Respiratory status: spontaneous breathing and nonlabored ventilation Cardiovascular status: blood pressure returned to baseline and stable Postop Assessment: no apparent nausea or vomiting Anesthetic complications: no   No complications documented.   Last Vitals:  Vitals:   04/01/20 1140 04/01/20 1507  BP: (!) 143/84 (!) 145/78  Pulse: 63   Resp: 16   Temp: 36.4 C (!) 36 C  SpO2: 98%     Last Pain:  Vitals:   04/01/20 1507  TempSrc:   PainSc: Asleep                 Neva Seat

## 2020-04-01 NOTE — Anesthesia Preprocedure Evaluation (Signed)
Anesthesia Evaluation  Patient identified by MRN, date of birth, ID band Patient awake    Reviewed: Allergy & Precautions, NPO status , Patient's Chart, lab work & pertinent test results  History of Anesthesia Complications (+) PONV and history of anesthetic complications  Airway Mallampati: III  TM Distance: >3 FB Neck ROM: Full    Dental  (+) Poor Dentition   Pulmonary asthma , sleep apnea and Continuous Positive Airway Pressure Ventilation ,    breath sounds clear to auscultation- rhonchi (-) wheezing      Cardiovascular hypertension, (-) CAD, (-) Past MI, (-) Cardiac Stents and (-) CABG + dysrhythmias Atrial Fibrillation  Rhythm:Regular Rate:Normal - Systolic murmurs and - Diastolic murmurs    Neuro/Psych  Headaches, PSYCHIATRIC DISORDERS Depression CVA, No Residual Symptoms    GI/Hepatic Neg liver ROS, GERD  ,  Endo/Other  neg diabetesHypothyroidism   Renal/GU CRFRenal disease     Musculoskeletal  (+) Arthritis ,   Abdominal (+) - obese,   Peds  Hematology  (+) anemia ,   Anesthesia Other Findings Past Medical History: No date: Allergy No date: Anemia No date: Asthma No date: Atrial fibrillation [facility]) 2001: Cerebrovascular accident [facility])     Comment:  NO RESIDUAL EFFECTS No date: Chiari I malformation ([place]) No date: Coronary artery disease No date: Depression No date: Dyspnea No date: Dysrhythmia     Comment:  atrial fib No date: GERD (gastroesophageal reflux disease) No date: Heart murmur No date: Hyperlipidemia No date: Hypertension No date: Hypoparathyroidism (HCC) No date: Hypothyroidism No date: Personality disorder, depressive No date: PONV (postoperative nausea and vomiting)     Comment:  difficulty breathing during endoscopy, colonoscopy               performed prior w/out problems-NAUSEA ONLY No date: Renal insufficiency No date: Sleep apnea     Comment:  CPAP 1999: Thyroid cancer  ([place])     Comment:  Papillary No date: Thyroid disease     Comment:  hypothyroid    Reproductive/Obstetrics                             Anesthesia Physical Anesthesia Plan  ASA: III  Anesthesia Plan: General   Post-op Pain Management:    Induction: Intravenous  PONV Risk Score and Plan: 3 and TIVA, Propofol infusion and Ondansetron  Airway Management Planned: Oral ETT  Additional Equipment:   Intra-op Plan:   Post-operative Plan: Extubation in OR  Informed Consent: I have reviewed the patients History and Physical, chart, labs and discussed the procedure including the risks, benefits and alternatives for the proposed anesthesia with the patient or authorized representative who has indicated his/her understanding and acceptance.     Dental advisory given  Plan Discussed with: CRNA and Anesthesiologist  Anesthesia Plan Comments:         Anesthesia Quick Evaluation

## 2020-04-01 NOTE — Op Note (Signed)
Electromagnetic Navigation Bronchoscopy Cellvizio probe based confocal laser endomicroscopy (pCLE)  Indication: 1) enlarging LEFT lower lobe nodule 2) multiple groundglass opacities, largest LEFT upper lobe 3) history of thyroid carcinoma.  Preoperative Diagnosis: Multiple lung nodules/groundglass opacities Post Procedure Diagnosis: Same as above Consent: Verbal/Written  Benefits, limitations and potential complications of the procedure were discussed with the patient/family  including, but not limited to bleeding, hemoptysis, respiratory failure requiring intubation and/or prolongued mechanical ventilation, infection, pneumothorax (collapse of lung) requiring chest tube placement, stroke or even death.  Type of Anesthesia: General endotracheal  Surgeon: Renold Don, MD Assistant/Scrub: Sullivan Lone, RRT Anesthesiologist/CRRT: Emmie Niemann, MD/Stephanie Michelet, CRNA ROSE available?:  Yes, [facility]  Procedure Performed:  Virtual Bronchoscopy with Multi-planar Image analysis, 3-D reconstruction of coronal, sagittal and multi-planar images for the purposes of planning real-time bronchoscopy using the iLogic Electromagnetic Navigation Bronchoscopy System (superDimension).  Intraoperative Cellvizio probe based confocal laser endomicroscopy (pCLE).  Description of Procedure:The patient was taken to Procedure Room 2 (Bronchoscopy Suite) where appropriate timeout was taken.  Patient was placed on the superDimension table.  Patient was then inducted under general anesthesia by the anesthesia team.  He was intubated with an 8.5 ETT without difficulty.  A Portex adapter was placed on the ETT flange.  At this point the Olympus video bronchoscope was advanced through the Portex adapter and anatomic tour of the airway was performed.  Secretions were scant.  The visible trachea was normal, carina was sharp, inspection of the right lung showed no abnormalities or endobronchial lesions on the right upper  lobe, right middle lobe and right lower lobe subsegments.  Inspection of the left lung showed no endobronchial lesionson left upper lobe, lingula and lower lobe subsegments.  At this point the superDimension extended working channel and locatable guide (LG) were advanced through the working channel of the bronchoscope.The catheter/LG combo was then placed into the central portion of the trachea. The LG/catheter combo was directed to standard registration points at the following centers: main carina, right upper lobe bronchus, right lower lobe bronchus, right middle lobe bronchus, left upper lobe bronchus, and the left lower lobe bronchus. This data was transferred to the i-Logic ENB system for real-time bronchoscopy.  Once registration was completed, the scope was advanced until it could go no further and then the extended working channel/LG combo was navigated to the LEFT lower lobe for tissue sampling.  The position of the LG was confirmed with fluoroscopy.  At this point the LG was removed and the Cellvizio probe based confocal laser endomicroscopy (pCLE) probe was advanced through the extended working channel and abnormal tissue was confirmed.  Once this was performed, the pCLE probe was removed and and transbronchial brushings were performed.  ROSE revealed mostly inflammatory cells, additional brushings were performed a total of 4 brushes were utilized.  Each with 3 passes performed in this area.  Trajectory was reconfirmed between brushings with LG and with pCLE probe to ensure the working channel had not migrated from the target.  The target was not easily visible by fluoroscopy.  PCLE images showed abnormal tissue only at the edge of the field of view.  In similar fashion transbronchial biopsies were performed with ConMed Precisor and superDimension biopsy forceps.  Sampling was difficult due to inability to visualize the lesion well under fluoroscopy, confirmation of target was done with LG and pCLe when  changing biopsy forceps.  A total of 12 specimens were obtained.  ROSE did not show overt malignancy.  At this point  an arc point 21-gauge superDimension needle was utilized on the fluoroscopy to sample the area and additional 4 more times.  After this was completed a targeted bronchoalveolar lavage was performed on the left lower lobe, 40 mL of saline were instilled yielding approximately 10 mL of aliquot.   After completion of the above, the LG was then replaced into the extended working channel and navigation was done to the target on the LEFT upper lobe groundglass opacity.  Target was acquired without difficulty.  Confirmation of target acquisition was performed with fluoroscopy.  At this point Cellvizio probe based confocal laser endomicroscopy (pCLE) was performed and the images were consistent with adenocarcinoma.  The lesion was not visible by fluoroscopy.  Brushings were performed of this area x3.  Targeted bronchoalveolar lavage was then performed with 40 mL of saline instilled and a yield of approximately 11 mL of aliquot.  This was placed in CytoLyt preservative.  Once  this portion of the procedure was completed examination of the airway showed excellent hemostasis. Patient then received 10 mL of 1% lidocaine via bronchial lavage and the bronchoscope was retrieved.  The procedure was at this point terminated.  Patient was allowed to emerge from general anesthesia and was transferred to the PACU in satisfactory condition.  Postprocedural chest x-ray showed no pneumothorax.  Specimens Obtained:  Transbronchial forceps biopsies x12, left lower lobe  Transbronchial brushings x4 (3 passes each), left lower lobe and x3 passes left upper lobe.  Bronchoalveolar lavage, targeted at left lower lobe and left upper lobe: Approximately 10 mL aliquot each  Transbronchial Needle aspirate, left lower lobe x4  Fluoroscopy:  Fluoroscopy was utilized during the course of this procedure to assure that  biopsies were taken in a safe manner under fluoroscopic guidance with spot films required.  LEFT lower lobe:     LEFT upper lobe:    Additional photos can be seen on follow-up progress note.  Complications:None, postprocedure chest x-ray without pneumothorax, see chest x-ray below:   Estimated Blood Loss: Nil    Assessment and Plan/Additional Comments: 1) LEFT lower lobe nodule rule out CA 2) Multiple groundglass opacities, index on LEFT upper lobe, query adenocarcinoma 3) history of thyroid cancer 4) await pathology reports   C. Derrill Kay, MD  PCCM   *This note was dictated using voice recognition software/Dragon.  Despite best efforts to proofread, errors can occur which can change the meaning.  Any change was purely unintentional.

## 2020-04-01 NOTE — Transfer of Care (Signed)
Immediate Anesthesia Transfer of Care Note  Patient: Brenda Barber  Procedure(s) Performed: VIDEO BRONCHOSCOPY WITH ENDOBRONCHIAL NAVIGATION (Left )  Patient Location: PACU  Anesthesia Type:General  Level of Consciousness: awake and drowsy  Airway & Oxygen Therapy: Patient Spontanous Breathing and Patient connected to face mask oxygen  Post-op Assessment: Report given to RN and Post -op Vital signs reviewed and stable  Post vital signs: Reviewed and stable  Last Vitals:  Vitals Value Taken Time  BP 145/78 04/01/20 1507  Temp 36 C 04/01/20 1507  Pulse 55 04/01/20 1509  Resp 24 04/01/20 1509  SpO2 100 % 04/01/20 1509  Vitals shown include unvalidated device data.  Last Pain:  Vitals:   04/01/20 1507  TempSrc:   PainSc: Asleep         Complications: No complications documented.

## 2020-04-01 NOTE — Anesthesia Procedure Notes (Signed)
Procedure Name: Intubation Date/Time: 04/01/2020 1:14 PM Performed by: Johnna Acosta, CRNA Pre-anesthesia Checklist: Patient identified, Emergency Drugs available, Suction available, Timeout performed and Patient being monitored Patient Re-evaluated:Patient Re-evaluated prior to induction Oxygen Delivery Method: Circle system utilized Preoxygenation: Pre-oxygenation with 100% oxygen Induction Type: IV induction Ventilation: Mask ventilation without difficulty and Oral airway inserted - appropriate to patient size Laryngoscope Size: McGraph and 3 Grade View: Grade II Tube type: Oral Tube size: 8.5 mm Number of attempts: 1 Airway Equipment and Method: Stylet and Rigid stylet Placement Confirmation: ETT inserted through vocal cords under direct vision,  positive ETCO2 and breath sounds checked- equal and bilateral Secured at: 22 cm Tube secured with: Tape Dental Injury: Teeth and Oropharynx as per pre-operative assessment

## 2020-04-01 NOTE — Interval H&P Note (Signed)
History and Physical Interval Note:  04/01/2020 12:25 PM  Brenda Barber  has presented today for surgery, with the diagnosis of LUNG MASS LEFT.  The various methods of treatment have been discussed with the patient and family. After consideration of risks, benefits and other options for treatment, the patient has consented to  Procedure(s): [place] (Left) as a surgical intervention.  The patient's history has been reviewed, patient examined, no change in status, stable for surgery.  I have reviewed the patient's chart and labs.  Questions were answered to the patient's satisfaction.     Renold Don, MD [place] [facility]

## 2020-04-01 NOTE — Progress Notes (Addendum)
See associated documents (scans) from Cellvizio probe based confocal laser endomicroscopy (pCLE) for LEFT upper lobe GGO and left lower lobe nodule.  Read arrow on left lower lobe nodule photo shows area of abnormality on the periphery of the probe imaging.  Renold Don, MD [place] [facility]

## 2020-04-04 ENCOUNTER — Encounter: Payer: Self-pay | Admitting: Pulmonary Disease

## 2020-04-05 ENCOUNTER — Other Ambulatory Visit: Payer: Medicare HMO

## 2020-04-05 ENCOUNTER — Ambulatory Visit: Payer: Medicare HMO | Admitting: Internal Medicine

## 2020-04-05 LAB — CYTOLOGY - NON PAP

## 2020-04-05 LAB — SURGICAL PATHOLOGY

## 2020-04-06 ENCOUNTER — Telehealth: Payer: Self-pay

## 2020-04-06 NOTE — Telephone Encounter (Signed)
Pt returning call.  [phone]

## 2020-04-06 NOTE — Telephone Encounter (Signed)
Patient has been scheduled for OV on 04/08/2020 at 9:00. Nothing further needed.

## 2020-04-06 NOTE — Telephone Encounter (Signed)
Lm for patient to offer OV with Dr. Patsey Berthold on 04/08/2020 at 9:00.

## 2020-04-07 DIAGNOSIS — E785 Hyperlipidemia, unspecified: Secondary | ICD-10-CM | POA: Diagnosis not present

## 2020-04-07 DIAGNOSIS — I10 Essential (primary) hypertension: Secondary | ICD-10-CM | POA: Diagnosis not present

## 2020-04-07 DIAGNOSIS — I2510 Atherosclerotic heart disease of native coronary artery without angina pectoris: Secondary | ICD-10-CM | POA: Diagnosis not present

## 2020-04-08 ENCOUNTER — Other Ambulatory Visit: Payer: Self-pay

## 2020-04-08 ENCOUNTER — Encounter: Payer: Self-pay | Admitting: Pulmonary Disease

## 2020-04-08 ENCOUNTER — Ambulatory Visit (INDEPENDENT_AMBULATORY_CARE_PROVIDER_SITE_OTHER): Payer: Medicare HMO | Admitting: Pulmonary Disease

## 2020-04-08 VITALS — BP 124/80 | HR 80 | Temp 97.1°F | Ht 63.0 in | Wt 156.6 lb

## 2020-04-08 DIAGNOSIS — R0600 Dyspnea, unspecified: Secondary | ICD-10-CM | POA: Diagnosis not present

## 2020-04-08 DIAGNOSIS — R918 Other nonspecific abnormal finding of lung field: Secondary | ICD-10-CM | POA: Diagnosis not present

## 2020-04-08 DIAGNOSIS — J45909 Unspecified asthma, uncomplicated: Secondary | ICD-10-CM

## 2020-04-08 DIAGNOSIS — Z9989 Dependence on other enabling machines and devices: Secondary | ICD-10-CM

## 2020-04-08 DIAGNOSIS — R0609 Other forms of dyspnea: Secondary | ICD-10-CM

## 2020-04-08 DIAGNOSIS — J0410 Acute tracheitis without obstruction: Secondary | ICD-10-CM

## 2020-04-08 DIAGNOSIS — G4733 Obstructive sleep apnea (adult) (pediatric): Secondary | ICD-10-CM

## 2020-04-08 MED ORDER — DOXYCYCLINE HYCLATE 100 MG PO TABS: 100.0000 mg | ORAL_TABLET | Freq: Two times a day (BID) | ORAL | 0 refills | Status: DC

## 2020-04-08 NOTE — Patient Instructions (Addendum)
You did very well in your walking test today.  Continue Trelegy Ellipta 200/62.5/25.  We are releasing you back to the care of Dr. Raul Del.  Please make sure you make an appointment with him within the next 2 to 3 months.  Continue using your CPAP.  I did send in for an antibiotic to your pharmacy as you are having some productive cough after your bronchoscopy.

## 2020-04-08 NOTE — Progress Notes (Addendum)
Subjective:    Patient ID: Brenda Barber, female    DOB: 12/03/1955, 64 y.o.   MRN: [id number]  HPI Brenda Barber presents for follow-up she is a 64 year old lifelong never smoker with the issue of bilateral pulmonary nodules some groundglass some solid.  She has undergone navigational bronchoscopy on 01 April 2020.  The bronchoscopy was again nondiagnostic.  Prior navigational bronchoscopy was performed in June 2020.  I will discuss ongoing follow-up with Dr. Rogue Bussing.  Since her bronchoscopy she has done well.  She continues to have some issues with dyspnea on exertion but orthopnea has gotten better.  She is on CPAP and prior overnight oximetry shows that she does not require supplemental oxygen with this.  She has had some cough productive of yellowish sputum with some tinges of blood after the bronchoscopy.  No fevers, chills or sweats.  No other symptomatology.  Overall she is feeling better.  Recall that she also had her inhaler switched to [place] for management of her asthma and she is doing well with this.  Ongoing issues with dyspnea may be related to Betapace which she takes for paroxysmal atrial fibrillation.  She is also on Eliquis.   Review of Systems A 10 point review of systems was performed and it is as noted above otherwise negative.  Patient Active Problem List   Diagnosis Date Noted  . Facial pain 12/02/2017  . Headache disorder 12/02/2017  . Memory loss or impairment 12/02/2017  . Unstable angina ([place]) 11/14/2017  . Thyroid cancer ([place]) 09/09/2017  . CKD (chronic kidney disease) stage 3, GFR 30-59 ml/min (HCC) 01/16/2017  . Primary localized osteoarthritis of right hip 06/26/2016  . Anemia, unspecified 03/02/2016  . Ileus ([place])   . Emesis   . Abdominal pain 01/13/2016  . Chiari I malformation ([place]) 06/01/2015  . Paroxysmal A-fib ([place]) 06/01/2015  . Excessive falling 04/21/2015  . Repeated falls 04/21/2015  . Seizure ([place]) 03/16/2015  . Transient alteration of  awareness 03/16/2015  . Postsurgical hypoparathyroidism ([place]) 10/31/2014  . History of thyroid cancer 04/26/2014  . Post-surgical hypothyroidism 04/26/2014  . Sleep apnea, obstructive 04/19/2014  . Multiple lung nodules 11/09/2013  . Personality disorder, depressive 05/08/2011  . GERD 12/23/2009  . HYPERGLYCEMIA 12/23/2009  . 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 GASTROENTERITIS, VIRAL 08/01/2007  . Hypothyroidism 05/23/2007  . HYPOPARATHYROIDISM 08/12/2006  . HYPERLIPIDEMIA 08/12/2006  . CORNEAL DISORDER 08/12/2006  . Benign essential hypertension 08/12/2006  . ALLERGIC RHINITIS 08/12/2006  . REACTIVE AIRWAY DISEASE 08/12/2006  . POSTMENOPAUSAL STATUS 08/12/2006  . ROSACEA 08/12/2006  . COUGH, CHRONIC 08/12/2006  . CEREBROVASCULAR ACCIDENT, HX OF 08/12/2006  . MIGRAINES, HX OF 08/12/2006   Allergies  Allergen Reactions  . Aspirin-Dipyridamole Er Other (See Comments)    REACTION: Headache AGGRENOX   . Rosuvastatin Other (See Comments)    REACTION: Not effective   Current Meds  Medication Sig  . acetaminophen (TYLENOL) 500 MG tablet Take 1,000 mg by mouth every 6 (six) hours as needed for mild pain.   Marland Kitchen albuterol (PROVENTIL HFA;VENTOLIN HFA) 108 (90 BASE) MCG/ACT inhaler Inhale 2 puffs into the lungs every 6 (six) hours as needed for wheezing or shortness of breath.   Marland Kitchen apixaban (ELIQUIS) 5 MG TABS tablet Take 5 mg by mouth 2 (two) times daily.   Marland Kitchen atorvastatin (LIPITOR) 80 MG tablet Take 80 mg by mouth at bedtime.   . Azelastine HCl 0.15 % SOLN Place 1 spray into both nostrils 2 (two) times daily. Use in each  nostril as directed   . benzonatate (TESSALON) 100 MG capsule Take 100 mg by mouth 3 (three) times daily as needed for cough.   . calcitRIOL (ROCALTROL) 0.25 MCG capsule Take 0.25-0.5 mcg by mouth See admin instructions. Take 0.5 mcg by mouth every morning and 0.25 mcg every evening.  . calcium carbonate (TUMS E-X 750) 750 MG chewable tablet Chew 1 tablet by mouth at bedtime.   .  Calcium Carbonate Antacid (TUMS ULTRA 1000 PO) Take 1,000 mg by mouth daily.  . Fluticasone-Umeclidin-Vilant (TRELEGY ELLIPTA) 200-62.5-25 MCG/INH AEPB Inhale 1 puff into the lungs every morning.   . gabapentin (NEURONTIN) 100 MG capsule Take 100 mg by mouth every morning.   Marland Kitchen icosapent Ethyl (VASCEPA) 1 g capsule   . iron polysaccharides (NIFEREX) 150 MG capsule Take 150 mg by mouth daily.   . isosorbide mononitrate (IMDUR) 30 MG 24 hr tablet Take 30 mg by mouth every morning.   Marland Kitchen levothyroxine (SYNTHROID, LEVOTHROID) 137 MCG tablet Take 137 mcg by mouth daily before breakfast.   . losartan (COZAAR) 50 MG tablet Take 50 mg by mouth every morning.   . montelukast (SINGULAIR) 10 MG tablet Take 10 mg by mouth at bedtime.  . Multiple Vitamin (MULTIVITAMIN) tablet Take 1 tablet by mouth daily.  . niacin 500 MG tablet Take 500 mg by mouth daily.  . nitroGLYCERIN (NITROSTAT) 0.4 MG SL tablet Place 0.4 mg under the tongue every 5 (five) minutes as needed for chest pain.   . potassium chloride SA (K-DUR,KLOR-CON) 20 MEQ tablet Take 20 mEq by mouth daily.   Marland Kitchen Propylene Glycol (SYSTANE BALANCE) 0.6 % SOLN Place 1 drop into both eyes at bedtime.  . sotalol (BETAPACE) 80 MG tablet Take 80 mg by mouth every morning.   Marland Kitchen Spacer/Aero-Holding Chambers (AEROCHAMBER MV) inhaler Use as instructed  . sucralfate (CARAFATE) 1 g tablet Take 1 g by mouth 2 (two) times daily.  . vitamin C (ASCORBIC ACID) 500 MG tablet Take 500 mg by mouth daily.   Immunization History  Administered Date(s) Administered  . Influenza Inj Mdck Quad Pf 02/24/2018  . Influenza,inj,Quad PF,6+ Mos 03/16/2020  . Influenza-Unspecified 02/23/2015, 02/03/2016, 02/25/2017, 03/09/2019  . PFIZER SARS-COV-2 Vaccination 08/18/2019, 09/08/2019  . Pneumococcal Conjugate-13 04/22/2019  . Pneumococcal Polysaccharide-23 01/16/2016  . Td 05/21/1997, 12/23/2009       Objective:   Physical Exam BP 124/80 (BP Location: Left Arm, Cuff Size: Normal)    Pulse 80   Temp (!) 97.1 F (36.2 C) (Temporal)   Ht 5\' 3"  (1.6 m)   Wt 156 lb 9.6 oz (71 kg)   SpO2 97%   BMI 27.74 kg/m  GENERAL:Overweight woman, no acute distress. She is hard of hearing wears hearing aids.She is fully ambulatory.  HEAD: Normocephalic, atraumatic.  EYES: Pupils equal, round, reactive to light. No scleral icterus.  MOUTH:Nose/mouth/throat not examined due to masking requirements for COVID 19. NECK: Supple. No thyromegaly. Trachea midline. No JVD. No adenopathy. PULMONARY: Good air entry bilaterally.Lungs are clear today no adventitious sounds.  CARDIOVASCULAR: S1 and S2. Regular rate and rhythm. A 1/6 systolic ejection murmur left sternal border.  No rubs or gallops. ABDOMEN:Benign. MUSCULOSKELETAL: No joint deformity, no clubbing, no edema.  NEUROLOGIC:No overt focal deficit. Mild speech impediment due to [facility]. Wears bilateral hearing aids. SKIN: Intact,warm,dry. PSYCH:Mood and behavior are normal.  Ambulatory oximetry: Ambulated 750 feet with moderate symptom of dyspnea, resting O2 sat 95% oximetry never deviated from this.     Assessment & Plan:  ICD-10-CM   1. Persistent asthma without complication, mild to moderate  J45.909    Continue Trelegy Ellipta Patient has been released back to the care of Dr. Raul Del her primary pulmonologist  2. Lung nodules - multiple  R91.8    Difficult to biopsy due to groundglass character Nondiagnostic bronchoscopies x2 for cancer Bronchoscopic sampling consistent with inflammation  3. Tracheitis  J04.10    Doxycycline 100 mg twice daily x7 days Call if no better after antibiotic Rx  4. Dyspnea on exertion  R06.00    Ambulatory oximetry today fails to show oxygen desaturation Continue conditioning program  5. OSA on CPAP  G47.33    Z99.89    Continue management per Dr. Raul Del Compliant with device    Meds ordered this encounter  Medications  . doxycycline (VIBRA-TABS) 100 MG tablet    Sig:  Take 1 tablet (100 mg total) by mouth 2 (two) times daily.    Dispense:  14 tablet    Refill:  0    Discussion:  Patient has been referred strictly for advanced bronchoscopic procedure.  She has undergone this and has been nondiagnostic.  We will discuss with Dr. Rogue Bussing as to further in this regard.  She has likely very mild tracheitis after her most recent intervention.  We will treat with doxycycline x7 days.  She is to contact us if this is not helpful.  Patient will be released back to the care of Dr. Raul Del her primary pulmonologist.  Happy to reassess as necessary for advanced bronchoscopic procedures.  Renold Don, MD [place] [facility]  *This note was dictated using voice recognition software/Dragon.  Despite best efforts to proofread, errors can occur which can change the meaning.  Any change was purely unintentional.

## 2020-04-11 ENCOUNTER — Telehealth: Payer: Self-pay | Admitting: Internal Medicine

## 2020-04-11 NOTE — Telephone Encounter (Signed)
I did see her Friday and discussed all of this.

## 2020-04-11 NOTE — Telephone Encounter (Signed)
On 11/19-discussed with Dr. Patsey Berthold the technical difficulty in unable to prove patient's malignancy.   Left a voicemail for the patient discussing the above.  Recommend continued follow-up with Dr. Patsey Berthold closely.   Hayley-please check with patient if she has any questions regarding her  Care.   GB

## 2020-04-12 DIAGNOSIS — I10 Essential (primary) hypertension: Secondary | ICD-10-CM | POA: Diagnosis not present

## 2020-04-12 DIAGNOSIS — E785 Hyperlipidemia, unspecified: Secondary | ICD-10-CM | POA: Diagnosis not present

## 2020-04-12 DIAGNOSIS — I4891 Unspecified atrial fibrillation: Secondary | ICD-10-CM | POA: Diagnosis not present

## 2020-04-12 DIAGNOSIS — E781 Pure hyperglyceridemia: Secondary | ICD-10-CM | POA: Diagnosis not present

## 2020-04-14 DIAGNOSIS — G4733 Obstructive sleep apnea (adult) (pediatric): Secondary | ICD-10-CM | POA: Diagnosis not present

## 2020-04-18 DIAGNOSIS — E892 Postprocedural hypoparathyroidism: Secondary | ICD-10-CM | POA: Diagnosis not present

## 2020-04-18 DIAGNOSIS — E890 Postprocedural hypothyroidism: Secondary | ICD-10-CM | POA: Diagnosis not present

## 2020-04-18 DIAGNOSIS — Z85850 Personal history of malignant neoplasm of thyroid: Secondary | ICD-10-CM | POA: Diagnosis not present

## 2020-04-21 ENCOUNTER — Ambulatory Visit: Payer: Medicare HMO | Admitting: Cardiovascular Disease

## 2020-04-21 DIAGNOSIS — R918 Other nonspecific abnormal finding of lung field: Secondary | ICD-10-CM | POA: Diagnosis not present

## 2020-04-21 DIAGNOSIS — R0602 Shortness of breath: Secondary | ICD-10-CM | POA: Diagnosis not present

## 2020-04-21 DIAGNOSIS — Z01818 Encounter for other preprocedural examination: Secondary | ICD-10-CM | POA: Diagnosis not present

## 2020-04-21 DIAGNOSIS — G4733 Obstructive sleep apnea (adult) (pediatric): Secondary | ICD-10-CM | POA: Diagnosis not present

## 2020-04-22 DIAGNOSIS — D225 Melanocytic nevi of trunk: Secondary | ICD-10-CM | POA: Diagnosis not present

## 2020-04-22 DIAGNOSIS — D2261 Melanocytic nevi of right upper limb, including shoulder: Secondary | ICD-10-CM | POA: Diagnosis not present

## 2020-04-22 DIAGNOSIS — L538 Other specified erythematous conditions: Secondary | ICD-10-CM | POA: Diagnosis not present

## 2020-04-22 DIAGNOSIS — L820 Inflamed seborrheic keratosis: Secondary | ICD-10-CM | POA: Diagnosis not present

## 2020-04-22 DIAGNOSIS — D2272 Melanocytic nevi of left lower limb, including hip: Secondary | ICD-10-CM | POA: Diagnosis not present

## 2020-04-22 DIAGNOSIS — D2271 Melanocytic nevi of right lower limb, including hip: Secondary | ICD-10-CM | POA: Diagnosis not present

## 2020-04-22 DIAGNOSIS — L218 Other seborrheic dermatitis: Secondary | ICD-10-CM | POA: Diagnosis not present

## 2020-04-22 DIAGNOSIS — D2262 Melanocytic nevi of left upper limb, including shoulder: Secondary | ICD-10-CM | POA: Diagnosis not present

## 2020-04-22 DIAGNOSIS — L821 Other seborrheic keratosis: Secondary | ICD-10-CM | POA: Diagnosis not present

## 2020-04-25 DIAGNOSIS — I10 Essential (primary) hypertension: Secondary | ICD-10-CM | POA: Diagnosis not present

## 2020-04-25 DIAGNOSIS — E785 Hyperlipidemia, unspecified: Secondary | ICD-10-CM | POA: Diagnosis not present

## 2020-04-25 DIAGNOSIS — I209 Angina pectoris, unspecified: Secondary | ICD-10-CM | POA: Diagnosis not present

## 2020-04-25 DIAGNOSIS — I480 Paroxysmal atrial fibrillation: Secondary | ICD-10-CM | POA: Diagnosis not present

## 2020-04-30 ENCOUNTER — Other Ambulatory Visit: Payer: Self-pay | Admitting: Pulmonary Disease

## 2020-05-05 DIAGNOSIS — I209 Angina pectoris, unspecified: Secondary | ICD-10-CM | POA: Diagnosis not present

## 2020-05-10 DIAGNOSIS — E119 Type 2 diabetes mellitus without complications: Secondary | ICD-10-CM | POA: Diagnosis not present

## 2020-05-10 DIAGNOSIS — E781 Pure hyperglyceridemia: Secondary | ICD-10-CM | POA: Diagnosis not present

## 2020-05-10 DIAGNOSIS — E785 Hyperlipidemia, unspecified: Secondary | ICD-10-CM | POA: Diagnosis not present

## 2020-05-10 DIAGNOSIS — I5030 Unspecified diastolic (congestive) heart failure: Secondary | ICD-10-CM | POA: Diagnosis not present

## 2020-05-10 DIAGNOSIS — I110 Hypertensive heart disease with heart failure: Secondary | ICD-10-CM | POA: Diagnosis not present

## 2020-05-10 DIAGNOSIS — I4891 Unspecified atrial fibrillation: Secondary | ICD-10-CM | POA: Diagnosis not present

## 2020-05-10 DIAGNOSIS — I10 Essential (primary) hypertension: Secondary | ICD-10-CM | POA: Diagnosis not present

## 2020-05-10 DIAGNOSIS — J4530 Mild persistent asthma, uncomplicated: Secondary | ICD-10-CM | POA: Diagnosis not present

## 2020-05-12 DIAGNOSIS — H35371 Puckering of macula, right eye: Secondary | ICD-10-CM | POA: Diagnosis not present

## 2020-06-16 DIAGNOSIS — G4733 Obstructive sleep apnea (adult) (pediatric): Secondary | ICD-10-CM | POA: Diagnosis not present

## 2020-07-11 DIAGNOSIS — I480 Paroxysmal atrial fibrillation: Secondary | ICD-10-CM | POA: Diagnosis not present

## 2020-07-11 DIAGNOSIS — G935 Compression of brain: Secondary | ICD-10-CM | POA: Diagnosis not present

## 2020-07-11 DIAGNOSIS — I10 Essential (primary) hypertension: Secondary | ICD-10-CM | POA: Diagnosis not present

## 2020-07-11 DIAGNOSIS — Z0000 Encounter for general adult medical examination without abnormal findings: Secondary | ICD-10-CM | POA: Diagnosis not present

## 2020-07-11 DIAGNOSIS — D649 Anemia, unspecified: Secondary | ICD-10-CM | POA: Diagnosis not present

## 2020-07-11 DIAGNOSIS — Z79899 Other long term (current) drug therapy: Secondary | ICD-10-CM | POA: Diagnosis not present

## 2020-07-11 DIAGNOSIS — R0602 Shortness of breath: Secondary | ICD-10-CM | POA: Diagnosis not present

## 2020-07-11 DIAGNOSIS — Z85850 Personal history of malignant neoplasm of thyroid: Secondary | ICD-10-CM | POA: Diagnosis not present

## 2020-07-11 DIAGNOSIS — G4733 Obstructive sleep apnea (adult) (pediatric): Secondary | ICD-10-CM | POA: Diagnosis not present

## 2020-07-18 DIAGNOSIS — D509 Iron deficiency anemia, unspecified: Secondary | ICD-10-CM | POA: Diagnosis not present

## 2020-07-18 DIAGNOSIS — J449 Chronic obstructive pulmonary disease, unspecified: Secondary | ICD-10-CM | POA: Diagnosis not present

## 2020-07-18 DIAGNOSIS — G935 Compression of brain: Secondary | ICD-10-CM | POA: Diagnosis not present

## 2020-07-18 DIAGNOSIS — N289 Disorder of kidney and ureter, unspecified: Secondary | ICD-10-CM | POA: Diagnosis not present

## 2020-07-18 DIAGNOSIS — I480 Paroxysmal atrial fibrillation: Secondary | ICD-10-CM | POA: Diagnosis not present

## 2020-07-18 DIAGNOSIS — E892 Postprocedural hypoparathyroidism: Secondary | ICD-10-CM | POA: Diagnosis not present

## 2020-07-18 DIAGNOSIS — E890 Postprocedural hypothyroidism: Secondary | ICD-10-CM | POA: Diagnosis not present

## 2020-07-18 DIAGNOSIS — Z0000 Encounter for general adult medical examination without abnormal findings: Secondary | ICD-10-CM | POA: Diagnosis not present

## 2020-07-18 DIAGNOSIS — I10 Essential (primary) hypertension: Secondary | ICD-10-CM | POA: Diagnosis not present

## 2020-07-20 DIAGNOSIS — E890 Postprocedural hypothyroidism: Secondary | ICD-10-CM | POA: Diagnosis not present

## 2020-07-20 DIAGNOSIS — Z85850 Personal history of malignant neoplasm of thyroid: Secondary | ICD-10-CM | POA: Diagnosis not present

## 2020-07-20 DIAGNOSIS — N1832 Chronic kidney disease, stage 3b: Secondary | ICD-10-CM | POA: Diagnosis not present

## 2020-07-20 DIAGNOSIS — E892 Postprocedural hypoparathyroidism: Secondary | ICD-10-CM | POA: Diagnosis not present

## 2020-07-22 DIAGNOSIS — I10 Essential (primary) hypertension: Secondary | ICD-10-CM | POA: Diagnosis not present

## 2020-07-22 DIAGNOSIS — I340 Nonrheumatic mitral (valve) insufficiency: Secondary | ICD-10-CM | POA: Diagnosis not present

## 2020-07-22 DIAGNOSIS — I4891 Unspecified atrial fibrillation: Secondary | ICD-10-CM | POA: Diagnosis not present

## 2020-07-22 DIAGNOSIS — R002 Palpitations: Secondary | ICD-10-CM | POA: Diagnosis not present

## 2020-07-22 DIAGNOSIS — E782 Mixed hyperlipidemia: Secondary | ICD-10-CM | POA: Diagnosis not present

## 2020-08-16 DIAGNOSIS — Z9181 History of falling: Secondary | ICD-10-CM | POA: Diagnosis not present

## 2020-08-16 DIAGNOSIS — R413 Other amnesia: Secondary | ICD-10-CM | POA: Diagnosis not present

## 2020-08-16 DIAGNOSIS — G479 Sleep disorder, unspecified: Secondary | ICD-10-CM | POA: Diagnosis not present

## 2020-09-14 DIAGNOSIS — G4733 Obstructive sleep apnea (adult) (pediatric): Secondary | ICD-10-CM | POA: Diagnosis not present

## 2020-09-19 ENCOUNTER — Other Ambulatory Visit (HOSPITAL_COMMUNITY): Payer: Self-pay | Admitting: Specialist

## 2020-09-19 ENCOUNTER — Other Ambulatory Visit: Payer: Self-pay | Admitting: Specialist

## 2020-09-19 DIAGNOSIS — R911 Solitary pulmonary nodule: Secondary | ICD-10-CM

## 2020-09-19 DIAGNOSIS — R918 Other nonspecific abnormal finding of lung field: Secondary | ICD-10-CM | POA: Diagnosis not present

## 2020-09-29 ENCOUNTER — Other Ambulatory Visit: Payer: Self-pay

## 2020-09-29 ENCOUNTER — Ambulatory Visit
Admission: RE | Admit: 2020-09-29 | Discharge: 2020-09-29 | Disposition: A | Discharge status: Home / Self Care | Payer: Medicare HMO | Source: Ambulatory Visit | Attending: Specialist | Admitting: Specialist

## 2020-09-29 DIAGNOSIS — R0602 Shortness of breath: Secondary | ICD-10-CM | POA: Diagnosis not present

## 2020-09-29 DIAGNOSIS — R911 Solitary pulmonary nodule: Secondary | ICD-10-CM | POA: Insufficient documentation

## 2020-09-29 DIAGNOSIS — I700 Atherosclerosis of aorta: Secondary | ICD-10-CM | POA: Diagnosis not present

## 2020-09-29 DIAGNOSIS — I2510 Atherosclerotic heart disease of native coronary artery without angina pectoris: Secondary | ICD-10-CM | POA: Diagnosis not present

## 2020-09-30 DIAGNOSIS — I9589 Other hypotension: Secondary | ICD-10-CM | POA: Diagnosis not present

## 2020-09-30 DIAGNOSIS — D509 Iron deficiency anemia, unspecified: Secondary | ICD-10-CM | POA: Diagnosis not present

## 2020-09-30 DIAGNOSIS — Z79899 Other long term (current) drug therapy: Secondary | ICD-10-CM | POA: Diagnosis not present

## 2020-09-30 DIAGNOSIS — G935 Compression of brain: Secondary | ICD-10-CM | POA: Diagnosis not present

## 2020-09-30 DIAGNOSIS — I480 Paroxysmal atrial fibrillation: Secondary | ICD-10-CM | POA: Diagnosis not present

## 2020-09-30 DIAGNOSIS — J449 Chronic obstructive pulmonary disease, unspecified: Secondary | ICD-10-CM | POA: Diagnosis not present

## 2020-10-05 DIAGNOSIS — J849 Interstitial pulmonary disease, unspecified: Secondary | ICD-10-CM | POA: Diagnosis not present

## 2020-10-05 DIAGNOSIS — R0600 Dyspnea, unspecified: Secondary | ICD-10-CM | POA: Diagnosis not present

## 2020-10-05 DIAGNOSIS — R918 Other nonspecific abnormal finding of lung field: Secondary | ICD-10-CM | POA: Diagnosis not present

## 2020-10-11 DIAGNOSIS — E782 Mixed hyperlipidemia: Secondary | ICD-10-CM | POA: Diagnosis not present

## 2020-10-11 DIAGNOSIS — I9589 Other hypotension: Secondary | ICD-10-CM | POA: Diagnosis not present

## 2020-10-11 DIAGNOSIS — G935 Compression of brain: Secondary | ICD-10-CM | POA: Diagnosis not present

## 2020-10-11 DIAGNOSIS — E890 Postprocedural hypothyroidism: Secondary | ICD-10-CM | POA: Diagnosis not present

## 2020-10-11 DIAGNOSIS — R42 Dizziness and giddiness: Secondary | ICD-10-CM | POA: Diagnosis not present

## 2020-10-11 DIAGNOSIS — R911 Solitary pulmonary nodule: Secondary | ICD-10-CM | POA: Diagnosis not present

## 2020-10-11 DIAGNOSIS — E209 Hypoparathyroidism, unspecified: Secondary | ICD-10-CM | POA: Diagnosis not present

## 2020-10-11 DIAGNOSIS — I480 Paroxysmal atrial fibrillation: Secondary | ICD-10-CM | POA: Diagnosis not present

## 2020-10-11 DIAGNOSIS — D649 Anemia, unspecified: Secondary | ICD-10-CM | POA: Diagnosis not present

## 2020-10-17 DIAGNOSIS — Z20822 Contact with and (suspected) exposure to covid-19: Secondary | ICD-10-CM | POA: Diagnosis not present

## 2020-10-17 DIAGNOSIS — Z03818 Encounter for observation for suspected exposure to other biological agents ruled out: Secondary | ICD-10-CM | POA: Diagnosis not present

## 2020-10-17 DIAGNOSIS — J019 Acute sinusitis, unspecified: Secondary | ICD-10-CM | POA: Diagnosis not present

## 2020-10-20 DIAGNOSIS — I6523 Occlusion and stenosis of bilateral carotid arteries: Secondary | ICD-10-CM | POA: Diagnosis not present

## 2020-10-20 DIAGNOSIS — R42 Dizziness and giddiness: Secondary | ICD-10-CM | POA: Diagnosis not present

## 2020-11-04 DIAGNOSIS — I2510 Atherosclerotic heart disease of native coronary artery without angina pectoris: Secondary | ICD-10-CM | POA: Diagnosis not present

## 2020-11-04 DIAGNOSIS — E785 Hyperlipidemia, unspecified: Secondary | ICD-10-CM | POA: Diagnosis not present

## 2020-11-04 DIAGNOSIS — J45998 Other asthma: Secondary | ICD-10-CM | POA: Diagnosis not present

## 2020-11-04 DIAGNOSIS — R55 Syncope and collapse: Secondary | ICD-10-CM | POA: Diagnosis not present

## 2020-11-04 DIAGNOSIS — I10 Essential (primary) hypertension: Secondary | ICD-10-CM | POA: Diagnosis not present

## 2020-11-04 DIAGNOSIS — I4891 Unspecified atrial fibrillation: Secondary | ICD-10-CM | POA: Diagnosis not present

## 2020-11-08 DIAGNOSIS — I10 Essential (primary) hypertension: Secondary | ICD-10-CM | POA: Diagnosis not present

## 2020-11-08 DIAGNOSIS — R413 Other amnesia: Secondary | ICD-10-CM | POA: Diagnosis not present

## 2020-11-08 DIAGNOSIS — I480 Paroxysmal atrial fibrillation: Secondary | ICD-10-CM | POA: Diagnosis not present

## 2020-11-08 DIAGNOSIS — Z79899 Other long term (current) drug therapy: Secondary | ICD-10-CM | POA: Diagnosis not present

## 2020-11-08 DIAGNOSIS — R918 Other nonspecific abnormal finding of lung field: Secondary | ICD-10-CM | POA: Diagnosis not present

## 2020-11-08 DIAGNOSIS — D649 Anemia, unspecified: Secondary | ICD-10-CM | POA: Diagnosis not present

## 2020-11-08 DIAGNOSIS — E782 Mixed hyperlipidemia: Secondary | ICD-10-CM | POA: Diagnosis not present

## 2020-11-08 DIAGNOSIS — E890 Postprocedural hypothyroidism: Secondary | ICD-10-CM | POA: Diagnosis not present

## 2020-11-10 DIAGNOSIS — R55 Syncope and collapse: Secondary | ICD-10-CM | POA: Diagnosis not present

## 2020-11-15 DIAGNOSIS — N289 Disorder of kidney and ureter, unspecified: Secondary | ICD-10-CM | POA: Diagnosis not present

## 2020-11-15 DIAGNOSIS — J449 Chronic obstructive pulmonary disease, unspecified: Secondary | ICD-10-CM | POA: Diagnosis not present

## 2020-11-15 DIAGNOSIS — Z79899 Other long term (current) drug therapy: Secondary | ICD-10-CM | POA: Diagnosis not present

## 2020-11-15 DIAGNOSIS — R296 Repeated falls: Secondary | ICD-10-CM | POA: Diagnosis not present

## 2020-11-15 DIAGNOSIS — G4733 Obstructive sleep apnea (adult) (pediatric): Secondary | ICD-10-CM | POA: Diagnosis not present

## 2020-11-15 DIAGNOSIS — E890 Postprocedural hypothyroidism: Secondary | ICD-10-CM | POA: Diagnosis not present

## 2020-11-15 DIAGNOSIS — R918 Other nonspecific abnormal finding of lung field: Secondary | ICD-10-CM | POA: Diagnosis not present

## 2020-11-15 DIAGNOSIS — I480 Paroxysmal atrial fibrillation: Secondary | ICD-10-CM | POA: Diagnosis not present

## 2020-11-15 DIAGNOSIS — D509 Iron deficiency anemia, unspecified: Secondary | ICD-10-CM | POA: Diagnosis not present

## 2020-11-17 DIAGNOSIS — E785 Hyperlipidemia, unspecified: Secondary | ICD-10-CM | POA: Diagnosis not present

## 2020-11-17 DIAGNOSIS — I4891 Unspecified atrial fibrillation: Secondary | ICD-10-CM | POA: Diagnosis not present

## 2020-11-17 DIAGNOSIS — I2510 Atherosclerotic heart disease of native coronary artery without angina pectoris: Secondary | ICD-10-CM | POA: Diagnosis not present

## 2020-11-17 DIAGNOSIS — I10 Essential (primary) hypertension: Secondary | ICD-10-CM | POA: Diagnosis not present

## 2020-11-17 DIAGNOSIS — E119 Type 2 diabetes mellitus without complications: Secondary | ICD-10-CM | POA: Diagnosis not present

## 2020-11-17 DIAGNOSIS — I340 Nonrheumatic mitral (valve) insufficiency: Secondary | ICD-10-CM | POA: Diagnosis not present

## 2020-11-17 DIAGNOSIS — R0602 Shortness of breath: Secondary | ICD-10-CM | POA: Diagnosis not present

## 2020-12-01 ENCOUNTER — Other Ambulatory Visit: Payer: Self-pay | Admitting: Nephrology

## 2020-12-01 ENCOUNTER — Other Ambulatory Visit (HOSPITAL_COMMUNITY): Payer: Self-pay | Admitting: Nephrology

## 2020-12-01 DIAGNOSIS — R809 Proteinuria, unspecified: Secondary | ICD-10-CM | POA: Diagnosis not present

## 2020-12-01 DIAGNOSIS — I10 Essential (primary) hypertension: Secondary | ICD-10-CM | POA: Insufficient documentation

## 2020-12-01 DIAGNOSIS — E785 Hyperlipidemia, unspecified: Secondary | ICD-10-CM | POA: Diagnosis not present

## 2020-12-01 DIAGNOSIS — I639 Cerebral infarction, unspecified: Secondary | ICD-10-CM | POA: Diagnosis not present

## 2020-12-01 DIAGNOSIS — N1832 Chronic kidney disease, stage 3b: Secondary | ICD-10-CM

## 2020-12-01 DIAGNOSIS — D631 Anemia in chronic kidney disease: Secondary | ICD-10-CM | POA: Diagnosis not present

## 2020-12-01 DIAGNOSIS — G4733 Obstructive sleep apnea (adult) (pediatric): Secondary | ICD-10-CM | POA: Diagnosis not present

## 2020-12-01 DIAGNOSIS — R8290 Unspecified abnormal findings in urine: Secondary | ICD-10-CM | POA: Diagnosis not present

## 2020-12-06 DIAGNOSIS — H903 Sensorineural hearing loss, bilateral: Secondary | ICD-10-CM | POA: Diagnosis not present

## 2020-12-12 DIAGNOSIS — H903 Sensorineural hearing loss, bilateral: Secondary | ICD-10-CM | POA: Diagnosis not present

## 2020-12-13 DIAGNOSIS — G4733 Obstructive sleep apnea (adult) (pediatric): Secondary | ICD-10-CM | POA: Diagnosis not present

## 2020-12-20 ENCOUNTER — Ambulatory Visit
Admission: RE | Admit: 2020-12-20 | Discharge: 2020-12-20 | Disposition: A | Discharge status: Home / Self Care | Payer: Medicare HMO | Source: Ambulatory Visit | Attending: Nephrology | Admitting: Nephrology

## 2020-12-20 ENCOUNTER — Other Ambulatory Visit: Payer: Self-pay

## 2020-12-20 DIAGNOSIS — N1832 Chronic kidney disease, stage 3b: Secondary | ICD-10-CM | POA: Diagnosis not present

## 2020-12-20 DIAGNOSIS — N2889 Other specified disorders of kidney and ureter: Secondary | ICD-10-CM | POA: Diagnosis not present

## 2021-01-25 DIAGNOSIS — E890 Postprocedural hypothyroidism: Secondary | ICD-10-CM | POA: Diagnosis not present

## 2021-01-25 DIAGNOSIS — E892 Postprocedural hypoparathyroidism: Secondary | ICD-10-CM | POA: Diagnosis not present

## 2021-01-25 DIAGNOSIS — Z85850 Personal history of malignant neoplasm of thyroid: Secondary | ICD-10-CM | POA: Diagnosis not present

## 2021-01-25 DIAGNOSIS — N1832 Chronic kidney disease, stage 3b: Secondary | ICD-10-CM | POA: Diagnosis not present

## 2021-01-26 ENCOUNTER — Other Ambulatory Visit (HOSPITAL_COMMUNITY): Payer: Self-pay | Admitting: Specialist

## 2021-01-26 ENCOUNTER — Other Ambulatory Visit: Payer: Self-pay | Admitting: Specialist

## 2021-01-26 DIAGNOSIS — J849 Interstitial pulmonary disease, unspecified: Secondary | ICD-10-CM

## 2021-01-26 DIAGNOSIS — R0600 Dyspnea, unspecified: Secondary | ICD-10-CM

## 2021-01-26 DIAGNOSIS — G4733 Obstructive sleep apnea (adult) (pediatric): Secondary | ICD-10-CM | POA: Diagnosis not present

## 2021-01-26 DIAGNOSIS — R0609 Other forms of dyspnea: Secondary | ICD-10-CM

## 2021-01-26 DIAGNOSIS — R918 Other nonspecific abnormal finding of lung field: Secondary | ICD-10-CM | POA: Diagnosis not present

## 2021-01-26 DIAGNOSIS — J4530 Mild persistent asthma, uncomplicated: Secondary | ICD-10-CM | POA: Diagnosis not present

## 2021-02-01 DIAGNOSIS — E890 Postprocedural hypothyroidism: Secondary | ICD-10-CM | POA: Diagnosis not present

## 2021-02-01 DIAGNOSIS — Z85850 Personal history of malignant neoplasm of thyroid: Secondary | ICD-10-CM | POA: Diagnosis not present

## 2021-02-01 DIAGNOSIS — E892 Postprocedural hypoparathyroidism: Secondary | ICD-10-CM | POA: Diagnosis not present

## 2021-02-02 DIAGNOSIS — D631 Anemia in chronic kidney disease: Secondary | ICD-10-CM | POA: Diagnosis not present

## 2021-02-02 DIAGNOSIS — R8290 Unspecified abnormal findings in urine: Secondary | ICD-10-CM | POA: Diagnosis not present

## 2021-02-02 DIAGNOSIS — R809 Proteinuria, unspecified: Secondary | ICD-10-CM | POA: Diagnosis not present

## 2021-02-02 DIAGNOSIS — N1832 Chronic kidney disease, stage 3b: Secondary | ICD-10-CM | POA: Diagnosis not present

## 2021-02-02 DIAGNOSIS — I10 Essential (primary) hypertension: Secondary | ICD-10-CM | POA: Diagnosis not present

## 2021-02-02 DIAGNOSIS — I639 Cerebral infarction, unspecified: Secondary | ICD-10-CM | POA: Diagnosis not present

## 2021-02-02 DIAGNOSIS — G4733 Obstructive sleep apnea (adult) (pediatric): Secondary | ICD-10-CM | POA: Diagnosis not present

## 2021-02-02 DIAGNOSIS — E785 Hyperlipidemia, unspecified: Secondary | ICD-10-CM | POA: Diagnosis not present

## 2021-02-15 ENCOUNTER — Ambulatory Visit
Admission: RE | Admit: 2021-02-15 | Discharge: 2021-02-15 | Disposition: A | Discharge status: Home / Self Care | Payer: Medicare HMO | Source: Ambulatory Visit | Attending: Specialist | Admitting: Specialist

## 2021-02-15 ENCOUNTER — Other Ambulatory Visit: Payer: Self-pay

## 2021-02-15 DIAGNOSIS — R0600 Dyspnea, unspecified: Secondary | ICD-10-CM | POA: Diagnosis not present

## 2021-02-15 DIAGNOSIS — J849 Interstitial pulmonary disease, unspecified: Secondary | ICD-10-CM | POA: Diagnosis not present

## 2021-02-15 DIAGNOSIS — I700 Atherosclerosis of aorta: Secondary | ICD-10-CM | POA: Diagnosis not present

## 2021-02-15 DIAGNOSIS — R0602 Shortness of breath: Secondary | ICD-10-CM | POA: Diagnosis not present

## 2021-02-15 DIAGNOSIS — R0609 Other forms of dyspnea: Secondary | ICD-10-CM

## 2021-02-15 DIAGNOSIS — J439 Emphysema, unspecified: Secondary | ICD-10-CM | POA: Diagnosis not present

## 2021-02-15 DIAGNOSIS — R918 Other nonspecific abnormal finding of lung field: Secondary | ICD-10-CM | POA: Diagnosis not present

## 2021-02-15 DIAGNOSIS — R911 Solitary pulmonary nodule: Secondary | ICD-10-CM | POA: Diagnosis not present

## 2021-02-16 DIAGNOSIS — G4733 Obstructive sleep apnea (adult) (pediatric): Secondary | ICD-10-CM | POA: Diagnosis not present

## 2021-02-16 DIAGNOSIS — R350 Frequency of micturition: Secondary | ICD-10-CM | POA: Diagnosis not present

## 2021-02-16 DIAGNOSIS — G479 Sleep disorder, unspecified: Secondary | ICD-10-CM | POA: Diagnosis not present

## 2021-02-16 DIAGNOSIS — R413 Other amnesia: Secondary | ICD-10-CM | POA: Diagnosis not present

## 2021-02-16 DIAGNOSIS — R3915 Urgency of urination: Secondary | ICD-10-CM | POA: Diagnosis not present

## 2021-02-16 DIAGNOSIS — R309 Painful micturition, unspecified: Secondary | ICD-10-CM | POA: Diagnosis not present

## 2021-02-16 DIAGNOSIS — R2689 Other abnormalities of gait and mobility: Secondary | ICD-10-CM | POA: Diagnosis not present

## 2021-02-23 ENCOUNTER — Other Ambulatory Visit (HOSPITAL_BASED_OUTPATIENT_CLINIC_OR_DEPARTMENT_OTHER): Payer: Self-pay | Admitting: Specialist

## 2021-02-23 ENCOUNTER — Other Ambulatory Visit: Payer: Self-pay | Admitting: Specialist

## 2021-02-23 DIAGNOSIS — G4733 Obstructive sleep apnea (adult) (pediatric): Secondary | ICD-10-CM | POA: Diagnosis not present

## 2021-02-23 DIAGNOSIS — J4520 Mild intermittent asthma, uncomplicated: Secondary | ICD-10-CM | POA: Diagnosis not present

## 2021-02-23 DIAGNOSIS — R918 Other nonspecific abnormal finding of lung field: Secondary | ICD-10-CM | POA: Diagnosis not present

## 2021-02-24 DIAGNOSIS — I2510 Atherosclerotic heart disease of native coronary artery without angina pectoris: Secondary | ICD-10-CM | POA: Diagnosis not present

## 2021-02-24 DIAGNOSIS — E785 Hyperlipidemia, unspecified: Secondary | ICD-10-CM | POA: Diagnosis not present

## 2021-02-24 DIAGNOSIS — I4891 Unspecified atrial fibrillation: Secondary | ICD-10-CM | POA: Diagnosis not present

## 2021-02-24 DIAGNOSIS — J45998 Other asthma: Secondary | ICD-10-CM | POA: Diagnosis not present

## 2021-02-24 DIAGNOSIS — I10 Essential (primary) hypertension: Secondary | ICD-10-CM | POA: Diagnosis not present

## 2021-02-28 DIAGNOSIS — J4520 Mild intermittent asthma, uncomplicated: Secondary | ICD-10-CM | POA: Diagnosis not present

## 2021-03-16 DIAGNOSIS — G4733 Obstructive sleep apnea (adult) (pediatric): Secondary | ICD-10-CM | POA: Diagnosis not present

## 2021-03-21 DIAGNOSIS — E782 Mixed hyperlipidemia: Secondary | ICD-10-CM | POA: Diagnosis not present

## 2021-03-21 DIAGNOSIS — E890 Postprocedural hypothyroidism: Secondary | ICD-10-CM | POA: Diagnosis not present

## 2021-03-21 DIAGNOSIS — G4733 Obstructive sleep apnea (adult) (pediatric): Secondary | ICD-10-CM | POA: Diagnosis not present

## 2021-03-21 DIAGNOSIS — R918 Other nonspecific abnormal finding of lung field: Secondary | ICD-10-CM | POA: Diagnosis not present

## 2021-03-21 DIAGNOSIS — D649 Anemia, unspecified: Secondary | ICD-10-CM | POA: Diagnosis not present

## 2021-03-21 DIAGNOSIS — R296 Repeated falls: Secondary | ICD-10-CM | POA: Diagnosis not present

## 2021-03-21 DIAGNOSIS — R7309 Other abnormal glucose: Secondary | ICD-10-CM | POA: Diagnosis not present

## 2021-03-21 DIAGNOSIS — I10 Essential (primary) hypertension: Secondary | ICD-10-CM | POA: Diagnosis not present

## 2021-03-21 DIAGNOSIS — I480 Paroxysmal atrial fibrillation: Secondary | ICD-10-CM | POA: Diagnosis not present

## 2021-03-28 DIAGNOSIS — R911 Solitary pulmonary nodule: Secondary | ICD-10-CM | POA: Diagnosis not present

## 2021-03-28 DIAGNOSIS — G935 Compression of brain: Secondary | ICD-10-CM | POA: Diagnosis not present

## 2021-03-28 DIAGNOSIS — Z0000 Encounter for general adult medical examination without abnormal findings: Secondary | ICD-10-CM | POA: Diagnosis not present

## 2021-03-28 DIAGNOSIS — I700 Atherosclerosis of aorta: Secondary | ICD-10-CM | POA: Diagnosis not present

## 2021-03-28 DIAGNOSIS — J432 Centrilobular emphysema: Secondary | ICD-10-CM | POA: Diagnosis not present

## 2021-03-28 DIAGNOSIS — I129 Hypertensive chronic kidney disease with stage 1 through stage 4 chronic kidney disease, or unspecified chronic kidney disease: Secondary | ICD-10-CM | POA: Diagnosis not present

## 2021-03-28 DIAGNOSIS — N1832 Chronic kidney disease, stage 3b: Secondary | ICD-10-CM | POA: Diagnosis not present

## 2021-03-28 DIAGNOSIS — Z1231 Encounter for screening mammogram for malignant neoplasm of breast: Secondary | ICD-10-CM | POA: Diagnosis not present

## 2021-03-28 DIAGNOSIS — Z23 Encounter for immunization: Secondary | ICD-10-CM | POA: Diagnosis not present

## 2021-03-29 ENCOUNTER — Other Ambulatory Visit: Payer: Self-pay | Admitting: Internal Medicine

## 2021-03-29 DIAGNOSIS — Z1231 Encounter for screening mammogram for malignant neoplasm of breast: Secondary | ICD-10-CM

## 2021-03-31 DIAGNOSIS — J4520 Mild intermittent asthma, uncomplicated: Secondary | ICD-10-CM | POA: Diagnosis not present

## 2021-04-21 DIAGNOSIS — L821 Other seborrheic keratosis: Secondary | ICD-10-CM | POA: Diagnosis not present

## 2021-04-21 DIAGNOSIS — L218 Other seborrheic dermatitis: Secondary | ICD-10-CM | POA: Diagnosis not present

## 2021-04-21 DIAGNOSIS — D2271 Melanocytic nevi of right lower limb, including hip: Secondary | ICD-10-CM | POA: Diagnosis not present

## 2021-04-21 DIAGNOSIS — D225 Melanocytic nevi of trunk: Secondary | ICD-10-CM | POA: Diagnosis not present

## 2021-04-21 DIAGNOSIS — D2261 Melanocytic nevi of right upper limb, including shoulder: Secondary | ICD-10-CM | POA: Diagnosis not present

## 2021-04-21 DIAGNOSIS — D2272 Melanocytic nevi of left lower limb, including hip: Secondary | ICD-10-CM | POA: Diagnosis not present

## 2021-04-21 DIAGNOSIS — D2262 Melanocytic nevi of left upper limb, including shoulder: Secondary | ICD-10-CM | POA: Diagnosis not present

## 2021-05-08 ENCOUNTER — Other Ambulatory Visit: Payer: Self-pay

## 2021-05-08 ENCOUNTER — Emergency Department: Payer: Medicare HMO

## 2021-05-08 DIAGNOSIS — J45901 Unspecified asthma with (acute) exacerbation: Secondary | ICD-10-CM | POA: Diagnosis not present

## 2021-05-08 DIAGNOSIS — J101 Influenza due to other identified influenza virus with other respiratory manifestations: Principal | ICD-10-CM | POA: Diagnosis present

## 2021-05-08 DIAGNOSIS — R0600 Dyspnea, unspecified: Secondary | ICD-10-CM | POA: Diagnosis not present

## 2021-05-08 DIAGNOSIS — Z20822 Contact with and (suspected) exposure to covid-19: Secondary | ICD-10-CM | POA: Diagnosis present

## 2021-05-08 DIAGNOSIS — I10 Essential (primary) hypertension: Secondary | ICD-10-CM | POA: Diagnosis present

## 2021-05-08 DIAGNOSIS — Z85850 Personal history of malignant neoplasm of thyroid: Secondary | ICD-10-CM

## 2021-05-08 DIAGNOSIS — E890 Postprocedural hypothyroidism: Secondary | ICD-10-CM | POA: Diagnosis present

## 2021-05-08 DIAGNOSIS — K219 Gastro-esophageal reflux disease without esophagitis: Secondary | ICD-10-CM | POA: Diagnosis present

## 2021-05-08 DIAGNOSIS — Z833 Family history of diabetes mellitus: Secondary | ICD-10-CM

## 2021-05-08 DIAGNOSIS — R0902 Hypoxemia: Secondary | ICD-10-CM | POA: Diagnosis not present

## 2021-05-08 DIAGNOSIS — J9601 Acute respiratory failure with hypoxia: Secondary | ICD-10-CM | POA: Diagnosis present

## 2021-05-08 DIAGNOSIS — Z7951 Long term (current) use of inhaled steroids: Secondary | ICD-10-CM

## 2021-05-08 DIAGNOSIS — R300 Dysuria: Secondary | ICD-10-CM | POA: Diagnosis present

## 2021-05-08 DIAGNOSIS — Z7901 Long term (current) use of anticoagulants: Secondary | ICD-10-CM | POA: Diagnosis not present

## 2021-05-08 DIAGNOSIS — E860 Dehydration: Secondary | ICD-10-CM | POA: Diagnosis not present

## 2021-05-08 DIAGNOSIS — E876 Hypokalemia: Secondary | ICD-10-CM | POA: Diagnosis present

## 2021-05-08 DIAGNOSIS — N179 Acute kidney failure, unspecified: Secondary | ICD-10-CM | POA: Diagnosis present

## 2021-05-08 DIAGNOSIS — Z803 Family history of malignant neoplasm of breast: Secondary | ICD-10-CM | POA: Diagnosis not present

## 2021-05-08 DIAGNOSIS — Z79890 Hormone replacement therapy (postmenopausal): Secondary | ICD-10-CM

## 2021-05-08 DIAGNOSIS — R0602 Shortness of breath: Secondary | ICD-10-CM | POA: Diagnosis not present

## 2021-05-08 DIAGNOSIS — Z79899 Other long term (current) drug therapy: Secondary | ICD-10-CM

## 2021-05-08 DIAGNOSIS — D6489 Other specified anemias: Secondary | ICD-10-CM | POA: Diagnosis not present

## 2021-05-08 DIAGNOSIS — I2510 Atherosclerotic heart disease of native coronary artery without angina pectoris: Secondary | ICD-10-CM | POA: Diagnosis present

## 2021-05-08 DIAGNOSIS — E785 Hyperlipidemia, unspecified: Secondary | ICD-10-CM | POA: Diagnosis present

## 2021-05-08 DIAGNOSIS — I480 Paroxysmal atrial fibrillation: Secondary | ICD-10-CM | POA: Diagnosis present

## 2021-05-08 LAB — BASIC METABOLIC PANEL
Anion gap: 8 (ref 5–15)
BUN: 20 mg/dL (ref 8–23)
CO2: 24 mmol/L (ref 22–32)
Calcium: 9.4 mg/dL (ref 8.9–10.3)
Chloride: 111 mmol/L (ref 98–111)
Creatinine, Ser: 1.56 mg/dL — ABNORMAL HIGH (ref 0.44–1.00)
GFR, Estimated: 37 mL/min — ABNORMAL LOW (ref >60)
Glucose, Bld: 122 mg/dL — ABNORMAL HIGH (ref 70–99)
Potassium: 3.4 mmol/L — ABNORMAL LOW (ref 3.5–5.1)
Sodium: 143 mmol/L (ref 135–145)

## 2021-05-08 LAB — TROPONIN I (HIGH SENSITIVITY): Troponin I (High Sensitivity): 8 ng/L (ref <18)

## 2021-05-08 NOTE — ED Triage Notes (Signed)
Pt states that she became short of breath earlier today, pt has hx asthma, her flare ups are normally relieved by home meds but today she still feels short of breath. Pt denies any cold symptoms. Pt also states that she has had some burning with urination.

## 2021-05-09 ENCOUNTER — Encounter: Payer: Self-pay | Admitting: Family Medicine

## 2021-05-09 ENCOUNTER — Inpatient Hospital Stay
Admission: EM | Admit: 2021-05-09 | Discharge: 2021-05-10 | DRG: 193 | Disposition: A | Discharge status: Home / Self Care | Payer: Medicare HMO | Attending: Student in an Organized Health Care Education/Training Program | Admitting: Student in an Organized Health Care Education/Training Program

## 2021-05-09 DIAGNOSIS — I10 Essential (primary) hypertension: Secondary | ICD-10-CM | POA: Diagnosis present

## 2021-05-09 DIAGNOSIS — Z803 Family history of malignant neoplasm of breast: Secondary | ICD-10-CM | POA: Diagnosis not present

## 2021-05-09 DIAGNOSIS — J45901 Unspecified asthma with (acute) exacerbation: Secondary | ICD-10-CM | POA: Diagnosis present

## 2021-05-09 DIAGNOSIS — J101 Influenza due to other identified influenza virus with other respiratory manifestations: Secondary | ICD-10-CM | POA: Diagnosis present

## 2021-05-09 DIAGNOSIS — Z85850 Personal history of malignant neoplasm of thyroid: Secondary | ICD-10-CM | POA: Diagnosis not present

## 2021-05-09 DIAGNOSIS — N179 Acute kidney failure, unspecified: Secondary | ICD-10-CM

## 2021-05-09 DIAGNOSIS — Z7901 Long term (current) use of anticoagulants: Secondary | ICD-10-CM | POA: Diagnosis not present

## 2021-05-09 DIAGNOSIS — K219 Gastro-esophageal reflux disease without esophagitis: Secondary | ICD-10-CM | POA: Diagnosis present

## 2021-05-09 DIAGNOSIS — Z79899 Other long term (current) drug therapy: Secondary | ICD-10-CM | POA: Diagnosis not present

## 2021-05-09 DIAGNOSIS — Z20822 Contact with and (suspected) exposure to covid-19: Secondary | ICD-10-CM | POA: Diagnosis present

## 2021-05-09 DIAGNOSIS — D6489 Other specified anemias: Secondary | ICD-10-CM | POA: Diagnosis present

## 2021-05-09 DIAGNOSIS — R300 Dysuria: Secondary | ICD-10-CM

## 2021-05-09 DIAGNOSIS — I2510 Atherosclerotic heart disease of native coronary artery without angina pectoris: Secondary | ICD-10-CM | POA: Diagnosis present

## 2021-05-09 DIAGNOSIS — E890 Postprocedural hypothyroidism: Secondary | ICD-10-CM | POA: Diagnosis present

## 2021-05-09 DIAGNOSIS — Z79890 Hormone replacement therapy (postmenopausal): Secondary | ICD-10-CM | POA: Diagnosis not present

## 2021-05-09 DIAGNOSIS — J9601 Acute respiratory failure with hypoxia: Secondary | ICD-10-CM | POA: Diagnosis present

## 2021-05-09 DIAGNOSIS — R0600 Dyspnea, unspecified: Secondary | ICD-10-CM | POA: Diagnosis not present

## 2021-05-09 DIAGNOSIS — Z833 Family history of diabetes mellitus: Secondary | ICD-10-CM | POA: Diagnosis not present

## 2021-05-09 DIAGNOSIS — R0902 Hypoxemia: Secondary | ICD-10-CM | POA: Diagnosis not present

## 2021-05-09 DIAGNOSIS — Z7951 Long term (current) use of inhaled steroids: Secondary | ICD-10-CM | POA: Diagnosis not present

## 2021-05-09 DIAGNOSIS — E876 Hypokalemia: Secondary | ICD-10-CM | POA: Diagnosis present

## 2021-05-09 DIAGNOSIS — E860 Dehydration: Secondary | ICD-10-CM | POA: Diagnosis present

## 2021-05-09 DIAGNOSIS — E785 Hyperlipidemia, unspecified: Secondary | ICD-10-CM | POA: Diagnosis present

## 2021-05-09 DIAGNOSIS — I480 Paroxysmal atrial fibrillation: Secondary | ICD-10-CM | POA: Diagnosis present

## 2021-05-09 LAB — CBC WITH DIFFERENTIAL/PLATELET
Abs Immature Granulocytes: 0.04 10*3/uL (ref 0.00–0.07)
Basophils Absolute: 0 10*3/uL (ref 0.0–0.1)
Basophils Relative: 0 %
Eosinophils Absolute: 0 10*3/uL (ref 0.0–0.5)
Eosinophils Relative: 0 %
HCT: 34.6 % — ABNORMAL LOW (ref 36.0–46.0)
Hemoglobin: 11.1 g/dL — ABNORMAL LOW (ref 12.0–15.0)
Immature Granulocytes: 1 %
Lymphocytes Relative: 5 %
Lymphs Abs: 0.3 10*3/uL — ABNORMAL LOW (ref 0.7–4.0)
MCH: 30.3 pg (ref 26.0–34.0)
MCHC: 32.1 g/dL (ref 30.0–36.0)
MCV: 94.5 fL (ref 80.0–100.0)
Monocytes Absolute: 0.5 10*3/uL (ref 0.1–1.0)
Monocytes Relative: 7 %
Neutro Abs: 5.9 10*3/uL (ref 1.7–7.7)
Neutrophils Relative %: 87 %
Platelets: 186 10*3/uL (ref 150–400)
RBC: 3.66 MIL/uL — ABNORMAL LOW (ref 3.87–5.11)
RDW: 15.2 % (ref 11.5–15.5)
WBC: 6.8 10*3/uL (ref 4.0–10.5)
nRBC: 0 % (ref 0.0–0.2)

## 2021-05-09 LAB — URINALYSIS, ROUTINE W REFLEX MICROSCOPIC
Bacteria, UA: NONE SEEN
Bilirubin Urine: NEGATIVE
Glucose, UA: NEGATIVE mg/dL
Hgb urine dipstick: NEGATIVE
Ketones, ur: NEGATIVE mg/dL
Leukocytes,Ua: NEGATIVE
Nitrite: NEGATIVE
Protein, ur: 30 mg/dL — AB
Specific Gravity, Urine: 1.016 (ref 1.005–1.030)
pH: 5 (ref 5.0–8.0)

## 2021-05-09 LAB — BASIC METABOLIC PANEL
Anion gap: 9 (ref 5–15)
BUN: 18 mg/dL (ref 8–23)
CO2: 23 mmol/L (ref 22–32)
Calcium: 8.1 mg/dL — ABNORMAL LOW (ref 8.9–10.3)
Chloride: 107 mmol/L (ref 98–111)
Creatinine, Ser: 1.47 mg/dL — ABNORMAL HIGH (ref 0.44–1.00)
GFR, Estimated: 39 mL/min — ABNORMAL LOW (ref >60)
Glucose, Bld: 254 mg/dL — ABNORMAL HIGH (ref 70–99)
Potassium: 3.2 mmol/L — ABNORMAL LOW (ref 3.5–5.1)
Sodium: 139 mmol/L (ref 135–145)

## 2021-05-09 LAB — TROPONIN I (HIGH SENSITIVITY): Troponin I (High Sensitivity): 9 ng/L (ref <18)

## 2021-05-09 LAB — RESP PANEL BY RT-PCR (FLU A&B, COVID) ARPGX2
Influenza A by PCR: POSITIVE — AB
Influenza B by PCR: NEGATIVE
SARS Coronavirus 2 by RT PCR: NEGATIVE

## 2021-05-09 MED ORDER — METHYLPREDNISOLONE SODIUM SUCC 125 MG IJ SOLR
125.0000 mg | Freq: Once | INTRAMUSCULAR | Status: AC
  Administered 2021-05-09: 03:00:00 125 mg via INTRAVENOUS
  Filled 2021-05-09: qty 2

## 2021-05-09 MED ORDER — DRONEDARONE HCL 400 MG PO TABS
400.0000 mg | ORAL_TABLET | Freq: Two times a day (BID) | ORAL | Status: DC
  Administered 2021-05-09 – 2021-05-10 (×3): 400 mg via ORAL
  Filled 2021-05-09 (×4): qty 1

## 2021-05-09 MED ORDER — IPRATROPIUM-ALBUTEROL 0.5-2.5 (3) MG/3ML IN SOLN
3.0000 mL | Freq: Once | RESPIRATORY_TRACT | Status: AC
  Administered 2021-05-09: 04:00:00 3 mL via RESPIRATORY_TRACT
  Filled 2021-05-09: qty 3

## 2021-05-09 MED ORDER — MONTELUKAST SODIUM 10 MG PO TABS
10.0000 mg | ORAL_TABLET | Freq: Every day | ORAL | Status: DC
  Administered 2021-05-09: 21:00:00 10 mg via ORAL
  Filled 2021-05-09: qty 1

## 2021-05-09 MED ORDER — ACETAMINOPHEN 500 MG PO TABS
1000.0000 mg | ORAL_TABLET | Freq: Once | ORAL | Status: AC
  Administered 2021-05-09: 03:00:00 1000 mg via ORAL
  Filled 2021-05-09: qty 2

## 2021-05-09 MED ORDER — ASCORBIC ACID 500 MG PO TABS
500.0000 mg | ORAL_TABLET | Freq: Every day | ORAL | Status: DC
  Administered 2021-05-09 – 2021-05-10 (×2): 500 mg via ORAL
  Filled 2021-05-09 (×2): qty 1

## 2021-05-09 MED ORDER — SUCRALFATE 1 G PO TABS
1.0000 g | ORAL_TABLET | Freq: Two times a day (BID) | ORAL | Status: DC
Start: 2021-05-09 — End: 2021-05-10
  Administered 2021-05-09 – 2021-05-10 (×3): 1 g via ORAL
  Filled 2021-05-09 (×3): qty 1

## 2021-05-09 MED ORDER — CALCITRIOL 0.25 MCG PO CAPS
0.5000 ug | ORAL_CAPSULE | Freq: Every day | ORAL | Status: DC
  Administered 2021-05-09 – 2021-05-10 (×2): 0.5 ug via ORAL
  Filled 2021-05-09 (×2): qty 2

## 2021-05-09 MED ORDER — ATORVASTATIN CALCIUM 20 MG PO TABS
80.0000 mg | ORAL_TABLET | Freq: Every day | ORAL | Status: DC
  Administered 2021-05-09: 21:00:00 80 mg via ORAL
  Filled 2021-05-09: qty 4

## 2021-05-09 MED ORDER — ACETAMINOPHEN 650 MG RE SUPP: 650.0000 mg | Freq: Four times a day (QID) | RECTAL | Status: DC | PRN

## 2021-05-09 MED ORDER — POLYSACCHARIDE IRON COMPLEX 150 MG PO CAPS
150.0000 mg | ORAL_CAPSULE | Freq: Every day | ORAL | Status: DC
  Administered 2021-05-09 – 2021-05-10 (×2): 150 mg via ORAL
  Filled 2021-05-09 (×2): qty 1

## 2021-05-09 MED ORDER — THIAMINE HCL 100 MG PO TABS
100.0000 mg | ORAL_TABLET | Freq: Every day | ORAL | Status: DC
  Administered 2021-05-09 – 2021-05-10 (×2): 100 mg via ORAL
  Filled 2021-05-09 (×2): qty 1

## 2021-05-09 MED ORDER — SODIUM CHLORIDE 0.9 % IV BOLUS
1000.0000 mL | Freq: Once | INTRAVENOUS | Status: AC
  Administered 2021-05-09: 03:00:00 1000 mL via INTRAVENOUS

## 2021-05-09 MED ORDER — ACETAMINOPHEN 325 MG PO TABS
650.0000 mg | ORAL_TABLET | Freq: Four times a day (QID) | ORAL | Status: DC | PRN
  Administered 2021-05-10: 04:00:00 650 mg via ORAL
  Filled 2021-05-09: qty 2

## 2021-05-09 MED ORDER — SODIUM CHLORIDE 0.9 % IV SOLN: INTRAVENOUS | Status: DC

## 2021-05-09 MED ORDER — POTASSIUM CHLORIDE CRYS ER 20 MEQ PO TBCR
20.0000 meq | EXTENDED_RELEASE_TABLET | Freq: Once | ORAL | Status: AC
  Administered 2021-05-09: 17:00:00 20 meq via ORAL
  Filled 2021-05-09: qty 1

## 2021-05-09 MED ORDER — ONDANSETRON HCL 4 MG/2ML IJ SOLN: 4.0000 mg | Freq: Four times a day (QID) | INTRAMUSCULAR | Status: DC | PRN

## 2021-05-09 MED ORDER — NITROGLYCERIN 0.4 MG SL SUBL: 0.4000 mg | SUBLINGUAL_TABLET | SUBLINGUAL | Status: DC | PRN

## 2021-05-09 MED ORDER — CALCITRIOL 0.25 MCG PO CAPS
0.2500 ug | ORAL_CAPSULE | Freq: Every evening | ORAL | Status: DC
Start: 2021-05-09 — End: 2021-05-10
  Administered 2021-05-10: 17:00:00 0.25 ug via ORAL
  Filled 2021-05-09 (×2): qty 1

## 2021-05-09 MED ORDER — GEMFIBROZIL 600 MG PO TABS
600.0000 mg | ORAL_TABLET | Freq: Two times a day (BID) | ORAL | Status: DC
  Administered 2021-05-09 – 2021-05-10 (×3): 600 mg via ORAL
  Filled 2021-05-09 (×4): qty 1

## 2021-05-09 MED ORDER — APIXABAN 5 MG PO TABS
5.0000 mg | ORAL_TABLET | Freq: Two times a day (BID) | ORAL | Status: DC
  Administered 2021-05-09 – 2021-05-10 (×3): 5 mg via ORAL
  Filled 2021-05-09 (×3): qty 1

## 2021-05-09 MED ORDER — HYDROCOD POLST-CPM POLST ER 10-8 MG/5ML PO SUER
5.0000 mL | Freq: Once | ORAL | Status: AC
  Administered 2021-05-09: 5 mL via ORAL
  Filled 2021-05-09: qty 5

## 2021-05-09 MED ORDER — CALCITRIOL 0.25 MCG PO CAPS: 0.2500 ug | ORAL_CAPSULE | ORAL | Status: DC

## 2021-05-09 MED ORDER — TRAZODONE HCL 50 MG PO TABS: 25.0000 mg | ORAL_TABLET | Freq: Every evening | ORAL | Status: DC | PRN

## 2021-05-09 MED ORDER — IPRATROPIUM-ALBUTEROL 0.5-2.5 (3) MG/3ML IN SOLN
3.0000 mL | Freq: Four times a day (QID) | RESPIRATORY_TRACT | Status: DC
  Administered 2021-05-09 – 2021-05-10 (×4): 3 mL via RESPIRATORY_TRACT
  Filled 2021-05-09 (×4): qty 3

## 2021-05-09 MED ORDER — PREDNISONE 20 MG PO TABS
40.0000 mg | ORAL_TABLET | Freq: Every day | ORAL | Status: DC
  Administered 2021-05-10: 08:00:00 40 mg via ORAL
  Filled 2021-05-09: qty 2

## 2021-05-09 MED ORDER — CALCIUM CARBONATE ANTACID 500 MG PO CHEW
1000.0000 mg | CHEWABLE_TABLET | Freq: Every day | ORAL | Status: DC
  Administered 2021-05-09 – 2021-05-10 (×2): 1000 mg via ORAL
  Filled 2021-05-09 (×2): qty 5

## 2021-05-09 MED ORDER — DONEPEZIL HCL 5 MG PO TABS
10.0000 mg | ORAL_TABLET | Freq: Every day | ORAL | Status: DC
  Administered 2021-05-09 – 2021-05-10 (×2): 10 mg via ORAL
  Filled 2021-05-09 (×2): qty 2

## 2021-05-09 MED ORDER — OSELTAMIVIR PHOSPHATE 75 MG PO CAPS: 75.0000 mg | ORAL_CAPSULE | Freq: Two times a day (BID) | ORAL | Status: DC

## 2021-05-09 MED ORDER — ONDANSETRON HCL 4 MG PO TABS: 4.0000 mg | ORAL_TABLET | Freq: Four times a day (QID) | ORAL | Status: DC | PRN

## 2021-05-09 MED ORDER — GUAIFENESIN ER 600 MG PO TB12
600.0000 mg | ORAL_TABLET | Freq: Two times a day (BID) | ORAL | Status: DC
  Administered 2021-05-09 – 2021-05-10 (×3): 600 mg via ORAL
  Filled 2021-05-09 (×3): qty 1

## 2021-05-09 MED ORDER — METHYLPREDNISOLONE SODIUM SUCC 40 MG IJ SOLR
40.0000 mg | Freq: Two times a day (BID) | INTRAMUSCULAR | Status: AC
  Administered 2021-05-09 (×2): 40 mg via INTRAVENOUS
  Filled 2021-05-09 (×2): qty 1

## 2021-05-09 MED ORDER — IPRATROPIUM-ALBUTEROL 0.5-2.5 (3) MG/3ML IN SOLN
3.0000 mL | Freq: Once | RESPIRATORY_TRACT | Status: AC
  Administered 2021-05-09: 03:00:00 3 mL via RESPIRATORY_TRACT
  Filled 2021-05-09: qty 3

## 2021-05-09 MED ORDER — OSELTAMIVIR PHOSPHATE 30 MG PO CAPS
30.0000 mg | ORAL_CAPSULE | Freq: Two times a day (BID) | ORAL | Status: DC
  Administered 2021-05-09 – 2021-05-10 (×3): 30 mg via ORAL
  Filled 2021-05-09 (×4): qty 1

## 2021-05-09 MED ORDER — POTASSIUM CHLORIDE CRYS ER 20 MEQ PO TBCR
10.0000 meq | EXTENDED_RELEASE_TABLET | Freq: Every day | ORAL | Status: DC
  Administered 2021-05-09: 09:00:00 10 meq via ORAL
  Filled 2021-05-09: qty 1

## 2021-05-09 MED ORDER — DILTIAZEM HCL ER 60 MG PO CP12
120.0000 mg | ORAL_CAPSULE | Freq: Every day | ORAL | Status: DC
  Administered 2021-05-09 – 2021-05-10 (×2): 120 mg via ORAL
  Filled 2021-05-09 (×2): qty 2

## 2021-05-09 MED ORDER — LEVOTHYROXINE SODIUM 112 MCG PO TABS
112.0000 ug | ORAL_TABLET | Freq: Every day | ORAL | Status: DC
  Administered 2021-05-09 – 2021-05-10 (×2): 112 ug via ORAL
  Filled 2021-05-09 (×3): qty 1

## 2021-05-09 MED ORDER — MAGNESIUM HYDROXIDE 400 MG/5ML PO SUSP
30.0000 mL | Freq: Every day | ORAL | Status: DC | PRN
  Filled 2021-05-09: qty 30

## 2021-05-09 MED ORDER — BENZONATATE 100 MG PO CAPS
100.0000 mg | ORAL_CAPSULE | Freq: Three times a day (TID) | ORAL | Status: DC | PRN
  Administered 2021-05-10: 04:00:00 100 mg via ORAL
  Filled 2021-05-09: qty 1

## 2021-05-09 NOTE — ED Notes (Signed)
Pt assisted to restroom & back to bed. Pt now resting in bed, NAD. No needs identified at this time. Bed low & locked; call light & personal items within reach.

## 2021-05-09 NOTE — ED Notes (Signed)
Pt resting in bed, alert and oriented, talking to RN at this time.

## 2021-05-09 NOTE — H&P (Addendum)
[place]   PATIENT NAME: Brenda Barber    MR#:  [id number]  DATE OF BIRTH:  January 18, 1956  DATE OF ADMISSION:  05/09/2021  PRIMARY CARE PHYSICIAN: [name], MD   Patient is coming from: Home  REQUESTING/REFERRING PHYSICIAN: Lurline Hare, MD  CHIEF COMPLAINT:   Chief Complaint  Patient presents with   Shortness of Breath   Dysuria    HISTORY OF PRESENT ILLNESS:  Brenda Barber is a 65 y.o. female with medical history significant for atrial fibrillation, on Eliquis, hypertension, dyslipidemia, hypothyroidism and sleep apnea on CPAP, who presented to the ER with acute onset of cough productive of clear sputum as well as chest congestion, wheezing and dyspnea.  She admits to body aches but denies any fever or chills.  No dysuria, oliguria or hematuria or flank pain.  No chest pain or palpitations.  She denies any nausea or vomiting or diarrhea.  ED Course: To the ER blood pressure was 151/79 with otherwise normal vital signs.  Pulse ox 90 was 89% on room air and 97% on 2 L of O2 by nasal cannula.  Labs revealed potassium at 3.4 and creatinine 1.56 with otherwise unremarkable BMP.  CBC showed mild anemia.  Influenza antigen a came back positive.  Influenza antigen B and COVID-19 PCR came back negative.  UA showed 6-10 WBCs and 11-20 RBCs with no bacteria and 30 protein. EKG as reviewed by me : EKG showed normal sinus rhythm with a rate of 100 with poor R wave progression and prolonged QT interval with QTC of 482 MS Imaging: Two-view chest x-ray showed no acute cardiopulmonary disease.  The patient was given 1 g of p.o. Tylenol, Tussionex, duo nebs twice, 125 mg of IV Solu-Medrol and p.o. Tamiflu as well as 1 L bolus of IV normal saline.  She will be admitted to a medical telemetry bed for further evaluation and management. PAST MEDICAL HISTORY:   Past Medical History:  Diagnosis Date   Allergy    Anemia    Asthma    Atrial fibrillation ([place])    Cerebrovascular accident  ([place]) 2001   NO RESIDUAL EFFECTS   Chiari I malformation ([place])    Coronary artery disease    Depression    Dyspnea    Dysrhythmia    atrial fib   GERD (gastroesophageal reflux disease)    Heart murmur    Hyperlipidemia    Hypertension    Hypoparathyroidism ([place])    Hypothyroidism    Personality disorder, depressive    PONV (postoperative nausea and vomiting)    difficulty breathing during endoscopy, colonoscopy performed prior w/out problems-NAUSEA ONLY   Renal insufficiency    Sleep apnea    CPAP   Thyroid cancer ([place]) 1999   Papillary   Thyroid disease    hypothyroid     PAST SURGICAL HISTORY:   Past Surgical History:  Procedure Laterality Date   CANNOT TOLERATE PAP / Virginal     CATARACT EXTRACTION, BILATERAL     ELECTROMAGNETIC NAVIGATION BROCHOSCOPY Left 10/24/2018   Procedure: ELECTROMAGNETIC NAVIGATION BRONCHOSCOPY LEFT;  Surgeon: Tyler Pita, MD;  Location: [facility];  Service: Cardiopulmonary;  Laterality: Left;   LEFT HEART CATH AND CORONARY ANGIOGRAPHY Right 11/18/2017   Procedure: Left Heart Cath with possible coronary intervention;  Surgeon: Dionisio David, MD;  Location: [place] [facility];  Service: Cardiovascular;  Laterality: Right;   MRI brain  12/1999   THYROIDECTOMY     TONSILLECTOMY  TOTAL HIP ARTHROPLASTY Right 06/26/2016   Procedure: TOTAL HIP ARTHROPLASTY ANTERIOR APPROACH;  Surgeon: Hessie Knows, MD;  Location: [facility];  Service: Orthopedics;  Laterality: Right;   VIDEO BRONCHOSCOPY WITH ENDOBRONCHIAL NAVIGATION Left 04/01/2020   Procedure: VIDEO BRONCHOSCOPY WITH ENDOBRONCHIAL NAVIGATION;  Surgeon: Tyler Pita, MD;  Location: [facility];  Service: Pulmonary;  Laterality: Left;    SOCIAL HISTORY:   Social History   Tobacco Use   Smoking status: Never   Smokeless tobacco: Never  Substance Use Topics   Alcohol use: No    FAMILY HISTORY:   Family History  Problem Relation Age of Onset    Diabetes Father    Diabetes Paternal Aunt    Diabetes Paternal Uncle    Breast cancer Sister 35    DRUG ALLERGIES:   Allergies  Allergen Reactions   Aspirin-Dipyridamole Er Other (See Comments)    REACTION: Headache AGGRENOX    Rosuvastatin Other (See Comments)    REACTION: Not effective    REVIEW OF SYSTEMS:   ROS As per history of present illness. All pertinent systems were reviewed above. Constitutional, HEENT, cardiovascular, respiratory, GI, GU, musculoskeletal, neuro, psychiatric, endocrine, integumentary and hematologic systems were reviewed and are otherwise negative/unremarkable except for positive findings mentioned above in the HPI.   MEDICATIONS AT HOME:   Prior to Admission medications   Medication Sig Start Date End Date Taking? Authorizing Provider  acetaminophen (TYLENOL) 500 MG tablet Take 1,000 mg by mouth every 6 (six) hours as needed for mild pain.    Yes [provider]  albuterol (PROVENTIL HFA;VENTOLIN HFA) 108 (90 BASE) MCG/ACT inhaler Inhale 2 puffs into the lungs every 6 (six) hours as needed for wheezing or shortness of breath.    Yes [provider]  apixaban (ELIQUIS) 5 MG TABS tablet Take 5 mg by mouth 2 (two) times daily.    Yes [provider]  atorvastatin (LIPITOR) 80 MG tablet Take 80 mg by mouth at bedtime.  06/12/15  Yes [provider]  Azelastine HCl 0.15 % SOLN Place 1 spray into both nostrils 2 (two) times daily. Use in each nostril as directed    Yes [provider]  calcitRIOL (ROCALTROL) 0.25 MCG capsule Take 0.25-0.5 mcg by mouth See admin instructions. Take 0.5 mcg by mouth every morning and 0.25 mcg every evening.   Yes [provider]  Calcium Carbonate Antacid (TUMS ULTRA 1000 PO) Take 1,000 mg by mouth daily.   Yes [provider]  diltiazem (TIAZAC) 120 MG 24 hr capsule Take 120 mg by mouth daily. 04/10/21  Yes [provider]  donepezil (ARICEPT) 10 MG  tablet Take 10 mg by mouth every morning.  09/07/19 05/09/21 Yes [provider]  Fluticasone-Umeclidin-Vilant (TRELEGY ELLIPTA) 200-62.5-25 MCG/INH AEPB Inhale 1 puff into the lungs every morning.    Yes [provider]  gemfibrozil (LOPID) 600 MG tablet Take 600 mg by mouth 2 (two) times daily. 04/10/21  Yes [provider]  hydrocortisone 2.5 % ointment Apply topically. 04/21/21  Yes [provider]  iron polysaccharides (NIFEREX) 150 MG capsule Take 150 mg by mouth daily.  08/26/17  Yes [provider]  ketoconazole (NIZORAL) 2 % shampoo Apply topically. 04/21/21  Yes [provider]  levothyroxine (SYNTHROID) 112 MCG tablet Take 112 mcg by mouth daily. 05/03/21  Yes [provider]  montelukast (SINGULAIR) 10 MG tablet Take 10 mg by mouth at bedtime.   Yes [provider]  MULTAQ 400 MG tablet  Take 400 mg by mouth 2 (two) times daily. 04/10/21  Yes [provider]  Multiple Vitamin (MULTIVITAMIN) tablet Take 1 tablet by mouth daily.   Yes [provider]  potassium chloride (KLOR-CON) 10 MEQ tablet Take 10 mEq by mouth daily. 04/04/21  Yes [provider]  Propylene Glycol 0.6 % SOLN Place 1 drop into both eyes at bedtime.   Yes [provider]  sucralfate (CARAFATE) 1 g tablet Take 1 g by mouth 2 (two) times daily.   Yes [provider]  thiamine 100 MG tablet Take 100 mg by mouth daily.   Yes [provider]  vitamin C (ASCORBIC ACID) 500 MG tablet Take 500 mg by mouth daily.   Yes [provider]  benzonatate (TESSALON) 100 MG capsule Take 100 mg by mouth 3 (three) times daily as needed for cough.  09/01/18   [provider]  calcium carbonate (TUMS EX) 750 MG chewable tablet Chew 1 tablet by mouth at bedtime.  Patient not taking: Reported on 05/09/2021    [provider]  doxycycline (VIBRA-TABS) 100 MG tablet Take 1 tablet (100 mg total) by  mouth 2 (two) times daily. Patient not taking: Reported on 05/09/2021 04/08/20   Tyler Pita, MD  gabapentin (NEURONTIN) 100 MG capsule Take 100 mg by mouth every morning.  Patient not taking: Reported on 05/09/2021 09/30/19   [provider]  icosapent Ethyl (VASCEPA) 1 g capsule  08/03/19   [provider]  isosorbide mononitrate (IMDUR) 30 MG 24 hr tablet Take 30 mg by mouth every morning.  Patient not taking: Reported on 05/09/2021 07/29/19   [provider]  losartan (COZAAR) 50 MG tablet Take 50 mg by mouth every morning.  Patient not taking: Reported on 05/09/2021 10/10/18   [provider]  niacin 500 MG tablet Take 500 mg by mouth daily. Patient not taking: Reported on 05/09/2021    [provider]  nitroGLYCERIN (NITROSTAT) 0.4 MG SL tablet Place 0.4 mg under the tongue every 5 (five) minutes as needed for chest pain.     [provider]  sotalol (BETAPACE) 80 MG tablet Take 80 mg by mouth every morning.  Patient not taking: Reported on 05/09/2021 09/20/17   [provider]  Spacer/Aero-Holding Chambers (AEROCHAMBER MV) inhaler Use as instructed Patient not taking: Reported on 05/09/2021 04/10/19   Tyler Pita, MD      VITAL SIGNS:  Blood pressure (!) 142/70, pulse 99, temperature 98.6 F (37 C), temperature source Oral, resp. rate (!) 24, height 5\' 2"  (1.575 m), weight 68.9 kg, SpO2 97 %.  PHYSICAL EXAMINATION:  Physical Exam  GENERAL:  65 y.o.-year-old Caucasian female patient lying in the bed with no acute distress.  EYES: Pupils equal, round, reactive to light and accommodation. No scleral icterus. Extraocular muscles intact.  HEENT: Head atraumatic, normocephalic. Oropharynx and nasopharynx clear.  NECK:  Supple, no jugular venous distention. No thyroid enlargement, no tenderness.  LUNGS: Normal breath sounds bilaterally, no wheezing, rales,rhonchi or crepitation. No use of accessory muscles of  respiration.  CARDIOVASCULAR: Regular rate and rhythm, S1, S2 normal. No murmurs, rubs, or gallops.  ABDOMEN: Soft, nondistended, nontender. Bowel sounds present. No organomegaly or mass.  EXTREMITIES: No pedal edema, cyanosis, or clubbing.  NEUROLOGIC: Cranial nerves II through XII are intact. Muscle strength 5/5 in all extremities. Sensation intact. Gait not checked.  PSYCHIATRIC: The patient is alert and oriented x 3.  Normal affect and good eye contact. SKIN: No  obvious rash, lesion, or ulcer.   LABORATORY PANEL:   CBC Recent Labs  Lab 05/09/21 0245  WBC 6.8  HGB 11.1*  HCT 34.6*  PLT 186   ------------------------------------------------------------------------------------------------------------------  Chemistries  Recent Labs  Lab 05/08/21 2042  NA 143  K 3.4*  CL 111  CO2 24  GLUCOSE 122*  BUN 20  CREATININE 1.56*  CALCIUM 9.4   ------------------------------------------------------------------------------------------------------------------  Cardiac Enzymes No results for input(s): TROPONINI in the last 168 hours. ------------------------------------------------------------------------------------------------------------------  RADIOLOGY:  DG Chest 2 View  Result Date: 05/08/2021 CLINICAL DATA:  Shortness of breath. EXAM: CHEST - 2 VIEW COMPARISON:  April 01, 2020 FINDINGS: The heart size and mediastinal contours are within normal limits. Both lungs are clear. Degenerative changes seen throughout the thoracic spine. IMPRESSION: No active cardiopulmonary disease. Electronically Signed   By: Virgina Norfolk M.D.   On: 05/08/2021 21:29      IMPRESSION AND PLAN:  Principal Problem:   Influenza A 1.  Influenza antigen with subsequent acute asthma exacerbation.  The patient has mild dehydration with subsequent prerenal acute kidney injury. - The patient will be admitted to a medical telemetry bed. - We will continue her on p.o. Tamiflu. - She will be  continued on IV steroid use with Solu-Medrol and bronchodilator therapy with DuoNebs 4 times daily and every 4 hours as needed. - Mucolytic therapy will be provided. - O2 protocol will be followed. - Singulair will be resumed. - The patient will be hydrated with IV normal saline and will follow BMP.  2.  Mild acute hypoxic respiratory failure secondary to #1. - O2 protocol will be followed.  Oxygen will be tapered off as tolerated.  3.  Dyslipidemia. - We will continue statin therapy and Carafate.  4.  Paroxysmal atrial fibrillation. - We will continue Multaq, Tiazac and apixaban.  5.  Essential hypertension. - We will continue Cozaar, Tiazac  DVT prophylaxis: Apixaban Code Status: full code.  Family Communication:  The plan of care was discussed in details with the patient (and family). I answered all questions. The patient agreed to proceed with the above mentioned plan. Further management will depend upon hospital course. Disposition Plan: Back to previous home environment Consults called: none.  All the records are reviewed and case discussed with ED provider.  Status is: Inpatient  Remains inpatient appropriate because:Ongoing diagnostic testing needed not appropriate for outpatient work up, Unsafe d/c plan, IV treatments appropriate due to intensity of illness or inability to take PO, and Inpatient level of care appropriate due to severity of illness   Dispo: The patient is from: Home              Anticipated d/c is to: Home              Patient currently is not medically stable to d/c.              Difficult to place patient: No    Christel Mormon M.D on 05/09/2021 at [place] AM  [facility]   From 7 PM-7 AM, contact night-coverage [email or web address]  CC: Primary care physician; [name], MD

## 2021-05-09 NOTE — Progress Notes (Signed)
PROGRESS NOTE   HPI was taken from Dr. Sidney Ace: Brenda Barber is a 65 y.o. female with medical history significant for atrial fibrillation, on Eliquis, hypertension, dyslipidemia, hypothyroidism and sleep apnea on CPAP, who presented to the ER with acute onset of cough productive of clear sputum as well as chest congestion, wheezing and dyspnea.  She admits to body aches but denies any fever or chills.  No dysuria, oliguria or hematuria or flank pain.  No chest pain or palpitations.  She denies any nausea or vomiting or diarrhea.   ED Course: To the ER blood pressure was 151/79 with otherwise normal vital signs.  Pulse ox 90 was 89% on room air and 97% on 2 L of O2 by nasal cannula.  Labs revealed potassium at 3.4 and creatinine 1.56 with otherwise unremarkable BMP.  CBC showed mild anemia.  Influenza antigen a came back positive.  Influenza antigen B and COVID-19 PCR came back negative.  UA showed 6-10 WBCs and 11-20 RBCs with no bacteria and 30 protein. EKG as reviewed by me : EKG showed normal sinus rhythm with a rate of 100 with poor R wave progression and prolonged QT interval with QTC of 482 MS Imaging: Two-view chest x-ray showed no acute cardiopulmonary disease.  The patient was given 1 g of p.o. Tylenol, Tussionex, duo nebs twice, 125 mg of IV Solu-Medrol and p.o. Tamiflu as well as 1 L bolus of IV normal saline.  She will be admitted to a medical telemetry bed for further evaluation and management.   Brenda Barber  VOJ:[id number] DOB: [birth year] DOA: 05/09/2021 PCP: [name], MD   Assessment & Plan:   Principal Problem:   Influenza A Influenza A infection: continue on tamiflu, steroids & bronchodilators. Encourage incentive spirometry. Continue w/ supportive care. Continue on droplet precautions   Asthma exacerbation: possibly secondary to above. Continue on bronchodilators, steroids, singulair, and encourage incentive spirometry   Respiratory distress: continue on supplemental  oxygen and wean as tolerated.   Hypokalemia: Kl repleated.  HLD: continue on statin, gemfibrozil    PAF: continue on eliquis, cardizem, & multaq  HTN: continue on cardizem,   Hypothyroidism: continue on home dose of levothyroxine    DVT prophylaxis: eliquis  Code Status: full  Family Communication:  Disposition Plan: depends on PT/OT recs   Level of care: Telemetry Medical  Status is: Inpatient  Remains inpatient appropriate because: severity of illness, still SOB      Consultants:    Procedures:   Antimicrobials:   Subjective: Pt c/o shortness of breath   Objective: Vitals:   05/09/21 1230 05/09/21 1300 05/09/21 1330 05/09/21 1400  BP: (!) 161/92 (!) 143/81 (!) 144/75 (!) 146/79  Pulse: 93 88 88 84  Resp:  (!) 28 (!) 26 (!) 27  Temp:      TempSrc:      SpO2: 100% 99% 98% 98%  Weight:      Height:       No intake or output data in the 24 hours ending 05/09/21 1550 Filed Weights   05/08/21 2039  Weight: 68.9 kg    Examination:  General exam: Appears calm but uncomfortable  Respiratory system: diminished +. No rubs, gallops or clicks.  Gastrointestinal system: Abdomen is nondistended, soft and nontender.  Normal bowel sounds heard. Central nervous system: Alert and oriented. Moves all extremities  Psychiatry: Judgement and insight appear normal. Flat mood and affect     Data Reviewed: I have personally reviewed following labs and imaging  studies  CBC: Recent Labs  Lab 05/09/21 0245  WBC 6.8  NEUTROABS 5.9  HGB 11.1*  HCT 34.6*  MCV 94.5  PLT 458   Basic Metabolic Panel: Recent Labs  Lab 05/08/21 2042 05/09/21 0845  NA 143 139  K 3.4* 3.2*  CL 111 107  CO2 24 23  GLUCOSE 122* 254*  BUN 20 18  CREATININE 1.56* 1.47*  CALCIUM 9.4 8.1*   GFR: Estimated Creatinine Clearance: 34.7 mL/min (A) (by C-G formula based on SCr of 1.47 mg/dL (H)). Liver Function Tests: No results for input(s): AST, ALT, ALKPHOS, BILITOT, PROT, ALBUMIN in  the last 168 hours. No results for input(s): LIPASE, AMYLASE in the last 168 hours. No results for input(s): AMMONIA in the last 168 hours. Coagulation Profile: No results for input(s): INR, PROTIME in the last 168 hours. Cardiac Enzymes: No results for input(s): CKTOTAL, CKMB, CKMBINDEX, TROPONINI in the last 168 hours. BNP (last 3 results) No results for input(s): PROBNP in the last 8760 hours. HbA1C: No results for input(s): HGBA1C in the last 72 hours. CBG: No results for input(s): GLUCAP in the last 168 hours. Lipid Profile: No results for input(s): CHOL, HDL, LDLCALC, TRIG, CHOLHDL, LDLDIRECT in the last 72 hours. Thyroid Function Tests: No results for input(s): TSH, T4TOTAL, FREET4, T3FREE, THYROIDAB in the last 72 hours. Anemia Panel: No results for input(s): VITAMINB12, FOLATE, FERRITIN, TIBC, IRON, RETICCTPCT in the last 72 hours. Sepsis Labs: No results for input(s): PROCALCITON, LATICACIDVEN in the last 168 hours.  Recent Results (from the past 240 hour(s))  Resp Panel by RT-PCR (Flu A&B, Covid) Nasopharyngeal Swab     Status: Abnormal   Collection Time: 05/09/21  2:45 AM   Specimen: Nasopharyngeal Swab; Nasopharyngeal(NP) swabs in vial transport medium  Result Value Ref Range Status   SARS Coronavirus 2 by RT PCR NEGATIVE NEGATIVE Final    Comment: (NOTE) SARS-CoV-2 target nucleic acids are NOT DETECTED.  The SARS-CoV-2 RNA is generally detectable in upper respiratory specimens during the acute phase of infection. The lowest concentration of SARS-CoV-2 viral copies this assay can detect is 138 copies/mL. A negative result does not preclude SARS-Cov-2 infection and should not be used as the sole basis for treatment or other patient management decisions. A negative result may occur with  improper specimen collection/handling, submission of specimen other than nasopharyngeal swab, presence of viral mutation(s) within the areas targeted by this assay, and inadequate  number of viral copies(<138 copies/mL). A negative result must be combined with clinical observations, patient history, and epidemiological information. The expected result is Negative.  Fact Sheet for Patients:  EntrepreneurPulse.com.au  Fact Sheet for Healthcare Providers:  IncredibleEmployment.be  This test is no t yet approved or cleared by the [place] FDA and  has been authorized for detection and/or diagnosis of SARS-CoV-2 by FDA under an Emergency Use Authorization (EUA). This EUA will remain  in effect (meaning this test can be used) for the duration of the COVID-19 declaration under Section 564(b)(1) of the Act, 21 U.S.C.section 360bbb-3(b)(1), unless the authorization is terminated  or revoked sooner.       Influenza A by PCR POSITIVE (A) NEGATIVE Final   Influenza B by PCR NEGATIVE NEGATIVE Final    Comment: (NOTE) The Xpert Xpress SARS-CoV-2/FLU/RSV plus assay is intended as an aid in the diagnosis of influenza from Nasopharyngeal swab specimens and should not be used as a sole basis for treatment. Nasal washings and aspirates are unacceptable for Xpert Xpress SARS-CoV-2/FLU/RSV testing.  Fact  Sheet for Patients: EntrepreneurPulse.com.au  Fact Sheet for Healthcare Providers: IncredibleEmployment.be  This test is not yet approved or cleared by the [place] FDA and has been authorized for detection and/or diagnosis of SARS-CoV-2 by FDA under an Emergency Use Authorization (EUA). This EUA will remain in effect (meaning this test can be used) for the duration of the COVID-19 declaration under Section 564(b)(1) of the Act, 21 U.S.C. section 360bbb-3(b)(1), unless the authorization is terminated or revoked.  Performed at [facility], [address], [place], [place] [address]          Radiology Studies: DG Chest 2 View  Result Date: 05/08/2021 CLINICAL DATA:   Shortness of breath. EXAM: CHEST - 2 VIEW COMPARISON:  April 01, 2020 FINDINGS: The heart size and mediastinal contours are within normal limits. Both lungs are clear. Degenerative changes seen throughout the thoracic spine. IMPRESSION: No active cardiopulmonary disease. Electronically Signed   By: Virgina Norfolk M.D.   On: 05/08/2021 21:29        Scheduled Meds:  apixaban  5 mg Oral BID   vitamin C  500 mg Oral Daily   atorvastatin  80 mg Oral QHS   calcitRIOL  0.5 mcg Oral Daily   And   calcitRIOL  0.25 mcg Oral QPM   calcium carbonate  1,000 mg Oral Daily   diltiazem  120 mg Oral Daily   donepezil  10 mg Oral Daily   dronedarone  400 mg Oral BID   gemfibrozil  600 mg Oral BID   guaiFENesin  600 mg Oral BID   ipratropium-albuterol  3 mL Nebulization QID   iron polysaccharides  150 mg Oral Daily   levothyroxine  112 mcg Oral Q0600   methylPREDNISolone (SOLU-MEDROL) injection  40 mg Intravenous Q12H   Followed by   Derrill Memo ON 05/10/2021] predniSONE  40 mg Oral Q breakfast   montelukast  10 mg Oral QHS   oseltamivir  30 mg Oral BID   potassium chloride SA  10 mEq Oral Daily   sucralfate  1 g Oral BID   thiamine  100 mg Oral Daily   Continuous Infusions:     LOS: 0 days    Time spent: 30 mins     Wyvonnia Dusky, MD [facility] Pager [phone]  If 7PM-7AM, please contact night-coverage 05/09/2021, 3:50 PM

## 2021-05-09 NOTE — ED Notes (Signed)
This RN ambulated pt to bathroom and back.  Pt steady with 1 assist. NAD.

## 2021-05-09 NOTE — Plan of Care (Signed)

## 2021-05-09 NOTE — ED Provider Notes (Signed)
[facility] Emergency Department Provider Note   ____________________________________________   Event Date/Time   First MD Initiated Contact with Patient 05/09/21 [phone]     (approximate)  I have reviewed the triage vital signs and the nursing notes.   HISTORY  Chief Complaint Shortness of Breath and Dysuria    HPI Brenda Barber is a 65 y.o. female who presents to the ED from home with a chief complaint of shortness of breath and dysuria.  Patient has a history of asthma not on home oxygen who complains of a 1 day history of cough, congestion and shortness of breath unrelieved by home nebulizer treatments.  Also reports burning with urination.  Denies fever, chest pain, abdominal pain, nausea, vomiting or diarrhea.      Past Medical History:  Diagnosis Date   Allergy    Anemia    Asthma    Atrial fibrillation ([place])    Cerebrovascular accident ([place]) 2001   NO RESIDUAL EFFECTS   Chiari I malformation ([place])    Coronary artery disease    Depression    Dyspnea    Dysrhythmia    atrial fib   GERD (gastroesophageal reflux disease)    Heart murmur    Hyperlipidemia    Hypertension    Hypoparathyroidism ([place])    Hypothyroidism    Personality disorder, depressive    PONV (postoperative nausea and vomiting)    difficulty breathing during endoscopy, colonoscopy performed prior w/out problems-NAUSEA ONLY   Renal insufficiency    Sleep apnea    CPAP   Thyroid cancer ([place]) 1999   Papillary   Thyroid disease    hypothyroid     Patient Active Problem List   Diagnosis Date Noted   Influenza A 05/09/2021   Facial pain 12/02/2017   Headache disorder 12/02/2017   Memory loss or impairment 12/02/2017   Unstable angina ([place]) 11/14/2017   Thyroid cancer ([place]) 09/09/2017   CKD (chronic kidney disease) stage 3, GFR 30-59 ml/min (HCC) 01/16/2017   Primary localized osteoarthritis of right hip 06/26/2016   Anemia, unspecified 03/02/2016   Ileus (HCC)     Emesis    Abdominal pain 01/13/2016   Chiari I malformation ([place]) 06/01/2015   Paroxysmal A-fib ([place]) 06/01/2015   Excessive falling 04/21/2015   Repeated falls 04/21/2015   Seizure ([place]) 03/16/2015   Transient alteration of awareness 03/16/2015   Postsurgical hypoparathyroidism ([place]) 10/31/2014   History of thyroid cancer 04/26/2014   Post-surgical hypothyroidism 04/26/2014   Sleep apnea, obstructive 04/19/2014   Multiple lung nodules 11/09/2013   Personality disorder, depressive 05/08/2011   GERD 12/23/2009   HYPERGLYCEMIA 12/23/2009   HERPES ZOSTER 03/15/2009   GASTROENTERITIS, VIRAL 08/01/2007   Hypothyroidism 05/23/2007   HYPOPARATHYROIDISM 08/12/2006   HYPERLIPIDEMIA 08/12/2006   CORNEAL DISORDER 08/12/2006   Benign essential hypertension 08/12/2006   ALLERGIC RHINITIS 08/12/2006   REACTIVE AIRWAY DISEASE 08/12/2006   POSTMENOPAUSAL STATUS 08/12/2006   ROSACEA 08/12/2006   COUGH, CHRONIC 08/12/2006   CEREBROVASCULAR ACCIDENT, HX OF 08/12/2006   MIGRAINES, HX OF 08/12/2006    Past Surgical History:  Procedure Laterality Date   CANNOT TOLERATE PAP / Virginal     CATARACT EXTRACTION, BILATERAL     ELECTROMAGNETIC NAVIGATION BROCHOSCOPY Left 10/24/2018   Procedure: ELECTROMAGNETIC NAVIGATION BRONCHOSCOPY LEFT;  Surgeon: Tyler Pita, MD;  Location: [facility] ORS;  Service: Cardiopulmonary;  Laterality: Left;   LEFT HEART CATH AND CORONARY ANGIOGRAPHY Right 11/18/2017   Procedure: Left Heart Cath with possible coronary intervention;  Surgeon: Dionisio David, MD;  Location: [facility];  Service: Cardiovascular;  Laterality: Right;   MRI brain  12/1999   THYROIDECTOMY     TONSILLECTOMY     TOTAL HIP ARTHROPLASTY Right 06/26/2016   Procedure: TOTAL HIP ARTHROPLASTY ANTERIOR APPROACH;  Surgeon: Hessie Knows, MD;  Location: [facility] OR[facility];  Service: Orthopedics;  Laterality: Right;   VIDEO BRONCHOSCOPY WITH ENDOBRONCHIAL NAVIGATION Left 04/01/2020   Procedure: VIDEO  BRONCHOSCOPY WITH ENDOBRONCHIAL NAVIGATION;  Surgeon: Tyler Pita, MD;  Location: [facility] OR[facility];  Service: Pulmonary;  Laterality: Left;    Prior to Admission medications   Medication Sig Start Date End Date Taking? Authorizing Provider  acetaminophen (TYLENOL) 500 MG tablet Take 1,000 mg by mouth every 6 (six) hours as needed for mild pain.     [provider]  albuterol (PROVENTIL HFA;VENTOLIN HFA) 108 (90 BASE) MCG/ACT inhaler Inhale 2 puffs into the lungs every 6 (six) hours as needed for wheezing or shortness of breath.     [provider]  apixaban (ELIQUIS) 5 MG TABS tablet Take 5 mg by mouth 2 (two) times daily.     [provider]  atorvastatin (LIPITOR) 80 MG tablet Take 80 mg by mouth at bedtime.  06/12/15   [provider]  Azelastine HCl 0.15 % SOLN Place 1 spray into both nostrils 2 (two) times daily. Use in each nostril as directed     [provider]  benzonatate (TESSALON) 100 MG capsule Take 100 mg by mouth 3 (three) times daily as needed for cough.  09/01/18   [provider]  calcitRIOL (ROCALTROL) 0.25 MCG capsule Take 0.25-0.5 mcg by mouth See admin instructions. Take 0.5 mcg by mouth every morning and 0.25 mcg every evening.    [provider]  calcium carbonate (TUMS E-X 750) 750 MG chewable tablet Chew 1 tablet by mouth at bedtime.     [provider]  Calcium Carbonate Antacid (TUMS ULTRA 1000 PO) Take 1,000 mg by mouth daily.    [provider]  donepezil (ARICEPT) 10 MG tablet Take 10 mg by mouth every morning.  09/07/19 04/01/20  [provider]  doxycycline (VIBRA-TABS) 100 MG tablet Take 1 tablet (100 mg total) by mouth 2 (two) times daily. 04/08/20   Tyler Pita, MD  Fluticasone-Umeclidin-Vilant (TRELEGY ELLIPTA) 200-62.5-25 MCG/INH AEPB Inhale 1 puff into the lungs every morning.     [provider]  gabapentin (NEURONTIN) 100 MG capsule Take 100 mg by mouth every  morning.  09/30/19   [provider]  icosapent Ethyl (VASCEPA) 1 g capsule  08/03/19   [provider]  iron polysaccharides (NIFEREX) 150 MG capsule Take 150 mg by mouth daily.  08/26/17   [provider]  isosorbide mononitrate (IMDUR) 30 MG 24 hr tablet Take 30 mg by mouth every morning.  07/29/19   [provider]  levothyroxine (SYNTHROID, LEVOTHROID) 137 MCG tablet Take 137 mcg by mouth daily before breakfast.  09/04/15   [provider]  losartan (COZAAR) 50 MG tablet Take 50 mg by mouth every morning.  10/10/18   [provider]  montelukast (SINGULAIR) 10 MG tablet Take 10 mg by mouth at bedtime.    [provider]  Multiple Vitamin (MULTIVITAMIN) tablet Take 1 tablet by mouth daily.    [provider]  niacin 500 MG tablet Take 500 mg by mouth daily.    [provider]  nitroGLYCERIN (NITROSTAT) 0.4 MG SL tablet Place 0.4  mg under the tongue every 5 (five) minutes as needed for chest pain.     [provider]  potassium chloride SA (K-DUR,KLOR-CON) 20 MEQ tablet Take 20 mEq by mouth daily.  01/15/18   [provider]  Propylene Glycol (SYSTANE BALANCE) 0.6 % SOLN Place 1 drop into both eyes at bedtime.    [provider]  sotalol (BETAPACE) 80 MG tablet Take 80 mg by mouth every morning.  09/20/17   [provider]  Spacer/Aero-Holding Chambers (AEROCHAMBER MV) inhaler Use as instructed 04/10/19   Tyler Pita, MD  sucralfate (CARAFATE) 1 g tablet Take 1 g by mouth 2 (two) times daily.    [provider]  vitamin C (ASCORBIC ACID) 500 MG tablet Take 500 mg by mouth daily.    [provider]    Allergies Aspirin-dipyridamole er and Rosuvastatin  Family History  Problem Relation Age of Onset   Diabetes Father    Diabetes Paternal Aunt    Diabetes Paternal Uncle    Breast cancer Sister 66    Social History Social History   Tobacco Use   Smoking  status: Never   Smokeless tobacco: Never  Vaping Use   Vaping Use: Never used  Substance Use Topics   Alcohol use: No   Drug use: No    Review of Systems  Constitutional: No fever/chills Eyes: No visual changes. ENT: Positive for congestion.  No sore throat. Cardiovascular: Denies chest pain. Respiratory: Positive for cough and shortness of breath. Gastrointestinal: No abdominal pain.  No nausea, no vomiting.  No diarrhea.  No constipation. Genitourinary: Positive for dysuria. Musculoskeletal: Negative for back pain. Skin: Negative for rash. Neurological: Negative for headaches, focal weakness or numbness.   ____________________________________________   PHYSICAL EXAM:  VITAL SIGNS: ED Triage Vitals  Enc Vitals Group     BP 05/08/21 2038 (!) 142/75     Pulse Rate 05/08/21 2038 (!) 101     Resp 05/08/21 2038 (!) 22     Temp 05/08/21 2038 98.4 F (36.9 C)     Temp Source 05/08/21 2038 Oral     SpO2 05/08/21 2038 94 %     Weight 05/08/21 2039 152 lb (68.9 kg)     Height 05/08/21 2039 5\' 2"  (1.575 m)     Head Circumference --      Peak Flow --      Pain Score 05/08/21 2039 0     Pain Loc --      Pain Edu? --      Excl. in [place]? --     Constitutional: Alert and oriented. Well appearing and in no acute distress. Eyes: Conjunctivae are normal. PERRL. EOMI. Head: Atraumatic. Nose: Congestion/rhinnorhea. Mouth/Throat: Mucous membranes are mildly dry. Neck: No stridor.   Cardiovascular: Normal rate, regular rhythm. Grossly normal heart sounds.  Good peripheral circulation. Respiratory: Increased respiratory effort.  No retractions. Lungs with scattered rhonchi. Gastrointestinal: Soft and nontender. No distention. No abdominal bruits. No CVA tenderness. Musculoskeletal: No lower extremity tenderness nor edema.  No joint effusions. Neurologic:  Normal speech and language. No gross focal neurologic deficits are appreciated. No gait instability. Skin:  Skin is warm, dry and  intact. No rash noted. Psychiatric: Mood and affect are normal. Speech and behavior are normal.  ____________________________________________   LABS (all labs ordered are listed, but only abnormal results are displayed)  Labs Reviewed  RESP PANEL BY RT-PCR (FLU A&B, COVID) ARPGX2 - Abnormal; Notable for the following components:  Result Value   Influenza A by PCR POSITIVE (*)    All other components within normal limits  BASIC METABOLIC PANEL - Abnormal; Notable for the following components:   Potassium 3.4 (*)    Glucose, Bld 122 (*)    Creatinine, Ser 1.56 (*)    GFR, Estimated 37 (*)    All other components within normal limits  URINALYSIS, ROUTINE W REFLEX MICROSCOPIC - Abnormal; Notable for the following components:   Color, Urine YELLOW (*)    APPearance HAZY (*)    Protein, ur 30 (*)    All other components within normal limits  CBC WITH DIFFERENTIAL/PLATELET - Abnormal; Notable for the following components:   RBC 3.66 (*)    Hemoglobin 11.1 (*)    HCT 34.6 (*)    Lymphs Abs 0.3 (*)    All other components within normal limits  URINE CULTURE  CBC WITH DIFFERENTIAL/PLATELET  CBC WITH DIFFERENTIAL/PLATELET  TROPONIN I (HIGH SENSITIVITY)  TROPONIN I (HIGH SENSITIVITY)   ____________________________________________  EKG  ED ECG REPORT I, Abbe Bula J, the attending physician, personally viewed and interpreted this ECG.   Date: 05/09/2021  EKG Time: 2043  Rate: 100  Rhythm: normal sinus rhythm  Axis: Normal  Intervals: QTC 482  ST&T Change: Nonspecific  ____________________________________________  RADIOLOGY I, Adaliah Hiegel J, personally viewed and evaluated these images (plain radiographs) as part of my medical decision making, as well as reviewing the written report by the radiologist.  ED MD interpretation: No acute cardiopulmonary disease  Official radiology report(s): DG Chest 2 View  Result Date: 05/08/2021 CLINICAL DATA:  Shortness of breath.  EXAM: CHEST - 2 VIEW COMPARISON:  April 01, 2020 FINDINGS: The heart size and mediastinal contours are within normal limits. Both lungs are clear. Degenerative changes seen throughout the thoracic spine. IMPRESSION: No active cardiopulmonary disease. Electronically Signed   By: Virgina Norfolk M.D.   On: 05/08/2021 21:29    ____________________________________________   PROCEDURES  Procedure(s) performed (including Critical Care):  .1-3 Lead EKG Interpretation Performed by: Paulette Blanch, MD Authorized by: Paulette Blanch, MD     Interpretation: normal     ECG rate:  95   ECG rate assessment: normal     Rhythm: sinus rhythm     Ectopy: none     Conduction: normal   Comments:     Patient placed on cardiac monitor to evaluate for arrhythmias   ____________________________________________   INITIAL IMPRESSION / ASSESSMENT AND PLAN / ED COURSE  As part of my medical decision making, I reviewed the following data within the electronic MEDICAL RECORD NUMBER History obtained from family, Nursing notes reviewed and incorporated, Labs reviewed, EKG interpreted, Old chart reviewed, Radiograph reviewed, and Notes from prior ED visits     65 year old female presenting with cough, congestion, shortness of breath and dysuria. Differential includes, but is not limited to, viral syndrome, bronchitis including COPD exacerbation, pneumonia, reactive airway disease including asthma, CHF including exacerbation with or without pulmonary/interstitial edema, pneumothorax, ACS, thoracic trauma, and pulmonary embolism.   BMP demonstrates mild AKI compared to prior.  Initial troponin unremarkable.  Will repeat troponin, obtain CBC, respiratory panel, UA.  Chest x-ray unremarkable.  Will initiate treatment with IV fluids, Solu-Medrol, DuoNeb and Tussionex.  Will reassess.  Clinical Course as of 05/09/21 0415  Tue May 09, 2021  0232 Room air saturation 89%; placed on 2 L nasal cannula oxygen with improvement to  94%.  Patient tachypneic with wheezing.  Will administer second DuoNeb.  Updated patient and family member of negative urinalysis and Influenza A+.  Will discuss with hospitalist services for admission. [JS]    Clinical Course User Index [JS] Paulette Blanch, MD     ____________________________________________   FINAL CLINICAL IMPRESSION(S) / ED DIAGNOSES  Final diagnoses:  Mild asthma with exacerbation, unspecified whether persistent  Dyspnea, unspecified type  Hypoxia  Influenza A  AKI (acute kidney injury) [facility])  Dysuria     ED Discharge Orders     None        Note:  This document was prepared using Dragon voice recognition software and may include unintentional dictation errors.    Paulette Blanch, MD 05/09/21 [phone]

## 2021-05-09 NOTE — ED Notes (Signed)
Secure msg sent to Posey Rea, RN for ED to IP SBAR.

## 2021-05-10 DIAGNOSIS — N179 Acute kidney failure, unspecified: Secondary | ICD-10-CM

## 2021-05-10 DIAGNOSIS — R0600 Dyspnea, unspecified: Secondary | ICD-10-CM

## 2021-05-10 DIAGNOSIS — J45901 Unspecified asthma with (acute) exacerbation: Secondary | ICD-10-CM

## 2021-05-10 LAB — BASIC METABOLIC PANEL
Anion gap: 5 (ref 5–15)
BUN: 29 mg/dL — ABNORMAL HIGH (ref 8–23)
CO2: 24 mmol/L (ref 22–32)
Calcium: 8.5 mg/dL — ABNORMAL LOW (ref 8.9–10.3)
Chloride: 110 mmol/L (ref 98–111)
Creatinine, Ser: 1.43 mg/dL — ABNORMAL HIGH (ref 0.44–1.00)
GFR, Estimated: 41 mL/min — ABNORMAL LOW (ref >60)
Glucose, Bld: 156 mg/dL — ABNORMAL HIGH (ref 70–99)
Potassium: 4 mmol/L (ref 3.5–5.1)
Sodium: 139 mmol/L (ref 135–145)

## 2021-05-10 LAB — CBC
HCT: 32.5 % — ABNORMAL LOW (ref 36.0–46.0)
Hemoglobin: 10.4 g/dL — ABNORMAL LOW (ref 12.0–15.0)
MCH: 29.7 pg (ref 26.0–34.0)
MCHC: 32 g/dL (ref 30.0–36.0)
MCV: 92.9 fL (ref 80.0–100.0)
Platelets: 191 10*3/uL (ref 150–400)
RBC: 3.5 MIL/uL — ABNORMAL LOW (ref 3.87–5.11)
RDW: 15.2 % (ref 11.5–15.5)
WBC: 9.5 10*3/uL (ref 4.0–10.5)
nRBC: 0 % (ref 0.0–0.2)

## 2021-05-10 LAB — URINE CULTURE

## 2021-05-10 MED ORDER — IPRATROPIUM-ALBUTEROL 0.5-2.5 (3) MG/3ML IN SOLN
3.0000 mL | Freq: Three times a day (TID) | RESPIRATORY_TRACT | Status: DC
  Administered 2021-05-10: 13:00:00 3 mL via RESPIRATORY_TRACT
  Filled 2021-05-10: qty 3

## 2021-05-10 MED ORDER — TUMS ULTRA 1000 1000 MG PO CHEW: 1000.0000 mg | CHEWABLE_TABLET | Freq: Every day | ORAL | Status: DC | PRN

## 2021-05-10 MED ORDER — OSELTAMIVIR PHOSPHATE 30 MG PO CAPS: 30.0000 mg | ORAL_CAPSULE | Freq: Two times a day (BID) | ORAL | 0 refills | Status: AC

## 2021-05-10 NOTE — TOC Initial Note (Signed)
Transition of Care [facility]) - Initial/Assessment Note    Patient Details  Name: Brenda Barber MRN: [id number] Date of Birth: Aug 06, 1955  Transition [facility]) CM/SW Contact:    Pete Pelt, RN Phone Number: 05/10/2021, 3:16 PM  Clinical Narrative:      Patient has no TOC needs.  Her roommate provides transportation and assists her with pill box.  She has a cane and walker, and feels safe and comfortable returning home on discharge.             Expected Discharge Plan: Home/Self Care Barriers to Discharge: Barriers Resolved   Patient Goals and CMS Choice     Choice offered to / list presented to : NA  Expected Discharge Plan and Services Expected Discharge Plan: Home/Self Care   Discharge Planning Services: CM Consult   Living arrangements for the past 2 months: Single Family Home Expected Discharge Date: 05/10/21                                    Prior Living Arrangements/Services Living arrangements for the past 2 months: Single Family Home Lives with:: Self, Roommate Patient language and need for interpreter reviewed:: Yes (no interpreter required) Do you feel safe going back to the place where you live?: Yes      Need for Family Participation in Patient Care: Yes (Comment) Care giver support system in place?: Yes (comment) Current home services: DME (walker and cane) Criminal Activity/Legal Involvement Pertinent to Current Situation/Hospitalization: No - Comment as needed  Activities of Daily Living Home Assistive Devices/Equipment: None ADL Screening (condition at time of admission) Patient's cognitive ability adequate to safely complete daily activities?: Yes Is the patient deaf or have difficulty hearing?: Yes Does the patient have difficulty seeing, even when wearing glasses/contacts?: No Does the patient have difficulty concentrating, remembering, or making decisions?: No Patient able to express need for assistance with ADLs?: Yes Does the patient  have difficulty dressing or bathing?: No Independently performs ADLs?: Yes (appropriate for developmental age) Does the patient have difficulty walking or climbing stairs?: No Weakness of Legs: None Weakness of Arms/Hands: None  Permission Sought/Granted Permission sought to share information with : Case Manager Permission granted to share information with : Yes, Verbal Permission Granted              Emotional Assessment Appearance:: Appears stated age Attitude/Demeanor/Rapport: Gracious, Engaged Affect (typically observed): Pleasant, Appropriate Orientation: : Oriented to Self, Oriented to Place, Oriented to  Time, Oriented to Situation Alcohol / Substance Use: Not Applicable Psych Involvement: No (comment)  Admission diagnosis:  Dysuria [R30.0] Influenza A [J10.1] Hypoxia [R09.02] AKI (acute kidney injury) ([place]) [N17.9] Dyspnea, unspecified type [R06.00] Mild asthma with exacerbation, unspecified whether persistent [J45.901] Patient Active Problem List   Diagnosis Date Noted   AKI (acute kidney injury) ([place])    Dyspnea    Mild asthma with exacerbation    Influenza A 05/09/2021   Facial pain 12/02/2017   Headache disorder 12/02/2017   Memory loss or impairment 12/02/2017   Unstable angina ([place]) 11/14/2017   Thyroid cancer ([place]) 09/09/2017   CKD (chronic kidney disease) stage 3, GFR 30-59 ml/min ([place]) 01/16/2017   Primary localized osteoarthritis of right hip 06/26/2016   Anemia, unspecified 03/02/2016   Ileus (HCC)    Emesis    Abdominal pain 01/13/2016   Chiari I malformation ([place]) 06/01/2015   Paroxysmal A-fib ([place]) 06/01/2015  Excessive falling 04/21/2015   Repeated falls 04/21/2015   Seizure ([place]) 03/16/2015   Transient alteration of awareness 03/16/2015   Postsurgical hypoparathyroidism ([place]) 10/31/2014   History of thyroid cancer 04/26/2014   Post-surgical hypothyroidism 04/26/2014   Sleep apnea, obstructive 04/19/2014   Multiple lung nodules 11/09/2013    Personality disorder, depressive 05/08/2011   GERD 12/23/2009   HYPERGLYCEMIA 12/23/2009   HERPES ZOSTER 03/15/2009   GASTROENTERITIS, VIRAL 08/01/2007   Hypothyroidism 05/23/2007   HYPOPARATHYROIDISM 08/12/2006   HYPERLIPIDEMIA 08/12/2006   CORNEAL DISORDER 08/12/2006   Benign essential hypertension 08/12/2006   ALLERGIC RHINITIS 08/12/2006   REACTIVE AIRWAY DISEASE 08/12/2006   POSTMENOPAUSAL STATUS 08/12/2006   ROSACEA 08/12/2006   COUGH, CHRONIC 08/12/2006   CEREBROVASCULAR ACCIDENT, HX OF 08/12/2006   MIGRAINES, HX OF 08/12/2006   PCP:  [name], MD Pharmacy:   [facility] [phone] - [address], [place] AT [facility] OF [address] [place] [address] Phone: [phone] Fax: [phone]     Social Determinants of Health ([place]) Interventions    Readmission Risk Interventions No flowsheet data found.

## 2021-05-10 NOTE — Progress Notes (Signed)
Patient discharged to home, Patient stated she felt safe going home at this time. Discharge teaching provided and all questions answered. IV removed.

## 2021-05-10 NOTE — Progress Notes (Signed)
OT Cancellation Note  Patient Details Name: Brenda Barber MRN: [id number] DOB: 1955/08/04   Cancelled Treatment:    Reason Eval/Treat Not Completed: OT screened, no needs identified, will sign off. Order received, chart reviewed. Based on review and consult with PT, pt at baseline level of fxl mobility, states she has no current rehab needs. Will sign off. Please re-consult should pt's needs change. Thank you.  Josiah Lobo, PhD, MS, OTR/L 05/10/21, 4:47 PM
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
## 2021-05-10 NOTE — Discharge Summary (Signed)
Physician Discharge Summary  Brenda Barber:[id number] DOB: 04-23-1956 DOA: 05/09/2021  PCP: [name], MD  Admit date: 05/09/2021 Discharge date: 05/10/2021  Admitted From: Home Disposition: Home  Recommendations for Outpatient Follow-up:  Follow up with PCP within 1-2 weeks   Discharge Condition:stable, improved CODE STATUS:  Code Status: Full Code  Regular healthy diet  Brief/Interim Summary: Pt presented with congestion, cough and overall feeling of unwell. She had a small oxygen dependence of 2L on presentation. Was found to be influenza A positive. She responded well to supportive care and tamiflu and quickly weaned off oxygen. On day of discharge, she was ORA and minimal dyspnea on exertion but no desaturations. She was discharged with the remainder of tamiflu treatment. Otherwise, her chronic conditions were managed with home medications.   Discharge Diagnoses:  Principal Problem:   Influenza A  Allergies as of 05/10/2021       Reactions   Aspirin-dipyridamole Er Other (See Comments)   REACTION: Headache AGGRENOX    Rosuvastatin Other (See Comments)   REACTION: Not effective        Medication List     STOP taking these medications    AeroChamber MV inhaler   calcium carbonate 750 MG chewable tablet Commonly known as: TUMS EX   doxycycline 100 MG tablet Commonly known as: VIBRA-TABS   gabapentin 100 MG capsule Commonly known as: NEURONTIN   isosorbide mononitrate 30 MG 24 hr tablet Commonly known as: IMDUR   losartan 50 MG tablet Commonly known as: COZAAR   niacin 500 MG tablet   sotalol 80 MG tablet Commonly known as: BETAPACE   TUMS ULTRA 1000 PO Replaced by: Tums Ultra 1000 400 MG chewable tablet   Vascepa 1 g capsule Generic drug: icosapent Ethyl       TAKE these medications    acetaminophen 500 MG tablet Commonly known as: TYLENOL Take 1,000 mg by mouth every 6 (six) hours as needed for mild pain.   albuterol 108 (90  Base) MCG/ACT inhaler Commonly known as: VENTOLIN HFA Inhale 2 puffs into the lungs every 6 (six) hours as needed for wheezing or shortness of breath.   apixaban 5 MG Tabs tablet Commonly known as: ELIQUIS Take 5 mg by mouth 2 (two) times daily.   atorvastatin 80 MG tablet Commonly known as: LIPITOR Take 80 mg by mouth at bedtime.   Azelastine HCl 0.15 % Soln Place 1 spray into both nostrils 2 (two) times daily. Use in each nostril as directed   benzonatate 100 MG capsule Commonly known as: TESSALON Take 100 mg by mouth 3 (three) times daily as needed for cough.   calcitRIOL 0.25 MCG capsule Commonly known as: ROCALTROL Take 0.25-0.5 mcg by mouth See admin instructions. Take 0.5 mcg by mouth every morning and 0.25 mcg every evening.   diltiazem 120 MG 24 hr capsule Commonly known as: TIAZAC Take 120 mg by mouth daily.   donepezil 10 MG tablet Commonly known as: ARICEPT Take 10 mg by mouth every morning.   gemfibrozil 600 MG tablet Commonly known as: LOPID Take 600 mg by mouth 2 (two) times daily.   hydrocortisone 2.5 % ointment Apply topically.   iron polysaccharides 150 MG capsule Commonly known as: NIFEREX Take 150 mg by mouth daily.   ketoconazole 2 % shampoo Commonly known as: NIZORAL Apply topically.   levothyroxine 112 MCG tablet Commonly known as: SYNTHROID Take 112 mcg by mouth daily.   montelukast 10 MG tablet Commonly known as: SINGULAIR Take 10  mg by mouth at bedtime.   Multaq 400 MG tablet Generic drug: dronedarone Take 400 mg by mouth 2 (two) times daily.   multivitamin tablet Take 1 tablet by mouth daily.   nitroGLYCERIN 0.4 MG SL tablet Commonly known as: NITROSTAT Place 0.4 mg under the tongue every 5 (five) minutes as needed for chest pain.   oseltamivir 30 MG capsule Commonly known as: TAMIFLU Take 1 capsule (30 mg total) by mouth 2 (two) times daily for 4 days.   potassium chloride 10 MEQ tablet Commonly known as: KLOR-CON Take  10 mEq by mouth daily.   Propylene Glycol 0.6 % Soln Place 1 drop into both eyes at bedtime.   sucralfate 1 g tablet Commonly known as: CARAFATE Take 1 g by mouth 2 (two) times daily.   thiamine 100 MG tablet Take 100 mg by mouth daily.   Trelegy Ellipta 200-62.5-25 MCG/ACT Aepb Generic drug: Fluticasone-Umeclidin-Vilant Inhale 1 puff into the lungs every morning.   Tums Ultra 1000 400 MG chewable tablet Generic drug: calcium elemental as carbonate Chew 3 tablets (1,200 mg total) by mouth daily as needed. Replaces: TUMS ULTRA 1000 PO   vitamin C 500 MG tablet Commonly known as: ASCORBIC ACID Take 500 mg by mouth daily.        Allergies  Allergen Reactions   Aspirin-Dipyridamole Er Other (See Comments)    REACTION: Headache AGGRENOX    Rosuvastatin Other (See Comments)    REACTION: Not effective    Consultations: none  Procedures/Studies: DG Chest 2 View  Result Date: 05/08/2021 CLINICAL DATA:  Shortness of breath. EXAM: CHEST - 2 VIEW COMPARISON:  April 01, 2020 FINDINGS: The heart size and mediastinal contours are within normal limits. Both lungs are clear. Degenerative changes seen throughout the thoracic spine. IMPRESSION: No active cardiopulmonary disease. Electronically Signed   By: Virgina Norfolk M.D.   On: 05/08/2021 21:29    Subjective: Patient feels well overall. Would like to be discharged home.   Discharge Exam: Vitals:   05/10/21 0738 05/10/21 1110  BP: 131/69 101/87  Pulse: 68 94  Resp: 20 16  Temp: 98 F (36.7 C) 98 F (36.7 C)  SpO2: 96% 96%    General: Pt is alert, awake, not in acute distress Cardiovascular: RRR, S1/S2 +, no rubs, no gallops Respiratory: CTA bilaterally, no wheezing, no rhonchi Abdominal: Soft, NT, ND, bowel sounds + Extremities: no edema, no cyanosis  Labs: Basic Metabolic Panel: Recent Labs  Lab 05/08/21 2042 05/09/21 0845 05/10/21 0529  NA 143 139 139  K 3.4* 3.2* 4.0  CL 111 107 110  CO2 24 23 24    GLUCOSE 122* 254* 156*  BUN 20 18 29*  CREATININE 1.56* 1.47* 1.43*  CALCIUM 9.4 8.1* 8.5*   CBC: Recent Labs  Lab 05/09/21 0245 05/10/21 0529  WBC 6.8 9.5  NEUTROABS 5.9  --   HGB 11.1* 10.4*  HCT 34.6* 32.5*  MCV 94.5 92.9  PLT 186 191    Microbiology Recent Results (from the past 240 hour(s))  Resp Panel by RT-PCR (Flu A&B, Covid) Nasopharyngeal Swab     Status: Abnormal   Collection Time: 05/09/21  2:45 AM   Specimen: Nasopharyngeal Swab; Nasopharyngeal(NP) swabs in vial transport medium  Result Value Ref Range Status   SARS Coronavirus 2 by RT PCR NEGATIVE NEGATIVE Final    Comment: (NOTE) SARS-CoV-2 target nucleic acids are NOT DETECTED.  The SARS-CoV-2 RNA is generally detectable in upper respiratory specimens during the acute phase of infection. The  lowest concentration of SARS-CoV-2 viral copies this assay can detect is 138 copies/mL. A negative result does not preclude SARS-Cov-2 infection and should not be used as the sole basis for treatment or other patient management decisions. A negative result may occur with  improper specimen collection/handling, submission of specimen other than nasopharyngeal swab, presence of viral mutation(s) within the areas targeted by this assay, and inadequate number of viral copies(<138 copies/mL). A negative result must be combined with clinical observations, patient history, and epidemiological information. The expected result is Negative.  Fact Sheet for Patients:  EntrepreneurPulse.com.au  Fact Sheet for Healthcare Providers:  IncredibleEmployment.be  This test is no t yet approved or cleared by the [place] FDA and  has been authorized for detection and/or diagnosis of SARS-CoV-2 by FDA under an Emergency Use Authorization (EUA). This EUA will remain  in effect (meaning this test can be used) for the duration of the COVID-19 declaration under Section 564(b)(1) of the Act,  21 U.S.C.section 360bbb-3(b)(1), unless the authorization is terminated  or revoked sooner.       Influenza A by PCR POSITIVE (A) NEGATIVE Final   Influenza B by PCR NEGATIVE NEGATIVE Final    Comment: (NOTE) The Xpert Xpress SARS-CoV-2/FLU/RSV plus assay is intended as an aid in the diagnosis of influenza from Nasopharyngeal swab specimens and should not be used as a sole basis for treatment. Nasal washings and aspirates are unacceptable for Xpert Xpress SARS-CoV-2/FLU/RSV testing.  Fact Sheet for Patients: EntrepreneurPulse.com.au  Fact Sheet for Healthcare Providers: IncredibleEmployment.be  This test is not yet approved or cleared by the [place] FDA and has been authorized for detection and/or diagnosis of SARS-CoV-2 by FDA under an Emergency Use Authorization (EUA). This EUA will remain in effect (meaning this test can be used) for the duration of the COVID-19 declaration under Section 564(b)(1) of the Act, 21 U.S.C. section 360bbb-3(b)(1), unless the authorization is terminated or revoked.  Performed at [facility], [address], [place], [place] [address]   Urine Culture     Status: Abnormal   Collection Time: 05/09/21  2:45 AM   Specimen: Urine, Clean Catch  Result Value Ref Range Status   Specimen Description   Final    URINE, CLEAN CATCH Performed at [facility], [address], [place], [place] [address]    Special Requests   Final    NONE Performed at [facility], [address] [place], [place] [address]    Culture MULTIPLE SPECIES PRESENT, SUGGEST RECOLLECTION (A)  Final   Report Status 05/10/2021 FINAL  Final    Time coordinating discharge: Over 30 minutes  Richarda Osmond, MD  [facility] 05/10/2021, 12:08 PM
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
## 2021-05-10 NOTE — Evaluation (Signed)
Physical Therapy Evaluation Patient Details Name: Brenda Barber MRN: [id number] DOB: [birth year] Today's Date: 05/10/2021  History of Present Illness  Pt is a 65 y.o. female with medical history significant for atrial fibrillation, on Eliquis, hypertension, dyslipidemia, hypothyroidism and sleep apnea on CPAP, who presented to the ER with acute onset of cough productive of clear sputum as well as chest congestion, wheezing and dyspnea. Workup +for flu.   Clinical Impression  Pt alert, agreeable to PT. Pt reported at baseline she lives with a roommate (with 8 inside cats), uses a RW intermittently for community ambulations, drives, performs IADLs.  The patient was placed on room air at start of session, and monitored continuously. 90% or greater throughout mobility, RN notified and left on room air at start of session. She performed bed mobility modI, sit <> stand transfers with supervision with and without RW, and ambulated ~17ft with RW, supervision. Several PT led rest breaks, mild dypnea noted. Returned to room, able to complete room navigation without RW, no LOB. The patient demonstrated and reported near return to baseline level of functioning, no further acute PT needs indicated. PT to sign off. Please reconsult PT if pt status changes or acute needs are identified.         Recommendations for follow up therapy are one component of a multi-disciplinary discharge planning process, led by the attending physician.  Recommendations may be updated based on patient status, additional functional criteria and insurance authorization.  Follow Up Recommendations No PT follow up    Assistance Recommended at Discharge PRN  Functional Status Assessment Patient has not had a recent decline in their functional status  Equipment Recommendations  None recommended by PT    Recommendations for Other Services       Precautions / Restrictions Precautions Precautions: Fall Restrictions Weight Bearing  Restrictions: No      Mobility  Bed Mobility Overal bed mobility: Modified Independent                  Transfers Overall transfer level: Needs assistance Equipment used: Rolling walker (2 wheels);None Transfers: Sit to/from Stand Sit to Stand: Supervision                Ambulation/Gait Ambulation/Gait assistance: Supervision Gait Distance (Feet): 175 Feet Assistive device: Rolling walker (2 wheels);None   Gait velocity: decreased     General Gait Details: in room ambulation without RW, but for longer distance in hallway, utilized RW. supervision, cued for standing rest breaks by PT. mildl dyspnea noted.  Stairs            Wheelchair Mobility    Modified Rankin (Stroke Patients Only)       Balance Overall balance assessment: Needs assistance Sitting-balance support: Feet supported Sitting balance-Leahy Scale: Normal       Standing balance-Leahy Scale: Good                               Pertinent Vitals/Pain Pain Assessment: No/denies pain    Home Living Family/patient expects to be discharged to:: Private residence Living Arrangements: Other (Comment) (roommate)   Type of Home: House Home Access: Stairs to enter;Ramped entrance Entrance Stairs-Rails: Right Entrance Stairs-Number of Steps: 4   Home Layout: One level Home Equipment: Cane - single Barista (2 wheels);Shower seat;Hand held shower head Additional Comments: has had two falls, tripping over item in home, one loose stair; drives locally still    Prior Function  Prior Level of Function : Independent/Modified Independent                     Hand Dominance   Dominant Hand: Right    Extremity/Trunk Assessment   Upper Extremity Assessment Upper Extremity Assessment: Overall WFL for tasks assessed    Lower Extremity Assessment Lower Extremity Assessment: Overall WFL for tasks assessed    Cervical / Trunk Assessment Cervical / Trunk  Assessment: Normal  Communication   Communication: No difficulties  Cognition Arousal/Alertness: Awake/alert Behavior During Therapy: WFL for tasks assessed/performed Overall Cognitive Status: Within Functional Limits for tasks assessed                                          General Comments      Exercises     Assessment/Plan    PT Assessment Patient needs continued PT services;Patient does not need any further PT services  PT Problem List         PT Treatment Interventions Gait training;Therapeutic activities;Therapeutic exercise    PT Goals (Current goals can be found in the Care Plan section)       Frequency     Barriers to discharge        Co-evaluation               AM-PAC PT "6 Clicks" Mobility  Outcome Measure Help needed turning from your back to your side while in a flat bed without using bedrails?: None Help needed moving from lying on your back to sitting on the side of a flat bed without using bedrails?: None Help needed moving to and from a bed to a chair (including a wheelchair)?: None Help needed standing up from a chair using your arms (e.g., wheelchair or bedside chair)?: None Help needed to walk in hospital room?: A Little Help needed climbing 3-5 steps with a railing? : A Little 6 Click Score: 22    End of Session   Activity Tolerance: Patient tolerated treatment well Patient left: in chair;with chair alarm set;with call bell/phone within reach Nurse Communication: Mobility status;Other (comment) (O2 status) PT Visit Diagnosis: Other abnormalities of gait and mobility (R26.89);Muscle weakness (generalized) (M62.81)    Time: 3762-8315 PT Time Calculation (min) (ACUTE ONLY): 19 min   Charges:   PT Evaluation $PT Eval Low Complexity: 1 Low PT Treatments $Therapeutic Activity: 8-22 mins        Lieutenant Diego PT, DPT 10:11 AM,05/10/21

## 2021-05-18 ENCOUNTER — Other Ambulatory Visit: Payer: Self-pay | Admitting: Internal Medicine

## 2021-05-18 DIAGNOSIS — D509 Iron deficiency anemia, unspecified: Secondary | ICD-10-CM | POA: Diagnosis not present

## 2021-05-18 DIAGNOSIS — Z1231 Encounter for screening mammogram for malignant neoplasm of breast: Secondary | ICD-10-CM | POA: Diagnosis not present

## 2021-05-18 DIAGNOSIS — R918 Other nonspecific abnormal finding of lung field: Secondary | ICD-10-CM | POA: Diagnosis not present

## 2021-05-18 DIAGNOSIS — G935 Compression of brain: Secondary | ICD-10-CM | POA: Diagnosis not present

## 2021-05-18 DIAGNOSIS — J449 Chronic obstructive pulmonary disease, unspecified: Secondary | ICD-10-CM | POA: Diagnosis not present

## 2021-05-18 DIAGNOSIS — Z85850 Personal history of malignant neoplasm of thyroid: Secondary | ICD-10-CM | POA: Diagnosis not present

## 2021-05-18 DIAGNOSIS — I480 Paroxysmal atrial fibrillation: Secondary | ICD-10-CM | POA: Diagnosis not present

## 2021-05-18 DIAGNOSIS — Z09 Encounter for follow-up examination after completed treatment for conditions other than malignant neoplasm: Secondary | ICD-10-CM | POA: Diagnosis not present

## 2021-05-18 DIAGNOSIS — Z720 Tobacco use: Secondary | ICD-10-CM | POA: Diagnosis not present

## 2021-05-21 DIAGNOSIS — C3432 Malignant neoplasm of lower lobe, left bronchus or lung: Secondary | ICD-10-CM

## 2021-05-21 DIAGNOSIS — Z923 Personal history of irradiation: Secondary | ICD-10-CM

## 2021-05-21 HISTORY — DX: Personal history of irradiation: Z92.3

## 2021-05-21 HISTORY — DX: Malignant neoplasm of lower lobe, left bronchus or lung: C34.32

## 2021-05-26 DIAGNOSIS — R071 Chest pain on breathing: Secondary | ICD-10-CM | POA: Diagnosis not present

## 2021-05-26 DIAGNOSIS — J45998 Other asthma: Secondary | ICD-10-CM | POA: Diagnosis not present

## 2021-05-26 DIAGNOSIS — E785 Hyperlipidemia, unspecified: Secondary | ICD-10-CM | POA: Diagnosis not present

## 2021-05-26 DIAGNOSIS — I4891 Unspecified atrial fibrillation: Secondary | ICD-10-CM | POA: Diagnosis not present

## 2021-05-26 DIAGNOSIS — I10 Essential (primary) hypertension: Secondary | ICD-10-CM | POA: Diagnosis not present

## 2021-05-26 DIAGNOSIS — I2510 Atherosclerotic heart disease of native coronary artery without angina pectoris: Secondary | ICD-10-CM | POA: Diagnosis not present

## 2021-05-30 DIAGNOSIS — R8290 Unspecified abnormal findings in urine: Secondary | ICD-10-CM | POA: Diagnosis not present

## 2021-05-30 DIAGNOSIS — N1832 Chronic kidney disease, stage 3b: Secondary | ICD-10-CM | POA: Diagnosis not present

## 2021-05-30 DIAGNOSIS — E785 Hyperlipidemia, unspecified: Secondary | ICD-10-CM | POA: Diagnosis not present

## 2021-05-30 DIAGNOSIS — I639 Cerebral infarction, unspecified: Secondary | ICD-10-CM | POA: Diagnosis not present

## 2021-05-30 DIAGNOSIS — G4733 Obstructive sleep apnea (adult) (pediatric): Secondary | ICD-10-CM | POA: Diagnosis not present

## 2021-05-30 DIAGNOSIS — I10 Essential (primary) hypertension: Secondary | ICD-10-CM | POA: Diagnosis not present

## 2021-06-05 DIAGNOSIS — R0789 Other chest pain: Secondary | ICD-10-CM | POA: Diagnosis not present

## 2021-06-08 DIAGNOSIS — G4733 Obstructive sleep apnea (adult) (pediatric): Secondary | ICD-10-CM | POA: Diagnosis not present

## 2021-06-08 DIAGNOSIS — I639 Cerebral infarction, unspecified: Secondary | ICD-10-CM | POA: Diagnosis not present

## 2021-06-08 DIAGNOSIS — N1832 Chronic kidney disease, stage 3b: Secondary | ICD-10-CM | POA: Diagnosis not present

## 2021-06-08 DIAGNOSIS — E785 Hyperlipidemia, unspecified: Secondary | ICD-10-CM | POA: Diagnosis not present

## 2021-06-08 DIAGNOSIS — R809 Proteinuria, unspecified: Secondary | ICD-10-CM | POA: Diagnosis not present

## 2021-06-08 DIAGNOSIS — I10 Essential (primary) hypertension: Secondary | ICD-10-CM | POA: Diagnosis not present

## 2021-06-08 DIAGNOSIS — D631 Anemia in chronic kidney disease: Secondary | ICD-10-CM | POA: Diagnosis not present

## 2021-06-09 DIAGNOSIS — I2510 Atherosclerotic heart disease of native coronary artery without angina pectoris: Secondary | ICD-10-CM | POA: Diagnosis not present

## 2021-06-09 DIAGNOSIS — I10 Essential (primary) hypertension: Secondary | ICD-10-CM | POA: Diagnosis not present

## 2021-06-09 DIAGNOSIS — I340 Nonrheumatic mitral (valve) insufficiency: Secondary | ICD-10-CM | POA: Diagnosis not present

## 2021-06-09 DIAGNOSIS — R0602 Shortness of breath: Secondary | ICD-10-CM | POA: Diagnosis not present

## 2021-06-09 DIAGNOSIS — J45998 Other asthma: Secondary | ICD-10-CM | POA: Diagnosis not present

## 2021-06-09 DIAGNOSIS — E785 Hyperlipidemia, unspecified: Secondary | ICD-10-CM | POA: Diagnosis not present

## 2021-06-09 DIAGNOSIS — I4891 Unspecified atrial fibrillation: Secondary | ICD-10-CM | POA: Diagnosis not present

## 2021-07-10 DIAGNOSIS — R0602 Shortness of breath: Secondary | ICD-10-CM | POA: Diagnosis not present

## 2021-07-10 DIAGNOSIS — I2510 Atherosclerotic heart disease of native coronary artery without angina pectoris: Secondary | ICD-10-CM | POA: Diagnosis not present

## 2021-07-10 DIAGNOSIS — I4891 Unspecified atrial fibrillation: Secondary | ICD-10-CM | POA: Diagnosis not present

## 2021-07-10 DIAGNOSIS — I10 Essential (primary) hypertension: Secondary | ICD-10-CM | POA: Diagnosis not present

## 2021-07-10 DIAGNOSIS — E785 Hyperlipidemia, unspecified: Secondary | ICD-10-CM | POA: Diagnosis not present

## 2021-07-10 DIAGNOSIS — J45998 Other asthma: Secondary | ICD-10-CM | POA: Diagnosis not present

## 2021-07-24 ENCOUNTER — Ambulatory Visit: Admission: RE | Admit: 2021-07-24 | Payer: Medicare HMO | Source: Ambulatory Visit

## 2021-07-26 DIAGNOSIS — N1832 Chronic kidney disease, stage 3b: Secondary | ICD-10-CM | POA: Diagnosis not present

## 2021-07-26 DIAGNOSIS — R911 Solitary pulmonary nodule: Secondary | ICD-10-CM | POA: Diagnosis not present

## 2021-07-26 DIAGNOSIS — G935 Compression of brain: Secondary | ICD-10-CM | POA: Diagnosis not present

## 2021-07-26 DIAGNOSIS — I10 Essential (primary) hypertension: Secondary | ICD-10-CM | POA: Diagnosis not present

## 2021-07-26 DIAGNOSIS — J432 Centrilobular emphysema: Secondary | ICD-10-CM | POA: Diagnosis not present

## 2021-07-26 DIAGNOSIS — Z79899 Other long term (current) drug therapy: Secondary | ICD-10-CM | POA: Diagnosis not present

## 2021-07-26 DIAGNOSIS — I700 Atherosclerosis of aorta: Secondary | ICD-10-CM | POA: Diagnosis not present

## 2021-07-26 DIAGNOSIS — R413 Other amnesia: Secondary | ICD-10-CM | POA: Diagnosis not present

## 2021-07-26 DIAGNOSIS — R7309 Other abnormal glucose: Secondary | ICD-10-CM | POA: Diagnosis not present

## 2021-08-02 ENCOUNTER — Ambulatory Visit
Admission: RE | Admit: 2021-08-02 | Discharge: 2021-08-02 | Disposition: A | Discharge status: Home / Self Care | Payer: Medicare HMO | Source: Ambulatory Visit | Attending: Specialist | Admitting: Specialist

## 2021-08-02 ENCOUNTER — Other Ambulatory Visit: Payer: Self-pay

## 2021-08-02 DIAGNOSIS — I700 Atherosclerosis of aorta: Secondary | ICD-10-CM | POA: Diagnosis not present

## 2021-08-02 DIAGNOSIS — R918 Other nonspecific abnormal finding of lung field: Secondary | ICD-10-CM | POA: Insufficient documentation

## 2021-08-02 DIAGNOSIS — E892 Postprocedural hypoparathyroidism: Secondary | ICD-10-CM | POA: Diagnosis not present

## 2021-08-02 DIAGNOSIS — Z85850 Personal history of malignant neoplasm of thyroid: Secondary | ICD-10-CM | POA: Diagnosis not present

## 2021-08-02 DIAGNOSIS — N1832 Chronic kidney disease, stage 3b: Secondary | ICD-10-CM | POA: Diagnosis not present

## 2021-08-02 DIAGNOSIS — E890 Postprocedural hypothyroidism: Secondary | ICD-10-CM | POA: Diagnosis not present

## 2021-08-03 DIAGNOSIS — I700 Atherosclerosis of aorta: Secondary | ICD-10-CM | POA: Diagnosis not present

## 2021-08-03 DIAGNOSIS — J439 Emphysema, unspecified: Secondary | ICD-10-CM | POA: Diagnosis not present

## 2021-08-03 DIAGNOSIS — Z1389 Encounter for screening for other disorder: Secondary | ICD-10-CM | POA: Diagnosis not present

## 2021-08-03 DIAGNOSIS — Z1231 Encounter for screening mammogram for malignant neoplasm of breast: Secondary | ICD-10-CM | POA: Diagnosis not present

## 2021-08-03 DIAGNOSIS — Z0000 Encounter for general adult medical examination without abnormal findings: Secondary | ICD-10-CM | POA: Diagnosis not present

## 2021-08-03 DIAGNOSIS — Z79899 Other long term (current) drug therapy: Secondary | ICD-10-CM | POA: Diagnosis not present

## 2021-08-03 DIAGNOSIS — E892 Postprocedural hypoparathyroidism: Secondary | ICD-10-CM | POA: Diagnosis not present

## 2021-08-03 DIAGNOSIS — Z13820 Encounter for screening for osteoporosis: Secondary | ICD-10-CM | POA: Diagnosis not present

## 2021-08-03 DIAGNOSIS — N1832 Chronic kidney disease, stage 3b: Secondary | ICD-10-CM | POA: Diagnosis not present

## 2021-08-03 DIAGNOSIS — G935 Compression of brain: Secondary | ICD-10-CM | POA: Diagnosis not present

## 2021-08-03 DIAGNOSIS — R911 Solitary pulmonary nodule: Secondary | ICD-10-CM | POA: Diagnosis not present

## 2021-08-08 DIAGNOSIS — R053 Chronic cough: Secondary | ICD-10-CM | POA: Diagnosis not present

## 2021-08-08 DIAGNOSIS — R918 Other nonspecific abnormal finding of lung field: Secondary | ICD-10-CM | POA: Diagnosis not present

## 2021-08-15 ENCOUNTER — Other Ambulatory Visit: Payer: Self-pay | Admitting: Specialist

## 2021-08-15 ENCOUNTER — Other Ambulatory Visit (HOSPITAL_COMMUNITY): Payer: Self-pay | Admitting: Specialist

## 2021-08-15 DIAGNOSIS — R918 Other nonspecific abnormal finding of lung field: Secondary | ICD-10-CM

## 2021-08-16 DIAGNOSIS — R413 Other amnesia: Secondary | ICD-10-CM | POA: Diagnosis not present

## 2021-08-16 DIAGNOSIS — R2689 Other abnormalities of gait and mobility: Secondary | ICD-10-CM | POA: Diagnosis not present

## 2021-08-16 DIAGNOSIS — M8588 Other specified disorders of bone density and structure, other site: Secondary | ICD-10-CM | POA: Diagnosis not present

## 2021-08-16 DIAGNOSIS — G479 Sleep disorder, unspecified: Secondary | ICD-10-CM | POA: Diagnosis not present

## 2021-09-01 ENCOUNTER — Ambulatory Visit (HOSPITAL_COMMUNITY)
Admission: RE | Admit: 2021-09-01 | Discharge: 2021-09-01 | Disposition: A | Discharge status: Home / Self Care | Payer: Medicare HMO | Source: Ambulatory Visit | Attending: Specialist | Admitting: Specialist

## 2021-09-01 DIAGNOSIS — K5730 Diverticulosis of large intestine without perforation or abscess without bleeding: Secondary | ICD-10-CM | POA: Diagnosis not present

## 2021-09-01 DIAGNOSIS — I700 Atherosclerosis of aorta: Secondary | ICD-10-CM | POA: Insufficient documentation

## 2021-09-01 DIAGNOSIS — I2510 Atherosclerotic heart disease of native coronary artery without angina pectoris: Secondary | ICD-10-CM | POA: Diagnosis not present

## 2021-09-01 DIAGNOSIS — R918 Other nonspecific abnormal finding of lung field: Secondary | ICD-10-CM | POA: Diagnosis not present

## 2021-09-01 DIAGNOSIS — M1612 Unilateral primary osteoarthritis, left hip: Secondary | ICD-10-CM | POA: Diagnosis not present

## 2021-09-01 DIAGNOSIS — N200 Calculus of kidney: Secondary | ICD-10-CM | POA: Diagnosis not present

## 2021-09-01 LAB — GLUCOSE, CAPILLARY: Glucose-Capillary: 93 mg/dL (ref 70–99)

## 2021-09-01 MED ORDER — FLUDEOXYGLUCOSE F - 18 (FDG) INJECTION
7.8000 | Freq: Once | INTRAVENOUS | Status: AC
  Administered 2021-09-01: 7.43 via INTRAVENOUS

## 2021-09-11 ENCOUNTER — Ambulatory Visit
Admission: RE | Admit: 2021-09-11 | Discharge: 2021-09-11 | Disposition: A | Discharge status: Home / Self Care | Payer: Medicare HMO | Source: Ambulatory Visit | Attending: Internal Medicine | Admitting: Internal Medicine

## 2021-09-11 DIAGNOSIS — Z1231 Encounter for screening mammogram for malignant neoplasm of breast: Secondary | ICD-10-CM | POA: Diagnosis not present

## 2021-09-12 ENCOUNTER — Other Ambulatory Visit: Payer: Self-pay | Admitting: *Deleted

## 2021-09-12 DIAGNOSIS — R918 Other nonspecific abnormal finding of lung field: Secondary | ICD-10-CM | POA: Diagnosis not present

## 2021-09-12 NOTE — Progress Notes (Signed)
Per Dr. Lanney Gins, pt needs to be added to tumor board for radiology review. ?

## 2021-09-13 ENCOUNTER — Other Ambulatory Visit: Payer: Self-pay | Admitting: Internal Medicine

## 2021-09-13 DIAGNOSIS — N6489 Other specified disorders of breast: Secondary | ICD-10-CM

## 2021-09-13 DIAGNOSIS — R928 Other abnormal and inconclusive findings on diagnostic imaging of breast: Secondary | ICD-10-CM

## 2021-09-14 ENCOUNTER — Encounter: Payer: Self-pay | Admitting: *Deleted

## 2021-09-14 NOTE — Progress Notes (Signed)
Pt presented at tumor board today to discuss recent PET scan findings. Recommended to pursue biopsy of the left lower lobe lung nodule with robotics assisted bronchoscopy. Dr. Lanney Gins made aware via secure chat.  ?

## 2021-09-28 ENCOUNTER — Ambulatory Visit
Admission: RE | Admit: 2021-09-28 | Discharge: 2021-09-28 | Disposition: A | Discharge status: Home / Self Care | Payer: Medicare HMO | Source: Ambulatory Visit | Attending: Internal Medicine | Admitting: Internal Medicine

## 2021-09-28 DIAGNOSIS — R928 Other abnormal and inconclusive findings on diagnostic imaging of breast: Secondary | ICD-10-CM | POA: Insufficient documentation

## 2021-09-28 DIAGNOSIS — N6489 Other specified disorders of breast: Secondary | ICD-10-CM | POA: Insufficient documentation

## 2021-10-06 DIAGNOSIS — J310 Chronic rhinitis: Secondary | ICD-10-CM | POA: Diagnosis not present

## 2021-10-06 DIAGNOSIS — G4733 Obstructive sleep apnea (adult) (pediatric): Secondary | ICD-10-CM | POA: Diagnosis not present

## 2021-10-06 DIAGNOSIS — R9389 Abnormal findings on diagnostic imaging of other specified body structures: Secondary | ICD-10-CM | POA: Diagnosis not present

## 2021-11-07 DIAGNOSIS — E785 Hyperlipidemia, unspecified: Secondary | ICD-10-CM | POA: Diagnosis not present

## 2021-11-07 DIAGNOSIS — I2510 Atherosclerotic heart disease of native coronary artery without angina pectoris: Secondary | ICD-10-CM | POA: Diagnosis not present

## 2021-11-07 DIAGNOSIS — I340 Nonrheumatic mitral (valve) insufficiency: Secondary | ICD-10-CM | POA: Diagnosis not present

## 2021-11-07 DIAGNOSIS — I4891 Unspecified atrial fibrillation: Secondary | ICD-10-CM | POA: Diagnosis not present

## 2021-11-07 DIAGNOSIS — J45998 Other asthma: Secondary | ICD-10-CM | POA: Diagnosis not present

## 2021-11-07 DIAGNOSIS — I10 Essential (primary) hypertension: Secondary | ICD-10-CM | POA: Diagnosis not present

## 2021-11-10 DIAGNOSIS — H04123 Dry eye syndrome of bilateral lacrimal glands: Secondary | ICD-10-CM | POA: Diagnosis not present

## 2021-11-10 DIAGNOSIS — Z0100 Encounter for examination of eyes and vision without abnormal findings: Secondary | ICD-10-CM | POA: Diagnosis not present

## 2021-11-10 DIAGNOSIS — Z961 Presence of intraocular lens: Secondary | ICD-10-CM | POA: Diagnosis not present

## 2021-11-10 DIAGNOSIS — H35342 Macular cyst, hole, or pseudohole, left eye: Secondary | ICD-10-CM | POA: Diagnosis not present

## 2021-11-10 DIAGNOSIS — H35371 Puckering of macula, right eye: Secondary | ICD-10-CM | POA: Diagnosis not present

## 2021-11-28 DIAGNOSIS — Z79899 Other long term (current) drug therapy: Secondary | ICD-10-CM | POA: Diagnosis not present

## 2021-11-28 DIAGNOSIS — N1832 Chronic kidney disease, stage 3b: Secondary | ICD-10-CM | POA: Diagnosis not present

## 2021-11-28 DIAGNOSIS — D631 Anemia in chronic kidney disease: Secondary | ICD-10-CM | POA: Diagnosis not present

## 2021-11-28 DIAGNOSIS — I639 Cerebral infarction, unspecified: Secondary | ICD-10-CM | POA: Diagnosis not present

## 2021-11-28 DIAGNOSIS — D649 Anemia, unspecified: Secondary | ICD-10-CM | POA: Diagnosis not present

## 2021-11-28 DIAGNOSIS — R7309 Other abnormal glucose: Secondary | ICD-10-CM | POA: Diagnosis not present

## 2021-11-28 DIAGNOSIS — R809 Proteinuria, unspecified: Secondary | ICD-10-CM | POA: Diagnosis not present

## 2021-11-28 DIAGNOSIS — G4733 Obstructive sleep apnea (adult) (pediatric): Secondary | ICD-10-CM | POA: Diagnosis not present

## 2021-11-28 DIAGNOSIS — E892 Postprocedural hypoparathyroidism: Secondary | ICD-10-CM | POA: Diagnosis not present

## 2021-11-28 DIAGNOSIS — I10 Essential (primary) hypertension: Secondary | ICD-10-CM | POA: Diagnosis not present

## 2021-11-28 DIAGNOSIS — E785 Hyperlipidemia, unspecified: Secondary | ICD-10-CM | POA: Diagnosis not present

## 2021-12-05 DIAGNOSIS — Q0700 Arnold-Chiari syndrome without spina bifida or hydrocephalus: Secondary | ICD-10-CM | POA: Diagnosis not present

## 2021-12-05 DIAGNOSIS — Z79899 Other long term (current) drug therapy: Secondary | ICD-10-CM | POA: Diagnosis not present

## 2021-12-05 DIAGNOSIS — M8580 Other specified disorders of bone density and structure, unspecified site: Secondary | ICD-10-CM | POA: Diagnosis not present

## 2021-12-05 DIAGNOSIS — G4733 Obstructive sleep apnea (adult) (pediatric): Secondary | ICD-10-CM | POA: Diagnosis not present

## 2021-12-05 DIAGNOSIS — R918 Other nonspecific abnormal finding of lung field: Secondary | ICD-10-CM | POA: Diagnosis not present

## 2021-12-05 DIAGNOSIS — Z85850 Personal history of malignant neoplasm of thyroid: Secondary | ICD-10-CM | POA: Diagnosis not present

## 2021-12-05 DIAGNOSIS — J439 Emphysema, unspecified: Secondary | ICD-10-CM | POA: Diagnosis not present

## 2021-12-05 DIAGNOSIS — D509 Iron deficiency anemia, unspecified: Secondary | ICD-10-CM | POA: Diagnosis not present

## 2021-12-05 DIAGNOSIS — N184 Chronic kidney disease, stage 4 (severe): Secondary | ICD-10-CM | POA: Diagnosis not present

## 2021-12-07 DIAGNOSIS — G4733 Obstructive sleep apnea (adult) (pediatric): Secondary | ICD-10-CM | POA: Diagnosis not present

## 2021-12-07 DIAGNOSIS — I639 Cerebral infarction, unspecified: Secondary | ICD-10-CM | POA: Diagnosis not present

## 2021-12-07 DIAGNOSIS — N1832 Chronic kidney disease, stage 3b: Secondary | ICD-10-CM | POA: Diagnosis not present

## 2021-12-07 DIAGNOSIS — R8290 Unspecified abnormal findings in urine: Secondary | ICD-10-CM | POA: Diagnosis not present

## 2021-12-07 DIAGNOSIS — I10 Essential (primary) hypertension: Secondary | ICD-10-CM | POA: Diagnosis not present

## 2021-12-07 DIAGNOSIS — E785 Hyperlipidemia, unspecified: Secondary | ICD-10-CM | POA: Diagnosis not present

## 2021-12-07 DIAGNOSIS — R809 Proteinuria, unspecified: Secondary | ICD-10-CM | POA: Diagnosis not present

## 2021-12-07 DIAGNOSIS — D631 Anemia in chronic kidney disease: Secondary | ICD-10-CM | POA: Diagnosis not present

## 2021-12-08 DIAGNOSIS — I4891 Unspecified atrial fibrillation: Secondary | ICD-10-CM | POA: Diagnosis not present

## 2021-12-08 DIAGNOSIS — E119 Type 2 diabetes mellitus without complications: Secondary | ICD-10-CM | POA: Diagnosis not present

## 2021-12-08 DIAGNOSIS — I480 Paroxysmal atrial fibrillation: Secondary | ICD-10-CM | POA: Diagnosis not present

## 2021-12-08 DIAGNOSIS — I340 Nonrheumatic mitral (valve) insufficiency: Secondary | ICD-10-CM | POA: Diagnosis not present

## 2021-12-08 DIAGNOSIS — J45998 Other asthma: Secondary | ICD-10-CM | POA: Diagnosis not present

## 2021-12-08 DIAGNOSIS — I10 Essential (primary) hypertension: Secondary | ICD-10-CM | POA: Diagnosis not present

## 2021-12-08 DIAGNOSIS — E785 Hyperlipidemia, unspecified: Secondary | ICD-10-CM | POA: Diagnosis not present

## 2021-12-08 DIAGNOSIS — I2510 Atherosclerotic heart disease of native coronary artery without angina pectoris: Secondary | ICD-10-CM | POA: Diagnosis not present

## 2021-12-26 DIAGNOSIS — E785 Hyperlipidemia, unspecified: Secondary | ICD-10-CM | POA: Diagnosis not present

## 2021-12-26 DIAGNOSIS — R809 Proteinuria, unspecified: Secondary | ICD-10-CM | POA: Diagnosis not present

## 2021-12-26 DIAGNOSIS — D631 Anemia in chronic kidney disease: Secondary | ICD-10-CM | POA: Diagnosis not present

## 2021-12-26 DIAGNOSIS — G4733 Obstructive sleep apnea (adult) (pediatric): Secondary | ICD-10-CM | POA: Diagnosis not present

## 2021-12-26 DIAGNOSIS — I639 Cerebral infarction, unspecified: Secondary | ICD-10-CM | POA: Diagnosis not present

## 2021-12-26 DIAGNOSIS — R8290 Unspecified abnormal findings in urine: Secondary | ICD-10-CM | POA: Diagnosis not present

## 2021-12-26 DIAGNOSIS — I10 Essential (primary) hypertension: Secondary | ICD-10-CM | POA: Diagnosis not present

## 2021-12-26 DIAGNOSIS — N1832 Chronic kidney disease, stage 3b: Secondary | ICD-10-CM | POA: Diagnosis not present

## 2021-12-27 ENCOUNTER — Other Ambulatory Visit: Payer: Self-pay | Admitting: Pulmonary Disease

## 2021-12-27 ENCOUNTER — Encounter
Admission: RE | Admit: 2021-12-27 | Discharge: 2021-12-27 | Disposition: A | Discharge status: Home / Self Care | Payer: Medicare HMO | Source: Ambulatory Visit | Attending: Pulmonary Disease | Admitting: Pulmonary Disease

## 2021-12-27 ENCOUNTER — Other Ambulatory Visit: Payer: Self-pay

## 2021-12-27 DIAGNOSIS — Z01812 Encounter for preprocedural laboratory examination: Secondary | ICD-10-CM

## 2021-12-27 DIAGNOSIS — R918 Other nonspecific abnormal finding of lung field: Secondary | ICD-10-CM

## 2021-12-27 HISTORY — DX: Prediabetes: R73.03

## 2021-12-27 HISTORY — DX: Chronic obstructive pulmonary disease, unspecified: J44.9

## 2021-12-27 HISTORY — DX: Pneumonia, unspecified organism: J18.9

## 2021-12-27 NOTE — Patient Instructions (Addendum)
Your procedure is scheduled on: 12/29/21 - Friday Report to the Registration Desk on the 1st floor of the [facility]. To find out your arrival time, please call [phone] between 1PM - 3PM on: 12/28/21 - Thursday If your arrival time is 6:00 am, do not arrive prior to that time as the [place] entrance doors do not open until 6:00 am.  REMEMBER: Instructions that are not followed completely may result in serious medical risk, up to and including death; or upon the discretion of your surgeon and anesthesiologist your surgery may need to be rescheduled.  Do not eat food or drinking any fluid after midnight the night before surgery.  No gum chewing, lozengers or hard candies.   TAKE THESE MEDICATIONS THE MORNING OF SURGERY WITH A SIP OF WATER:  - diltiazem (TIAZAC)  - gemfibrozil (LOPID) 600 MG tablet - iron polysaccharides (NIFEREX) 150  - levothyroxine (SYNTHROID)  - MULTAQ 400 MG tablet - potassium chloride (KLOR-CON) 10 MEQ  - sucralfate (CARAFATE) - isosorbide mononitrate (IMDUR)    STOP apixaban (ELIQUIS) beginning 12/26/21, resume taking with doctor order.  One week prior to surgery: Stop Anti-inflammatories (NSAIDS) such as Advil, Aleve, Ibuprofen, Motrin, Naproxen, Naprosyn and Aspirin based products such as Excedrin, Goodys Powder, BC Powder.  Stop ANY OVER THE COUNTER supplements until after surgery.  You may however, continue to take Tylenol if needed for pain up until the day of surgery.  No Alcohol for 24 hours before or after surgery.  No Smoking including e-cigarettes for 24 hours prior to surgery.  No chewable tobacco products for at least 6 hours prior to surgery.  No nicotine patches on the day of surgery.  Do not use any "recreational" drugs for at least a week prior to your surgery.  Please be advised that the combination of cocaine and anesthesia may have negative outcomes, up to and including death. If you test positive for cocaine, your surgery  will be cancelled.  On the morning of surgery brush your teeth with toothpaste and water, you may rinse your mouth with mouthwash if you wish. Do not swallow any toothpaste or mouthwash.  Do not wear jewelry, make-up, hairpins, clips or nail polish.  Do not wear lotions, powders, or perfumes.   Do not shave body from the neck down 48 hours prior to surgery just in case you cut yourself which could leave a site for infection.  Also, freshly shaved skin may become irritated if using the CHG soap.  Contact lenses, hearing aids and dentures may not be worn into surgery.  Do not bring valuables to the hospital. [facility] is not responsible for any missing/lost belongings or valuables.   Bring your C-PAP to the hospital with you in case you may have to spend the night.   Notify your doctor if there is any change in your medical condition (cold, fever, infection).  Wear comfortable clothing (specific to your surgery type) to the hospital.  After surgery, you can help prevent lung complications by doing breathing exercises.  Take deep breaths and cough every 1-2 hours. Your doctor may order a device called an Incentive Spirometer to help you take deep breaths. When coughing or sneezing, hold a pillow firmly against your incision with both hands. This is called "splinting." Doing this helps protect your incision. It also decreases belly discomfort.  If you are being admitted to the hospital overnight, leave your suitcase in the car. After surgery it may be brought to your room.  If you are being discharged the day of surgery, you will not be allowed to drive home. You will need a responsible adult (18 years or older) to drive you home and stay with you that night.   If you are taking public transportation, you will need to have a responsible adult (18 years or older) with you. Please confirm with your physician that it is acceptable to use public transportation.   Please call the  [place] Dept. at [phone] if you have any questions about these instructions.  Surgery Visitation Policy:  Patients undergoing a surgery or procedure may have two family members or support persons with them as [place] as the person is not COVID-19 positive or experiencing its symptoms.   Inpatient Visitation:    Visiting hours are 7 a.m. to 8 p.m. Up to four visitors are allowed at one time in a patient room, including children. The visitors may rotate out with other people during the day. One designated support person (adult) may remain overnight.

## 2021-12-28 ENCOUNTER — Inpatient Hospital Stay: Admission: RE | Admit: 2021-12-28 | Payer: Medicare HMO | Source: Ambulatory Visit

## 2022-01-01 ENCOUNTER — Ambulatory Visit
Admission: RE | Admit: 2022-01-01 | Discharge: 2022-01-01 | Disposition: A | Discharge status: Home / Self Care | Payer: Medicare HMO | Source: Ambulatory Visit | Attending: Pulmonary Disease | Admitting: Pulmonary Disease

## 2022-01-01 DIAGNOSIS — R918 Other nonspecific abnormal finding of lung field: Secondary | ICD-10-CM | POA: Diagnosis not present

## 2022-01-04 DIAGNOSIS — D631 Anemia in chronic kidney disease: Secondary | ICD-10-CM | POA: Diagnosis not present

## 2022-01-04 DIAGNOSIS — I639 Cerebral infarction, unspecified: Secondary | ICD-10-CM | POA: Diagnosis not present

## 2022-01-04 DIAGNOSIS — E785 Hyperlipidemia, unspecified: Secondary | ICD-10-CM | POA: Diagnosis not present

## 2022-01-04 DIAGNOSIS — R809 Proteinuria, unspecified: Secondary | ICD-10-CM | POA: Diagnosis not present

## 2022-01-04 DIAGNOSIS — I10 Essential (primary) hypertension: Secondary | ICD-10-CM | POA: Diagnosis not present

## 2022-01-04 DIAGNOSIS — R8290 Unspecified abnormal findings in urine: Secondary | ICD-10-CM | POA: Diagnosis not present

## 2022-01-04 DIAGNOSIS — G4733 Obstructive sleep apnea (adult) (pediatric): Secondary | ICD-10-CM | POA: Diagnosis not present

## 2022-01-04 DIAGNOSIS — N1832 Chronic kidney disease, stage 3b: Secondary | ICD-10-CM | POA: Diagnosis not present

## 2022-01-05 DIAGNOSIS — J301 Allergic rhinitis due to pollen: Secondary | ICD-10-CM | POA: Diagnosis not present

## 2022-01-05 DIAGNOSIS — R911 Solitary pulmonary nodule: Secondary | ICD-10-CM | POA: Diagnosis not present

## 2022-01-05 DIAGNOSIS — J4520 Mild intermittent asthma, uncomplicated: Secondary | ICD-10-CM | POA: Diagnosis not present

## 2022-01-10 ENCOUNTER — Encounter
Admission: RE | Admit: 2022-01-10 | Discharge: 2022-01-10 | Disposition: A | Discharge status: Home / Self Care | Payer: Medicare HMO | Source: Ambulatory Visit | Attending: Pulmonary Disease | Admitting: Pulmonary Disease

## 2022-01-10 DIAGNOSIS — Z20822 Contact with and (suspected) exposure to covid-19: Secondary | ICD-10-CM | POA: Insufficient documentation

## 2022-01-10 DIAGNOSIS — Z01812 Encounter for preprocedural laboratory examination: Secondary | ICD-10-CM

## 2022-01-10 DIAGNOSIS — Z01818 Encounter for other preprocedural examination: Secondary | ICD-10-CM | POA: Insufficient documentation

## 2022-01-10 LAB — CBC
HCT: 32.3 % — ABNORMAL LOW (ref 36.0–46.0)
Hemoglobin: 10.1 g/dL — ABNORMAL LOW (ref 12.0–15.0)
MCH: 29.8 pg (ref 26.0–34.0)
MCHC: 31.3 g/dL (ref 30.0–36.0)
MCV: 95.3 fL (ref 80.0–100.0)
Platelets: 215 10*3/uL (ref 150–400)
RBC: 3.39 MIL/uL — ABNORMAL LOW (ref 3.87–5.11)
RDW: 13.9 % (ref 11.5–15.5)
WBC: 5.2 10*3/uL (ref 4.0–10.5)
nRBC: 0 % (ref 0.0–0.2)

## 2022-01-10 LAB — APTT: aPTT: 34 seconds (ref 24–36)

## 2022-01-10 LAB — PROTIME-INR
INR: 1.2 (ref 0.8–1.2)
Prothrombin Time: 14.7 seconds (ref 11.4–15.2)

## 2022-01-11 LAB — SARS CORONAVIRUS 2 (TAT 6-24 HRS): SARS Coronavirus 2: NEGATIVE

## 2022-01-12 ENCOUNTER — Ambulatory Visit: Payer: Medicare HMO

## 2022-01-12 ENCOUNTER — Ambulatory Visit
Admission: RE | Admit: 2022-01-12 | Discharge: 2022-01-12 | Disposition: A | Discharge status: Home / Self Care | Payer: Medicare HMO | Attending: Pulmonary Disease | Admitting: Pulmonary Disease

## 2022-01-12 ENCOUNTER — Encounter
Admission: RE | Disposition: A | Discharge status: Home / Self Care | Payer: Self-pay | Source: Home / Self Care | Attending: Pulmonary Disease

## 2022-01-12 ENCOUNTER — Ambulatory Visit: Payer: Medicare HMO | Admitting: Certified Registered"

## 2022-01-12 ENCOUNTER — Other Ambulatory Visit: Payer: Self-pay

## 2022-01-12 DIAGNOSIS — I119 Hypertensive heart disease without heart failure: Secondary | ICD-10-CM | POA: Diagnosis not present

## 2022-01-12 DIAGNOSIS — I1310 Hypertensive heart and chronic kidney disease without heart failure, with stage 1 through stage 4 chronic kidney disease, or unspecified chronic kidney disease: Secondary | ICD-10-CM | POA: Diagnosis not present

## 2022-01-12 DIAGNOSIS — D381 Neoplasm of uncertain behavior of trachea, bronchus and lung: Secondary | ICD-10-CM | POA: Diagnosis not present

## 2022-01-12 DIAGNOSIS — R911 Solitary pulmonary nodule: Secondary | ICD-10-CM | POA: Diagnosis not present

## 2022-01-12 DIAGNOSIS — Z01812 Encounter for preprocedural laboratory examination: Secondary | ICD-10-CM

## 2022-01-12 DIAGNOSIS — R918 Other nonspecific abnormal finding of lung field: Secondary | ICD-10-CM | POA: Insufficient documentation

## 2022-01-12 DIAGNOSIS — I509 Heart failure, unspecified: Secondary | ICD-10-CM | POA: Diagnosis not present

## 2022-01-12 DIAGNOSIS — N189 Chronic kidney disease, unspecified: Secondary | ICD-10-CM | POA: Diagnosis not present

## 2022-01-12 DIAGNOSIS — Z20822 Contact with and (suspected) exposure to covid-19: Secondary | ICD-10-CM

## 2022-01-12 HISTORY — PX: VIDEO BRONCHOSCOPY WITH ENDOBRONCHIAL ULTRASOUND: SHX6177

## 2022-01-12 LAB — POCT I-STAT, CHEM 8
BUN: 21 mg/dL (ref 8–23)
Calcium, Ion: 1.01 mmol/L — ABNORMAL LOW (ref 1.15–1.40)
Chloride: 114 mmol/L — ABNORMAL HIGH (ref 98–111)
Creatinine, Ser: 1.4 mg/dL — ABNORMAL HIGH (ref 0.44–1.00)
Glucose, Bld: 79 mg/dL (ref 70–99)
HCT: 28 % — ABNORMAL LOW (ref 36.0–46.0)
Hemoglobin: 9.5 g/dL — ABNORMAL LOW (ref 12.0–15.0)
Potassium: 3.4 mmol/L — ABNORMAL LOW (ref 3.5–5.1)
Sodium: 146 mmol/L — ABNORMAL HIGH (ref 135–145)
TCO2: 22 mmol/L (ref 22–32)

## 2022-01-12 SURGERY — BRONCHOSCOPY, WITH EBUS
Anesthesia: General

## 2022-01-12 MED ORDER — LIDOCAINE HCL (CARDIAC) PF 100 MG/5ML IV SOSY
PREFILLED_SYRINGE | INTRAVENOUS | Status: DC | PRN
  Administered 2022-01-12: 100 mg via INTRAVENOUS

## 2022-01-12 MED ORDER — PROPOFOL 500 MG/50ML IV EMUL
INTRAVENOUS | Status: DC | PRN
  Administered 2022-01-12: 140 ug/kg/min via INTRAVENOUS

## 2022-01-12 MED ORDER — FENTANYL CITRATE (PF) 100 MCG/2ML IJ SOLN: 25.0000 ug | INTRAMUSCULAR | Status: DC | PRN

## 2022-01-12 MED ORDER — MIDAZOLAM HCL 2 MG/2ML IJ SOLN
INTRAMUSCULAR | Status: AC
  Filled 2022-01-12: qty 2

## 2022-01-12 MED ORDER — IPRATROPIUM-ALBUTEROL 0.5-2.5 (3) MG/3ML IN SOLN
RESPIRATORY_TRACT | Status: AC
  Filled 2022-01-12: qty 3

## 2022-01-12 MED ORDER — CHLORHEXIDINE GLUCONATE 0.12 % MT SOLN: 15.0000 mL | Freq: Once | OROMUCOSAL | Status: AC

## 2022-01-12 MED ORDER — BUTAMBEN-TETRACAINE-BENZOCAINE 2-2-14 % EX AERO
1.0000 | INHALATION_SPRAY | Freq: Once | CUTANEOUS | Status: DC
  Filled 2022-01-12: qty 20

## 2022-01-12 MED ORDER — OXYCODONE HCL 5 MG PO TABS: 5.0000 mg | ORAL_TABLET | Freq: Once | ORAL | Status: DC | PRN

## 2022-01-12 MED ORDER — MIDAZOLAM HCL 2 MG/2ML IJ SOLN
INTRAMUSCULAR | Status: DC | PRN
  Administered 2022-01-12: 1 mg via INTRAVENOUS

## 2022-01-12 MED ORDER — FENTANYL CITRATE (PF) 100 MCG/2ML IJ SOLN
INTRAMUSCULAR | Status: AC
  Filled 2022-01-12: qty 2

## 2022-01-12 MED ORDER — ORAL CARE MOUTH RINSE: 15.0000 mL | Freq: Once | OROMUCOSAL | Status: AC

## 2022-01-12 MED ORDER — LACTATED RINGERS IV SOLN: INTRAVENOUS | Status: DC | PRN

## 2022-01-12 MED ORDER — FAMOTIDINE 20 MG PO TABS: 20.0000 mg | ORAL_TABLET | Freq: Once | ORAL | Status: AC

## 2022-01-12 MED ORDER — SUGAMMADEX SODIUM 200 MG/2ML IV SOLN
INTRAVENOUS | Status: DC | PRN
  Administered 2022-01-12: 300 mg via INTRAVENOUS

## 2022-01-12 MED ORDER — OXYCODONE HCL 5 MG/5ML PO SOLN: 5.0000 mg | Freq: Once | ORAL | Status: DC | PRN

## 2022-01-12 MED ORDER — PROPOFOL 10 MG/ML IV BOLUS
INTRAVENOUS | Status: DC | PRN
  Administered 2022-01-12: 150 mg via INTRAVENOUS

## 2022-01-12 MED ORDER — DEXAMETHASONE SODIUM PHOSPHATE 10 MG/ML IJ SOLN
INTRAMUSCULAR | Status: AC
  Filled 2022-01-12: qty 1

## 2022-01-12 MED ORDER — ROCURONIUM BROMIDE 100 MG/10ML IV SOLN
INTRAVENOUS | Status: DC | PRN
  Administered 2022-01-12: 80 mg via INTRAVENOUS
  Administered 2022-01-12: 20 mg via INTRAVENOUS

## 2022-01-12 MED ORDER — LIDOCAINE HCL URETHRAL/MUCOSAL 2 % EX GEL
1.0000 | Freq: Once | CUTANEOUS | Status: DC
  Filled 2022-01-12: qty 5

## 2022-01-12 MED ORDER — FAMOTIDINE 20 MG PO TABS
ORAL_TABLET | ORAL | Status: AC
  Administered 2022-01-12: 20 mg via ORAL
  Filled 2022-01-12: qty 1

## 2022-01-12 MED ORDER — IPRATROPIUM-ALBUTEROL 0.5-2.5 (3) MG/3ML IN SOLN
3.0000 mL | Freq: Once | RESPIRATORY_TRACT | Status: AC
  Administered 2022-01-12: 3 mL via RESPIRATORY_TRACT

## 2022-01-12 MED ORDER — CHLORHEXIDINE GLUCONATE 0.12 % MT SOLN
OROMUCOSAL | Status: AC
  Administered 2022-01-12: 15 mL via OROMUCOSAL
  Filled 2022-01-12: qty 15

## 2022-01-12 MED ORDER — KETAMINE HCL 50 MG/ML IJ SOLN
INTRAMUSCULAR | Status: AC
  Filled 2022-01-12: qty 1

## 2022-01-12 MED ORDER — ONDANSETRON HCL 4 MG/2ML IJ SOLN
INTRAMUSCULAR | Status: AC
  Filled 2022-01-12: qty 2

## 2022-01-12 MED ORDER — ROCURONIUM BROMIDE 10 MG/ML (PF) SYRINGE
PREFILLED_SYRINGE | INTRAVENOUS | Status: AC
  Filled 2022-01-12: qty 10

## 2022-01-12 MED ORDER — DEXAMETHASONE SODIUM PHOSPHATE 10 MG/ML IJ SOLN
INTRAMUSCULAR | Status: DC | PRN
  Administered 2022-01-12: 10 mg via INTRAVENOUS

## 2022-01-12 MED ORDER — ONDANSETRON HCL 4 MG/2ML IJ SOLN
INTRAMUSCULAR | Status: DC | PRN
  Administered 2022-01-12: 4 mg via INTRAVENOUS

## 2022-01-12 MED ORDER — KETAMINE HCL 10 MG/ML IJ SOLN
INTRAMUSCULAR | Status: DC | PRN
  Administered 2022-01-12 (×2): 20 mg via INTRAVENOUS

## 2022-01-12 MED ORDER — LIDOCAINE HCL (PF) 2 % IJ SOLN
INTRAMUSCULAR | Status: AC
Start: 2022-01-12 — End: ?
  Filled 2022-01-12: qty 5

## 2022-01-12 MED ORDER — PROPOFOL 1000 MG/100ML IV EMUL
INTRAVENOUS | Status: AC
  Filled 2022-01-12: qty 100

## 2022-01-12 MED ORDER — PHENYLEPHRINE HCL 0.25 % NA SOLN
1.0000 | Freq: Four times a day (QID) | NASAL | Status: DC | PRN
  Filled 2022-01-12: qty 15

## 2022-01-12 MED ORDER — LIDOCAINE HCL (PF) 1 % IJ SOLN
30.0000 mL | Freq: Once | INTRAMUSCULAR | Status: DC
  Filled 2022-01-12: qty 30

## 2022-01-12 MED ORDER — GLYCOPYRROLATE 0.2 MG/ML IJ SOLN
INTRAMUSCULAR | Status: AC
  Filled 2022-01-12: qty 1

## 2022-01-12 MED ORDER — GLYCOPYRROLATE 0.2 MG/ML IJ SOLN
INTRAMUSCULAR | Status: DC | PRN
  Administered 2022-01-12: .2 mg via INTRAVENOUS

## 2022-01-12 MED ORDER — FENTANYL CITRATE (PF) 100 MCG/2ML IJ SOLN
INTRAMUSCULAR | Status: DC | PRN
  Administered 2022-01-12 (×2): 50 ug via INTRAVENOUS

## 2022-01-12 MED ORDER — SODIUM CHLORIDE 0.9 % IV SOLN: INTRAVENOUS | Status: DC

## 2022-01-12 NOTE — Transfer of Care (Signed)
Immediate Anesthesia Transfer of Care Note  Patient: [place]  Procedure(s) Performed: VIDEO BRONCHOSCOPY WITH ENDOBRONCHIAL ULTRASOUND ROBOTIC ASSISTED NAVIGATIONAL BRONCHOSCOPY  Patient Location: PACU  Anesthesia Type:General  Level of Consciousness: drowsy  Airway & Oxygen Therapy: Patient Spontanous Breathing and Patient connected to face mask oxygen  Post-op Assessment: Report given to RN, Post -op Vital signs reviewed and stable and Patient moving all extremities  Post vital signs: Reviewed and stable  Last Vitals:  Vitals Value Taken Time  BP    Temp    Pulse 66 01/12/22 1552  Resp 22 01/12/22 1552  SpO2 100 % 01/12/22 1552  Vitals shown include unvalidated device data.  Last Pain:  Vitals:   01/12/22 1124  TempSrc: Temporal  PainSc: 0-No pain      Patients Stated Pain Goal: 0 (09/31/12 1624)  Complications: No notable events documented.

## 2022-01-12 NOTE — Anesthesia Preprocedure Evaluation (Signed)
Anesthesia Evaluation  Patient identified by MRN, date of birth, ID band Patient awake    Reviewed: Allergy & Precautions, NPO status , Patient's Chart, lab work & pertinent test results  History of Anesthesia Complications (+) PONV and history of anesthetic complications  Airway Mallampati: III  TM Distance: >3 FB Neck ROM: full    Dental  (+) Poor Dentition, Dental Advidsory Given   Pulmonary shortness of breath and with exertion, asthma ,    Pulmonary exam normal        Cardiovascular hypertension, (-) angina+ CAD and +CHF  + dysrhythmias Atrial Fibrillation      Neuro/Psych PSYCHIATRIC DISORDERS CVA, No Residual Symptoms    GI/Hepatic negative GI ROS, Neg liver ROS,   Endo/Other  Hypothyroidism   Renal/GU Renal disease     Musculoskeletal   Abdominal   Peds  Hematology negative hematology ROS (+)   Anesthesia Other Findings Past Medical History: No date: Allergy No date: Anemia No date: Asthma No date: Atrial fibrillation [facility]) 2001: Cerebrovascular accident [facility])     Comment:  NO RESIDUAL EFFECTS No date: Chiari I malformation ([place]) No date: COPD (chronic obstructive pulmonary disease) (HCC) No date: Coronary artery disease No date: Depression No date: Dyspnea No date: Dysrhythmia     Comment:  atrial fib No date: GERD (gastroesophageal reflux disease) No date: Heart murmur No date: Hyperlipidemia No date: Hypertension No date: Hypoparathyroidism (HCC) No date: Hypothyroidism No date: Personality disorder, depressive No date: Pneumonia No date: PONV (postoperative nausea and vomiting)     Comment:  difficulty breathing during endoscopy, colonoscopy               performed prior w/out problems-NAUSEA ONLY No date: Pre-diabetes No date: Renal insufficiency No date: Sleep apnea     Comment:  CPAP 1999: Thyroid cancer ([place])     Comment:  Papillary No date: Thyroid disease     Comment:   hypothyroid   Past Surgical History: No date: CANNOT TOLERATE PAP / Virginal No date: CATARACT EXTRACTION, BILATERAL 10/24/2018: ELECTROMAGNETIC NAVIGATION BROCHOSCOPY; Left     Comment:  Procedure: ELECTROMAGNETIC NAVIGATION BRONCHOSCOPY LEFT;              Surgeon: Tyler Pita, MD;  Location: [facility] ORS;                Service: Cardiopulmonary;  Laterality: Left; 11/18/2017: LEFT HEART CATH AND CORONARY ANGIOGRAPHY; Right     Comment:  Procedure: Left Heart Cath with possible coronary               intervention;  Surgeon: Dionisio David, MD;  Location:               [place] [facility];  Service: Cardiovascular;                Laterality: Right; 12/1999: MRI brain No date: THYROIDECTOMY No date: TONSILLECTOMY 06/26/2016: TOTAL HIP ARTHROPLASTY; Right     Comment:  Procedure: TOTAL HIP ARTHROPLASTY ANTERIOR APPROACH;                Surgeon: Hessie Knows, MD;  Location: [facility] ORS;  Service:              Orthopedics;  Laterality: Right; 04/01/2020: VIDEO BRONCHOSCOPY WITH ENDOBRONCHIAL NAVIGATION; Left     Comment:  Procedure: VIDEO BRONCHOSCOPY WITH ENDOBRONCHIAL               NAVIGATION;  Surgeon: Tyler Pita, MD;  Location:  [facility] ORS;  Service: Pulmonary;  Laterality: Left;  BMI    Body Mass Index: 23.21 kg/m      Reproductive/Obstetrics negative OB ROS                             Anesthesia Physical Anesthesia Plan  ASA: 3  Anesthesia Plan: General ETT   Post-op Pain Management:    Induction: Intravenous  PONV Risk Score and Plan: 4 or greater and Ondansetron, Dexamethasone, Midazolam and Treatment may vary due to age or medical condition  Airway Management Planned: Oral ETT  Additional Equipment:   Intra-op Plan:   Post-operative Plan: Extubation in OR  Informed Consent: I have reviewed the patients History and Physical, chart, labs and discussed the procedure including the risks, benefits and alternatives for the  proposed anesthesia with the patient or authorized representative who has indicated his/her understanding and acceptance.     Dental Advisory Given  Plan Discussed with: Anesthesiologist, CRNA and Surgeon  Anesthesia Plan Comments: (Patient consented for risks of anesthesia including but not limited to:  - adverse reactions to medications - damage to eyes, teeth, lips or other oral mucosa - nerve damage due to positioning  - sore throat or hoarseness - Damage to heart, brain, nerves, lungs, other parts of body or loss of life  Patient voiced understanding.)        Anesthesia Quick Evaluation

## 2022-01-12 NOTE — Procedures (Signed)
ELECTROMAGNETIC NAVIGATIONAL BRONCHOSCOPY PROCEDURE NOTE  FIBEROPTIC BRONCHOSCOPY WITH THERAPEUTIC ASPIRATION OF TRACHEOBRONCHIAL TREE AND BAL PROCEDURE NOTE   Flexible bronchoscopy was performed  by : Lanney Gins MD  assistance by : 1)Repiratory therapist  and 2)[employer] staff and 3) Anesthesia team and 4) Flouroscopy team and 5) [employer] supporting staff   Indication for the procedure was :  Pre-procedural H&P. The following assessment was performed on the day of the procedure prior to initiating sedation History:  Chest pain n Dyspnea y Hemoptysis n Cough y Fever n Other pertinent items n  Examination Vital signs -reviewed as per nursing documentation today Cardiac    Murmurs: n  Rubs : n  Gallop: n Lungs Wheezing: n Rales : n Rhonchi :y  Other pertinent findings: SOB/hypoxemia due to chronic lung disease   Pre-procedural assessment for Procedural Sedation included: Depth of sedation: As per anesthesia team  ASA Classification:  2 Mallampati airway assessment: 3    Medication list reviewed: y  The patient's interval history was taken and revealed: no new complaints The pre- procedure physical examination revealed: No new findings Refer to prior clinic note for details.  Informed Consent: Informed consent was obtained from:  patient after explanation of procedure and risks, benefits, as well as alternative procedures available.  Explanation of level of sedation and possible transfusion was also provided.    Procedural Preparation: Time out was performed and patient was identified by name and birthdate and procedure to be performed and side for sampling, if any, was specified. Pt was intubated by anesthesia.  The patient was appropriately draped.   Fiberoptic bronchoscopy with airway inspection and BAL Procedure findings:  Bronchoscope was inserted via ETT  without difficulty.  Posterior oropharynx, epiglottis, arytenoids, false cords and vocal cords  were not visualized as these were bypassed by endotracheal tube. The distal trachea was normal in circumference and appearance without mucosal, cartilaginous or branching abnormalities.  The main carina was mildly splayed . All right and left lobar airways were visualized to the Subsegmental level.  Sub- sub segmental carinae were identified in all the distal airways.   Secretions were visible in the following airways and appeared to be clear.  The mucosa was : NORMAL  Airways were notable for:        exophytic lesions :n       extrinsic compression in the following distributions: n.       Friable mucosa: NO       Anthrocotic material /pigmentation: n     Post procedure Diagnosis:     MILD MUCUS PLUGGING WORSE AT LEFT UPPER LOBE ASPIRATED.    BAL AT LEFT UPPER LOBE X 1 WITH SEROSANG CELLULAR RETURN     MONARCH ROBOTIC Navigational Bronchoscopy Procedure Findings:    Post appropriate planning and registration peripheral navigation was used to visualize target lesion.    --LESION #1 LLL - CYTOBRUSH >>>ATYPICAL CELLS PRESENT , SURGICAL PATHOLOGY X7,  FNA X2    --LESION #2 LUL FURTHER FROM PLEURA - FORCEPS SURGICAL BIOPSY X 4   --LESION #3 LUL CLOSER TO PLEURA - FORCEPS SUGICAL BIOPSY X 5   Specimens obtained included:  Bronchial washings site: LUL sent for: CYTOLOGY                                                     Cytology  brushes : LLL X2  Broncho-alveolar lavage site:LUL  sent for CYTOLOGY                              28m volume infused 269mvolume returned with [facility] appearance   Immediate sampling complications included:NONE  Epinephrine NONE ml was used topically  The bronchoscopy was terminated due to completion of the planned procedure and the bronchoscope was removed.   Total dosage of Lidocaine was NONE mg Total fluoroscopy time was AS PER RADS minutes   Estimated Blood loss: EXPECTED < 5cc.  Complications included:  NONE      Disposition: HOME WITH FAMILY   Follow up with Dr. AlLanney Ginsn 5 days for result discussion.     FuOttie GlazierD  [facility] Pulmonary & Critical Care Medicine

## 2022-01-12 NOTE — Discharge Instructions (Addendum)

## 2022-01-12 NOTE — Anesthesia Postprocedure Evaluation (Signed)
Anesthesia Post Note  Patient: Brenda Barber  Procedure(s) Performed: VIDEO BRONCHOSCOPY WITH ENDOBRONCHIAL ULTRASOUND ROBOTIC ASSISTED NAVIGATIONAL BRONCHOSCOPY  Patient location during evaluation: PACU Anesthesia Type: General Level of consciousness: awake and alert, oriented and patient cooperative Pain management: pain level controlled Vital Signs Assessment: post-procedure vital signs reviewed and stable Respiratory status: spontaneous breathing, nonlabored ventilation and respiratory function stable Cardiovascular status: blood pressure returned to baseline and stable Postop Assessment: adequate PO intake Anesthetic complications: no   No notable events documented.   Last Vitals:  Vitals:   01/12/22 1549 01/12/22 1600  BP: (!) 126/93 (!) 141/81  Pulse: 66 64  Resp: 16 18  Temp: (!) 35.8 C   SpO2: 100% 100%    Last Pain:  Vitals:   01/12/22 1600  TempSrc:   PainSc: Asleep                 Darrin Nipper

## 2022-01-12 NOTE — Op Note (Signed)
Robotic assisted bronchoscopy Bodyvision  Augmented fluoroscopy   Indication: bilateral lung nodules suspicious for lung cancer  Preoperative Diagnosis: lung nodules   Consent: Verbal/Written: obtained  Benefits, limitations and potential complications of the procedure were discussed with the patient/family.  Complications from bronchoscopy are rare and most often minor, but if they occur they may include breathing difficulty, vocal cord spasm, hoarseness, slight fever, vomiting, dizziness, bronchospasm, infection, low blood oxygen, bleeding from biopsy site, or an allergic reaction to medications.  It is uncommon for patients to experience other more serious complications for example: Collapsed lung requiring chest tube placement, respiratory failure, heart attack and/or cardiac arrhythmia.  Patient understood the potential complications and agreed to proceed.  Surgeon: Donalee Citrin Anesthesiologist/CRNA: RUstin  Type of Anesthesia: General endotracheal  Procedures Performed:   Robotic bronchoscopy: Procedure consists of robotic navigation comprised of electromagnetics, optical pattern recognition and robotic kinematic data - to triangulate bronchoscope location during the procedure and provide accurate positional data to biopsy a lesion. Cellvizio probe based confocal laser endomicroscopy (pCLE) utilizing blue laser endomicroscopy. Augmented fluoroscopy with Body Vision.  Description of Procedure:  Robotic bronchoscopy: The patient was brought to Procedure Room 2 (Bronchoscopy Suite) in the OR area where appropriate timeout was taken with the staff after the patient was inducted under general anesthesia.  The patient was inducted under general anesthesia and intubated by the anesthesia team.  Patient was intubated with a 8.5 ET tube without difficulty.  Tube was secured at 4 cm above the carina.  A Portex adapter was placed on the ET tube flange.  Once the patient was under adequate general  anesthesia the Olympus therapeutic video bronchoscope was advanced and an anatomic airway tour and surveillance bronchoscopy was performed.The distal trachea appeared unremarkable. The main carina was sharp.  No secretions were seen in either right or left mainstem bronchi. The RUL, RLL, RML appeared to be free of endobronchial masses, lesions, or purulent secretions. LUL had mucus which was aspirated  There were some scant benign appearing secretions on the LLL that were suctioned until cleared.  Once the survey bronchoscopy was completed, registration for the augmented fluoroscopy (Body Vision) was then performed with the fluoroscopic C arm.  Once this was completed, the robotic bronchoscope ET tube adapter was placed and ETT was cut to proper length and secured on the mid plane.  The [facility] robotic scope was then advanced through the ETT and registration was performed successfully.  There was good correlation between the robotic mapping and bronchoscopic mapping. With the assistance of fused navigation, the bronchoscope was advanced to the LLL, then LUL lesion 1 then LUL lesion 2 nodule/mass. The tip of the working channel sat within 20 mm of eachnodule . Positioning was confirmed with augmented fluoroscopy.  Augmented fluoroscopy via Body Vision was utilized to optimize the position most favorable for biopsies, then the robotic bronchoscope was anchored to maintain position. The robotic bronchoscope was then retracted all the way out after confirmation of excellent hemostasis.  The patient received bronchial lavage with zero mL of 1% lidocaine via the ET tube. The patient tolerated the procedure well. No significant bleeding was observed at the conclusion of the procedure.  At this point, the patient was allowed to emerge from general anesthesia, and was extubated in the procedure room without incident.  The patient  was taken to the PACU in satisfactory condition.  Auscultation of the lungs showed no change  from pre bronchoscopy examination.  Patient tolerated the procedure well with no untoward  effects of anesthesia noted.   Specimens Obtained:  Transbronchial Forceps Biopsy: LLL, LUL lesion 1 and LUL lesion 2 as per procedure note  Transbronchial Brush: LLL x2  Targeted BAL: LUL  Fluoroscopy: Augmented fluoroscopy (Body Vision) was utilized during the course of this procedure to assure that biopsies were taken in a safe manner under fluoroscopic guidance with spot films required.  Total fluoroscopy time:  as per radiology     *This note was dictated using voice recognition software/Dragon.  Despite best efforts to proofread, errors can occur which can change the meaning.  Any change was purely unintentional.   Ottie Glazier, M.D.  Pulmonary & Alligator

## 2022-01-12 NOTE — H&P (Signed)
PULMONOLOGY         Date: 01/12/2022,   MRN# [id number] Brenda Barber [birth year]     AdmissionWeight: 59.4 kg                 CurrentWeight: 59.4 kg  Referring provider: Dr Raul Del   CHIEF COMPLAINT:   Multiple bilateral lung nodules   HISTORY OF PRESENT ILLNESS   Brenda Barber is a 66 y.o. female presents to clinic for follow up no new respiratory sxs. Cough after eating. No chest pain or ectopy, no bleeding. No fever or chills.  IMPRESSION:  1. 14 mm solid left lower lobe pulmonary nodule.  2. Additional smaller solid pulmonary nodules and ground-glass  nodules appear unchanged from prior.  3. No new pulmonary nodules. No focal lung infiltrate. No  adenopathy.    Patient here for tissue biopsy via Monarch bronchoscopy for tissue diagnosis.   Reviewed risks/complications and benefits with patient, risks include infection, pneumothorax/pneumomediastinum which may require chest tube placement as well as overnight/prolonged hospitalization and possible mechanical ventilation. Other risks include bleeding and very rarely death.  Patient understands risks and wishes to proceed.  Additional questions were answered, and patient is aware that post procedure patient will be going home with family and may experience cough with possible clots on expectoration as well as phlegm which may last few days as well as hoarseness of voice post intubation and mechanical ventilation.    PAST MEDICAL HISTORY   Past Medical History:  Diagnosis Date   Allergy    Anemia    Asthma    Atrial fibrillation ([place])    Cerebrovascular accident ([place]) 2001   NO RESIDUAL EFFECTS   Chiari I malformation ([place])    COPD (chronic obstructive pulmonary disease) ([place])    Coronary artery disease    Depression    Dyspnea    Dysrhythmia    atrial fib   GERD (gastroesophageal reflux disease)    Heart murmur    Hyperlipidemia    Hypertension    Hypoparathyroidism (HCC)    Hypothyroidism     Personality disorder, depressive    Pneumonia    PONV (postoperative nausea and vomiting)    difficulty breathing during endoscopy, colonoscopy performed prior w/out problems-NAUSEA ONLY   Pre-diabetes    Renal insufficiency    Sleep apnea    CPAP   Thyroid cancer ([place]) 1999   Papillary   Thyroid disease    hypothyroid      SURGICAL HISTORY   Past Surgical History:  Procedure Laterality Date   CANNOT TOLERATE PAP / Virginal     CATARACT EXTRACTION, BILATERAL     ELECTROMAGNETIC NAVIGATION BROCHOSCOPY Left 10/24/2018   Procedure: ELECTROMAGNETIC NAVIGATION BRONCHOSCOPY LEFT;  Surgeon: Tyler Pita, MD;  Location: [facility] ORS;  Service: Cardiopulmonary;  Laterality: Left;   LEFT HEART CATH AND CORONARY ANGIOGRAPHY Right 11/18/2017   Procedure: Left Heart Cath with possible coronary intervention;  Surgeon: Dionisio David, MD;  Location: [place] [facility];  Service: Cardiovascular;  Laterality: Right;   MRI brain  12/1999   THYROIDECTOMY     TONSILLECTOMY     TOTAL HIP ARTHROPLASTY Right 06/26/2016   Procedure: TOTAL HIP ARTHROPLASTY ANTERIOR APPROACH;  Surgeon: Hessie Knows, MD;  Location: [facility] ORS;  Service: Orthopedics;  Laterality: Right;   VIDEO BRONCHOSCOPY WITH ENDOBRONCHIAL NAVIGATION Left 04/01/2020   Procedure: VIDEO BRONCHOSCOPY WITH ENDOBRONCHIAL NAVIGATION;  Surgeon: Tyler Pita, MD;  Location: [facility] ORS;  Service: Pulmonary;  Laterality:  Left;     FAMILY HISTORY   Family History  Problem Relation Age of Onset   Diabetes Father    Diabetes Paternal Aunt    Diabetes Paternal Uncle    Breast cancer Sister 92     SOCIAL HISTORY   Social History   Tobacco Use   Smoking status: Never   Smokeless tobacco: Never  Vaping Use   Vaping Use: Never used  Substance Use Topics   Alcohol use: No   Drug use: No     MEDICATIONS    Home Medication:    Current Medication:  Current Facility-Administered Medications:    0.9 %  sodium chloride infusion,  , Intravenous, Continuous, Arita Miss, MD   butamben-tetracaine-benzocaine (CETACAINE) spray 1 spray, 1 spray, Topical, Once, Ottie Glazier, MD   fentaNYL (SUBLIMAZE) injection 25-50 mcg, 25-50 mcg, Intravenous, Q5 min PRN, Dimas Millin, MD   lidocaine (PF) (XYLOCAINE) 1 % injection 30 mL, 30 mL, Infiltration, Once, Copper Basnett, MD   lidocaine (XYLOCAINE) 2 % jelly 1 Application, 1 Application, Topical, Once, Chastin Riesgo, MD   oxyCODONE (Oxy IR/ROXICODONE) immediate release tablet 5 mg, 5 mg, Oral, Once PRN **OR** oxyCODONE (ROXICODONE) 5 MG/5ML solution 5 mg, 5 mg, Oral, Once PRN, Dimas Millin, MD   phenylephrine (NEO-SYNEPHRINE) 0.25 % nasal spray 1 spray, 1 spray, Each Nare, Q6H PRN, Ottie Glazier, MD    ALLERGIES   Aspirin-dipyridamole er and Rosuvastatin     REVIEW OF SYSTEMS    Review of Systems:  Gen:  Denies  fever, sweats, chills weigh loss  HEENT: Denies blurred vision, double vision, ear pain, eye pain, hearing loss, nose bleeds, sore throat Cardiac:  No dizziness, chest pain or heaviness, chest tightness,edema Resp:   reports dyspnea chronically  Gi: Denies swallowing difficulty, stomach pain, nausea or vomiting, diarrhea, constipation, bowel incontinence Gu:  Denies bladder incontinence, burning urine Ext:   Denies Joint pain, stiffness or swelling Skin: Denies  skin rash, easy bruising or bleeding or hives Endoc:  Denies polyuria, polydipsia , polyphagia or weight change Psych:   Denies depression, insomnia or hallucinations   Other:  All other systems negative   VS: BP (!) 126/93 (BP Location: Right Arm)   Pulse 66   Temp (!) 96.5 F (35.8 C)   Resp 16   Ht _0  (1.6 m)   Wt 59.4 kg   SpO2 100%   BMI 23.21 kg/m      PHYSICAL EXAM    GENERAL:NAD, no fevers, chills, no weakness no fatigue HEAD: Normocephalic, atraumatic.  EYES: Pupils equal, round, reactive to light. Extraocular muscles intact. No scleral icterus.  MOUTH: Moist  mucosal membrane. Dentition intact. No abscess noted.  EAR, NOSE, THROAT: Clear without exudates. No external lesions.  NECK: Supple. No thyromegaly. No nodules. No JVD.  PULMONARY: decreased breath sounds with mild rhonchi worse at bases bilaterally.  CARDIOVASCULAR: S1 and S2. Regular rate and rhythm. No murmurs, rubs, or gallops. No edema. Pedal pulses 2+ bilaterally.  GASTROINTESTINAL: Soft, nontender, nondistended. No masses. Positive bowel sounds. No hepatosplenomegaly.  MUSCULOSKELETAL: No swelling, clubbing, or edema. Range of motion full in all extremities.  NEUROLOGIC: Cranial nerves II through XII are intact. No gross focal neurological deficits. Sensation intact. Reflexes intact.  SKIN: No ulceration, lesions, rashes, or cyanosis. Skin warm and dry. Turgor intact.  PSYCHIATRIC: Mood, affect within normal limits. The patient is awake, alert and oriented x 3. Insight, judgment intact.       IMAGING   Reviewed serial  CT chest most recent 01/01/22  ASSESSMENT/PLAN    Bilateral ground glass nodules and solitary LLL nodule    - patient here today for bronchoscopy with BAL and airway inspection with tissue biopsy utilizing monarch with bodyvision  - no new complaints wishes to proceed   -Reviewed risks/complications and benefits with patient, risks include infection, pneumothorax/pneumomediastinum which may require chest tube placement as well as overnight/prolonged hospitalization and possible mechanical ventilation. Other risks include bleeding and very rarely death.  Patient understands risks and wishes to proceed.  Additional questions were answered, and patient is aware that post procedure patient will be going home with family and may experience cough with possible clots on expectoration as well as phlegm which may last few days as well as hoarseness of voice post intubation and mechanical ventilation.       Thank you for allowing me to participate in the care of this patient.    Patient/Family are satisfied with care plan and all questions have been answered.    Provider disclosure: Patient with at least one acute or chronic illness or injury that poses a threat to life or bodily function and is being managed actively during this encounter.  All of the below services have been performed independently by signing provider:  review of prior documentation from internal and or external health records.  Review of previous and current lab results.  Interview and comprehensive assessment during patient visit today. Review of current and previous chest radiographs/CT scans. Discussion of management and test interpretation with health care team and patient/family.   This document was prepared using Dragon voice recognition software and may include unintentional dictation errors.     Ottie Glazier, M.D.  Division of Pulmonary & Critical Care Medicine

## 2022-01-12 NOTE — Anesthesia Procedure Notes (Signed)
Procedure Name: Intubation Date/Time: 01/12/2022 12:51 PM  Performed by: Esaw Grandchild, CRNAPre-anesthesia Checklist: Patient identified, Emergency Drugs available, Suction available and Patient being monitored Patient Re-evaluated:Patient Re-evaluated prior to induction Oxygen Delivery Method: Circle system utilized Preoxygenation: Pre-oxygenation with 100% oxygen Induction Type: IV induction Ventilation: Mask ventilation without difficulty Laryngoscope Size: Miller and 2 Grade View: Grade I Tube type: Oral Tube size: 8.5 mm Number of attempts: 1 Airway Equipment and Method: Stylet, Oral airway and Bite block Placement Confirmation: ETT inserted through vocal cords under direct vision, positive ETCO2 and breath sounds checked- equal and bilateral Secured at: 21 cm Tube secured with: Tape Dental Injury: Teeth and Oropharynx as per pre-operative assessment

## 2022-01-13 ENCOUNTER — Encounter: Payer: Self-pay | Admitting: Pulmonary Disease

## 2022-01-15 ENCOUNTER — Encounter: Payer: Self-pay | Admitting: Pulmonary Disease

## 2022-01-15 LAB — SURGICAL PATHOLOGY

## 2022-01-15 LAB — CYTOLOGY - NON PAP

## 2022-01-29 DIAGNOSIS — E892 Postprocedural hypoparathyroidism: Secondary | ICD-10-CM | POA: Diagnosis not present

## 2022-01-29 DIAGNOSIS — N1832 Chronic kidney disease, stage 3b: Secondary | ICD-10-CM | POA: Diagnosis not present

## 2022-01-29 DIAGNOSIS — E890 Postprocedural hypothyroidism: Secondary | ICD-10-CM | POA: Diagnosis not present

## 2022-02-05 DIAGNOSIS — N1832 Chronic kidney disease, stage 3b: Secondary | ICD-10-CM | POA: Diagnosis not present

## 2022-02-05 DIAGNOSIS — E892 Postprocedural hypoparathyroidism: Secondary | ICD-10-CM | POA: Diagnosis not present

## 2022-02-05 DIAGNOSIS — R7303 Prediabetes: Secondary | ICD-10-CM | POA: Diagnosis not present

## 2022-02-05 DIAGNOSIS — Z85850 Personal history of malignant neoplasm of thyroid: Secondary | ICD-10-CM | POA: Diagnosis not present

## 2022-02-05 DIAGNOSIS — E890 Postprocedural hypothyroidism: Secondary | ICD-10-CM | POA: Diagnosis not present

## 2022-02-12 DIAGNOSIS — I10 Essential (primary) hypertension: Secondary | ICD-10-CM | POA: Diagnosis not present

## 2022-02-12 DIAGNOSIS — I2510 Atherosclerotic heart disease of native coronary artery without angina pectoris: Secondary | ICD-10-CM | POA: Diagnosis not present

## 2022-02-12 DIAGNOSIS — I340 Nonrheumatic mitral (valve) insufficiency: Secondary | ICD-10-CM | POA: Diagnosis not present

## 2022-02-12 DIAGNOSIS — I4891 Unspecified atrial fibrillation: Secondary | ICD-10-CM | POA: Diagnosis not present

## 2022-02-12 DIAGNOSIS — J45998 Other asthma: Secondary | ICD-10-CM | POA: Diagnosis not present

## 2022-02-12 DIAGNOSIS — E785 Hyperlipidemia, unspecified: Secondary | ICD-10-CM | POA: Diagnosis not present

## 2022-02-16 DIAGNOSIS — R2689 Other abnormalities of gait and mobility: Secondary | ICD-10-CM | POA: Diagnosis not present

## 2022-02-16 DIAGNOSIS — R413 Other amnesia: Secondary | ICD-10-CM | POA: Diagnosis not present

## 2022-02-16 DIAGNOSIS — G479 Sleep disorder, unspecified: Secondary | ICD-10-CM | POA: Diagnosis not present

## 2022-02-26 ENCOUNTER — Other Ambulatory Visit: Payer: Self-pay | Admitting: *Deleted

## 2022-02-26 NOTE — Progress Notes (Signed)
Per Dr. Rogue Bussing, pt needs to be added for discussion at tumor board this week. Orders placed and schedule message sent to schedule pt for further follow up after discussion.

## 2022-03-01 ENCOUNTER — Other Ambulatory Visit: Payer: Medicare HMO

## 2022-03-01 NOTE — Progress Notes (Addendum)
Tumor Board Documentation  Tareka Jhaveri Mccuistion was presented by Dr Cindra Eves at our Tumor Board on 03/01/2022, which included representatives from medical oncology, radiology, pathology, research, navigation, internal medicine, radiation oncology, pharmacy, genetics, pulmonology, surgicaly.  Brenda Barber currently presents as a current patient, for [place], for new positive pathology with history of the following treatments: active survellience.  Additionally, we reviewed previous medical and familial history, history of present illness, and recent lab results along with all available histopathologic and imaging studies. The tumor board considered available treatment options and made the following recommendations: Radiation therapy (primary modality)    The following procedures/referrals were also placed: No orders of the defined types were placed in this encounter.   Clinical Trial Status: not discussed   Staging used: Pathologic Stage, To be determined AJCC Staging:       Group: [facility] Adenocarcinoma of LUL Lung   National site-specific guidelines   were discussed with respect to the case.  Tumor board is a meeting of clinicians from various specialty areas who evaluate and discuss patients for whom a multidisciplinary approach is being considered. Final determinations in the plan of care are those of the provider(s). The responsibility for follow up of recommendations given during tumor board is that of the provider.   Today's extended care, comprehensive team conference, [place] was not present for the discussion and was not examined.   Multidisciplinary Tumor Board is a multidisciplinary case peer review process.  Decisions discussed in the Multidisciplinary Tumor Board reflect the opinions of the specialists present at the conference without having examined the patient.  Ultimately, treatment and diagnostic decisions rest with the primary provider(s) and the patient.

## 2022-03-05 ENCOUNTER — Encounter: Payer: Self-pay | Admitting: Internal Medicine

## 2022-03-05 ENCOUNTER — Inpatient Hospital Stay: Payer: Medicare HMO | Attending: Internal Medicine | Admitting: Internal Medicine

## 2022-03-05 DIAGNOSIS — N183 Chronic kidney disease, stage 3 unspecified: Secondary | ICD-10-CM | POA: Insufficient documentation

## 2022-03-05 DIAGNOSIS — R918 Other nonspecific abnormal finding of lung field: Secondary | ICD-10-CM | POA: Diagnosis not present

## 2022-03-05 DIAGNOSIS — C3432 Malignant neoplasm of lower lobe, left bronchus or lung: Secondary | ICD-10-CM | POA: Diagnosis not present

## 2022-03-05 DIAGNOSIS — Z803 Family history of malignant neoplasm of breast: Secondary | ICD-10-CM | POA: Diagnosis not present

## 2022-03-05 DIAGNOSIS — I129 Hypertensive chronic kidney disease with stage 1 through stage 4 chronic kidney disease, or unspecified chronic kidney disease: Secondary | ICD-10-CM | POA: Insufficient documentation

## 2022-03-05 DIAGNOSIS — Z85850 Personal history of malignant neoplasm of thyroid: Secondary | ICD-10-CM | POA: Insufficient documentation

## 2022-03-05 NOTE — Progress Notes (Signed)
[place] OFFICE PROGRESS NOTE  Patient Care Team: [name], MD as PCP - General (Internal Medicine) Telford Nab, RN as Registered Nurse Erby Pian, MD as Consulting Physician (Pulmonary Disease)   Cancer Staging  No matching staging information was found for the patient.   Oncology History Overview Note  # bil Lung nodules/GOO Multiple [id number]; Dr.Oaks].  # LLL nodule 8-47mm/ and bil GGO  [May 219- Off amiodarone; Dr.Khan;cards]. June 2020-biopsy [Dr.Gonzalez]-nondiagnostic.   --------------------------------------------------------------------------------------------------  # Thyroid cancer [2000; [facility]] s/p RAI; post-surgical hypothyroidism/ post-surgical hypoparathyroidism.s/p  total thyroidectomy and was found to have a 6 cm follicular thyroid cancer without any local invasion. This was a [id number] follicular thyroid cancer, stage I. S/p radioiodine (115 mCi) and subsequently in 2005, 2006, and 2008 had serially negative thyrogen-stimulated whole body scans. f/u- Dr.Solum.   # CKD/ stage III; ? COPD/asthma [Dr.Fleming]; Afib [on eliquis; Dr.Khan]         Thyroid cancer ([place])    INTERVAL HISTORY: Walks independently.  Accompanied by her friend.  Brenda Barber 66 y.o.  female pleasant patient above history of multiple lung nodules currently on surveillance of unclear etiology-is here for follow-up.  In the interim patient underwent ENB/bronchoscopy-in 2023.  Patient continues to have mild cough.  Mild shortness of breath. No nausea vomiting or headaches.   Review of Systems  Constitutional:  Negative for chills, diaphoresis, fever, malaise/fatigue and weight loss.  HENT:  Negative for nosebleeds and sore throat.   Eyes:  Negative for double vision.  Respiratory:  Positive for cough, sputum production and shortness of breath. Negative for hemoptysis and wheezing.   Cardiovascular:  Negative for chest pain, palpitations, orthopnea and leg  swelling.  Gastrointestinal:  Negative for abdominal pain, blood in stool, constipation, diarrhea, heartburn, melena, nausea and vomiting.  Genitourinary:  Negative for dysuria, frequency and urgency.  Musculoskeletal:  Negative for back pain and joint pain.  Skin: Negative.  Negative for itching and rash.  Neurological:  Negative for dizziness, tingling, focal weakness, weakness and headaches.  Endo/Heme/Allergies:  Does not bruise/bleed easily.  Psychiatric/Behavioral:  Negative for depression. The patient is not nervous/anxious and does not have insomnia.       PAST MEDICAL HISTORY :  Past Medical History:  Diagnosis Date   Allergy    Anemia    Asthma    Atrial fibrillation ([place])    Cerebrovascular accident ([place]) 2001   NO RESIDUAL EFFECTS   Chiari I malformation ([place])    COPD (chronic obstructive pulmonary disease) ([place])    Coronary artery disease    Depression    Dyspnea    Dysrhythmia    atrial fib   GERD (gastroesophageal reflux disease)    Heart murmur    Hyperlipidemia    Hypertension    Hypoparathyroidism ([place])    Hypothyroidism    Personality disorder, depressive    Pneumonia    PONV (postoperative nausea and vomiting)    difficulty breathing during endoscopy, colonoscopy performed prior w/out problems-NAUSEA ONLY   Pre-diabetes    Renal insufficiency    Sleep apnea    CPAP   Thyroid cancer ([place]) 1999   Papillary   Thyroid disease    hypothyroid     PAST SURGICAL HISTORY :   Past Surgical History:  Procedure Laterality Date   CANNOT TOLERATE PAP / Virginal     CATARACT EXTRACTION, BILATERAL     ELECTROMAGNETIC NAVIGATION BROCHOSCOPY Left 10/24/2018   Procedure: ELECTROMAGNETIC NAVIGATION BRONCHOSCOPY LEFT;  Surgeon: Patsey Berthold,  Lolita Cram, MD;  Location: [facility];  Service: Cardiopulmonary;  Laterality: Left;   LEFT HEART CATH AND CORONARY ANGIOGRAPHY Right 11/18/2017   Procedure: Left Heart Cath with possible coronary intervention;  Surgeon: Dionisio David,  MD;  Location: [facility] [facility];  Service: Cardiovascular;  Laterality: Right;   MRI brain  12/1999   THYROIDECTOMY     TONSILLECTOMY     TOTAL HIP ARTHROPLASTY Right 06/26/2016   Procedure: TOTAL HIP ARTHROPLASTY ANTERIOR APPROACH;  Surgeon: Hessie Knows, MD;  Location: [facility];  Service: Orthopedics;  Laterality: Right;   VIDEO BRONCHOSCOPY WITH ENDOBRONCHIAL NAVIGATION Left 04/01/2020   Procedure: VIDEO BRONCHOSCOPY WITH ENDOBRONCHIAL NAVIGATION;  Surgeon: Tyler Pita, MD;  Location: [facility];  Service: Pulmonary;  Laterality: Left;   VIDEO BRONCHOSCOPY WITH ENDOBRONCHIAL ULTRASOUND N/A 01/12/2022   Procedure: VIDEO BRONCHOSCOPY WITH ENDOBRONCHIAL ULTRASOUND;  Surgeon: Ottie Glazier, MD;  Location: [facility];  Service: Thoracic;  Laterality: N/A;    FAMILY HISTORY :   Family History  Problem Relation Age of Onset   Diabetes Father    Diabetes Paternal Aunt    Diabetes Paternal Uncle    Breast cancer Sister 38    SOCIAL HISTORY:   Social History   Tobacco Use   Smoking status: Never   Smokeless tobacco: Never  Vaping Use   Vaping Use: Never used  Substance Use Topics   Alcohol use: No   Drug use: No    ALLERGIES:  is allergic to aspirin-dipyridamole er and rosuvastatin.  MEDICATIONS:  Current Outpatient Medications  Medication Sig Dispense Refill   acetaminophen (TYLENOL) 500 MG tablet Take 1,000 mg by mouth every 6 (six) hours as needed for mild pain.      albuterol (PROVENTIL) (2.5 MG/3ML) 0.083% nebulizer solution Take by nebulization.     apixaban (ELIQUIS) 5 MG TABS tablet Take 5 mg by mouth 2 (two) times daily.      atorvastatin (LIPITOR) 80 MG tablet Take 80 mg by mouth at bedtime.      calcitRIOL (ROCALTROL) 0.25 MCG capsule Take by mouth.     Calcium-Phosphorus-Vitamin D (CITRACAL +D3 PO) Take by mouth daily. 2 tablets     cyanocobalamin (VITAMIN B12) 1000 MCG tablet Take by mouth.     diltiazem (TIAZAC) 120 MG 24 hr capsule Take 120 mg by mouth  daily.     donepezil (ARICEPT) 10 MG tablet Take 10 mg by mouth at bedtime.     gemfibrozil (LOPID) 600 MG tablet Take 600 mg by mouth 2 (two) times daily.     hydrocortisone 2.5 % ointment Apply topically.     iron polysaccharides (NIFEREX) 150 MG capsule Take 150 mg by mouth daily.      isosorbide mononitrate (IMDUR) 30 MG 24 hr tablet Take 30 mg by mouth daily.     ketoconazole (NIZORAL) 2 % shampoo Apply topically.     levalbuterol (XOPENEX) 0.63 MG/3ML nebulizer solution Take 0.63 mg by nebulization every 6 (six) hours as needed for wheezing or shortness of breath.     levothyroxine (SYNTHROID) 112 MCG tablet Take 112 mcg by mouth daily.     montelukast (SINGULAIR) 10 MG tablet Take 10 mg by mouth at bedtime.     MULTAQ 400 MG tablet Take 400 mg by mouth 2 (two) times daily.     Multiple Vitamin (MULTIVITAMIN) tablet Take 1 tablet by mouth daily.     nitroGLYCERIN (NITROSTAT) 0.4 MG SL tablet Place 0.4 mg under the tongue every 5 (five)  minutes as needed for chest pain.      potassium chloride (KLOR-CON) 10 MEQ tablet Take 10 mEq by mouth daily.     Propylene Glycol 0.6 % SOLN Place 1 drop into both eyes at bedtime.     sucralfate (CARAFATE) 1 g tablet Take 1 g by mouth 2 (two) times daily.     thiamine 100 MG tablet Take 100 mg by mouth daily.     vitamin C (ASCORBIC ACID) 500 MG tablet Take 500 mg by mouth daily.     No current facility-administered medications for this visit.    PHYSICAL EXAMINATION: ECOG PERFORMANCE STATUS: 1 - Symptomatic but completely ambulatory  BP 137/87 (BP Location: Left Arm, Patient Position: Sitting)   Pulse 71   Temp 97.8 F (36.6 C) (Tympanic)   Resp 18   Wt 142 lb 11.2 oz (64.7 kg)   SpO2 99%   BMI 25.28 kg/m   Filed Weights   03/05/22 1500  Weight: 142 lb 11.2 oz (64.7 kg)    Physical Exam Constitutional:      Comments: Alone.  HENT:     Head: Normocephalic and atraumatic.     Mouth/Throat:     Pharynx: No oropharyngeal exudate.   Eyes:     Pupils: Pupils are equal, round, and reactive to light.  Cardiovascular:     Rate and Rhythm: Normal rate and regular rhythm.  Pulmonary:     Effort: No respiratory distress.     Breath sounds: No wheezing.  Abdominal:     General: Bowel sounds are normal. There is no distension.     Palpations: Abdomen is soft. There is no mass.     Tenderness: There is no abdominal tenderness. There is no guarding or rebound.  Musculoskeletal:        General: No tenderness. Normal range of motion.     Cervical back: Normal range of motion and neck supple.  Skin:    General: Skin is warm.  Neurological:     Mental Status: She is alert and oriented to person, place, and time.  Psychiatric:        Mood and Affect: Affect normal.        LABORATORY DATA:  I have reviewed the data as listed    Component Value Date/Time   NA 146 (H) 01/12/2022 1144   NA 139 09/03/2013 1947   K 3.4 (L) 01/12/2022 1144   K 3.7 09/03/2013 1947   CL 114 (H) 01/12/2022 1144   CL 104 09/03/2013 1947   CO2 24 05/10/2021 0529   CO2 28 09/03/2013 1947   GLUCOSE 79 01/12/2022 1144   GLUCOSE 123 (H) 09/03/2013 1947   BUN 21 01/12/2022 1144   BUN 22 (H) 09/03/2013 1947   CREATININE 1.40 (H) 01/12/2022 1144   CREATININE 0.96 09/03/2013 1947   CALCIUM 8.5 (L) 05/10/2021 0529   CALCIUM 7.7 (L) 09/03/2013 1947   CALCIUM 8.3 (L) 12/19/2009 0000   PROT 6.8 10/06/2019 1328   PROT 7.4 09/03/2013 1947   ALBUMIN 3.9 10/06/2019 1328   ALBUMIN 3.6 09/03/2013 1947   AST 22 10/06/2019 1328   AST 19 09/03/2013 1947   ALT 24 10/06/2019 1328   ALT 22 09/03/2013 1947   ALKPHOS 97 10/06/2019 1328   ALKPHOS 103 09/03/2013 1947   BILITOT 0.7 10/06/2019 1328   BILITOT 0.5 09/03/2013 1947   GFRNONAA 41 (L) 05/10/2021 0529   GFRNONAA >60 09/03/2013 1947   GFRAA 51 (L) 10/06/2019 1328   GFRAA >  60 09/03/2013 1947    No results found for: "SPEP", "UPEP"  Lab Results  Component Value Date   WBC 5.2 01/10/2022    NEUTROABS 5.9 05/09/2021   HGB 9.5 (L) 01/12/2022   HCT 28.0 (L) 01/12/2022   MCV 95.3 01/10/2022   PLT 215 01/10/2022      Chemistry      Component Value Date/Time   NA 146 (H) 01/12/2022 1144   NA 139 09/03/2013 1947   K 3.4 (L) 01/12/2022 1144   K 3.7 09/03/2013 1947   CL 114 (H) 01/12/2022 1144   CL 104 09/03/2013 1947   CO2 24 05/10/2021 0529   CO2 28 09/03/2013 1947   BUN 21 01/12/2022 1144   BUN 22 (H) 09/03/2013 1947   CREATININE 1.40 (H) 01/12/2022 1144   CREATININE 0.96 09/03/2013 1947      Component Value Date/Time   CALCIUM 8.5 (L) 05/10/2021 0529   CALCIUM 7.7 (L) 09/03/2013 1947   CALCIUM 8.3 (L) 12/19/2009 0000   ALKPHOS 97 10/06/2019 1328   ALKPHOS 103 09/03/2013 1947   AST 22 10/06/2019 1328   AST 19 09/03/2013 1947   ALT 24 10/06/2019 1328   ALT 22 09/03/2013 1947   BILITOT 0.7 10/06/2019 1328   BILITOT 0.5 09/03/2013 1947       RADIOGRAPHIC STUDIES: I have personally reviewed the radiological images as listed and agreed with the findings in the report. No results found.   ASSESSMENT & PLAN:  Multiple lung nodules # Multiple lung nodules/ground glass opacities [at least since 2015]; PET  scan April 2023-left lower lobe lesion-PET avid 4.5 concerning for slow-growing adeno ca.  AUG 2023- 14 mm solid left lower lobe pulmonary nodule; Additional smaller solid pulmonary nodules and ground-glass nodules appear unchanged from prior. No new pulmonary nodules. No focal lung infiltrate. No adenopathy.  #Left upper lobe-lipidic growth pattern [noninvasive adenocarcinoma]; left lower lobe solid nodule 14 mm in size nondiagnostic.  Reviewed at the tumor conference.  Clinically, given the solid growth noted in the left lower lobe most suggestive of invasive malignancy.  Discussed that surgery will be prohibitive given her multiple groundglass opacities.  Recommend referral to Dr. Donella Stade for consideration of surgery.   # Thyroid cancer-s/p RAIU [2000; Dr.Solum]-  stable/ no recurrence.Tumor makers- stable; TSH- 0.02 at goal.   #The above plan of care was discussed with Dr. Lanney Gins.  For now patient follow-up with Dr. Therisa Doyne; and follow-up with me only as needed.  # DISPOSITION:  # referral to Dr.Chrystal re: left lower lobe lung cancer # follow up as needed- Dr.B  # I reviewed the blood work- with the patient in detail; also reviewed the imaging independently [as summarized above]; and with the patient in detail.   Cc; Dr.Aleskerov/Hayley   Orders Placed This Encounter  Procedures   Ambulatory referral to Radiation Oncology    Referral Priority:   Routine    Referral Type:   Consultation    Referral Reason:   Specialty Services Required    Requested Specialty:   Radiation Oncology    Number of Visits Requested:   1   All questions were answered. The patient knows to call the clinic with any problems, questions or concerns.      Cammie Sickle, MD 03/05/2022 4:47 PM

## 2022-03-05 NOTE — Assessment & Plan Note (Addendum)
#   Multiple lung nodules/ground glass opacities [at least since 2015]; PET  scan April 2023-left lower lobe lesion-PET avid 4.5 concerning for slow-growing adeno ca.  AUG 2023- 14 mm solid left lower lobe pulmonary nodule; Additional smaller solid pulmonary nodules and ground-glass nodules appear unchanged from prior. No new pulmonary nodules. No focal lung infiltrate. No adenopathy.  #Left upper lobe-lipidic growth pattern [noninvasive adenocarcinoma]; left lower lobe solid nodule 14 mm in size nondiagnostic.  Reviewed at the tumor conference.  Clinically, given the solid growth noted in the left lower lobe most suggestive of invasive malignancy.  Discussed that surgery will be prohibitive given her multiple groundglass opacities.  Recommend referral to Dr. Donella Stade for consideration of surgery.   # Thyroid cancer-s/p RAIU [2000; Dr.Solum]- stable/ no recurrence.Tumor makers- stable; TSH- 0.02 at goal.   #The above plan of care was discussed with Dr. Lanney Gins.  For now patient follow-up with Dr. Therisa Doyne; and follow-up with me only as needed.  # DISPOSITION:  # referral to Dr.Chrystal re: left lower lobe lung cancer # follow up as needed- Dr.B  # I reviewed the blood work- with the patient in detail; also reviewed the imaging independently [as summarized above]; and with the patient in detail.   Cc; Dr.Aleskerov/Hayley

## 2022-03-05 NOTE — Progress Notes (Signed)
Patient had lung biopsy on 01/12/22 ordered by pulmonology and she has not heard about results.    Difficulty swallowing and pulmonologist has mentioned obtaining a swallow test.

## 2022-03-07 ENCOUNTER — Ambulatory Visit
Admission: RE | Admit: 2022-03-07 | Discharge: 2022-03-07 | Disposition: A | Discharge status: Home / Self Care | Payer: Medicare HMO | Source: Ambulatory Visit | Attending: Radiation Oncology | Admitting: Radiation Oncology

## 2022-03-07 VITALS — BP 131/77 | HR 70 | Resp 18 | Ht 63.0 in | Wt 139.1 lb

## 2022-03-07 DIAGNOSIS — R918 Other nonspecific abnormal finding of lung field: Secondary | ICD-10-CM | POA: Insufficient documentation

## 2022-03-07 DIAGNOSIS — Z7901 Long term (current) use of anticoagulants: Secondary | ICD-10-CM | POA: Insufficient documentation

## 2022-03-07 DIAGNOSIS — J449 Chronic obstructive pulmonary disease, unspecified: Secondary | ICD-10-CM | POA: Diagnosis not present

## 2022-03-07 DIAGNOSIS — R0602 Shortness of breath: Secondary | ICD-10-CM | POA: Insufficient documentation

## 2022-03-07 DIAGNOSIS — Z8673 Personal history of transient ischemic attack (TIA), and cerebral infarction without residual deficits: Secondary | ICD-10-CM | POA: Insufficient documentation

## 2022-03-07 DIAGNOSIS — C3432 Malignant neoplasm of lower lobe, left bronchus or lung: Secondary | ICD-10-CM

## 2022-03-07 DIAGNOSIS — K219 Gastro-esophageal reflux disease without esophagitis: Secondary | ICD-10-CM | POA: Diagnosis not present

## 2022-03-07 DIAGNOSIS — Z803 Family history of malignant neoplasm of breast: Secondary | ICD-10-CM | POA: Insufficient documentation

## 2022-03-07 DIAGNOSIS — E209 Hypoparathyroidism, unspecified: Secondary | ICD-10-CM | POA: Diagnosis not present

## 2022-03-07 DIAGNOSIS — I4891 Unspecified atrial fibrillation: Secondary | ICD-10-CM | POA: Diagnosis not present

## 2022-03-07 DIAGNOSIS — G935 Compression of brain: Secondary | ICD-10-CM | POA: Diagnosis not present

## 2022-03-07 DIAGNOSIS — Z79899 Other long term (current) drug therapy: Secondary | ICD-10-CM | POA: Insufficient documentation

## 2022-03-07 DIAGNOSIS — R011 Cardiac murmur, unspecified: Secondary | ICD-10-CM | POA: Insufficient documentation

## 2022-03-07 NOTE — Consult Note (Signed)
NEW PATIENT EVALUATION  Name: Brenda Barber  MRN: [id number]  Date:   03/07/2022     DOB: Feb 27, 1956   This 66 y.o. female patient presents to the clinic for initial evaluation of probable stage I non-small cell lung cancer left lower lobe and 66 year old female with multiple lung nodules.  REFERRING PHYSICIAN: [name], MD  CHIEF COMPLAINT:  Chief Complaint  Patient presents with   Lung Cancer    Consult    DIAGNOSIS: The encounter diagnosis was Cancer of lower lobe of left lung ([place]).   PREVIOUS INVESTIGATIONS:  PET scan and the scan reviewed Pathology and cytology reports reviewed Clinical notes reviewed Case presented at tumor conference  HPI: Patient is a 66 year old female with bilateral lung nodules under observation.  She has a left lower lobe nodule that has become more solid over time measuring 1 to 2 cm.  She underwent bronchoscopy with no diagnosis for malignancy although there was pneumocyte atypia but no diagnostic invasive adenocarcinoma.  On serial CT scans the left lower lobe pulmonary nodule has increased in size and also was hypermetabolic consistent with a slow-growing primary lung cancer.  No evidence for metastatic disease.  She had stable numerous groundglass nodules in both lungs.  Patient does have a chronic slight cough.  She also complains of some shortness of breath and dyspnea on exertion.  No hemoptysis.  She was presented at our weekly tumor conference with recommendation for SBRT to her left lower lobe progressive nodule.  PLANNED TREATMENT REGIMEN: SBRT  PAST MEDICAL HISTORY:  has a past medical history of Allergy, Anemia, Asthma, Atrial fibrillation ([place]), Cerebrovascular accident ([place]) (2001), Chiari I malformation ([place]), COPD (chronic obstructive pulmonary disease) ([place]), Coronary artery disease, Depression, Dyspnea, Dysrhythmia, GERD (gastroesophageal reflux disease), Heart murmur, Hyperlipidemia, Hypertension, Hypoparathyroidism ([place]),  Hypothyroidism, Personality disorder, depressive, Pneumonia, PONV (postoperative nausea and vomiting), Pre-diabetes, Renal insufficiency, Sleep apnea, Thyroid cancer ([place]) (1999), and Thyroid disease.    PAST SURGICAL HISTORY:  Past Surgical History:  Procedure Laterality Date   CANNOT TOLERATE PAP / Virginal     CATARACT EXTRACTION, BILATERAL     ELECTROMAGNETIC NAVIGATION BROCHOSCOPY Left 10/24/2018   Procedure: ELECTROMAGNETIC NAVIGATION BRONCHOSCOPY LEFT;  Surgeon: Tyler Pita, MD;  Location: [facility];  Service: Cardiopulmonary;  Laterality: Left;   LEFT HEART CATH AND CORONARY ANGIOGRAPHY Right 11/18/2017   Procedure: Left Heart Cath with possible coronary intervention;  Surgeon: Dionisio David, MD;  Location: [place] [facility];  Service: Cardiovascular;  Laterality: Right;   MRI brain  12/1999   THYROIDECTOMY     TONSILLECTOMY     TOTAL HIP ARTHROPLASTY Right 06/26/2016   Procedure: TOTAL HIP ARTHROPLASTY ANTERIOR APPROACH;  Surgeon: Hessie Knows, MD;  Location: [facility];  Service: Orthopedics;  Laterality: Right;   VIDEO BRONCHOSCOPY WITH ENDOBRONCHIAL NAVIGATION Left 04/01/2020   Procedure: VIDEO BRONCHOSCOPY WITH ENDOBRONCHIAL NAVIGATION;  Surgeon: Tyler Pita, MD;  Location: [facility];  Service: Pulmonary;  Laterality: Left;   VIDEO BRONCHOSCOPY WITH ENDOBRONCHIAL ULTRASOUND N/A 01/12/2022   Procedure: VIDEO BRONCHOSCOPY WITH ENDOBRONCHIAL ULTRASOUND;  Surgeon: Ottie Glazier, MD;  Location: [facility];  Service: Thoracic;  Laterality: N/A;    FAMILY HISTORY: family history includes Breast cancer (age of onset: 70) in her sister; Diabetes in her father, paternal aunt, and paternal uncle.  SOCIAL HISTORY:  reports that she has never smoked. She has never used smokeless tobacco. She reports that she does not drink alcohol and does not use drugs.  ALLERGIES: Aspirin-dipyridamole er and  Rosuvastatin  MEDICATIONS:  Current Outpatient Medications  Medication Sig Dispense  Refill   acetaminophen (TYLENOL) 500 MG tablet Take 1,000 mg by mouth every 6 (six) hours as needed for mild pain.      albuterol (PROVENTIL) (2.5 MG/3ML) 0.083% nebulizer solution Take by nebulization.     apixaban (ELIQUIS) 5 MG TABS tablet Take 5 mg by mouth 2 (two) times daily.      atorvastatin (LIPITOR) 80 MG tablet Take 80 mg by mouth at bedtime.      calcitRIOL (ROCALTROL) 0.25 MCG capsule Take by mouth.     Calcium-Phosphorus-Vitamin D (CITRACAL +D3 PO) Take by mouth daily. 2 tablets     cyanocobalamin (VITAMIN B12) 1000 MCG tablet Take by mouth.     diltiazem (TIAZAC) 120 MG 24 hr capsule Take 120 mg by mouth daily.     donepezil (ARICEPT) 10 MG tablet Take 10 mg by mouth at bedtime.     gemfibrozil (LOPID) 600 MG tablet Take 600 mg by mouth 2 (two) times daily.     hydrocortisone 2.5 % ointment Apply topically.     iron polysaccharides (NIFEREX) 150 MG capsule Take 150 mg by mouth daily.      isosorbide mononitrate (IMDUR) 30 MG 24 hr tablet Take 30 mg by mouth daily.     ketoconazole (NIZORAL) 2 % shampoo Apply topically.     levalbuterol (XOPENEX) 0.63 MG/3ML nebulizer solution Take 0.63 mg by nebulization every 6 (six) hours as needed for wheezing or shortness of breath.     levothyroxine (SYNTHROID) 112 MCG tablet Take 112 mcg by mouth daily.     montelukast (SINGULAIR) 10 MG tablet Take 10 mg by mouth at bedtime.     MULTAQ 400 MG tablet Take 400 mg by mouth 2 (two) times daily.     Multiple Vitamin (MULTIVITAMIN) tablet Take 1 tablet by mouth daily.     nitroGLYCERIN (NITROSTAT) 0.4 MG SL tablet Place 0.4 mg under the tongue every 5 (five) minutes as needed for chest pain.      potassium chloride (KLOR-CON) 10 MEQ tablet Take 10 mEq by mouth daily.     Propylene Glycol 0.6 % SOLN Place 1 drop into both eyes at bedtime.     sucralfate (CARAFATE) 1 g tablet Take 1 g by mouth 2 (two) times daily.     thiamine 100 MG tablet Take 100 mg by mouth daily.     vitamin C (ASCORBIC  ACID) 500 MG tablet Take 500 mg by mouth daily.     No current facility-administered medications for this encounter.    ECOG PERFORMANCE STATUS:  0 - Asymptomatic  REVIEW OF SYSTEMS: Patient denies any weight loss, fatigue, weakness, fever, chills or night sweats. Patient denies any loss of vision, blurred vision. Patient denies any ringing  of the ears or hearing loss. No irregular heartbeat. Patient denies heart murmur or history of fainting. Patient denies any chest pain or pain radiating to her upper extremities. Patient denies any shortness of breath, difficulty breathing at night, cough or hemoptysis. Patient denies any swelling in the lower legs. Patient denies any nausea vomiting, vomiting of blood, or coffee ground material in the vomitus. Patient denies any stomach pain. Patient states has had normal bowel movements no significant constipation or diarrhea. Patient denies any dysuria, hematuria or significant nocturia. Patient denies any problems walking, swelling in the joints or loss of balance. Patient denies any skin changes, loss of hair or loss of weight. Patient denies any excessive  worrying or anxiety or significant depression. Patient denies any problems with insomnia. Patient denies excessive thirst, polyuria, polydipsia. Patient denies any swollen glands, patient denies easy bruising or easy bleeding. Patient denies any recent infections, allergies or URI. Patient "s visual fields have not changed significantly in recent time.   PHYSICAL EXAM: BP 131/77   Pulse 70   Resp 18   Ht 5\' 3"  (1.6 m)   Wt 139 lb 1.6 oz (63.1 kg)   BMI 24.64 kg/m  Well-developed well-nourished patient in NAD. HEENT reveals PERLA, EOMI, discs not visualized.  Oral cavity is clear. No oral mucosal lesions are identified. Neck is clear without evidence of cervical or supraclavicular adenopathy. Lungs are clear to A&P. Cardiac examination is essentially unremarkable with regular rate and rhythm without  murmur rub or thrill. Abdomen is benign with no organomegaly or masses noted. Motor sensory and DTR levels are equal and symmetric in the upper and lower extremities. Cranial nerves II through XII are grossly intact. Proprioception is intact. No peripheral adenopathy or edema is identified. No motor or sensory levels are noted. Crude visual fields are within normal range.  LABORATORY DATA: Pathology reports and cytology reports reviewed    RADIOLOGY RESULTS: Serial CT scans and PET CT scans reviewed compatible with above-stated findings   IMPRESSION: Stage I non-small cell lung cancer left lower lobe in 66 year old female progressing over time  PLAN: At this time like to go ahead with SBRT to her left lower lobe would plan on delivering 60 Gray in 5 fractions.  Would use motion restriction for dimensional treatment planning in simulation.  Risks and benefits of treatment including low side effect profile of SBRT were reviewed with the patient.  She may develop a slight cough after treatment.  Patient comprehends my recommendations well if personally set up and ordered CT simulation for next week.  I would like to take this opportunity to thank you for allowing me to participate in the care of your patient.Noreene Filbert, MD

## 2022-03-12 DIAGNOSIS — C3432 Malignant neoplasm of lower lobe, left bronchus or lung: Secondary | ICD-10-CM | POA: Diagnosis not present

## 2022-03-12 DIAGNOSIS — Z510 Encounter for antineoplastic radiation therapy: Secondary | ICD-10-CM | POA: Diagnosis not present

## 2022-03-14 ENCOUNTER — Ambulatory Visit: Payer: Medicare HMO

## 2022-03-14 ENCOUNTER — Encounter: Payer: Medicare HMO | Attending: Nephrology | Admitting: Dietician

## 2022-03-14 ENCOUNTER — Encounter: Payer: Self-pay | Admitting: Dietician

## 2022-03-14 VITALS — Ht 63.0 in | Wt 140.9 lb

## 2022-03-14 DIAGNOSIS — R7303 Prediabetes: Secondary | ICD-10-CM | POA: Insufficient documentation

## 2022-03-14 DIAGNOSIS — Z713 Dietary counseling and surveillance: Secondary | ICD-10-CM | POA: Insufficient documentation

## 2022-03-14 DIAGNOSIS — I129 Hypertensive chronic kidney disease with stage 1 through stage 4 chronic kidney disease, or unspecified chronic kidney disease: Secondary | ICD-10-CM | POA: Diagnosis not present

## 2022-03-14 DIAGNOSIS — I2510 Atherosclerotic heart disease of native coronary artery without angina pectoris: Secondary | ICD-10-CM | POA: Diagnosis not present

## 2022-03-14 DIAGNOSIS — Z85850 Personal history of malignant neoplasm of thyroid: Secondary | ICD-10-CM | POA: Diagnosis not present

## 2022-03-14 DIAGNOSIS — I10 Essential (primary) hypertension: Secondary | ICD-10-CM

## 2022-03-14 DIAGNOSIS — E785 Hyperlipidemia, unspecified: Secondary | ICD-10-CM | POA: Insufficient documentation

## 2022-03-14 DIAGNOSIS — N1832 Chronic kidney disease, stage 3b: Secondary | ICD-10-CM | POA: Insufficient documentation

## 2022-03-14 NOTE — Patient Instructions (Signed)
Check food labels for sodium, aim for an average of 500mg  for a meal. Some meals can be less, a few meals can be more.  Make soups with low sodium broth, use frozen veggies for soups and other meals.  Keep portions of meats to the size of the palm of the hand or slightly less. Use mostly fresh meats or unseasoned frozen meats.  Gradually increase light exercise to help control blood pressure and preserve kidney function. Call Pam with any questions or to schedule another visit.

## 2022-03-14 NOTE — Progress Notes (Signed)
Medical Nutrition Therapy: Visit start time: 0762  end time: 1415  Assessment:   Referral Diagnosis: kidney disease stage 3b Other medical history/ diagnoses: hypertension, hyperlipidemia, atherosclerosis, lung nodules, prediabetes Psychosocial issues/ stress concerns:   Medications, supplements: reconciled list in medical record   Preferred learning method:  Auditory Visual Hands-on    Current weight: 140.9lbs Height: 5'3" BMI: 24.96 Patient's personal weight goal: around 130lbs  Progress and evaluation:  Patient reports she will be having radiation treatment for lung nodules She has begun working on dietary changes to preserve kidney function and decrease other health risks, including eliminating sweets, breads, increasing vegetables, drinking more water. Roommate is following vegan diet; makes vegan, low sugar cookies and provides support for patient Lab results on 01/29/22 indicate glucose 100, sodium 147, potassium 4.4, chloride 112, BUN 27, creatinine 1.4, GFR 38; 11/28/21 glucose 103, sodium 146, potassium 3.5, chloride 110, BUN 31, creatinine 1.9, GFR 27, calcium 10.8, alk phosphatase 120, HbA1C 5.7%, total cholesterol 179, HDL 64, LDL 97, triglycerides 90 (taking atorvastatin) Food allergies: none Special diet practices: none Patient seeks help with preserving kidney function, preventing diabetes, promoting heart health Next PCP appt is 03/2022   Dietary Intake:  Usual eating pattern includes 3 meals and 2 snacks per day. Dining out frequency: 0-1 meals per week. Who plans meals/ buys groceries? Self Who prepares meals? self  Breakfast: oatmeal with blueberries; low sodium [place] bacon Snack: sometimes fruit  Lunch: chicken + veg Snack: granola bar Supper: lighter/ smaller meal -- leftovers from lunch ie baked chicken + veg; occ fish at restaurant Snack: none Beverages: water, some with flavoring  Physical activity: sitting pedal machine-- ? Duration or frequency,  plans to increase to 30 minutes most days per PT advice; states she also needs to start walking, considering walking inside mall   Intervention:   Nutrition Care Education:   Basic nutrition: basic food groups; appropriate nutrient balance; appropriate meal and snack schedule; general nutrition guidelines    Advanced nutrition:  low sodium cooking techniques; food label reading for sodium Diabetes prevention:  appropriate carb intake and balance, healthy carb choices;  CKD and Hypertension:  importance of controlling BP; identifying high sodium foods, goal for sodium intake; identifying food sources of potassium, magnesium; options for seasoning foods; controlling portions of protein foods; role of exercise in controlling BP   Other intervention notes: Patient has begun making lifestyle changes; she is reading materials provided by medical providers and is motivated to continue working on low sodium, carb-controlled eating pattern Established additional nutrition goals with input from patient. Due to upcoming radiation treatments, we did not schedule a follow up visit. Patient will schedule later as needed.   Nutritional Diagnosis:  [place]-2.1 Inpaired nutrition utilization and [place]-2.2 Altered nutrition-related laboratory As related to chronic kidney disease, hypertension, hyperlipidemia, and prediabetes.  As evidenced by low GFR, elevated BUN and creatinine, elevated HbA1C, cholesterol and triglycerides controlled with medication.   Education Materials given:  Low Sodium Nutrition Therapy (NCM) Plate Planner with food lists, sample meal pattern Sample menus Visit summary with goals/ instructions   Learner/ who was taught:  Patient   Level of understanding: Verbalizes/ demonstrates competency  Demonstrated degree of understanding via:   Teach back Learning barriers: Memory deficit per medical notes; provided written materials and goals  Willingness to learn/ readiness for  change: Eager, change in progress   Monitoring and Evaluation:  Dietary intake, exercise, renal function, BP control, BG control, blood lipids, and body weight  follow up: prn

## 2022-03-15 ENCOUNTER — Ambulatory Visit
Admission: RE | Admit: 2022-03-15 | Discharge: 2022-03-15 | Disposition: A | Discharge status: Home / Self Care | Payer: Medicare HMO | Source: Ambulatory Visit | Attending: Radiation Oncology | Admitting: Radiation Oncology

## 2022-03-15 DIAGNOSIS — Z510 Encounter for antineoplastic radiation therapy: Secondary | ICD-10-CM | POA: Diagnosis not present

## 2022-03-15 DIAGNOSIS — C3432 Malignant neoplasm of lower lobe, left bronchus or lung: Secondary | ICD-10-CM | POA: Insufficient documentation

## 2022-03-20 DIAGNOSIS — C3432 Malignant neoplasm of lower lobe, left bronchus or lung: Secondary | ICD-10-CM | POA: Diagnosis not present

## 2022-03-20 DIAGNOSIS — Z510 Encounter for antineoplastic radiation therapy: Secondary | ICD-10-CM | POA: Diagnosis not present

## 2022-03-21 DIAGNOSIS — R809 Proteinuria, unspecified: Secondary | ICD-10-CM | POA: Diagnosis not present

## 2022-03-21 DIAGNOSIS — R8290 Unspecified abnormal findings in urine: Secondary | ICD-10-CM | POA: Diagnosis not present

## 2022-03-21 DIAGNOSIS — D631 Anemia in chronic kidney disease: Secondary | ICD-10-CM | POA: Diagnosis not present

## 2022-03-21 DIAGNOSIS — N1832 Chronic kidney disease, stage 3b: Secondary | ICD-10-CM | POA: Diagnosis not present

## 2022-03-21 DIAGNOSIS — E785 Hyperlipidemia, unspecified: Secondary | ICD-10-CM | POA: Diagnosis not present

## 2022-03-21 DIAGNOSIS — G4733 Obstructive sleep apnea (adult) (pediatric): Secondary | ICD-10-CM | POA: Diagnosis not present

## 2022-03-21 DIAGNOSIS — I639 Cerebral infarction, unspecified: Secondary | ICD-10-CM | POA: Diagnosis not present

## 2022-03-21 DIAGNOSIS — I10 Essential (primary) hypertension: Secondary | ICD-10-CM | POA: Diagnosis not present

## 2022-03-26 ENCOUNTER — Ambulatory Visit
Admission: RE | Admit: 2022-03-26 | Discharge: 2022-03-26 | Disposition: A | Discharge status: Home / Self Care | Payer: Medicare HMO | Source: Ambulatory Visit | Attending: Radiation Oncology | Admitting: Radiation Oncology

## 2022-03-26 ENCOUNTER — Other Ambulatory Visit: Payer: Self-pay

## 2022-03-26 DIAGNOSIS — Z510 Encounter for antineoplastic radiation therapy: Secondary | ICD-10-CM | POA: Diagnosis not present

## 2022-03-26 DIAGNOSIS — C3432 Malignant neoplasm of lower lobe, left bronchus or lung: Secondary | ICD-10-CM | POA: Insufficient documentation

## 2022-03-26 LAB — RAD ONC ARIA SESSION SUMMARY
Course Elapsed Days: 0
Plan Fractions Treated to Date: 1
Plan Prescribed Dose Per Fraction: 12 Gy
Plan Total Fractions Prescribed: 5
Plan Total Prescribed Dose: 60 Gy
Reference Point Dosage Given to Date: 12 Gy
Reference Point Session Dosage Given: 12 Gy
Session Number: 1

## 2022-03-28 ENCOUNTER — Other Ambulatory Visit: Payer: Self-pay

## 2022-03-28 ENCOUNTER — Ambulatory Visit
Admission: RE | Admit: 2022-03-28 | Discharge: 2022-03-28 | Disposition: A | Discharge status: Home / Self Care | Payer: Medicare HMO | Source: Ambulatory Visit | Attending: Radiation Oncology | Admitting: Radiation Oncology

## 2022-03-28 DIAGNOSIS — C3432 Malignant neoplasm of lower lobe, left bronchus or lung: Secondary | ICD-10-CM | POA: Diagnosis not present

## 2022-03-28 LAB — RAD ONC ARIA SESSION SUMMARY
Course Elapsed Days: 2
Plan Fractions Treated to Date: 2
Plan Prescribed Dose Per Fraction: 12 Gy
Plan Total Fractions Prescribed: 5
Plan Total Prescribed Dose: 60 Gy
Reference Point Dosage Given to Date: 24 Gy
Reference Point Session Dosage Given: 12 Gy
Session Number: 2

## 2022-03-29 DIAGNOSIS — N1832 Chronic kidney disease, stage 3b: Secondary | ICD-10-CM | POA: Diagnosis not present

## 2022-03-29 DIAGNOSIS — I639 Cerebral infarction, unspecified: Secondary | ICD-10-CM | POA: Diagnosis not present

## 2022-03-29 DIAGNOSIS — E785 Hyperlipidemia, unspecified: Secondary | ICD-10-CM | POA: Diagnosis not present

## 2022-03-29 DIAGNOSIS — R809 Proteinuria, unspecified: Secondary | ICD-10-CM | POA: Diagnosis not present

## 2022-03-29 DIAGNOSIS — G4733 Obstructive sleep apnea (adult) (pediatric): Secondary | ICD-10-CM | POA: Diagnosis not present

## 2022-03-29 DIAGNOSIS — D631 Anemia in chronic kidney disease: Secondary | ICD-10-CM | POA: Diagnosis not present

## 2022-03-29 DIAGNOSIS — I10 Essential (primary) hypertension: Secondary | ICD-10-CM | POA: Diagnosis not present

## 2022-03-29 DIAGNOSIS — R8290 Unspecified abnormal findings in urine: Secondary | ICD-10-CM | POA: Diagnosis not present

## 2022-04-02 ENCOUNTER — Other Ambulatory Visit: Payer: Self-pay

## 2022-04-02 ENCOUNTER — Ambulatory Visit
Admission: RE | Admit: 2022-04-02 | Discharge: 2022-04-02 | Disposition: A | Discharge status: Home / Self Care | Payer: Medicare HMO | Source: Ambulatory Visit | Attending: Radiation Oncology | Admitting: Radiation Oncology

## 2022-04-02 DIAGNOSIS — C3432 Malignant neoplasm of lower lobe, left bronchus or lung: Secondary | ICD-10-CM | POA: Diagnosis not present

## 2022-04-02 LAB — RAD ONC ARIA SESSION SUMMARY
Course Elapsed Days: 7
Plan Fractions Treated to Date: 3
Plan Prescribed Dose Per Fraction: 12 Gy
Plan Total Fractions Prescribed: 5
Plan Total Prescribed Dose: 60 Gy
Reference Point Dosage Given to Date: 36 Gy
Reference Point Session Dosage Given: 12 Gy
Session Number: 3

## 2022-04-04 ENCOUNTER — Other Ambulatory Visit: Payer: Self-pay

## 2022-04-04 ENCOUNTER — Ambulatory Visit
Admission: RE | Admit: 2022-04-04 | Discharge: 2022-04-04 | Disposition: A | Discharge status: Home / Self Care | Payer: Medicare HMO | Source: Ambulatory Visit | Attending: Radiation Oncology | Admitting: Radiation Oncology

## 2022-04-04 DIAGNOSIS — Q0700 Arnold-Chiari syndrome without spina bifida or hydrocephalus: Secondary | ICD-10-CM | POA: Diagnosis not present

## 2022-04-04 DIAGNOSIS — R911 Solitary pulmonary nodule: Secondary | ICD-10-CM | POA: Diagnosis not present

## 2022-04-04 DIAGNOSIS — I10 Essential (primary) hypertension: Secondary | ICD-10-CM | POA: Diagnosis not present

## 2022-04-04 DIAGNOSIS — M8589 Other specified disorders of bone density and structure, multiple sites: Secondary | ICD-10-CM | POA: Diagnosis not present

## 2022-04-04 DIAGNOSIS — R7309 Other abnormal glucose: Secondary | ICD-10-CM | POA: Diagnosis not present

## 2022-04-04 DIAGNOSIS — G4733 Obstructive sleep apnea (adult) (pediatric): Secondary | ICD-10-CM | POA: Diagnosis not present

## 2022-04-04 DIAGNOSIS — D649 Anemia, unspecified: Secondary | ICD-10-CM | POA: Diagnosis not present

## 2022-04-04 DIAGNOSIS — Z79899 Other long term (current) drug therapy: Secondary | ICD-10-CM | POA: Diagnosis not present

## 2022-04-04 DIAGNOSIS — C3432 Malignant neoplasm of lower lobe, left bronchus or lung: Secondary | ICD-10-CM | POA: Diagnosis not present

## 2022-04-04 LAB — RAD ONC ARIA SESSION SUMMARY
Course Elapsed Days: 9
Plan Fractions Treated to Date: 4
Plan Prescribed Dose Per Fraction: 12 Gy
Plan Total Fractions Prescribed: 5
Plan Total Prescribed Dose: 60 Gy
Reference Point Dosage Given to Date: 48 Gy
Reference Point Session Dosage Given: 12 Gy
Session Number: 4

## 2022-04-09 ENCOUNTER — Ambulatory Visit
Admission: RE | Admit: 2022-04-09 | Discharge: 2022-04-09 | Disposition: A | Discharge status: Home / Self Care | Payer: Medicare HMO | Source: Ambulatory Visit | Attending: Radiation Oncology | Admitting: Radiation Oncology

## 2022-04-09 ENCOUNTER — Other Ambulatory Visit: Payer: Self-pay

## 2022-04-09 DIAGNOSIS — C3432 Malignant neoplasm of lower lobe, left bronchus or lung: Secondary | ICD-10-CM | POA: Diagnosis not present

## 2022-04-09 LAB — RAD ONC ARIA SESSION SUMMARY
Course Elapsed Days: 14
Plan Fractions Treated to Date: 5
Plan Prescribed Dose Per Fraction: 12 Gy
Plan Total Fractions Prescribed: 5
Plan Total Prescribed Dose: 60 Gy
Reference Point Dosage Given to Date: 60 Gy
Reference Point Session Dosage Given: 12 Gy
Session Number: 5

## 2022-04-11 DIAGNOSIS — Z0000 Encounter for general adult medical examination without abnormal findings: Secondary | ICD-10-CM | POA: Diagnosis not present

## 2022-04-11 DIAGNOSIS — S80212A Abrasion, left knee, initial encounter: Secondary | ICD-10-CM | POA: Diagnosis not present

## 2022-04-11 DIAGNOSIS — G4733 Obstructive sleep apnea (adult) (pediatric): Secondary | ICD-10-CM | POA: Diagnosis not present

## 2022-04-11 DIAGNOSIS — I480 Paroxysmal atrial fibrillation: Secondary | ICD-10-CM | POA: Diagnosis not present

## 2022-04-11 DIAGNOSIS — E892 Postprocedural hypoparathyroidism: Secondary | ICD-10-CM | POA: Diagnosis not present

## 2022-04-11 DIAGNOSIS — Z23 Encounter for immunization: Secondary | ICD-10-CM | POA: Diagnosis not present

## 2022-04-11 DIAGNOSIS — G935 Compression of brain: Secondary | ICD-10-CM | POA: Diagnosis not present

## 2022-04-11 DIAGNOSIS — Z9181 History of falling: Secondary | ICD-10-CM | POA: Diagnosis not present

## 2022-04-11 DIAGNOSIS — R918 Other nonspecific abnormal finding of lung field: Secondary | ICD-10-CM | POA: Diagnosis not present

## 2022-04-16 ENCOUNTER — Inpatient Hospital Stay: Payer: Medicare HMO | Attending: Internal Medicine | Admitting: Hospice and Palliative Medicine

## 2022-04-16 ENCOUNTER — Telehealth: Payer: Self-pay | Admitting: *Deleted

## 2022-04-16 DIAGNOSIS — R059 Cough, unspecified: Secondary | ICD-10-CM

## 2022-04-16 NOTE — Telephone Encounter (Signed)
Patient called reporting that she has developed a cough since completing her radiation therapy last week and is asking what she can take for it. She is coughing up a lot of clear phlegm, denies fever. Please advise

## 2022-04-16 NOTE — Progress Notes (Signed)
Virtual Visit via Telephone Note  I connected with Brenda Barber on 04/16/22 at  1:00 PM EST by telephone and verified that I am speaking with the correct person using two identifiers.  Location: Patient: Home Provider: Clinic   I discussed the limitations, risks, security and privacy concerns of performing an evaluation and management service by telephone and the availability of in person appointments. I also discussed with the patient that there may be a patient responsible charge related to this service. The patient expressed understanding and agreed to proceed.   History of Present Illness: Brenda Barber is a 66 year old woman with multiple medical problems including history of bilateral lung nodules under observation with progression of the left lower lobe nodule.  Patient underwent bronchoscopy with no definitive diagnosis for malignancy although subsequent scans concerning for slow-growing primary lung cancer.  Patient was referred for XRT.   Observations/Objective: Patient called in clinic today requesting virtual visit for cough since she completed XRT.  Patient reports rhinorrhea and productive cough with white/clear phlegm.  She denies fever or chills.  No shortness of breath or wheezing.  No GI or GU symptoms.  Patient is not currently taking any medications for cough or symptoms.  Assessment and Plan: Cough -given concurrent symptoms of rhinorrhea, this is more suspicious of viral etiology.  Patient did take a home COVID test today, which was reportedly negative.  We discussed symptom management/supportive care at home for possible viral URI including OTC antitussives/expectorants.  Discussed ED triggers.  Patient verbalized understanding and was in agreement with plan.  Follow Up Instructions: As needed   I discussed the assessment and treatment plan with the patient. The patient was provided an opportunity to ask questions and all were answered. The patient agreed with the plan and  demonstrated an understanding of the instructions.   The patient was advised to call back or seek an in-person evaluation if the symptoms worsen or if the condition fails to improve as anticipated.  I provided 10 minutes of non-face-to-face time during this encounter.   Irean Hong, NP

## 2022-04-16 NOTE — Telephone Encounter (Signed)
Apt made for patient. Pt prefers telephone visit only. She does not have a computer or any device available for a virtual visit. She has a home covid test available and her room mate will assist her in performing this test.

## 2022-04-23 DIAGNOSIS — L821 Other seborrheic keratosis: Secondary | ICD-10-CM | POA: Diagnosis not present

## 2022-04-23 DIAGNOSIS — D2262 Melanocytic nevi of left upper limb, including shoulder: Secondary | ICD-10-CM | POA: Diagnosis not present

## 2022-04-23 DIAGNOSIS — D2261 Melanocytic nevi of right upper limb, including shoulder: Secondary | ICD-10-CM | POA: Diagnosis not present

## 2022-04-23 DIAGNOSIS — D2272 Melanocytic nevi of left lower limb, including hip: Secondary | ICD-10-CM | POA: Diagnosis not present

## 2022-04-23 DIAGNOSIS — D2271 Melanocytic nevi of right lower limb, including hip: Secondary | ICD-10-CM | POA: Diagnosis not present

## 2022-04-23 DIAGNOSIS — L218 Other seborrheic dermatitis: Secondary | ICD-10-CM | POA: Diagnosis not present

## 2022-04-23 DIAGNOSIS — D225 Melanocytic nevi of trunk: Secondary | ICD-10-CM | POA: Diagnosis not present

## 2022-04-23 DIAGNOSIS — L538 Other specified erythematous conditions: Secondary | ICD-10-CM | POA: Diagnosis not present

## 2022-04-23 DIAGNOSIS — L820 Inflamed seborrheic keratosis: Secondary | ICD-10-CM | POA: Diagnosis not present

## 2022-05-09 DIAGNOSIS — R058 Other specified cough: Secondary | ICD-10-CM | POA: Diagnosis not present

## 2022-05-10 DIAGNOSIS — E785 Hyperlipidemia, unspecified: Secondary | ICD-10-CM | POA: Diagnosis not present

## 2022-05-10 DIAGNOSIS — I10 Essential (primary) hypertension: Secondary | ICD-10-CM | POA: Diagnosis not present

## 2022-05-10 DIAGNOSIS — R0602 Shortness of breath: Secondary | ICD-10-CM | POA: Diagnosis not present

## 2022-05-10 DIAGNOSIS — I4891 Unspecified atrial fibrillation: Secondary | ICD-10-CM | POA: Diagnosis not present

## 2022-05-10 DIAGNOSIS — I340 Nonrheumatic mitral (valve) insufficiency: Secondary | ICD-10-CM | POA: Diagnosis not present

## 2022-05-10 DIAGNOSIS — I2510 Atherosclerotic heart disease of native coronary artery without angina pectoris: Secondary | ICD-10-CM | POA: Diagnosis not present

## 2022-05-22 ENCOUNTER — Encounter: Payer: Self-pay | Admitting: Radiation Oncology

## 2022-05-22 ENCOUNTER — Ambulatory Visit
Admission: RE | Admit: 2022-05-22 | Discharge: 2022-05-22 | Disposition: A | Discharge status: Home / Self Care | Payer: Medicare HMO | Source: Ambulatory Visit | Attending: Radiation Oncology | Admitting: Radiation Oncology

## 2022-05-22 VITALS — BP 146/82 | HR 72 | Temp 97.0°F | Resp 20 | Ht 63.0 in | Wt 138.3 lb

## 2022-05-22 DIAGNOSIS — C3432 Malignant neoplasm of lower lobe, left bronchus or lung: Secondary | ICD-10-CM | POA: Diagnosis not present

## 2022-05-22 NOTE — Progress Notes (Signed)
Radiation Oncology Follow up Note  Name: Brenda Barber   Date:   05/22/2022 MRN:  [id number] DOB: 1955-05-31    This 67 y.o. female presents to the clinic today for 1 month follow-up status post SBRT.  REFERRING PROVIDER: [name], MD  HPI: Patient is a 67 year old female now out 1 month having completed SBRT for stage I non-small cell lung cancer left lower lobe.  She is and has known multiple lung nodules which were following seen today in routine follow-up she is doing well still has a slight cough which we expect at this time.  She specifically [place] hemoptysis or any change in her overall pulmonary status..  COMPLICATIONS OF TREATMENT: none  FOLLOW UP COMPLIANCE: keeps appointments   PHYSICAL EXAM:  BP (!) 146/82   Pulse 72   Temp (!) 97 F (36.1 C)   Resp 20   Ht 5\' 3"  (1.6 m)   Wt 138 lb 4.8 oz (62.7 kg)   BMI 24.50 kg/m  Well-developed well-nourished patient in NAD. HEENT reveals PERLA, EOMI, discs not visualized.  Oral cavity is clear. No oral mucosal lesions are identified. Neck is clear without evidence of cervical or supraclavicular adenopathy. Lungs are clear to A&P. Cardiac examination is essentially unremarkable with regular rate and rhythm without murmur rub or thrill. Abdomen is benign with no organomegaly or masses noted. Motor sensory and DTR levels are equal and symmetric in the upper and lower extremities. Cranial nerves II through XII are grossly intact. Proprioception is intact. No peripheral adenopathy or edema is identified. No motor or sensory levels are noted. Crude visual fields are within normal range.  RADIOLOGY RESULTS: CT scan of the chest ordered  PLAN: At this time I have asked to see her back in 3 months for follow-up and a CT scan of her chest prior to that visit.  Overall she is done well with a low side effect profile.  I am pleased with her overall progress.  Appointments for CT scan and follow-up were arranged.  Patient is to call with any  concerns.  I would like to take this opportunity to thank you for allowing me to participate in the care of your patient.Noreene Filbert, MD

## 2022-06-25 ENCOUNTER — Other Ambulatory Visit: Payer: Self-pay | Admitting: Cardiovascular Disease

## 2022-06-25 ENCOUNTER — Other Ambulatory Visit: Payer: Self-pay

## 2022-06-25 MED ORDER — APIXABAN 5 MG PO TABS: 5.0000 mg | ORAL_TABLET | Freq: Two times a day (BID) | ORAL | 0 refills | Status: DC

## 2022-06-26 DIAGNOSIS — I10 Essential (primary) hypertension: Secondary | ICD-10-CM | POA: Diagnosis not present

## 2022-06-26 DIAGNOSIS — E785 Hyperlipidemia, unspecified: Secondary | ICD-10-CM | POA: Diagnosis not present

## 2022-06-26 DIAGNOSIS — I639 Cerebral infarction, unspecified: Secondary | ICD-10-CM | POA: Diagnosis not present

## 2022-06-26 DIAGNOSIS — N1832 Chronic kidney disease, stage 3b: Secondary | ICD-10-CM | POA: Diagnosis not present

## 2022-06-26 DIAGNOSIS — G4733 Obstructive sleep apnea (adult) (pediatric): Secondary | ICD-10-CM | POA: Diagnosis not present

## 2022-06-26 DIAGNOSIS — R8290 Unspecified abnormal findings in urine: Secondary | ICD-10-CM | POA: Diagnosis not present

## 2022-06-26 DIAGNOSIS — R809 Proteinuria, unspecified: Secondary | ICD-10-CM | POA: Diagnosis not present

## 2022-07-03 DIAGNOSIS — G4733 Obstructive sleep apnea (adult) (pediatric): Secondary | ICD-10-CM | POA: Diagnosis not present

## 2022-07-03 DIAGNOSIS — N1832 Chronic kidney disease, stage 3b: Secondary | ICD-10-CM | POA: Diagnosis not present

## 2022-07-03 DIAGNOSIS — I639 Cerebral infarction, unspecified: Secondary | ICD-10-CM | POA: Diagnosis not present

## 2022-07-03 DIAGNOSIS — I10 Essential (primary) hypertension: Secondary | ICD-10-CM | POA: Diagnosis not present

## 2022-07-03 DIAGNOSIS — R809 Proteinuria, unspecified: Secondary | ICD-10-CM | POA: Diagnosis not present

## 2022-07-03 DIAGNOSIS — E785 Hyperlipidemia, unspecified: Secondary | ICD-10-CM | POA: Diagnosis not present

## 2022-07-03 DIAGNOSIS — D631 Anemia in chronic kidney disease: Secondary | ICD-10-CM | POA: Diagnosis not present

## 2022-07-06 ENCOUNTER — Telehealth: Payer: Self-pay | Admitting: *Deleted

## 2022-07-06 NOTE — Patient Outreach (Signed)
  Care Coordination   07/06/2022 Name: Brenda Barber MRN: [id number] DOB: 1955/07/28   Care Coordination Outreach Attempts:  An unsuccessful telephone outreach was attempted today to offer the patient information about available care coordination services as a benefit of their health plan.   Follow Up Plan:  Additional outreach attempts will be made to offer the patient care coordination information and services.   Encounter Outcome:  No Answer   Care Coordination Interventions:  No, not indicated    Valente David, RN, MSN, [facility] Care Management Care Management Coordinator [phone]

## 2022-07-11 ENCOUNTER — Telehealth: Payer: Self-pay | Admitting: *Deleted

## 2022-07-11 ENCOUNTER — Encounter: Payer: Self-pay | Admitting: *Deleted

## 2022-07-11 NOTE — Patient Instructions (Signed)
Visit Information  Thank you for taking time to visit with me today. Please don't hesitate to contact me if I can be of assistance to you before our next scheduled telephone appointment.  Following are the goals we discussed today:  Call this RNCM with questions  Please call the Suicide and Crisis Lifeline: 988 call the [place] National Suicide Prevention Lifeline: 669-429-4975 or TTY: 531-712-3081 TTY 769 388 3155) to talk to a trained counselor call 1-800-273-TALK (toll free, 24 hour hotline) call 911 if you are experiencing a Mental Health or [place] or need someone to talk to.  Patient verbalizes understanding of instructions and care plan provided today and agrees to view in [place]. Active MyChart status and patient understanding of how to access instructions and care plan via MyChart confirmed with patient.     The patient has been provided with contact information for the care management team and has been advised to call with any health related questions or concerns.   [place] Management Care Management Coordinator [phone]

## 2022-07-11 NOTE — Patient Outreach (Signed)
  Care Coordination   Initial Visit Note   07/11/2022 Name: Brenda Barber MRN: [id number] DOB: [birth year]  Brenda Barber is a 67 y.o. year old female who sees [name], MD for primary care. I spoke with  Brenda Barber by phone today.  What matters to the patients health and wellness today?  Report she is doing well, denies any concerns at this time.  Denies any urgent concerns, encouraged to contact this care manager with questions.      Goals Addressed             This Visit's Progress    COMPLETED: Care Coordination Activities - No follow up needed       Care Coordination Interventions: Evaluation of current treatment plan related to overall health maintenance and patient's adherence to plan as established by provider Reviewed medications with patient and discussed affordability and adherence Provided patient with printed educational materials related to health maintenance Reviewed scheduled/upcoming provider appointments including Pulmonary on 3/20, Endocrine on 3/21, neuro on 3/25, and PCP on 3/26 Discussed plans with patient for ongoing care management follow up and provided patient with direct contact information for care management team Screening for signs and symptoms of depression related to chronic disease state  Assessed social determinant of health barriers          SDOH assessments and interventions completed:  Yes  SDOH Interventions Today    Flowsheet Row Most Recent Value  SDOH Interventions   Food Insecurity Interventions Intervention Not Indicated  Housing Interventions Intervention Not Indicated  Transportation Interventions Intervention Not Indicated        Care Coordination Interventions:  Yes, provided   Follow up plan: No further intervention required.   Encounter Outcome:  Pt. Visit Completed    Valente David, RN, MSN, [place] Care Management Care Management Coordinator [phone]

## 2022-08-02 DIAGNOSIS — I480 Paroxysmal atrial fibrillation: Secondary | ICD-10-CM | POA: Diagnosis not present

## 2022-08-02 DIAGNOSIS — G935 Compression of brain: Secondary | ICD-10-CM | POA: Diagnosis not present

## 2022-08-02 DIAGNOSIS — S80212A Abrasion, left knee, initial encounter: Secondary | ICD-10-CM | POA: Diagnosis not present

## 2022-08-02 DIAGNOSIS — G4733 Obstructive sleep apnea (adult) (pediatric): Secondary | ICD-10-CM | POA: Diagnosis not present

## 2022-08-02 DIAGNOSIS — Z85850 Personal history of malignant neoplasm of thyroid: Secondary | ICD-10-CM | POA: Diagnosis not present

## 2022-08-02 DIAGNOSIS — D649 Anemia, unspecified: Secondary | ICD-10-CM | POA: Diagnosis not present

## 2022-08-02 DIAGNOSIS — E119 Type 2 diabetes mellitus without complications: Secondary | ICD-10-CM | POA: Diagnosis not present

## 2022-08-02 DIAGNOSIS — Z9181 History of falling: Secondary | ICD-10-CM | POA: Diagnosis not present

## 2022-08-02 DIAGNOSIS — R918 Other nonspecific abnormal finding of lung field: Secondary | ICD-10-CM | POA: Diagnosis not present

## 2022-08-02 DIAGNOSIS — E892 Postprocedural hypoparathyroidism: Secondary | ICD-10-CM | POA: Diagnosis not present

## 2022-08-02 DIAGNOSIS — E890 Postprocedural hypothyroidism: Secondary | ICD-10-CM | POA: Diagnosis not present

## 2022-08-06 ENCOUNTER — Other Ambulatory Visit: Payer: Self-pay | Admitting: *Deleted

## 2022-08-06 DIAGNOSIS — C3432 Malignant neoplasm of lower lobe, left bronchus or lung: Secondary | ICD-10-CM

## 2022-08-08 DIAGNOSIS — J4520 Mild intermittent asthma, uncomplicated: Secondary | ICD-10-CM | POA: Diagnosis not present

## 2022-08-08 DIAGNOSIS — C3402 Malignant neoplasm of left main bronchus: Secondary | ICD-10-CM | POA: Diagnosis not present

## 2022-08-08 DIAGNOSIS — G4733 Obstructive sleep apnea (adult) (pediatric): Secondary | ICD-10-CM | POA: Diagnosis not present

## 2022-08-08 DIAGNOSIS — J301 Allergic rhinitis due to pollen: Secondary | ICD-10-CM | POA: Diagnosis not present

## 2022-08-09 DIAGNOSIS — N1832 Chronic kidney disease, stage 3b: Secondary | ICD-10-CM | POA: Diagnosis not present

## 2022-08-09 DIAGNOSIS — E892 Postprocedural hypoparathyroidism: Secondary | ICD-10-CM | POA: Diagnosis not present

## 2022-08-09 DIAGNOSIS — E890 Postprocedural hypothyroidism: Secondary | ICD-10-CM | POA: Diagnosis not present

## 2022-08-09 DIAGNOSIS — M8580 Other specified disorders of bone density and structure, unspecified site: Secondary | ICD-10-CM | POA: Diagnosis not present

## 2022-08-09 DIAGNOSIS — Z85850 Personal history of malignant neoplasm of thyroid: Secondary | ICD-10-CM | POA: Diagnosis not present

## 2022-08-13 DIAGNOSIS — G479 Sleep disorder, unspecified: Secondary | ICD-10-CM | POA: Diagnosis not present

## 2022-08-13 DIAGNOSIS — H9190 Unspecified hearing loss, unspecified ear: Secondary | ICD-10-CM | POA: Diagnosis not present

## 2022-08-13 DIAGNOSIS — R413 Other amnesia: Secondary | ICD-10-CM | POA: Diagnosis not present

## 2022-08-14 DIAGNOSIS — N1832 Chronic kidney disease, stage 3b: Secondary | ICD-10-CM | POA: Diagnosis not present

## 2022-08-14 DIAGNOSIS — Z0000 Encounter for general adult medical examination without abnormal findings: Secondary | ICD-10-CM | POA: Diagnosis not present

## 2022-08-14 DIAGNOSIS — C3432 Malignant neoplasm of lower lobe, left bronchus or lung: Secondary | ICD-10-CM | POA: Diagnosis not present

## 2022-08-14 DIAGNOSIS — Z1331 Encounter for screening for depression: Secondary | ICD-10-CM | POA: Diagnosis not present

## 2022-08-14 DIAGNOSIS — I700 Atherosclerosis of aorta: Secondary | ICD-10-CM | POA: Diagnosis not present

## 2022-08-14 DIAGNOSIS — E890 Postprocedural hypothyroidism: Secondary | ICD-10-CM | POA: Diagnosis not present

## 2022-08-14 DIAGNOSIS — G935 Compression of brain: Secondary | ICD-10-CM | POA: Diagnosis not present

## 2022-08-14 DIAGNOSIS — I480 Paroxysmal atrial fibrillation: Secondary | ICD-10-CM | POA: Diagnosis not present

## 2022-08-14 DIAGNOSIS — M25562 Pain in left knee: Secondary | ICD-10-CM | POA: Diagnosis not present

## 2022-08-17 ENCOUNTER — Ambulatory Visit
Admission: RE | Admit: 2022-08-17 | Discharge: 2022-08-17 | Disposition: A | Discharge status: Home / Self Care | Payer: Medicare HMO | Source: Ambulatory Visit | Attending: Radiation Oncology | Admitting: Radiation Oncology

## 2022-08-17 DIAGNOSIS — R918 Other nonspecific abnormal finding of lung field: Secondary | ICD-10-CM | POA: Diagnosis not present

## 2022-08-17 DIAGNOSIS — C3432 Malignant neoplasm of lower lobe, left bronchus or lung: Secondary | ICD-10-CM | POA: Diagnosis not present

## 2022-08-20 ENCOUNTER — Other Ambulatory Visit: Payer: Self-pay | Admitting: Cardiovascular Disease

## 2022-08-20 ENCOUNTER — Ambulatory Visit
Admission: RE | Admit: 2022-08-20 | Discharge: 2022-08-20 | Disposition: A | Discharge status: Home / Self Care | Payer: Medicare HMO | Source: Ambulatory Visit | Attending: Radiation Oncology | Admitting: Radiation Oncology

## 2022-08-20 VITALS — Resp 18 | Ht 63.0 in | Wt 144.3 lb

## 2022-08-20 DIAGNOSIS — Z923 Personal history of irradiation: Secondary | ICD-10-CM | POA: Insufficient documentation

## 2022-08-20 DIAGNOSIS — R918 Other nonspecific abnormal finding of lung field: Secondary | ICD-10-CM | POA: Diagnosis not present

## 2022-08-20 DIAGNOSIS — J449 Chronic obstructive pulmonary disease, unspecified: Secondary | ICD-10-CM | POA: Diagnosis not present

## 2022-08-20 DIAGNOSIS — C3432 Malignant neoplasm of lower lobe, left bronchus or lung: Secondary | ICD-10-CM | POA: Diagnosis not present

## 2022-08-20 NOTE — Progress Notes (Signed)
Radiation Oncology Follow up Note  Name: Brenda Barber   Date:   08/20/2022 MRN:  [id number] DOB: 04-28-1956    This 67 y.o. female presents to the clinic today for 65-month follow-up status post SBRT for stage I non-small cell lung cancer of the left lower lobe.  REFERRING PROVIDER: [name], MD  HPI: Patient is a 67 year old female now out for months having completed SBRT to her left lower lobe for a stage I non-small cell lung cancer.  Seen today in routine follow-up she is doing well.  She specifically [place] cough hemoptysis chest tightness or any real change in her pulmonary status..  She had a recent CT scan of her chest showing unchanged left lower lobe pulmonary nodule with some groundglass and adjacent bandlike scarring consistent with developing radiation changes.  She has multiple other nonspecific solid and groundglass pulmonary nodules which are unchanged.  COMPLICATIONS OF TREATMENT: none  FOLLOW UP COMPLIANCE: keeps appointments   PHYSICAL EXAM:  Resp 18   Ht 5\' 3"  (1.6 m)   Wt 144 lb 4.8 oz (65.5 kg)   BMI 25.56 kg/m  Well-developed well-nourished patient in NAD. HEENT reveals PERLA, EOMI, discs not visualized.  Oral cavity is clear. No oral mucosal lesions are identified. Neck is clear without evidence of cervical or supraclavicular adenopathy. Lungs are clear to A&P. Cardiac examination is essentially unremarkable with regular rate and rhythm without murmur rub or thrill. Abdomen is benign with no organomegaly or masses noted. Motor sensory and DTR levels are equal and symmetric in the upper and lower extremities. Cranial nerves II through XII are grossly intact. Proprioception is intact. No peripheral adenopathy or edema is identified. No motor or sensory levels are noted. Crude visual fields are within normal range.  RADIOLOGY RESULTS: CT scans reviewed compatible with above-stated findings  PLAN: Present time patient is doing well stable changes in her chest from  prior SBRT 4 months prior.  I have asked to see her back in 6 months with a follow-up CT scan at that time.  Certainly we will keep an eye on some of her other pulmonary nodules.  Patient knows to call with any concerns.  I would like to take this opportunity to thank you for allowing me to participate in the care of your patient.Noreene Filbert, MD

## 2022-08-27 ENCOUNTER — Other Ambulatory Visit: Payer: Self-pay | Admitting: Internal Medicine

## 2022-08-27 DIAGNOSIS — Z1231 Encounter for screening mammogram for malignant neoplasm of breast: Secondary | ICD-10-CM

## 2022-09-10 ENCOUNTER — Ambulatory Visit (INDEPENDENT_AMBULATORY_CARE_PROVIDER_SITE_OTHER): Payer: Medicare HMO | Admitting: Cardiovascular Disease

## 2022-09-10 ENCOUNTER — Encounter: Payer: Self-pay | Admitting: Cardiovascular Disease

## 2022-09-10 VITALS — BP 112/68 | HR 96 | Ht 63.0 in | Wt 143.2 lb

## 2022-09-10 DIAGNOSIS — I10 Essential (primary) hypertension: Secondary | ICD-10-CM

## 2022-09-10 DIAGNOSIS — I480 Paroxysmal atrial fibrillation: Secondary | ICD-10-CM

## 2022-09-10 DIAGNOSIS — E782 Mixed hyperlipidemia: Secondary | ICD-10-CM

## 2022-09-10 DIAGNOSIS — I700 Atherosclerosis of aorta: Secondary | ICD-10-CM

## 2022-09-10 MED ORDER — MULTAQ 400 MG PO TABS: 400.0000 mg | ORAL_TABLET | Freq: Two times a day (BID) | ORAL | 1 refills | Status: DC

## 2022-09-10 NOTE — Assessment & Plan Note (Signed)
07/2022 LDL 95. Continue atorvastatin 80 mg daily.

## 2022-09-10 NOTE — Assessment & Plan Note (Addendum)
In sinus rhythm on auscultation. Multaq approved by insurance until end of the year.

## 2022-09-10 NOTE — Assessment & Plan Note (Signed)
Well controlled. Continue same medications. 

## 2022-09-10 NOTE — Progress Notes (Signed)
Cardiology Office Note   Date:  09/10/2022   ID:  Brenda Barber, [place] Brenda Barber, MRN [id number]  PCP:  Barbette Reichmann, MD  Cardiologist:  Adrian Blackwater, MD      History of Present Illness: Brenda Barber is a 67 y.o. female who presents for  Chief Complaint  Patient presents with   Follow-up    4 month follow up    Patient in office for routine cardiac exam. Denies chest pain, shortness of breath, edema, palpitations.     Past Medical History:  Diagnosis Date   Allergy    Anemia    Asthma    Atrial fibrillation    Cerebrovascular accident 2001   NO RESIDUAL EFFECTS   Chiari I malformation    COPD (chronic obstructive pulmonary disease)    Coronary artery disease    Depression    Dyspnea    Dysrhythmia    atrial fib   GERD (gastroesophageal reflux disease)    Heart murmur    Hyperlipidemia    Hypertension    Hypoparathyroidism    Hypothyroidism    Personality disorder, depressive    Pneumonia    PONV (postoperative nausea and vomiting)    difficulty breathing during endoscopy, colonoscopy performed prior w/out problems-NAUSEA ONLY   Pre-diabetes    Renal insufficiency    Sleep apnea    CPAP   Thyroid cancer 1999   Papillary   Thyroid disease    hypothyroid      Past Surgical History:  Procedure Laterality Date   CANNOT TOLERATE PAP / Virginal     CATARACT EXTRACTION, BILATERAL     ELECTROMAGNETIC NAVIGATION BROCHOSCOPY Left 10/24/2018   Procedure: ELECTROMAGNETIC NAVIGATION BRONCHOSCOPY LEFT;  Surgeon: Salena Saner, MD;  Location: [facility] ORS;  Service: Cardiopulmonary;  Laterality: Left;   LEFT HEART CATH AND CORONARY ANGIOGRAPHY Right 11/18/2017   Procedure: Left Heart Cath with possible coronary intervention;  Surgeon: Laurier Nancy, MD;  Location: [facility] INVASIVE CV LAB;  Service: Cardiovascular;  Laterality: Right;   MRI brain  12/1999   THYROIDECTOMY     TONSILLECTOMY     TOTAL HIP ARTHROPLASTY Right 06/26/2016   Procedure: TOTAL HIP  ARTHROPLASTY ANTERIOR APPROACH;  Surgeon: Kennedy Bucker, MD;  Location: [facility] ORS;  Service: Orthopedics;  Laterality: Right;   VIDEO BRONCHOSCOPY WITH ENDOBRONCHIAL NAVIGATION Left 04/01/2020   Procedure: VIDEO BRONCHOSCOPY WITH ENDOBRONCHIAL NAVIGATION;  Surgeon: Salena Saner, MD;  Location: [facility] OR[facility];  Service: Pulmonary;  Laterality: Left;   VIDEO BRONCHOSCOPY WITH ENDOBRONCHIAL ULTRASOUND N/A 01/12/2022   Procedure: VIDEO BRONCHOSCOPY WITH ENDOBRONCHIAL ULTRASOUND;  Surgeon: Vida Rigger, MD;  Location: [facility] ORS;  Service: Thoracic;  Laterality: N/A;     Current Outpatient Medications  Medication Sig Dispense Refill   acetaminophen (TYLENOL) 500 MG tablet Take 1,000 mg by mouth every 6 (six) hours as needed for mild pain.      apixaban (ELIQUIS) 5 MG TABS tablet Take 1 tablet (5 mg total) by mouth 2 (two) times daily. 180 tablet 0   atorvastatin (LIPITOR) 80 MG tablet Take 80 mg by mouth at bedtime.      azelastine (ASTELIN) 0.1 % nasal spray Place into both nostrils.     calcitRIOL (ROCALTROL) 0.25 MCG capsule Take by mouth.     Calcium Carb-Cholecalciferol (CALCIUM 600+D) 600-10 MG-MCG TABS Take 1 tablet by mouth in the morning and at bedtime. citrical     Calcium-Phosphorus-Vitamin D (CITRACAL +D3 PO) Take by mouth daily. 2 tablets  cyanocobalamin (VITAMIN B12) 1000 MCG tablet Take by mouth.     diltiazem (TIAZAC) 120 MG 24 hr capsule TAKE 1 CAPSULE BY MOUTH DAILY 90 capsule 0   fluticasone (FLONASE) 50 MCG/ACT nasal spray Place into the nose.     gemfibrozil (LOPID) 600 MG tablet Take 600 mg by mouth 2 (two) times daily.     hydrocortisone 2.5 % ointment Apply topically.     iron polysaccharides (NIFEREX) 150 MG capsule Take 150 mg by mouth daily.      isosorbide mononitrate (IMDUR) 30 MG 24 hr tablet TAKE 1 TABLET BY MOUTH EVERY DAY 90 tablet 0   ketoconazole (NIZORAL) 2 % shampoo Apply topically.     levalbuterol (XOPENEX) 0.63 MG/3ML nebulizer solution Take 0.63 mg by  nebulization every 6 (six) hours as needed for wheezing or shortness of breath.     levothyroxine (SYNTHROID) 112 MCG tablet Take 112 mcg by mouth daily.     montelukast (SINGULAIR) 10 MG tablet Take 10 mg by mouth at bedtime.     Multiple Vitamin (MULTIVITAMIN) tablet Take 1 tablet by mouth daily.     nitroGLYCERIN (NITROSTAT) 0.4 MG SL tablet Place 0.4 mg under the tongue every 5 (five) minutes as needed for chest pain.      potassium chloride (KLOR-CON) 10 MEQ tablet Take 10 mEq by mouth daily.     Propylene Glycol 0.6 % SOLN Place 1 drop into both eyes at bedtime.     sucralfate (CARAFATE) 1 g tablet Take 1 g by mouth 2 (two) times daily.     thiamine 100 MG tablet Take 100 mg by mouth daily.     vitamin C (ASCORBIC ACID) 500 MG tablet Take 500 mg by mouth daily.     donepezil (ARICEPT) 10 MG tablet Take 10 mg by mouth at bedtime.     MULTAQ 400 MG tablet Take 1 tablet (400 mg total) by mouth 2 (two) times daily. 180 tablet 1   No current facility-administered medications for this visit.    Allergies:   Aspirin-dipyridamole er and Rosuvastatin    Social History:   reports that she has never smoked. She has never used smokeless tobacco. She reports that she does not drink alcohol and does not use drugs.   Family History:  family history includes Breast cancer (age of onset: 60) in her sister; Diabetes in her father, paternal aunt, and paternal uncle.    ROS:     Review of Systems  Constitutional: Negative.   HENT: Negative.    Eyes: Negative.   Respiratory: Negative.    Cardiovascular: Negative.   Gastrointestinal: Negative.   Genitourinary: Negative.   Musculoskeletal: Negative.   Skin: Negative.   Neurological: Negative.   Endo/Heme/Allergies: Negative.   Psychiatric/Behavioral: Negative.    All other systems reviewed and are negative.   All other systems are reviewed and negative.   PHYSICAL EXAM: VS:  BP 112/68   Pulse 96   Ht 5\' 3"  (1.6 m)   Wt 143 lb 3.2 oz (65  kg)   SpO2 92%   BMI 25.37 kg/m  , BMI Body mass index is 25.37 kg/m. Last weight:  Wt Readings from Last 3 Encounters:  09/10/22 143 lb 3.2 oz (65 kg)  08/20/22 144 lb 4.8 oz (65.5 kg)  05/22/22 138 lb 4.8 oz (62.7 kg)     Physical Exam Constitutional:      Appearance: Normal appearance.  Cardiovascular:     Rate and Rhythm: Normal rate and  regular rhythm.     Heart sounds: Normal heart sounds.  Pulmonary:     Effort: Pulmonary effort is normal.     Breath sounds: Normal breath sounds.  Musculoskeletal:     Right lower leg: No edema.     Left lower leg: No edema.  Neurological:     Mental Status: She is alert.     EKG: none today  Recent Labs: 01/10/2022: Platelets 215 01/12/2022: BUN 21; Creatinine, Ser 1.40; Hemoglobin 9.5; Potassium 3.4; Sodium 146    Lipid Panel    Component Value Date/Time   CHOL 250 (H) 01/11/2012 0723   TRIG 123 01/11/2012 0723   HDL 42 01/11/2012 0723   CHOLHDL 7 12/23/2009 1040   VLDL 25 01/11/2012 0723   LDLCALC 183 (H) 01/11/2012 0723   LDLDIRECT 101.1 12/23/2009 1040      Other studies Reviewed: Patient: [id number] - Brenda Barber DOB:  [birth year]  Date:  06/05/2021 13:49 Provider: Adrian Blackwater MD Encounter: NUCLEAR STRESS TEST   Page 1 TESTS    [facility] [address] [place], [place] [address] [phone] STUDY:  Gated Stress / Rest Myocardial Perfusion Imaging Tomographic (SPECT) Including attenuation correction Wall Motion, Left Ventricular Ejection Fraction By Gated Technique.Persantine Stress Test. SEX: Female  WEIGHT: 143 lbs  HEIGHT: 63 in   ARMS UP: YES/NO                                                                                                                                                                                REFERRING PHYSICIAN: Dr.Suraiya Dickerson Welton Flakes  INDICATION FOR STUDY: CP                                                                                                                                                                                                                     TECHNIQUE:  Approximately 20 minutes following the intravenous administration of 9.8 mCi of Tc-62m Sestamibi after stress testing in a reclined supine position with arms above their head if able to do so, gated SPECT imaging of the heart was performed. After about a 2hr break, the patient was injected intravenously with 33.0 mCi of Tc-7m Sestamibi.  Approximately 45 minutes later in the same position as stress imaging SPECT rest imaging of the heart was performed.  STRESS BY:  Adrian Blackwater, MD PROTOCOL:  Persantine   DOSE ADMIN: 7.3 cc     ROUTE OF ADMINISTRATION: IV                                                                            MAX PRED HR: 155                     85%: 132               75%: 116                                                                                                                   RESTING BP: 154/86    RESTING HR: 64  PEAK BP: 110/64  PEAK HR: 82  EXERCISE DURATION:    4 min injection                                            REASON FOR TEST TERMINATION:    Protocol end                                                                                                                              SYMPTOMS:   None                                                                                                                                                                                                          EKG RESULTS: NSR. 78/min. No significant ST changes with persantine.                                                              IMAGE QUALITY: Good  PERFUSION/WALL MOTION FINDINGS: EF = 81%. Small mild reversible mid anteroseptal, mid inferior, and apex (17) wall defects, normal wall motion.                                                                          IMPRESSION:  Equivocal stress test with normal LVEF, consider CCTA.                                                                                                                                                                                                                                                                                        Adrian Blackwater, MD Stress Interpreting Physician / Nuclear Interpreting Physician        Adrian Blackwater MD  Electronically signed by: Adrian Blackwater     Date: 06/09/2021 09:59  Patient: [id number] - Brenda Barber DOB:  [birth year]  Date:  11/10/2020 10:30 Provider: Adrian Blackwater MD Encounter: ECHO   Page 2 REASON FOR VISIT  Visit for: Echocardiogram/Syncope and collapse  Sex:    female   wt=  152  lbs.  BP= 110/62  Height=   63 inches.   TESTS  Imaging: Echocardiogram:  An echocardiogram in (2-d) mode was performed and in Doppler mode with color flow velocity mapping was performed. The aortic valve cusps are abnormal 2 cm, flow velocity 1.9   m/s, and systolic calculated mean flow gradient 8  mmHg. Mitral valve diastolic peak flow velocity E 0.7 m/s and E/A ratio 0.7. Aortic root diameter 3.4   cm. The LVOT internal diameter 1.9   cm and flow velocity was abnormal 0.9  m/s. LV systolic dimension 2.9  cm, diastolic 4.4  cm, posterior wall thickness 1.2   cm, fractional shortening 33 %, and EF 66 %. IVS thickness 1.4 cm. LA dimension 3.2 cm  RIGHT atrium=  16   cm2. Aortic Valve is  Normal. Mitral Valve has Trace Regurgitation. Pulmonic Valve is Normal. Tricuspid Valve has Mild Regurgitation. Mitral Valve =  Ea= 6.0  DT= 236 msec. Tricuspid Valve =  TR jet V=   1.8   RAP=5  RVSP= 18    mmHg.     ASSESSMENT  Technically adequate study.  Ejection fraction-66  Left Ventricle- Normal size   Left Ventricle diastolic dysfunction grade-Grade 1 relaxation abnormality  Mild Left ventricular hypertrophy  Right Ventricle- Normal size and function  Normal right ventricular wall motion  Left Atrium-Normal size  Right Atrium-Normal size  Aortic valve-Mild calcification of aortic valve cusps with No stenosis, No regurgitation  Pulmonic Valve-No stenosis, no regurgitation  Mitral Valve-Trace regurgitation  Tricuspid Valve-Mild Regurgitation  No Pericardial effusion.   THERAPY   Referring physician: Laurier Nancy  Sonographer: Lenor Derrick.   Adrian Blackwater MD  Electronically signed by: Adrian Blackwater     Date: 11/10/2020 11:24  Patient: [id number] - Brenda Barber DOB:  [birth year] Barber  Date:  08/05/2017 11:00 Provider: Adrian Blackwater MD Encounter: ALL ANGIOGRAMS (CTA BRAIN, CAROTIDS, RENAL ARTERIES, PE)   Page 2 REASON FOR VISIT  Referred by Dr. Adrian Blackwater.   TESTS  Imaging: Computed Tomographic Angiography:  Cardiac multidetector CT was performed paying particular attention to the coronary arteries for the diagnosis of: CAD (I25.10). Diagnostic Drugs:  Administered iohexol (Omnipaque) through an antecubital vein and images from the examination were analyzed for the presence and extent of coronary artery disease, using 3D image processing software. 100 mL of non-ionic contrast (Omnipaque) was used.    ASSESSMENT   The left main artery was abnormal:1  The proximal LAD artery are abnormal:2  The mid-LAD artery was abnormal:1  The distal LAD artery was abnormal:1  The proximal circumflex artery was abnormal:1  The distal circumflex artery  was abnormal:1  The first obtuse marginal branch artery (OM-1) was abnormal:1  The proximal right coronary artery (RCA) was abnormal:1  The mid right coronary artery (RCA) was abnormal:1  The distal right coronary artery (RCA) was abnormal:1  The posterior descending coronary arteries were abnormal:1     Postero-Lateral Branch:1     TEST CONCLUSIONS  Quality of study: Excellent  1-Calcium score: 196.2  2-Right dominant system  3-Mild proximal LAD disease. LCX and RCA are normal. Treat medically.  Adrian Blackwater MD  Electronically signed by: Adrian Blackwater     Date: 08/05/2017 15:28   ASSESSMENT AND PLAN:    ICD-10-CM   1. Benign essential hypertension  I10     2. Paroxysmal A-fib  I48.0     3. Aortic atherosclerosis  I70.0     4. Mixed hyperlipidemia  E78.2        Problem List Items Addressed This Visit       Cardiovascular and Mediastinum   Benign essential hypertension - Primary    Well controlled. Continue same medications.       Relevant Medications   MULTAQ 400 MG tablet   Paroxysmal A-fib    In sinus rhythm on auscultation. Multaq approved by insurance until end of the year.       Relevant Medications   MULTAQ 400 MG tablet   Aortic atherosclerosis   Relevant Medications   MULTAQ 400 MG tablet     Other   Hyperlipidemia    07/2022 LDL 95. Continue atorvastatin 80 mg daily.       Relevant Medications   MULTAQ 400 MG tablet    Disposition:   Return in about  4 months (around 01/10/2023).    Total time spent: 30 minutes  Signed,  Adrian Blackwater, MD  09/10/2022 1:13 PM    [facility]

## 2022-09-14 ENCOUNTER — Ambulatory Visit
Admission: RE | Admit: 2022-09-14 | Discharge: 2022-09-14 | Disposition: A | Discharge status: Home / Self Care | Payer: Medicare HMO | Source: Ambulatory Visit | Attending: Internal Medicine | Admitting: Internal Medicine

## 2022-09-14 DIAGNOSIS — Z1231 Encounter for screening mammogram for malignant neoplasm of breast: Secondary | ICD-10-CM | POA: Insufficient documentation

## 2022-10-16 ENCOUNTER — Other Ambulatory Visit: Payer: Self-pay | Admitting: Cardiovascular Disease

## 2022-10-16 DIAGNOSIS — T7840XD Allergy, unspecified, subsequent encounter: Secondary | ICD-10-CM | POA: Diagnosis not present

## 2022-10-16 DIAGNOSIS — J45909 Unspecified asthma, uncomplicated: Secondary | ICD-10-CM | POA: Diagnosis not present

## 2022-10-16 DIAGNOSIS — H9193 Unspecified hearing loss, bilateral: Secondary | ICD-10-CM | POA: Diagnosis not present

## 2022-10-16 DIAGNOSIS — H6691 Otitis media, unspecified, right ear: Secondary | ICD-10-CM | POA: Diagnosis not present

## 2022-10-22 DIAGNOSIS — N1832 Chronic kidney disease, stage 3b: Secondary | ICD-10-CM | POA: Diagnosis not present

## 2022-10-22 DIAGNOSIS — R809 Proteinuria, unspecified: Secondary | ICD-10-CM | POA: Diagnosis not present

## 2022-10-22 DIAGNOSIS — I10 Essential (primary) hypertension: Secondary | ICD-10-CM | POA: Diagnosis not present

## 2022-10-22 DIAGNOSIS — G4733 Obstructive sleep apnea (adult) (pediatric): Secondary | ICD-10-CM | POA: Diagnosis not present

## 2022-10-22 DIAGNOSIS — E785 Hyperlipidemia, unspecified: Secondary | ICD-10-CM | POA: Diagnosis not present

## 2022-10-22 DIAGNOSIS — I639 Cerebral infarction, unspecified: Secondary | ICD-10-CM | POA: Diagnosis not present

## 2022-10-22 DIAGNOSIS — D631 Anemia in chronic kidney disease: Secondary | ICD-10-CM | POA: Diagnosis not present

## 2022-10-31 ENCOUNTER — Other Ambulatory Visit: Payer: Self-pay | Admitting: Cardiovascular Disease

## 2022-11-01 DIAGNOSIS — I10 Essential (primary) hypertension: Secondary | ICD-10-CM | POA: Diagnosis not present

## 2022-11-01 DIAGNOSIS — R809 Proteinuria, unspecified: Secondary | ICD-10-CM | POA: Diagnosis not present

## 2022-11-01 DIAGNOSIS — G4733 Obstructive sleep apnea (adult) (pediatric): Secondary | ICD-10-CM | POA: Diagnosis not present

## 2022-11-01 DIAGNOSIS — D631 Anemia in chronic kidney disease: Secondary | ICD-10-CM | POA: Diagnosis not present

## 2022-11-01 DIAGNOSIS — I639 Cerebral infarction, unspecified: Secondary | ICD-10-CM | POA: Diagnosis not present

## 2022-11-01 DIAGNOSIS — E785 Hyperlipidemia, unspecified: Secondary | ICD-10-CM | POA: Diagnosis not present

## 2022-11-01 DIAGNOSIS — N1832 Chronic kidney disease, stage 3b: Secondary | ICD-10-CM | POA: Diagnosis not present

## 2022-11-12 DIAGNOSIS — H35342 Macular cyst, hole, or pseudohole, left eye: Secondary | ICD-10-CM | POA: Diagnosis not present

## 2022-11-12 DIAGNOSIS — H35371 Puckering of macula, right eye: Secondary | ICD-10-CM | POA: Diagnosis not present

## 2022-11-12 DIAGNOSIS — Z0100 Encounter for examination of eyes and vision without abnormal findings: Secondary | ICD-10-CM | POA: Diagnosis not present

## 2022-11-12 DIAGNOSIS — H43813 Vitreous degeneration, bilateral: Secondary | ICD-10-CM | POA: Diagnosis not present

## 2022-11-12 DIAGNOSIS — Z961 Presence of intraocular lens: Secondary | ICD-10-CM | POA: Diagnosis not present

## 2022-12-04 DIAGNOSIS — J301 Allergic rhinitis due to pollen: Secondary | ICD-10-CM | POA: Diagnosis not present

## 2022-12-04 DIAGNOSIS — R053 Chronic cough: Secondary | ICD-10-CM | POA: Diagnosis not present

## 2022-12-04 DIAGNOSIS — G4733 Obstructive sleep apnea (adult) (pediatric): Secondary | ICD-10-CM | POA: Diagnosis not present

## 2022-12-04 DIAGNOSIS — R911 Solitary pulmonary nodule: Secondary | ICD-10-CM | POA: Diagnosis not present

## 2022-12-04 DIAGNOSIS — J4520 Mild intermittent asthma, uncomplicated: Secondary | ICD-10-CM | POA: Diagnosis not present

## 2022-12-07 DIAGNOSIS — I10 Essential (primary) hypertension: Secondary | ICD-10-CM | POA: Diagnosis not present

## 2022-12-07 DIAGNOSIS — I700 Atherosclerosis of aorta: Secondary | ICD-10-CM | POA: Diagnosis not present

## 2022-12-07 DIAGNOSIS — M25562 Pain in left knee: Secondary | ICD-10-CM | POA: Diagnosis not present

## 2022-12-07 DIAGNOSIS — R809 Proteinuria, unspecified: Secondary | ICD-10-CM | POA: Diagnosis not present

## 2022-12-07 DIAGNOSIS — C3432 Malignant neoplasm of lower lobe, left bronchus or lung: Secondary | ICD-10-CM | POA: Diagnosis not present

## 2022-12-07 DIAGNOSIS — G935 Compression of brain: Secondary | ICD-10-CM | POA: Diagnosis not present

## 2022-12-07 DIAGNOSIS — E890 Postprocedural hypothyroidism: Secondary | ICD-10-CM | POA: Diagnosis not present

## 2022-12-07 DIAGNOSIS — N1832 Chronic kidney disease, stage 3b: Secondary | ICD-10-CM | POA: Diagnosis not present

## 2022-12-07 DIAGNOSIS — I480 Paroxysmal atrial fibrillation: Secondary | ICD-10-CM | POA: Diagnosis not present

## 2022-12-14 DIAGNOSIS — D509 Iron deficiency anemia, unspecified: Secondary | ICD-10-CM | POA: Diagnosis not present

## 2022-12-14 DIAGNOSIS — C3432 Malignant neoplasm of lower lobe, left bronchus or lung: Secondary | ICD-10-CM | POA: Diagnosis not present

## 2022-12-14 DIAGNOSIS — M8589 Other specified disorders of bone density and structure, multiple sites: Secondary | ICD-10-CM | POA: Diagnosis not present

## 2022-12-14 DIAGNOSIS — G935 Compression of brain: Secondary | ICD-10-CM | POA: Diagnosis not present

## 2022-12-14 DIAGNOSIS — G4733 Obstructive sleep apnea (adult) (pediatric): Secondary | ICD-10-CM | POA: Diagnosis not present

## 2022-12-14 DIAGNOSIS — J45909 Unspecified asthma, uncomplicated: Secondary | ICD-10-CM | POA: Diagnosis not present

## 2022-12-14 DIAGNOSIS — N1831 Chronic kidney disease, stage 3a: Secondary | ICD-10-CM | POA: Diagnosis not present

## 2022-12-14 DIAGNOSIS — I480 Paroxysmal atrial fibrillation: Secondary | ICD-10-CM | POA: Diagnosis not present

## 2022-12-14 DIAGNOSIS — Z85850 Personal history of malignant neoplasm of thyroid: Secondary | ICD-10-CM | POA: Diagnosis not present

## 2023-01-10 ENCOUNTER — Encounter: Payer: Self-pay | Admitting: Cardiovascular Disease

## 2023-01-10 ENCOUNTER — Ambulatory Visit (INDEPENDENT_AMBULATORY_CARE_PROVIDER_SITE_OTHER): Payer: Medicare HMO | Admitting: Cardiovascular Disease

## 2023-01-10 VITALS — BP 141/89 | HR 90 | Ht 63.0 in | Wt 154.4 lb

## 2023-01-10 DIAGNOSIS — I700 Atherosclerosis of aorta: Secondary | ICD-10-CM | POA: Diagnosis not present

## 2023-01-10 DIAGNOSIS — E782 Mixed hyperlipidemia: Secondary | ICD-10-CM

## 2023-01-10 DIAGNOSIS — I2510 Atherosclerotic heart disease of native coronary artery without angina pectoris: Secondary | ICD-10-CM | POA: Diagnosis not present

## 2023-01-10 DIAGNOSIS — I480 Paroxysmal atrial fibrillation: Secondary | ICD-10-CM | POA: Diagnosis not present

## 2023-01-10 DIAGNOSIS — R0602 Shortness of breath: Secondary | ICD-10-CM

## 2023-01-10 DIAGNOSIS — I10 Essential (primary) hypertension: Secondary | ICD-10-CM | POA: Diagnosis not present

## 2023-01-10 NOTE — Progress Notes (Signed)
Cardiology Office Note   Date:  01/10/2023   ID:  Barber, Brenda October 23, 1955, MRN [id number]  PCP:  Barbette Reichmann, MD  Cardiologist:  Adrian Blackwater, MD      History of Present Illness: Brenda Barber is a 67 y.o. female who presents for  Chief Complaint  Patient presents with   Follow-up    Has DOE      Past Medical History:  Diagnosis Date   Allergy    Anemia    Asthma    Atrial fibrillation (HCC)    Cerebrovascular accident (HCC) 2001   NO RESIDUAL EFFECTS   Chiari I malformation (HCC)    COPD (chronic obstructive pulmonary disease) (HCC)    Coronary artery disease    Depression    Dyspnea    Dysrhythmia    atrial fib   GERD (gastroesophageal reflux disease)    Heart murmur    Hyperlipidemia    Hypertension    Hypoparathyroidism (HCC)    Hypothyroidism    Personality disorder, depressive    Pneumonia    PONV (postoperative nausea and vomiting)    difficulty breathing during endoscopy, colonoscopy performed prior w/out problems-NAUSEA ONLY   Pre-diabetes    Renal insufficiency    Sleep apnea    CPAP   Thyroid cancer (HCC) 1999   Papillary   Thyroid disease    hypothyroid      Past Surgical History:  Procedure Laterality Date   CANNOT TOLERATE PAP / Virginal     CATARACT EXTRACTION, BILATERAL     ELECTROMAGNETIC NAVIGATION BROCHOSCOPY Left 10/24/2018   Procedure: ELECTROMAGNETIC NAVIGATION BRONCHOSCOPY LEFT;  Surgeon: Salena Saner, MD;  Location: [facility];  Service: Cardiopulmonary;  Laterality: Left;   LEFT HEART CATH AND CORONARY ANGIOGRAPHY Right 11/18/2017   Procedure: Left Heart Cath with possible coronary intervention;  Surgeon: Laurier Nancy, MD;  Location: [facility] INVASIVE CV LAB;  Service: Cardiovascular;  Laterality: Right;   MRI brain  12/1999   THYROIDECTOMY     TONSILLECTOMY     TOTAL HIP ARTHROPLASTY Right 06/26/2016   Procedure: TOTAL HIP ARTHROPLASTY ANTERIOR APPROACH;  Surgeon: Kennedy Bucker, MD;  Location: [facility];   Service: Orthopedics;  Laterality: Right;   VIDEO BRONCHOSCOPY WITH ENDOBRONCHIAL NAVIGATION Left 04/01/2020   Procedure: VIDEO BRONCHOSCOPY WITH ENDOBRONCHIAL NAVIGATION;  Surgeon: Salena Saner, MD;  Location: [facility];  Service: Pulmonary;  Laterality: Left;   VIDEO BRONCHOSCOPY WITH ENDOBRONCHIAL ULTRASOUND N/A 01/12/2022   Procedure: VIDEO BRONCHOSCOPY WITH ENDOBRONCHIAL ULTRASOUND;  Surgeon: Vida Rigger, MD;  Location: [facility];  Service: Thoracic;  Laterality: N/A;     Current Outpatient Medications  Medication Sig Dispense Refill   acetaminophen (TYLENOL) 500 MG tablet Take 1,000 mg by mouth every 6 (six) hours as needed for mild pain.      atorvastatin (LIPITOR) 80 MG tablet Take 80 mg by mouth at bedtime.      azelastine (ASTELIN) 0.1 % nasal spray Place into both nostrils.     calcitRIOL (ROCALTROL) 0.25 MCG capsule Take by mouth.     Calcium Carb-Cholecalciferol (CALCIUM 600+D) 600-10 MG-MCG TABS Take 1 tablet by mouth in the morning and at bedtime. citrical     Calcium-Phosphorus-Vitamin D (CITRACAL +D3 PO) Take by mouth daily. 2 tablets     cyanocobalamin (VITAMIN B12) 1000 MCG tablet Take by mouth.     diltiazem (DILACOR XR) 120 MG 24 hr capsule TAKE 1 CAPSULE BY MOUTH EVERY DAY 90 capsule 0   donepezil (  ARICEPT) 10 MG tablet Take 10 mg by mouth at bedtime.     ELIQUIS 5 MG TABS tablet TAKE 1 TABLET BY MOUTH TWICE DAILY 180 tablet 0   fluticasone (FLONASE) 50 MCG/ACT nasal spray Place into the nose.     gemfibrozil (LOPID) 600 MG tablet TAKE 1 TABLET BY MOUTH TWICE DAILY 30 MINUTES BEFORE BREAKFAST AND DINNER 180 tablet 0   hydrocortisone 2.5 % ointment Apply topically.     iron polysaccharides (NIFEREX) 150 MG capsule Take 150 mg by mouth daily.      isosorbide mononitrate (IMDUR) 30 MG 24 hr tablet TAKE 1 TABLET BY MOUTH EVERY DAY 90 tablet 0   ketoconazole (NIZORAL) 2 % shampoo Apply topically.     levalbuterol (XOPENEX) 0.63 MG/3ML nebulizer solution Take 0.63 mg  by nebulization every 6 (six) hours as needed for wheezing or shortness of breath.     levothyroxine (SYNTHROID) 112 MCG tablet Take 112 mcg by mouth daily.     montelukast (SINGULAIR) 10 MG tablet Take 10 mg by mouth at bedtime.     MULTAQ 400 MG tablet Take 1 tablet (400 mg total) by mouth 2 (two) times daily. 180 tablet 1   Multiple Vitamin (MULTIVITAMIN) tablet Take 1 tablet by mouth daily.     nitroGLYCERIN (NITROSTAT) 0.4 MG SL tablet Place 0.4 mg under the tongue every 5 (five) minutes as needed for chest pain.      potassium chloride (KLOR-CON) 10 MEQ tablet Take 10 mEq by mouth daily.     Propylene Glycol 0.6 % SOLN Place 1 drop into both eyes at bedtime.     sucralfate (CARAFATE) 1 g tablet TAKE 1 TABLET BY MOUTH TWICE DAILY 1 HOUR BEFORE MEALS 180 tablet 0   thiamine 100 MG tablet Take 100 mg by mouth daily.     vitamin C (ASCORBIC ACID) 500 MG tablet Take 500 mg by mouth daily.     No current facility-administered medications for this visit.    Allergies:   Aspirin-dipyridamole er and Rosuvastatin    Social History:   reports that she has never smoked. She has never used smokeless tobacco. She reports that she does not drink alcohol and does not use drugs.   Family History:  family history includes Breast cancer (age of onset: 66) in her sister; Diabetes in her father, paternal aunt, and paternal uncle.    ROS:     ROS    All other systems are reviewed and negative.    PHYSICAL EXAM: VS:  BP (!) 141/89   Pulse 90   Ht 5\' 3"  (1.6 m)   Wt 154 lb 6.4 oz (70 kg)   SpO2 97%   BMI 27.35 kg/m  , BMI Body mass index is 27.35 kg/m. Last weight:  Wt Readings from Last 3 Encounters:  01/10/23 154 lb 6.4 oz (70 kg)  09/10/22 143 lb 3.2 oz (65 kg)  08/20/22 144 lb 4.8 oz (65.5 kg)     Physical Exam    EKG:   Recent Labs: 01/12/2022: BUN 21; Creatinine, Ser 1.40; Hemoglobin 9.5; Potassium 3.4; Sodium 146    Lipid Panel    Component Value Date/Time   CHOL 250  (H) 01/11/2012 0723   TRIG 123 01/11/2012 0723   HDL 42 01/11/2012 0723   CHOLHDL 7 12/23/2009 1040   VLDL 25 01/11/2012 0723   LDLCALC 183 (H) 01/11/2012 0723   LDLDIRECT 101.1 12/23/2009 1040      Other studies Reviewed: Additional studies/ records  that were reviewed today include:  Review of the above records demonstrates:      03/29/2020    4:35 PM  PAD Screen  Previous PAD dx? No  Previous surgical procedure? No  Pain with walking? No  Feet/toe relief with dangling? No  Painful, non-healing ulcers? No  Extremities discolored? No      ASSESSMENT AND PLAN:    ICD-10-CM   1. Benign essential hypertension  I10 PCV ECHOCARDIOGRAM COMPLETE    MYOCARDIAL PERFUSION IMAGING   repeat BP 120/70    2. Paroxysmal A-fib (HCC)  I48.0 PCV ECHOCARDIOGRAM COMPLETE    MYOCARDIAL PERFUSION IMAGING    3. Aortic atherosclerosis (HCC)  I70.0 PCV ECHOCARDIOGRAM COMPLETE    MYOCARDIAL PERFUSION IMAGING    4. Mixed hyperlipidemia  E78.2 PCV ECHOCARDIOGRAM COMPLETE    MYOCARDIAL PERFUSION IMAGING    5. SOB (shortness of breath)  R06.02 PCV ECHOCARDIOGRAM COMPLETE    MYOCARDIAL PERFUSION IMAGING   Will do echo, stress test    6. Coronary artery disease involving native coronary artery of native heart without angina pectoris  I25.10 PCV ECHOCARDIOGRAM COMPLETE    MYOCARDIAL PERFUSION IMAGING   as h/o CAD, will check for ischaemia, do stress test       Problem List Items Addressed This Visit       Cardiovascular and Mediastinum   Benign essential hypertension - Primary   Relevant Orders   PCV ECHOCARDIOGRAM COMPLETE   MYOCARDIAL PERFUSION IMAGING   Paroxysmal A-fib (HCC)   Relevant Orders   PCV ECHOCARDIOGRAM COMPLETE   MYOCARDIAL PERFUSION IMAGING   Aortic atherosclerosis (HCC)   Relevant Orders   PCV ECHOCARDIOGRAM COMPLETE   MYOCARDIAL PERFUSION IMAGING     Other   Hyperlipidemia   Relevant Orders   PCV ECHOCARDIOGRAM COMPLETE   MYOCARDIAL PERFUSION IMAGING    Other Visit Diagnoses     SOB (shortness of breath)       Will do echo, stress test   Relevant Orders   PCV ECHOCARDIOGRAM COMPLETE   MYOCARDIAL PERFUSION IMAGING   Coronary artery disease involving native coronary artery of native heart without angina pectoris       as h/o CAD, will check for ischaemia, do stress test   Relevant Orders   PCV ECHOCARDIOGRAM COMPLETE   MYOCARDIAL PERFUSION IMAGING          Disposition:   Return in about 4 weeks (around 02/07/2023) for stress test echo and f/u.    Total time spent: 35 minutes  Signed,  Adrian Blackwater, MD  01/10/2023 1:59 PM    [facility]

## 2023-01-22 ENCOUNTER — Ambulatory Visit (INDEPENDENT_AMBULATORY_CARE_PROVIDER_SITE_OTHER): Payer: Medicare HMO

## 2023-01-22 DIAGNOSIS — R0602 Shortness of breath: Secondary | ICD-10-CM

## 2023-01-22 DIAGNOSIS — I480 Paroxysmal atrial fibrillation: Secondary | ICD-10-CM | POA: Diagnosis not present

## 2023-01-22 DIAGNOSIS — E782 Mixed hyperlipidemia: Secondary | ICD-10-CM

## 2023-01-22 DIAGNOSIS — I10 Essential (primary) hypertension: Secondary | ICD-10-CM

## 2023-01-22 DIAGNOSIS — I2510 Atherosclerotic heart disease of native coronary artery without angina pectoris: Secondary | ICD-10-CM | POA: Diagnosis not present

## 2023-01-22 DIAGNOSIS — I700 Atherosclerosis of aorta: Secondary | ICD-10-CM

## 2023-01-22 MED ORDER — TECHNETIUM TC 99M SESTAMIBI GENERIC - CARDIOLITE
10.9000 | Freq: Once | INTRAVENOUS | Status: AC | PRN
  Administered 2023-01-22: 10.9 via INTRAVENOUS

## 2023-01-22 MED ORDER — TECHNETIUM TC 99M SESTAMIBI GENERIC - CARDIOLITE
32.3000 | Freq: Once | INTRAVENOUS | Status: AC | PRN
  Administered 2023-01-22: 32.3 via INTRAVENOUS

## 2023-01-25 ENCOUNTER — Ambulatory Visit (INDEPENDENT_AMBULATORY_CARE_PROVIDER_SITE_OTHER): Payer: Medicare HMO

## 2023-01-25 DIAGNOSIS — R0602 Shortness of breath: Secondary | ICD-10-CM

## 2023-01-25 DIAGNOSIS — I361 Nonrheumatic tricuspid (valve) insufficiency: Secondary | ICD-10-CM

## 2023-01-25 DIAGNOSIS — I351 Nonrheumatic aortic (valve) insufficiency: Secondary | ICD-10-CM | POA: Diagnosis not present

## 2023-01-25 DIAGNOSIS — I10 Essential (primary) hypertension: Secondary | ICD-10-CM

## 2023-01-25 DIAGNOSIS — I700 Atherosclerosis of aorta: Secondary | ICD-10-CM

## 2023-01-25 DIAGNOSIS — I2510 Atherosclerotic heart disease of native coronary artery without angina pectoris: Secondary | ICD-10-CM

## 2023-01-25 DIAGNOSIS — I480 Paroxysmal atrial fibrillation: Secondary | ICD-10-CM

## 2023-01-25 DIAGNOSIS — E782 Mixed hyperlipidemia: Secondary | ICD-10-CM

## 2023-01-29 ENCOUNTER — Ambulatory Visit (INDEPENDENT_AMBULATORY_CARE_PROVIDER_SITE_OTHER): Payer: Medicare HMO | Admitting: Cardiovascular Disease

## 2023-01-29 ENCOUNTER — Other Ambulatory Visit: Payer: Self-pay | Admitting: Cardiovascular Disease

## 2023-01-29 ENCOUNTER — Encounter: Payer: Self-pay | Admitting: Cardiovascular Disease

## 2023-01-29 VITALS — BP 150/81 | HR 80 | Ht 63.0 in | Wt 153.6 lb

## 2023-01-29 DIAGNOSIS — I480 Paroxysmal atrial fibrillation: Secondary | ICD-10-CM

## 2023-01-29 DIAGNOSIS — I351 Nonrheumatic aortic (valve) insufficiency: Secondary | ICD-10-CM | POA: Diagnosis not present

## 2023-01-29 DIAGNOSIS — I700 Atherosclerosis of aorta: Secondary | ICD-10-CM

## 2023-01-29 DIAGNOSIS — I340 Nonrheumatic mitral (valve) insufficiency: Secondary | ICD-10-CM | POA: Diagnosis not present

## 2023-01-29 DIAGNOSIS — N1832 Chronic kidney disease, stage 3b: Secondary | ICD-10-CM

## 2023-01-29 DIAGNOSIS — I5033 Acute on chronic diastolic (congestive) heart failure: Secondary | ICD-10-CM

## 2023-01-29 DIAGNOSIS — I2510 Atherosclerotic heart disease of native coronary artery without angina pectoris: Secondary | ICD-10-CM | POA: Diagnosis not present

## 2023-01-29 DIAGNOSIS — I10 Essential (primary) hypertension: Secondary | ICD-10-CM | POA: Diagnosis not present

## 2023-01-29 MED ORDER — DAPAGLIFLOZIN PROPANEDIOL 10 MG PO TABS
10.0000 mg | ORAL_TABLET | Freq: Every day | ORAL | 3 refills | Status: DC
Start: 2023-01-29 — End: 2023-05-27

## 2023-01-29 NOTE — Progress Notes (Signed)
Cardiology Office Note   Date:  01/29/2023   ID:  Brenda Barber, [name] 1955/09/18, MRN [id number]  PCP:  Barbette Reichmann, MD  Cardiologist:  Adrian Blackwater, MD      History of Present Illness: Brenda Barber is a 67 y.o. female who presents for No chief complaint on file.   Feeling good      Past Medical History:  Diagnosis Date   Allergy    Anemia    Asthma    Atrial fibrillation (HCC)    Cerebrovascular accident (HCC) 2001   NO RESIDUAL EFFECTS   Chiari I malformation (HCC)    COPD (chronic obstructive pulmonary disease) (HCC)    Coronary artery disease    Depression    Dyspnea    Dysrhythmia    atrial fib   GERD (gastroesophageal reflux disease)    Heart murmur    Hyperlipidemia    Hypertension    Hypoparathyroidism (HCC)    Hypothyroidism    Personality disorder, depressive    Pneumonia    PONV (postoperative nausea and vomiting)    difficulty breathing during endoscopy, colonoscopy performed prior w/out problems-NAUSEA ONLY   Pre-diabetes    Renal insufficiency    Sleep apnea    CPAP   Thyroid cancer (HCC) 1999   Papillary   Thyroid disease    hypothyroid      Past Surgical History:  Procedure Laterality Date   CANNOT TOLERATE PAP / Virginal     CATARACT EXTRACTION, BILATERAL     ELECTROMAGNETIC NAVIGATION BROCHOSCOPY Left 10/24/2018   Procedure: ELECTROMAGNETIC NAVIGATION BRONCHOSCOPY LEFT;  Surgeon: Salena Saner, MD;  Location: [facility];  Service: Cardiopulmonary;  Laterality: Left;   LEFT HEART CATH AND CORONARY ANGIOGRAPHY Right 11/18/2017   Procedure: Left Heart Cath with possible coronary intervention;  Surgeon: Laurier Nancy, MD;  Location: [facility] INVASIVE CV LAB;  Service: Cardiovascular;  Laterality: Right;   MRI brain  12/1999   THYROIDECTOMY     TONSILLECTOMY     TOTAL HIP ARTHROPLASTY Right 06/26/2016   Procedure: TOTAL HIP ARTHROPLASTY ANTERIOR APPROACH;  Surgeon: Kennedy Bucker, MD;  Location: [facility];  Service: Orthopedics;   Laterality: Right;   VIDEO BRONCHOSCOPY WITH ENDOBRONCHIAL NAVIGATION Left 04/01/2020   Procedure: VIDEO BRONCHOSCOPY WITH ENDOBRONCHIAL NAVIGATION;  Surgeon: Salena Saner, MD;  Location: [facility];  Service: Pulmonary;  Laterality: Left;   VIDEO BRONCHOSCOPY WITH ENDOBRONCHIAL ULTRASOUND N/A 01/12/2022   Procedure: VIDEO BRONCHOSCOPY WITH ENDOBRONCHIAL ULTRASOUND;  Surgeon: Vida Rigger, MD;  Location: [facility];  Service: Thoracic;  Laterality: N/A;     Current Outpatient Medications  Medication Sig Dispense Refill   dapagliflozin propanediol (FARXIGA) 10 MG TABS tablet Take 1 tablet (10 mg total) by mouth daily before breakfast. 30 tablet 3   acetaminophen (TYLENOL) 500 MG tablet Take 1,000 mg by mouth every 6 (six) hours as needed for mild pain.      atorvastatin (LIPITOR) 80 MG tablet Take 80 mg by mouth at bedtime.      azelastine (ASTELIN) 0.1 % nasal spray Place into both nostrils.     calcitRIOL (ROCALTROL) 0.25 MCG capsule Take by mouth.     Calcium Carb-Cholecalciferol (CALCIUM 600+D) 600-10 MG-MCG TABS Take 1 tablet by mouth in the morning and at bedtime. citrical     Calcium-Phosphorus-Vitamin D (CITRACAL +D3 PO) Take by mouth daily. 2 tablets     cyanocobalamin (VITAMIN B12) 1000 MCG tablet Take by mouth.     diltiazem (DILACOR XR) 120  MG 24 hr capsule TAKE 1 CAPSULE BY MOUTH EVERY DAY 90 capsule 0   donepezil (ARICEPT) 10 MG tablet Take 10 mg by mouth at bedtime.     ELIQUIS 5 MG TABS tablet TAKE 1 TABLET BY MOUTH TWICE DAILY 180 tablet 0   fluticasone (FLONASE) 50 MCG/ACT nasal spray Place into the nose.     gemfibrozil (LOPID) 600 MG tablet TAKE 1 TABLET BY MOUTH TWICE DAILY 30 MINUTES BEFORE BREAKFAST AND DINNER 180 tablet 0   hydrocortisone 2.5 % ointment Apply topically.     iron polysaccharides (NIFEREX) 150 MG capsule Take 150 mg by mouth daily.      isosorbide mononitrate (IMDUR) 30 MG 24 hr tablet TAKE 1 TABLET BY MOUTH EVERY DAY 90 tablet 0   ketoconazole  (NIZORAL) 2 % shampoo Apply topically.     levalbuterol (XOPENEX) 0.63 MG/3ML nebulizer solution Take 0.63 mg by nebulization every 6 (six) hours as needed for wheezing or shortness of breath.     levothyroxine (SYNTHROID) 112 MCG tablet Take 112 mcg by mouth daily.     montelukast (SINGULAIR) 10 MG tablet Take 10 mg by mouth at bedtime.     MULTAQ 400 MG tablet Take 1 tablet (400 mg total) by mouth 2 (two) times daily. 180 tablet 1   Multiple Vitamin (MULTIVITAMIN) tablet Take 1 tablet by mouth daily.     nitroGLYCERIN (NITROSTAT) 0.4 MG SL tablet Place 0.4 mg under the tongue every 5 (five) minutes as needed for chest pain.      potassium chloride (KLOR-CON) 10 MEQ tablet Take 10 mEq by mouth daily.     Propylene Glycol 0.6 % SOLN Place 1 drop into both eyes at bedtime.     sucralfate (CARAFATE) 1 g tablet TAKE 1 TABLET BY MOUTH TWICE DAILY 1 HOUR BEFORE MEALS 180 tablet 0   thiamine 100 MG tablet Take 100 mg by mouth daily.     vitamin C (ASCORBIC ACID) 500 MG tablet Take 500 mg by mouth daily.     No current facility-administered medications for this visit.    Allergies:   Aspirin-dipyridamole er and Rosuvastatin    Social History:   reports that she has never smoked. She has never used smokeless tobacco. She reports that she does not drink alcohol and does not use drugs.   Family History:  family history includes Breast cancer (age of onset: 69) in her sister; Diabetes in her father, paternal aunt, and paternal uncle.    ROS:     Review of Systems  Constitutional: Negative.   HENT: Negative.    Eyes: Negative.   Respiratory: Negative.    Gastrointestinal: Negative.   Genitourinary: Negative.   Musculoskeletal: Negative.   Skin: Negative.   Neurological: Negative.   Endo/Heme/Allergies: Negative.   Psychiatric/Behavioral: Negative.    All other systems reviewed and are negative.     All other systems are reviewed and negative.    PHYSICAL EXAM: VS:  BP (!) 150/81    Pulse 80   Ht 5\' 3"  (1.6 m)   Wt 153 lb 9.6 oz (69.7 kg)   SpO2 95%   BMI 27.21 kg/m  , BMI Body mass index is 27.21 kg/m. Last weight:  Wt Readings from Last 3 Encounters:  01/29/23 153 lb 9.6 oz (69.7 kg)  01/10/23 154 lb 6.4 oz (70 kg)  09/10/22 143 lb 3.2 oz (65 kg)     Physical Exam Constitutional:      Appearance: Normal appearance.  Cardiovascular:  Rate and Rhythm: Normal rate and regular rhythm.     Heart sounds: Normal heart sounds.  Pulmonary:     Effort: Pulmonary effort is normal.     Breath sounds: Normal breath sounds.  Musculoskeletal:     Right lower leg: No edema.     Left lower leg: No edema.  Neurological:     Mental Status: She is alert.       EKG:   Recent Labs: No results found for requested labs within last 365 days.    Lipid Panel    Component Value Date/Time   CHOL 250 (H) 01/11/2012 0723   TRIG 123 01/11/2012 0723   HDL 42 01/11/2012 0723   CHOLHDL 7 12/23/2009 1040   VLDL 25 01/11/2012 0723   LDLCALC 183 (H) 01/11/2012 0723   LDLDIRECT 101.1 12/23/2009 1040      Other studies Reviewed: Additional studies/ records that were reviewed today include:  Review of the above records demonstrates:      03/29/2020    4:35 PM  PAD Screen  Previous PAD dx? No  Previous surgical procedure? No  Pain with walking? No  Feet/toe relief with dangling? No  Painful, non-healing ulcers? No  Extremities discolored? No      ASSESSMENT AND PLAN:    ICD-10-CM   1. Paroxysmal A-fib (HCC)  I48.0 dapagliflozin propanediol (FARXIGA) 10 MG TABS tablet    2. Primary hypertension  I10 dapagliflozin propanediol (FARXIGA) 10 MG TABS tablet    3. Aortic atherosclerosis (HCC)  I70.0 dapagliflozin propanediol (FARXIGA) 10 MG TABS tablet    4. Stage 3b chronic kidney disease (HCC)  N18.32 dapagliflozin propanediol (FARXIGA) 10 MG TABS tablet    5. Coronary artery disease involving native coronary artery of native heart without angina pectoris   I25.10 dapagliflozin propanediol (FARXIGA) 10 MG TABS tablet   stable    6. Nonrheumatic mitral valve regurgitation  I34.0 dapagliflozin propanediol (FARXIGA) 10 MG TABS tablet   mild    7. Nonrheumatic aortic valve insufficiency  I35.1 dapagliflozin propanediol (FARXIGA) 10 MG TABS tablet   mild AR, LVEF 76%, but has grade 3 diastlic dysfunction, add farxiga    8. CHF (congestive heart failure), NYHA class I, acute on chronic, diastolic (HCC)  I50.33    Grade 3 diastolic normal LVEF, add farxiga       Problem List Items Addressed This Visit       Cardiovascular and Mediastinum   Paroxysmal A-fib (HCC) - Primary   Relevant Medications   dapagliflozin propanediol (FARXIGA) 10 MG TABS tablet   Aortic atherosclerosis (HCC)   Relevant Medications   dapagliflozin propanediol (FARXIGA) 10 MG TABS tablet   Hypertension   Relevant Medications   dapagliflozin propanediol (FARXIGA) 10 MG TABS tablet     Genitourinary   CKD (chronic kidney disease) stage 3, GFR 30-59 ml/min (HCC)   Relevant Medications   dapagliflozin propanediol (FARXIGA) 10 MG TABS tablet   Other Visit Diagnoses     Coronary artery disease involving native coronary artery of native heart without angina pectoris       stable   Relevant Medications   dapagliflozin propanediol (FARXIGA) 10 MG TABS tablet   Nonrheumatic mitral valve regurgitation       mild   Relevant Medications   dapagliflozin propanediol (FARXIGA) 10 MG TABS tablet   Nonrheumatic aortic valve insufficiency       mild AR, LVEF 76%, but has grade 3 diastlic dysfunction, add farxiga   Relevant Medications  dapagliflozin propanediol (FARXIGA) 10 MG TABS tablet   CHF (congestive heart failure), NYHA class I, acute on chronic, diastolic (HCC)       Grade 3 diastolic normal LVEF, add farxiga          Disposition:   Return in about 2 months (around 03/31/2023).    Total time spent: 30 minutes  Signed,  Adrian Blackwater, MD  01/29/2023 11:11  AM    [facility]

## 2023-02-04 DIAGNOSIS — E890 Postprocedural hypothyroidism: Secondary | ICD-10-CM | POA: Diagnosis not present

## 2023-02-11 DIAGNOSIS — Z85850 Personal history of malignant neoplasm of thyroid: Secondary | ICD-10-CM | POA: Diagnosis not present

## 2023-02-11 DIAGNOSIS — N1832 Chronic kidney disease, stage 3b: Secondary | ICD-10-CM | POA: Diagnosis not present

## 2023-02-11 DIAGNOSIS — J449 Chronic obstructive pulmonary disease, unspecified: Secondary | ICD-10-CM | POA: Diagnosis not present

## 2023-02-11 DIAGNOSIS — M8580 Other specified disorders of bone density and structure, unspecified site: Secondary | ICD-10-CM | POA: Diagnosis not present

## 2023-02-11 DIAGNOSIS — E890 Postprocedural hypothyroidism: Secondary | ICD-10-CM | POA: Diagnosis not present

## 2023-02-11 DIAGNOSIS — E892 Postprocedural hypoparathyroidism: Secondary | ICD-10-CM | POA: Diagnosis not present

## 2023-02-19 ENCOUNTER — Other Ambulatory Visit: Payer: Self-pay | Admitting: Radiation Oncology

## 2023-02-19 ENCOUNTER — Ambulatory Visit
Admission: RE | Admit: 2023-02-19 | Discharge: 2023-02-19 | Disposition: A | Discharge status: Home / Self Care | Payer: Medicare HMO | Source: Ambulatory Visit | Attending: Radiation Oncology | Admitting: Radiation Oncology

## 2023-02-19 DIAGNOSIS — C3432 Malignant neoplasm of lower lobe, left bronchus or lung: Secondary | ICD-10-CM | POA: Insufficient documentation

## 2023-02-19 DIAGNOSIS — I700 Atherosclerosis of aorta: Secondary | ICD-10-CM | POA: Diagnosis not present

## 2023-02-19 DIAGNOSIS — C3490 Malignant neoplasm of unspecified part of unspecified bronchus or lung: Secondary | ICD-10-CM | POA: Diagnosis not present

## 2023-02-21 ENCOUNTER — Ambulatory Visit: Payer: Medicare HMO | Admitting: Radiation Oncology

## 2023-02-27 ENCOUNTER — Ambulatory Visit
Admission: RE | Admit: 2023-02-27 | Discharge: 2023-02-27 | Disposition: A | Discharge status: Home / Self Care | Payer: Medicare HMO | Source: Ambulatory Visit | Attending: Radiation Oncology | Admitting: Radiation Oncology

## 2023-02-27 ENCOUNTER — Other Ambulatory Visit: Payer: Self-pay | Admitting: *Deleted

## 2023-02-27 ENCOUNTER — Encounter: Payer: Self-pay | Admitting: Radiation Oncology

## 2023-02-27 VITALS — BP 147/93 | HR 79 | Temp 98.0°F | Resp 20 | Wt 151.9 lb

## 2023-02-27 DIAGNOSIS — R918 Other nonspecific abnormal finding of lung field: Secondary | ICD-10-CM | POA: Diagnosis not present

## 2023-02-27 DIAGNOSIS — C3432 Malignant neoplasm of lower lobe, left bronchus or lung: Secondary | ICD-10-CM

## 2023-02-27 DIAGNOSIS — Z923 Personal history of irradiation: Secondary | ICD-10-CM | POA: Insufficient documentation

## 2023-02-27 NOTE — Progress Notes (Signed)
Radiation Oncology Follow up Note  Name: Brenda Barber   Date:   02/27/2023 MRN:  [id number] DOB: 01/04/1956    This 66 y.o. female presents to the clinic today for 30-month follow-up status post SBRT to her left lower lobe for stage I non-small cell lung cancer.  REFERRING PROVIDER: Barbette Reichmann, MD  HPI: Patient is a 67 year old female now out 10 months having completed SBRT to her left lower lobe for stage I non-small cell lung cancer.  Seen today in routine follow-up she is doing well specifically denies change in a pulmonary status cough hemoptysis chest tightness or dysphagia..  She had a recent CT scan which I have reviewed and has not been formally read.  There are multiple small gland groundglass lesions throughout her lungs.  These are have been present over time and appear unchanged at this time.  Area of the left lower lobe looks to have completely responded.  COMPLICATIONS OF TREATMENT: none  FOLLOW UP COMPLIANCE: keeps appointments   PHYSICAL EXAM:  BP (!) 147/93 Comment: Patient will discuss with PCP  Pulse 79   Temp 98 F (36.7 C) (Tympanic)   Resp 20   Wt 151 lb 14.4 oz (68.9 kg)   BMI 26.91 kg/m  Well-developed well-nourished patient in NAD. HEENT reveals PERLA, EOMI, discs not visualized.  Oral cavity is clear. No oral mucosal lesions are identified. Neck is clear without evidence of cervical or supraclavicular adenopathy. Lungs are clear to A&P. Cardiac examination is essentially unremarkable with regular rate and rhythm without murmur rub or thrill. Abdomen is benign with no organomegaly or masses noted. Motor sensory and DTR levels are equal and symmetric in the upper and lower extremities. Cranial nerves II through XII are grossly intact. Proprioception is intact. No peripheral adenopathy or edema is identified. No motor or sensory levels are noted. Crude visual fields are within normal range.  RADIOLOGY RESULTS: Serial CT scans reviewed compatible with  above-stated findings  PLAN: Present time we will rate the formal read of her CT scan of her chest open I believe her chest is stable with good response to the left lower lobe stage I non-small cell lung cancer.  I have asked to see her back in 6 months with a follow-up CT scan.  Patient knows to call sooner with any concerns.  I would like to take this opportunity to thank you for allowing me to participate in the care of your patient.Brenda Miller, MD

## 2023-03-06 DIAGNOSIS — R413 Other amnesia: Secondary | ICD-10-CM | POA: Diagnosis not present

## 2023-03-06 DIAGNOSIS — N1832 Chronic kidney disease, stage 3b: Secondary | ICD-10-CM | POA: Diagnosis not present

## 2023-03-06 DIAGNOSIS — R809 Proteinuria, unspecified: Secondary | ICD-10-CM | POA: Diagnosis not present

## 2023-03-06 DIAGNOSIS — I639 Cerebral infarction, unspecified: Secondary | ICD-10-CM | POA: Diagnosis not present

## 2023-03-06 DIAGNOSIS — G4733 Obstructive sleep apnea (adult) (pediatric): Secondary | ICD-10-CM | POA: Diagnosis not present

## 2023-03-06 DIAGNOSIS — E785 Hyperlipidemia, unspecified: Secondary | ICD-10-CM | POA: Diagnosis not present

## 2023-03-06 DIAGNOSIS — D631 Anemia in chronic kidney disease: Secondary | ICD-10-CM | POA: Diagnosis not present

## 2023-03-06 DIAGNOSIS — I10 Essential (primary) hypertension: Secondary | ICD-10-CM | POA: Diagnosis not present

## 2023-03-06 DIAGNOSIS — H9190 Unspecified hearing loss, unspecified ear: Secondary | ICD-10-CM | POA: Diagnosis not present

## 2023-03-06 DIAGNOSIS — R2689 Other abnormalities of gait and mobility: Secondary | ICD-10-CM | POA: Diagnosis not present

## 2023-03-06 DIAGNOSIS — G479 Sleep disorder, unspecified: Secondary | ICD-10-CM | POA: Diagnosis not present

## 2023-03-14 DIAGNOSIS — E785 Hyperlipidemia, unspecified: Secondary | ICD-10-CM | POA: Diagnosis not present

## 2023-03-14 DIAGNOSIS — D631 Anemia in chronic kidney disease: Secondary | ICD-10-CM | POA: Diagnosis not present

## 2023-03-14 DIAGNOSIS — I639 Cerebral infarction, unspecified: Secondary | ICD-10-CM | POA: Diagnosis not present

## 2023-03-14 DIAGNOSIS — G4733 Obstructive sleep apnea (adult) (pediatric): Secondary | ICD-10-CM | POA: Diagnosis not present

## 2023-03-14 DIAGNOSIS — I10 Essential (primary) hypertension: Secondary | ICD-10-CM | POA: Diagnosis not present

## 2023-03-14 DIAGNOSIS — N1832 Chronic kidney disease, stage 3b: Secondary | ICD-10-CM | POA: Diagnosis not present

## 2023-03-14 DIAGNOSIS — R809 Proteinuria, unspecified: Secondary | ICD-10-CM | POA: Diagnosis not present

## 2023-04-01 ENCOUNTER — Encounter: Payer: Self-pay | Admitting: Cardiovascular Disease

## 2023-04-01 ENCOUNTER — Ambulatory Visit (INDEPENDENT_AMBULATORY_CARE_PROVIDER_SITE_OTHER): Payer: Medicare HMO | Admitting: Cardiovascular Disease

## 2023-04-01 VITALS — BP 108/72 | HR 90 | Ht 63.0 in | Wt 151.6 lb

## 2023-04-01 DIAGNOSIS — I480 Paroxysmal atrial fibrillation: Secondary | ICD-10-CM | POA: Diagnosis not present

## 2023-04-01 DIAGNOSIS — I2510 Atherosclerotic heart disease of native coronary artery without angina pectoris: Secondary | ICD-10-CM | POA: Diagnosis not present

## 2023-04-01 DIAGNOSIS — I5033 Acute on chronic diastolic (congestive) heart failure: Secondary | ICD-10-CM

## 2023-04-01 DIAGNOSIS — G4733 Obstructive sleep apnea (adult) (pediatric): Secondary | ICD-10-CM | POA: Diagnosis not present

## 2023-04-01 DIAGNOSIS — I340 Nonrheumatic mitral (valve) insufficiency: Secondary | ICD-10-CM

## 2023-04-01 DIAGNOSIS — I351 Nonrheumatic aortic (valve) insufficiency: Secondary | ICD-10-CM

## 2023-04-01 DIAGNOSIS — I10 Essential (primary) hypertension: Secondary | ICD-10-CM

## 2023-04-01 NOTE — Progress Notes (Signed)
Cardiology Office Note   Date:  04/01/2023   ID:  Brenda Barber, [place] 14-Jul-1955, MRN [id number]  PCP:  Barbette Reichmann, MD  Cardiologist:  Adrian Blackwater, MD      History of Present Illness: Brenda Barber is a 67 y.o. female who presents for  Chief Complaint  Patient presents with   Follow-up    Follow up    Get SOB      Past Medical History:  Diagnosis Date   Allergy    Anemia    Asthma    Atrial fibrillation (HCC)    Cerebrovascular accident (HCC) 2001   NO RESIDUAL EFFECTS   Chiari I malformation (HCC)    COPD (chronic obstructive pulmonary disease) (HCC)    Coronary artery disease    Depression    Dyspnea    Dysrhythmia    atrial fib   GERD (gastroesophageal reflux disease)    Heart murmur    Hyperlipidemia    Hypertension    Hypoparathyroidism (HCC)    Hypothyroidism    Personality disorder, depressive    Pneumonia    PONV (postoperative nausea and vomiting)    difficulty breathing during endoscopy, colonoscopy performed prior w/out problems-NAUSEA ONLY   Pre-diabetes    Renal insufficiency    Sleep apnea    CPAP   Thyroid cancer (HCC) 1999   Papillary   Thyroid disease    hypothyroid      Past Surgical History:  Procedure Laterality Date   CANNOT TOLERATE PAP / Virginal     CATARACT EXTRACTION, BILATERAL     ELECTROMAGNETIC NAVIGATION BROCHOSCOPY Left 10/24/2018   Procedure: ELECTROMAGNETIC NAVIGATION BRONCHOSCOPY LEFT;  Surgeon: Salena Saner, MD;  Location: [facility];  Service: Cardiopulmonary;  Laterality: Left;   LEFT HEART CATH AND CORONARY ANGIOGRAPHY Right 11/18/2017   Procedure: Left Heart Cath with possible coronary intervention;  Surgeon: Laurier Nancy, MD;  Location: [facility] INVASIVE CV LAB;  Service: Cardiovascular;  Laterality: Right;   MRI brain  12/1999   THYROIDECTOMY     TONSILLECTOMY     TOTAL HIP ARTHROPLASTY Right 06/26/2016   Procedure: TOTAL HIP ARTHROPLASTY ANTERIOR APPROACH;  Surgeon: Kennedy Bucker, MD;  Location:  [facility];  Service: Orthopedics;  Laterality: Right;   VIDEO BRONCHOSCOPY WITH ENDOBRONCHIAL NAVIGATION Left 04/01/2020   Procedure: VIDEO BRONCHOSCOPY WITH ENDOBRONCHIAL NAVIGATION;  Surgeon: Salena Saner, MD;  Location: [facility];  Service: Pulmonary;  Laterality: Left;   VIDEO BRONCHOSCOPY WITH ENDOBRONCHIAL ULTRASOUND N/A 01/12/2022   Procedure: VIDEO BRONCHOSCOPY WITH ENDOBRONCHIAL ULTRASOUND;  Surgeon: Vida Rigger, MD;  Location: [facility];  Service: Thoracic;  Laterality: N/A;     Current Outpatient Medications  Medication Sig Dispense Refill   acetaminophen (TYLENOL) 500 MG tablet Take 1,000 mg by mouth every 6 (six) hours as needed for mild pain.      atorvastatin (LIPITOR) 80 MG tablet Take 80 mg by mouth at bedtime.      azelastine (ASTELIN) 0.1 % nasal spray Place into both nostrils.     calcitRIOL (ROCALTROL) 0.25 MCG capsule Take by mouth.     Calcium Carb-Cholecalciferol (CALCIUM 600+D) 600-10 MG-MCG TABS Take 1 tablet by mouth in the morning and at bedtime. citrical     Calcium-Phosphorus-Vitamin D (CITRACAL +D3 PO) Take by mouth daily. 2 tablets     cyanocobalamin (VITAMIN B12) 1000 MCG tablet Take by mouth.     dapagliflozin propanediol (FARXIGA) 10 MG TABS tablet Take 1 tablet (10 mg total) by mouth daily  before breakfast. 30 tablet 3   DILT-XR 120 MG 24 hr capsule TAKE 1 CAPSULE BY MOUTH EVERY DAY 90 capsule 0   ELIQUIS 5 MG TABS tablet TAKE 1 TABLET BY MOUTH TWICE DAILY 180 tablet 0   fluticasone (FLONASE) 50 MCG/ACT nasal spray Place into the nose.     gemfibrozil (LOPID) 600 MG tablet TAKE 1 TABLET BY MOUTH TWICE DAILY 30 MINUTES BEFORE BREAKFAST AND DINNER 180 tablet 0   hydrocortisone 2.5 % ointment Apply topically.     iron polysaccharides (NIFEREX) 150 MG capsule Take 150 mg by mouth daily.      isosorbide mononitrate (IMDUR) 30 MG 24 hr tablet TAKE 1 TABLET BY MOUTH EVERY DAY 90 tablet 0   ketoconazole (NIZORAL) 2 % shampoo Apply topically.      levalbuterol (XOPENEX) 0.63 MG/3ML nebulizer solution Take 0.63 mg by nebulization every 6 (six) hours as needed for wheezing or shortness of breath.     levothyroxine (SYNTHROID) 112 MCG tablet Take 112 mcg by mouth daily.     montelukast (SINGULAIR) 10 MG tablet Take 10 mg by mouth at bedtime.     MULTAQ 400 MG tablet TAKE 1 TABLET(400 MG) BY MOUTH TWICE DAILY 180 tablet 1   Multiple Vitamin (MULTIVITAMIN) tablet Take 1 tablet by mouth daily.     nitroGLYCERIN (NITROSTAT) 0.4 MG SL tablet Place 0.4 mg under the tongue every 5 (five) minutes as needed for chest pain.      potassium chloride (KLOR-CON) 10 MEQ tablet Take 10 mEq by mouth daily.     Propylene Glycol 0.6 % SOLN Place 1 drop into both eyes at bedtime.     sucralfate (CARAFATE) 1 g tablet TAKE 1 TABLET BY MOUTH TWICE DAILY 1 HOUR BEFORE MEALS 180 tablet 0   thiamine 100 MG tablet Take 100 mg by mouth daily.     vitamin C (ASCORBIC ACID) 500 MG tablet Take 500 mg by mouth daily.     donepezil (ARICEPT) 10 MG tablet Take 10 mg by mouth at bedtime.     No current facility-administered medications for this visit.    Allergies:   Aspirin-dipyridamole er and Rosuvastatin    Social History:   reports that she has never smoked. She has never used smokeless tobacco. She reports that she does not drink alcohol and does not use drugs.   Family History:  family history includes Breast cancer (age of onset: 59) in her sister; Diabetes in her father, paternal aunt, and paternal uncle.    ROS:     Review of Systems  Constitutional: Negative.   HENT: Negative.    Eyes: Negative.   Respiratory: Negative.    Gastrointestinal: Negative.   Genitourinary: Negative.   Musculoskeletal: Negative.   Skin: Negative.   Neurological: Negative.   Endo/Heme/Allergies: Negative.   Psychiatric/Behavioral: Negative.    All other systems reviewed and are negative.     All other systems are reviewed and negative.    PHYSICAL EXAM: VS:  BP  108/72   Pulse 90   Ht 5\' 3"  (1.6 m)   Wt 151 lb 9.6 oz (68.8 kg)   SpO2 94%   BMI 26.85 kg/m  , BMI Body mass index is 26.85 kg/m. Last weight:  Wt Readings from Last 3 Encounters:  04/01/23 151 lb 9.6 oz (68.8 kg)  02/27/23 151 lb 14.4 oz (68.9 kg)  01/29/23 153 lb 9.6 oz (69.7 kg)     Physical Exam Constitutional:      Appearance:  Normal appearance.  Cardiovascular:     Rate and Rhythm: Normal rate and regular rhythm.     Heart sounds: Normal heart sounds.  Pulmonary:     Effort: Pulmonary effort is normal.     Breath sounds: Normal breath sounds.  Musculoskeletal:     Right lower leg: No edema.     Left lower leg: No edema.  Neurological:     Mental Status: She is alert.       EKG:   Recent Labs: No results found for requested labs within last 365 days.    Lipid Panel    Component Value Date/Time   CHOL 250 (H) 01/11/2012 0723   TRIG 123 01/11/2012 0723   HDL 42 01/11/2012 0723   CHOLHDL 7 12/23/2009 1040   VLDL 25 01/11/2012 0723   LDLCALC 183 (H) 01/11/2012 0723   LDLDIRECT 101.1 12/23/2009 1040      Other studies Reviewed: Additional studies/ records that were reviewed today include:  Review of the above records demonstrates:      03/29/2020    4:35 PM  PAD Screen  Previous PAD dx? No  Previous surgical procedure? No  Pain with walking? No  Feet/toe relief with dangling? No  Painful, non-healing ulcers? No  Extremities discolored? No      ASSESSMENT AND PLAN:    ICD-10-CM   1. Paroxysmal A-fib (HCC)  I48.0    Remains in NSR    2. Primary hypertension  I10     3. Sleep apnea, obstructive  G47.33     4. CHF (congestive heart failure), NYHA class I, acute on chronic, diastolic (HCC)  I50.33    compensated    5. Nonrheumatic aortic valve insufficiency  I35.1     6. Nonrheumatic mitral valve regurgitation  I34.0     7. Coronary artery disease involving native coronary artery of native heart without angina pectoris  I25.10         Problem List Items Addressed This Visit       Cardiovascular and Mediastinum   Paroxysmal A-fib (HCC) - Primary   Hypertension     Respiratory   Sleep apnea, obstructive   Other Visit Diagnoses     CHF (congestive heart failure), NYHA class I, acute on chronic, diastolic (HCC)       compensated   Nonrheumatic aortic valve insufficiency       Nonrheumatic mitral valve regurgitation       Coronary artery disease involving native coronary artery of native heart without angina pectoris              Disposition:   Return in about 4 months (around 07/30/2023).    Total time spent: 30 minutes  Signed,  Adrian Blackwater, MD  04/01/2023 1:33 PM    [facility]

## 2023-04-03 DIAGNOSIS — G4733 Obstructive sleep apnea (adult) (pediatric): Secondary | ICD-10-CM | POA: Diagnosis not present

## 2023-04-03 DIAGNOSIS — J4520 Mild intermittent asthma, uncomplicated: Secondary | ICD-10-CM | POA: Diagnosis not present

## 2023-04-03 DIAGNOSIS — R918 Other nonspecific abnormal finding of lung field: Secondary | ICD-10-CM | POA: Diagnosis not present

## 2023-04-03 DIAGNOSIS — R911 Solitary pulmonary nodule: Secondary | ICD-10-CM | POA: Diagnosis not present

## 2023-04-04 ENCOUNTER — Other Ambulatory Visit: Payer: Self-pay | Admitting: Cardiovascular Disease

## 2023-04-27 ENCOUNTER — Other Ambulatory Visit: Payer: Self-pay | Admitting: Cardiovascular Disease

## 2023-04-29 ENCOUNTER — Other Ambulatory Visit: Payer: Self-pay | Admitting: Cardiovascular Disease

## 2023-05-10 DIAGNOSIS — D649 Anemia, unspecified: Secondary | ICD-10-CM | POA: Diagnosis not present

## 2023-05-10 DIAGNOSIS — C3432 Malignant neoplasm of lower lobe, left bronchus or lung: Secondary | ICD-10-CM | POA: Diagnosis not present

## 2023-05-10 DIAGNOSIS — G935 Compression of brain: Secondary | ICD-10-CM | POA: Diagnosis not present

## 2023-05-10 DIAGNOSIS — Z85850 Personal history of malignant neoplasm of thyroid: Secondary | ICD-10-CM | POA: Diagnosis not present

## 2023-05-10 DIAGNOSIS — I10 Essential (primary) hypertension: Secondary | ICD-10-CM | POA: Diagnosis not present

## 2023-05-10 DIAGNOSIS — R7303 Prediabetes: Secondary | ICD-10-CM | POA: Diagnosis not present

## 2023-05-10 DIAGNOSIS — N1832 Chronic kidney disease, stage 3b: Secondary | ICD-10-CM | POA: Diagnosis not present

## 2023-05-10 DIAGNOSIS — G4733 Obstructive sleep apnea (adult) (pediatric): Secondary | ICD-10-CM | POA: Diagnosis not present

## 2023-05-10 DIAGNOSIS — I480 Paroxysmal atrial fibrillation: Secondary | ICD-10-CM | POA: Diagnosis not present

## 2023-05-21 DIAGNOSIS — C3432 Malignant neoplasm of lower lobe, left bronchus or lung: Secondary | ICD-10-CM | POA: Diagnosis not present

## 2023-05-21 DIAGNOSIS — J45909 Unspecified asthma, uncomplicated: Secondary | ICD-10-CM | POA: Diagnosis not present

## 2023-05-21 DIAGNOSIS — N611 Abscess of the breast and nipple: Secondary | ICD-10-CM | POA: Diagnosis not present

## 2023-05-21 DIAGNOSIS — Z0000 Encounter for general adult medical examination without abnormal findings: Secondary | ICD-10-CM | POA: Diagnosis not present

## 2023-05-21 DIAGNOSIS — I10 Essential (primary) hypertension: Secondary | ICD-10-CM | POA: Diagnosis not present

## 2023-05-21 DIAGNOSIS — G4733 Obstructive sleep apnea (adult) (pediatric): Secondary | ICD-10-CM | POA: Diagnosis not present

## 2023-05-21 DIAGNOSIS — I480 Paroxysmal atrial fibrillation: Secondary | ICD-10-CM | POA: Diagnosis not present

## 2023-05-21 DIAGNOSIS — E890 Postprocedural hypothyroidism: Secondary | ICD-10-CM | POA: Diagnosis not present

## 2023-05-21 DIAGNOSIS — N1831 Chronic kidney disease, stage 3a: Secondary | ICD-10-CM | POA: Diagnosis not present

## 2023-05-22 DIAGNOSIS — N611 Abscess of the breast and nipple: Secondary | ICD-10-CM

## 2023-05-22 HISTORY — DX: Abscess of the breast and nipple: N61.1

## 2023-05-27 ENCOUNTER — Ambulatory Visit: Payer: Self-pay | Admitting: Surgery

## 2023-05-27 ENCOUNTER — Other Ambulatory Visit: Payer: Self-pay | Admitting: Cardiovascular Disease

## 2023-05-27 DIAGNOSIS — I10 Essential (primary) hypertension: Secondary | ICD-10-CM

## 2023-05-27 DIAGNOSIS — I480 Paroxysmal atrial fibrillation: Secondary | ICD-10-CM

## 2023-05-27 DIAGNOSIS — N1832 Chronic kidney disease, stage 3b: Secondary | ICD-10-CM

## 2023-05-27 DIAGNOSIS — I340 Nonrheumatic mitral (valve) insufficiency: Secondary | ICD-10-CM

## 2023-05-27 DIAGNOSIS — I2510 Atherosclerotic heart disease of native coronary artery without angina pectoris: Secondary | ICD-10-CM

## 2023-05-27 DIAGNOSIS — I351 Nonrheumatic aortic (valve) insufficiency: Secondary | ICD-10-CM

## 2023-05-27 DIAGNOSIS — I700 Atherosclerosis of aorta: Secondary | ICD-10-CM

## 2023-05-27 DIAGNOSIS — S21001A Unspecified open wound of right breast, initial encounter: Secondary | ICD-10-CM | POA: Diagnosis not present

## 2023-05-27 NOTE — H&P (View-Only) (Signed)
Subjective:   CC: Breast wound, right, initial encounter [S21.001A]  HPI:  Brenda Barber is a 68 y.o. female who was referred by Margarito Liner* for evaluation of above. First noted a few days ago.  Symptoms include: Pain is sharp, drainage.  Exacerbated by touch.  Alleviated by nothing.  Associated with nothing.     Past Medical History:  has a past medical history of Aortic atherosclerosis (CMS-HCC), Asthma without status asthmaticus (HHS-HCC), Atrial fibrillation (CMS/HHS-HCC), COPD (chronic obstructive pulmonary disease) (CMS/HHS-HCC), History of fracture of right ankle, History of radiation therapy (03/2022), Hyperlipidemia, Hypertension, Lung cancer (CMS/HHS-HCC) (2023), Osteopenia, Post-surgical hypothyroidism, Postsurgical hypoparathyroidism (CMS/HHS-HCC), Sleep apnea on CPAP, Stage 3b chronic kidney disease (CKD) (CMS/HHS-HCC), Stroke (CMS/HHS-HCC) (2001), and Thyroid cancer (CMS/HHS-HCC) (2000).  Past Surgical History:  has a past surgical history that includes Status post thyroidectomy; Colonoscopy (01/05/2013); egd (01/05/2013); and Total hip arthroplasty anterior approach (Right, 06/26/2016).  Family History: family history includes Breast cancer in her sister; Cancer in her brother; Diabetes in her father; Emphysema in her mother; Heart disease in her mother; High blood pressure (Hypertension) in her father and mother; Kidney disease in her maternal grandfather; No Known Problems in her maternal grandmother; Pneumonia in her paternal grandfather and paternal grandmother; Stroke in her father.  Social History:  reports that she has never smoked. She has never used smokeless tobacco. She reports that she does not drink alcohol and does not use drugs.  Current Medications: has a current medication list which includes the following prescription(s): apixaban, ascorbic acid (vitamin c), atorvastatin, calcium carbonate, cyanocobalamin, cyclosporine, dapagliflozin propanediol, diltiazem,  donepezil, doxycycline, fluticasone propionate, trelegy ellipta, gemfibrozil, hydrocortisone, iron polysaccharides, isosorbide mononitrate, levalbuterol, levothyroxine, montelukast, multaq, multivitamin, niacin, nitroglycerin, potassium chloride, and sucralfate.  Allergies:  Allergies  Allergen Reactions   Aspirin-Dipyridamole Headache    REACTION: Headache AGGRENOX    Rosuvastatin Other (See Comments)    REACTION: Not effective    ROS:  A 15 point review of systems was performed and pertinent positives and negatives noted in HPI   Objective:     BP 124/77   Pulse 78   Ht 160 cm (5\' 3" )   Wt 68.5 kg (151 lb)   LMP  (LMP Unknown)   BMI 26.75 kg/m   Constitutional :  No distress, cooperative, alert  Lymphatics/Throat:  Supple with no lymphadenopathy  Respiratory:  Clear to auscultation bilaterally  Cardiovascular:  Regular rate and rhythm  Gastrointestinal: Soft, non-tender, non-distended, no organomegaly.  Musculoskeletal: Steady gait and movement  Skin: Cool and moist, chaperone present for exam.  Right breast area with healing former infected epidermal cyst site on right breast.  Still has moderate caseous drainage and granulation tissue  Psychiatric: Normal affect, non-agitated, not confused         LABS:  N/a   RADS: CLINICAL DATA:  Screening.   EXAM: DIGITAL SCREENING BILATERAL MAMMOGRAM WITH TOMOSYNTHESIS AND CAD   TECHNIQUE: Bilateral screening digital craniocaudal and mediolateral oblique mammograms were obtained. Bilateral screening digital breast tomosynthesis was performed. The images were evaluated with computer-aided detection.   COMPARISON:  Previous exam(s).   ACR Breast Density Category b: There are scattered areas of fibroglandular density.   FINDINGS: There are no findings suspicious for malignancy.   IMPRESSION: No mammographic evidence of malignancy. A result letter of this screening mammogram will be mailed directly to the patient.    RECOMMENDATION: Screening mammogram in one year. (Code:SM-B-01Y)   BI-RADS CATEGORY  1: Negative.     Electronically Signed  By: Elberta Fortis M.D.   On: 09/18/2022 10:14  Assessment:      Breast wound, right, initial encounter [S21.001A] Recommend OR debridment due to size and eliquis use  Plan:     1. Breast wound, right, initial encounter [S21.001A] Discussed surgical excision.  Alternatives include continued observation.  Benefits include possible symptom relief, pathologic evaluation, improved cosmesis. Discussed the risk of surgery including recurrence, chronic pain, post-op infxn, poor cosmesis, poor/delayed wound healing, and possible re-operation to address said risks. The risks of general anesthetic, if used, includes MI, CVA, sudden death or even reaction to anesthetic medications also discussed.  Typical post-op recovery time of 3-5 days with possible activity restrictions were also discussed.  The patient verbalized understanding and all questions were answered to the patient's satisfaction.  2. Patient has elected to proceed with surgical treatment. Procedure will be scheduled.  Hold eliquis 3days prior  labs/images/medications/previous chart entries reviewed personally and relevant changes/updates noted above.

## 2023-05-27 NOTE — H&P (Signed)
 Subjective:   CC: Breast wound, right, initial encounter [S21.001A]  HPI:  Brenda Barber is a 68 y.o. female who was referred by Margarito Liner* for evaluation of above. First noted a few days ago.  Symptoms include: Pain is sharp, drainage.  Exacerbated by touch.  Alleviated by nothing.  Associated with nothing.     Past Medical History:  has a past medical history of Aortic atherosclerosis (CMS-HCC), Asthma without status asthmaticus (HHS-HCC), Atrial fibrillation (CMS/HHS-HCC), COPD (chronic obstructive pulmonary disease) (CMS/HHS-HCC), History of fracture of right ankle, History of radiation therapy (03/2022), Hyperlipidemia, Hypertension, Lung cancer (CMS/HHS-HCC) (2023), Osteopenia, Post-surgical hypothyroidism, Postsurgical hypoparathyroidism (CMS/HHS-HCC), Sleep apnea on CPAP, Stage 3b chronic kidney disease (CKD) (CMS/HHS-HCC), Stroke (CMS/HHS-HCC) (2001), and Thyroid cancer (CMS/HHS-HCC) (2000).  Past Surgical History:  has a past surgical history that includes Status post thyroidectomy; Colonoscopy (01/05/2013); egd (01/05/2013); and Total hip arthroplasty anterior approach (Right, 06/26/2016).  Family History: family history includes Breast cancer in her sister; Cancer in her brother; Diabetes in her father; Emphysema in her mother; Heart disease in her mother; High blood pressure (Hypertension) in her father and mother; Kidney disease in her maternal grandfather; No Known Problems in her maternal grandmother; Pneumonia in her paternal grandfather and paternal grandmother; Stroke in her father.  Social History:  reports that she has never smoked. She has never used smokeless tobacco. She reports that she does not drink alcohol and does not use drugs.  Current Medications: has a current medication list which includes the following prescription(s): apixaban, ascorbic acid (vitamin c), atorvastatin, calcium carbonate, cyanocobalamin, cyclosporine, dapagliflozin propanediol, diltiazem,  donepezil, doxycycline, fluticasone propionate, trelegy ellipta, gemfibrozil, hydrocortisone, iron polysaccharides, isosorbide mononitrate, levalbuterol, levothyroxine, montelukast, multaq, multivitamin, niacin, nitroglycerin, potassium chloride, and sucralfate.  Allergies:  Allergies  Allergen Reactions   Aspirin-Dipyridamole Headache    REACTION: Headache AGGRENOX    Rosuvastatin Other (See Comments)    REACTION: Not effective    ROS:  A 15 point review of systems was performed and pertinent positives and negatives noted in HPI   Objective:     BP 124/77   Pulse 78   Ht 160 cm (5\' 3" )   Wt 68.5 kg (151 lb)   LMP  (LMP Unknown)   BMI 26.75 kg/m   Constitutional :  No distress, cooperative, alert  Lymphatics/Throat:  Supple with no lymphadenopathy  Respiratory:  Clear to auscultation bilaterally  Cardiovascular:  Regular rate and rhythm  Gastrointestinal: Soft, non-tender, non-distended, no organomegaly.  Musculoskeletal: Steady gait and movement  Skin: Cool and moist, chaperone present for exam.  Right breast area with healing former infected epidermal cyst site on right breast.  Still has moderate caseous drainage and granulation tissue  Psychiatric: Normal affect, non-agitated, not confused         LABS:  N/a   RADS: CLINICAL DATA:  Screening.   EXAM: DIGITAL SCREENING BILATERAL MAMMOGRAM WITH TOMOSYNTHESIS AND CAD   TECHNIQUE: Bilateral screening digital craniocaudal and mediolateral oblique mammograms were obtained. Bilateral screening digital breast tomosynthesis was performed. The images were evaluated with computer-aided detection.   COMPARISON:  Previous exam(s).   ACR Breast Density Category b: There are scattered areas of fibroglandular density.   FINDINGS: There are no findings suspicious for malignancy.   IMPRESSION: No mammographic evidence of malignancy. A result letter of this screening mammogram will be mailed directly to the patient.    RECOMMENDATION: Screening mammogram in one year. (Code:SM-B-01Y)   BI-RADS CATEGORY  1: Negative.     Electronically Signed  By: Elberta Fortis M.D.   On: 09/18/2022 10:14  Assessment:      Breast wound, right, initial encounter [S21.001A] Recommend OR debridment due to size and eliquis use  Plan:     1. Breast wound, right, initial encounter [S21.001A] Discussed surgical excision.  Alternatives include continued observation.  Benefits include possible symptom relief, pathologic evaluation, improved cosmesis. Discussed the risk of surgery including recurrence, chronic pain, post-op infxn, poor cosmesis, poor/delayed wound healing, and possible re-operation to address said risks. The risks of general anesthetic, if used, includes MI, CVA, sudden death or even reaction to anesthetic medications also discussed.  Typical post-op recovery time of 3-5 days with possible activity restrictions were also discussed.  The patient verbalized understanding and all questions were answered to the patient's satisfaction.  2. Patient has elected to proceed with surgical treatment. Procedure will be scheduled.  Hold eliquis 3days prior  labs/images/medications/previous chart entries reviewed personally and relevant changes/updates noted above.

## 2023-05-30 ENCOUNTER — Telehealth: Payer: Self-pay

## 2023-05-30 NOTE — Telephone Encounter (Signed)
 Patient called about having surgery on her L breast, she said she's taking eliquis  and wants to know if she needs to stop taking it before she has the surgery

## 2023-06-03 ENCOUNTER — Other Ambulatory Visit: Payer: Self-pay

## 2023-06-03 ENCOUNTER — Encounter
Admission: RE | Admit: 2023-06-03 | Discharge: 2023-06-03 | Disposition: A | Discharge status: Home / Self Care | Payer: Medicare HMO | Source: Ambulatory Visit | Attending: Surgery | Admitting: Surgery

## 2023-06-03 VITALS — Ht 63.0 in | Wt 151.0 lb

## 2023-06-03 DIAGNOSIS — I700 Atherosclerosis of aorta: Secondary | ICD-10-CM

## 2023-06-03 DIAGNOSIS — Z01810 Encounter for preprocedural cardiovascular examination: Secondary | ICD-10-CM

## 2023-06-03 DIAGNOSIS — I10 Essential (primary) hypertension: Secondary | ICD-10-CM

## 2023-06-03 DIAGNOSIS — I480 Paroxysmal atrial fibrillation: Secondary | ICD-10-CM

## 2023-06-03 DIAGNOSIS — Z01812 Encounter for preprocedural laboratory examination: Secondary | ICD-10-CM

## 2023-06-03 HISTORY — DX: Chronic kidney disease, stage 3a: N18.31

## 2023-06-03 HISTORY — DX: Paroxysmal atrial fibrillation: I48.0

## 2023-06-03 HISTORY — DX: Other specified disorders of bone density and structure, unspecified site: M85.80

## 2023-06-03 HISTORY — DX: Obstructive sleep apnea (adult) (pediatric): G47.33

## 2023-06-03 HISTORY — DX: Atherosclerosis of aorta: I70.0

## 2023-06-03 HISTORY — DX: Essential (primary) hypertension: I10

## 2023-06-03 NOTE — Telephone Encounter (Signed)
 Patient informed.

## 2023-06-03 NOTE — Patient Instructions (Addendum)
 Your procedure is scheduled on: Friday, January 17 Report to the Registration Desk on the 1st floor of the [facility] To find out your arrival time, please call [phone] between 1PM - 3PM on: Thursday, January 16 If your arrival time is 6:00 am, do not arrive before that time as the [facility] entrance doors do not open until 6:00 am.  REMEMBER: Instructions that are not followed completely may result in serious medical risk, up to and including death; or upon the discretion of your surgeon and anesthesiologist your surgery may need to be rescheduled.  Do not eat food after midnight the night before surgery.  No gum chewing or hard candies.  You may however, drink CLEAR liquids up to 2 hours before you are scheduled to arrive for your surgery. Do not drink anything within 2 hours of your scheduled arrival time.  Clear liquids include: - water  - apple juice without pulp - gatorade (not RED colors) - black coffee or tea (Do NOT add milk or creamers to the coffee or tea) Do NOT drink anything that is not on this list.  One week prior to surgery: starting today, January 13 Stop Anti-inflammatories (NSAIDS) such as Advil, Aleve, Ibuprofen, Motrin, Naproxen, Naprosyn and Aspirin  based products such as Excedrin, Goody's Powder, BC Powder. Stop ANY OVER THE COUNTER supplements until after surgery. Stop vitamin C , calcium , multiple vitamins.  You may however, continue to take Tylenol  if needed for pain up until the day of surgery.  Dapagliflozin  (Farxiga ) - hold 3 days before surgery. Last day to take is Monday, January 13. Resume AFTER surgery.  Per Dr. Estelita note and your instruction from Dr. CANDIE Bathe:  Eliquis  - hold 3 days before surgery. Last day to take Eliquis  is Monday, January 13. Resume AFTER surgery per surgeon instruction.  Continue taking all of your other prescription medications up until the day of surgery.  ON THE DAY OF SURGERY ONLY TAKE THESE MEDICATIONS WITH  SIPS OF WATER:  Diltiazem  fluticasone  (FLONASE )  gemfibrozil  (LOPID )  isosorbide  mononitrate (IMDUR ) levalbuterol (XOPENEX) 0.63 MG/3ML nebulizer  levothyroxine  (SYNTHROID )  MULTAQ   potassium chloride   sucralfate  (CARAFATE ) TRELEGY ELLIPTA  inhaler  No Alcohol  for 24 hours before or after surgery.  No Smoking including e-cigarettes for 24 hours before surgery.  No chewable tobacco products for at least 6 hours before surgery.  No nicotine patches on the day of surgery.  Do not use any recreational drugs for at least a week (preferably 2 weeks) before your surgery.  Please be advised that the combination of cocaine and anesthesia may have negative outcomes, up to and including death. If you test positive for cocaine, your surgery will be cancelled.  On the morning of surgery brush your teeth with toothpaste and water, you may rinse your mouth with mouthwash if you wish. Do not swallow any toothpaste or mouthwash.  Use CHG Soap as directed on instruction sheet.  Do not wear jewelry, make-up, hairpins, clips or nail polish.  For welded (permanent) jewelry: bracelets, anklets, waist bands, etc.  Please have this removed prior to surgery.  If it is not removed, there is a chance that hospital personnel will need to cut it off on the day of surgery.  Do not wear lotions, powders, or perfumes.   Do not shave body hair from the neck down 48 hours before surgery.  Contact lenses, hearing aids and dentures may not be worn into surgery.  Do not bring valuables to the hospital. [facility]  is not responsible for any missing/lost belongings or valuables.   Bring your C-PAP to the hospital in case you may have to spend the night.   Notify your doctor if there is any change in your medical condition (cold, fever, infection).  Wear comfortable clothing (specific to your surgery type) to the hospital.  After surgery, you can help prevent lung complications by doing breathing exercises.   Take deep breaths and cough every 1-2 hours.   If you are being discharged the day of surgery, you will not be allowed to drive home. You will need a responsible individual to drive you home and stay with you for 24 hours after surgery.   If you are taking public transportation, you will need to have a responsible individual with you.  Please call the Pre-admissions Testing Dept. at [phone] if you have any questions about these instructions.  Surgery Visitation Policy:  Patients having surgery or a procedure may have two visitors.  Children under the age of 76 must have an adult with them who is not the patient.      Preparing for Surgery with CHLORHEXIDINE  GLUCONATE (CHG) Soap  Chlorhexidine  Gluconate (CHG) Soap  o An antiseptic cleaner that kills germs and bonds with the skin to continue killing germs even after washing  o Used for showering the night before surgery and morning of surgery  Before surgery, you can play an important role by reducing the number of germs on your skin.  CHG (Chlorhexidine  gluconate) soap is an antiseptic cleanser which kills germs and bonds with the skin to continue killing germs even after washing.  Please do not use if you have an allergy to CHG or antibacterial soaps. If your skin becomes reddened/irritated stop using the CHG.  1. Shower the NIGHT BEFORE SURGERY and the MORNING OF SURGERY with CHG soap.  2. If you choose to wash your hair, wash your hair first as usual with your normal shampoo.  3. After shampooing, rinse your hair and body thoroughly to remove the shampoo.  4. Use CHG as you would any other liquid soap. You can apply CHG directly to the skin and wash gently with a scrungie or a clean washcloth.  5. Apply the CHG soap to your body only from the neck down. Do not use on open wounds or open sores. Avoid contact with your eyes, ears, mouth, and genitals (private parts). Wash face and genitals (private parts) with your  normal soap.  6. Wash thoroughly, paying special attention to the area where your surgery will be performed.  7. Thoroughly rinse your body with warm water.  8. Do not shower/wash with your normal soap after using and rinsing off the CHG soap.  9. Pat yourself dry with a clean towel.  10. Wear clean pajamas to bed the night before surgery.  12. Place clean sheets on your bed the night of your first shower and do not sleep with pets.  13. Shower again with the CHG soap on the day of surgery prior to arriving at the hospital.  14. Do not apply any deodorants/lotions/powders.  15. Please wear clean clothes to the hospital.

## 2023-06-04 ENCOUNTER — Encounter
Admission: RE | Admit: 2023-06-04 | Discharge: 2023-06-04 | Disposition: A | Discharge status: Home / Self Care | Payer: Medicare HMO | Source: Ambulatory Visit | Attending: Surgery | Admitting: Surgery

## 2023-06-04 DIAGNOSIS — I700 Atherosclerosis of aorta: Secondary | ICD-10-CM | POA: Diagnosis not present

## 2023-06-04 DIAGNOSIS — Z01810 Encounter for preprocedural cardiovascular examination: Secondary | ICD-10-CM | POA: Diagnosis not present

## 2023-06-04 DIAGNOSIS — I10 Essential (primary) hypertension: Secondary | ICD-10-CM | POA: Insufficient documentation

## 2023-06-04 DIAGNOSIS — Z01812 Encounter for preprocedural laboratory examination: Secondary | ICD-10-CM

## 2023-06-04 DIAGNOSIS — I480 Paroxysmal atrial fibrillation: Secondary | ICD-10-CM | POA: Diagnosis not present

## 2023-06-06 ENCOUNTER — Encounter: Payer: Self-pay | Admitting: Surgery

## 2023-06-06 DIAGNOSIS — M40204 Unspecified kyphosis, thoracic region: Secondary | ICD-10-CM

## 2023-06-06 MED ORDER — ORAL CARE MOUTH RINSE: 15.0000 mL | Freq: Once | OROMUCOSAL | Status: AC

## 2023-06-06 MED ORDER — CHLORHEXIDINE GLUCONATE 0.12 % MT SOLN
15.0000 mL | Freq: Once | OROMUCOSAL | Status: AC
  Administered 2023-06-07: 15 mL via OROMUCOSAL

## 2023-06-06 MED ORDER — CEFAZOLIN SODIUM-DEXTROSE 2-4 GM/100ML-% IV SOLN
2.0000 g | INTRAVENOUS | Status: AC
  Administered 2023-06-07: 2 g via INTRAVENOUS

## 2023-06-06 MED ORDER — SODIUM CHLORIDE 0.9 % IV SOLN: INTRAVENOUS | Status: DC

## 2023-06-06 MED ORDER — CHLORHEXIDINE GLUCONATE CLOTH 2 % EX PADS: 6.0000 | MEDICATED_PAD | Freq: Once | CUTANEOUS | Status: DC

## 2023-06-06 NOTE — Progress Notes (Signed)
Perioperative / Anesthesia Services  Pre-Admission Testing Clinical Review / Pre-Operative Anesthesia Consult  Date: 06/06/23  Patient Demographics:  Name: Brenda Barber DOB: [birth year] MRN:   [id number]  Planned Surgical Procedure(s):    Case: [id number] Date/Time: 06/07/23 0859   Procedure: DEBRIDEMENT WOUND (Right: Breast)   Anesthesia type: General   Pre-op diagnosis: S21.001A breast wound, rt   Location: [facility] OR ROOM 05 / [facility]   Surgeons: Sung Amabile, DO      NOTE: Available PAT nursing documentation and vital signs have been reviewed. Clinical nursing staff has updated patient's PMH/PSHx, current medication list, and drug allergies/intolerances to ensure comprehensive history available to assist in medical decision making as it pertains to the aforementioned surgical procedure and anticipated anesthetic course. Extensive review of available clinical information personally performed. [place] PMH and PSHx updated with any diagnoses/procedures that  may have been inadvertently omitted during his intake with the pre-admission testing department's nursing staff.  Clinical Discussion:  Brenda Barber is a 69 y.o. female who is submitted for pre-surgical anesthesia review and clearance prior to her undergoing the above procedure. Patient has never been a smoker in the past. Pertinent PMH includes: CAD, PAF, cardiac murmur, lacunar infarct, chronic cerebral microvascular disease, aortic atherosclerosis, carotid artery disease, HTN, HLD, prediabetes, remote stage I follicular thyroid cancer s/p resection with postoperative hypothyroidism and hypoparathyroidism, CKD-III, dyspnea, asthma, COPD, stage I NSCLC (LLL), OSAH (requires nocturnal PAP therapy), GERD (on Carafate), Chiari malformation, seizures, anemia, thyroid cancer (1999), thoracolumbar DDD, thoracic kyphosis, depression, personality disorder (depressive), memory loss.   Patient is followed by cardiology  Welton Flakes, MD). She was last seen in the cardiology clinic on 04/01/2023; notes reviewed. At the time of her clinic visit, patient doing well overall from a cardiovascular perspective. She was experiencing chronic shortness of breath related to her asthma-COPD overlap syndrome and pulmonary malignancy. Patient denied any chest pain, PND, orthopnea, palpitations, significant peripheral edema, weakness, fatigue, vertiginous symptoms, or presyncope/syncope. Patient with a past medical history significant for cardiovascular diagnoses. Documented physical exam was grossly benign, providing no evidence of acute exacerbation and/or decompensation of the patient's known cardiovascular conditions.  Diagnostic LEFT heart catheterization was performed on 11/18/2017. Study revealed normal LV function and EF. LVEDP was normal. Patient with normal coronary anatomy and no evidence of obstructive coronary artery disease.   Myocardial perfusion imaging study performed on 01/25/2023 revealed a normal left ventricular systolic function with a hyperdynamic LVEF of 71%.  There was normal myocardial thickening and wall motion.  No artifact is noted.  Left ventricular cavity size normal.  There was no evidence of stress-induced myocardial ischemia or arrhythmia; no scintigraphic evidence of scar. Patient remained asymptomatic throughout the study. Study determined to be normal and low risk.  Most recent TTE was performed on 01/28/2023 revealing normal left ventricular systolic function with a hyperdynamic LVEF of 70.6%. There were no regional wall motion abnormalities. Mild LVH observed. Left ventricular diastolic Doppler parameters consistent with a restrictive filling pattern (G3DD). RVSP normal. Left atrium mildly dilated. Mild MR. Aortic valve was fibrocalcified with trace regurgitation. All transvalvular gradients were noted to be normal providing no evidence suggestive of valvular stenosis. Aorta normal in size with no evidence of  aneurysmal dilatation.  Patient with an atrial fibrillation diagnosis; CHA2DS2-VASc Score = 6 (age, sex, HTN, CVA x 2, vascular disease history ). Her rate and rhythm are currently being maintained on oral diltiazem + dronaderone. She is chronically anticoagulated using apixaban; reported to  be compliant with therapy with no evidence or reports of GI/GU bleeding. Blood pressure well controlled at 108/72 mmHg on currently prescribed CCM (diltiazem) and nitrate (isosorbide mononitrate) therapies. Patient is on atorvastatin for his HLD diagnosis and ASCVD prevention.  Patient has a supply of short acting nitrates (NTG) to use on a as needed basis for recurrent angina/anginal equivalent symptoms; denied recent use.  Patient has a prediabetes diagnosis; last HgbA1c was 5.7% when checked on 05/10/2023. Additionally, patient is on an SGLT2i (dapagliflozin) for added cardiovascular and renovascular protection. She does have an OSAH diagnosis and is reported to be compliant with prescribed nocturnal PAP therapy. Functional capacity limited by respiratory status and other medical comorbidities. With that said, patient is able to achieve all of her ADL/IADLs without significant cardiovascular limitations. Per the DASI, patient questionably able to achieve 4 METS of physical activity without experiencing, at least to some degree, significant angina/anginal equivalent symptoms. No changes were made to her medication regimen during her visit with cardiology.  Patient scheduled to follow-up with outpatient cardiology in 4 months or sooner if needed.  [facility] is scheduled for an elective RIGHT BREAST WOUND DEBRIDEMENT on 06/07/2023 with Dr. Sung Amabile, DO. Given patient's past medical history significant for cardiovascular diagnoses, presurgical cardiac clearance was sought by the PAT team. Per cardiology, "this patient is optimized for surgery and may proceed with the planned procedural course with a LOW risk of  significant perioperative cardiovascular complications".  Again, this patient is on daily oral anticoagulation therapy using a DOAC medication.  She has been instructed on recommendations for holding her apixaban for 3 days prior to her procedure with plans to restart as soon as postoperative bleeding risk felt to be minimized by his attending surgeon. The patient has been instructed that her last dose of apixaban should be on 06/03/2023.  Patient reports previous perioperative complications with anesthesia in the past.Patient has a PMH (+) for PONV. Symptoms and history of PONV will be discussed with patient by anesthesia team on the day of her procedure. Interventions will be ordered as deemed necessary based on patient's individual care needs as determined by anesthesiologist.  In review her EMR, it is noted that patient underwent a general anesthetic course here at [facility] ([facility]) in 12/2021 without documented complications.      06/03/2023   11:37 AM 04/01/2023    1:14 PM 02/27/2023   11:11 AM  Vitals with BMI  Height 5\' 3"  5\' 3"    Weight 151 lbs 151 lbs 10 oz   BMI 26.76 26.86   Systolic  108 147  Diastolic  72 93  Pulse  90    Providers/Specialists:  NOTE: Primary physician provider listed below. Patient may have been seen by APP or partner within same practice.   PROVIDER ROLE / SPECIALTY LAST Michelle Nasuti, DO General Surgery (Surgeon) 05/27/2023  Barbette Reichmann, MD Primary Care Provider 05/21/2023  Adrian Blackwater, MD Cardiology 04/01/2023  [name], MD Radiation Oncology 02/27/2023  Louretta Shorten, MD Medical Oncology 03/05/2022  Ned Clines, MD Pulmonary Medicine 04/03/2023  Lorain Childes, MD Nephrology 03/14/2023  Theora Master, MD Neurology 03/06/2023  Wendall Mola, MD Endocrinology 02/11/2023   Allergies:   Allergies  Allergen Reactions   Aspirin-Dipyridamole Er Other (See Comments)    REACTION:  Headache AGGRENOX    Rosuvastatin Other (See Comments)    REACTION: Not effective   Current Home Medications:    0.9 %  sodium chloride  infusion   [START ON 06/07/2023] ceFAZolin (ANCEF) IVPB 2g/100 mL premix   chlorhexidine (PERIDEX) 0.12 % solution 15 mL   Or   Oral care mouth rinse   Chlorhexidine Gluconate Cloth 2 % PADS 6 each    acetaminophen (TYLENOL) 500 MG tablet   atorvastatin (LIPITOR) 80 MG tablet   calcium carbonate (OS-CAL - DOSED IN MG OF ELEMENTAL CALCIUM) 1250 (500 Ca) MG tablet   cyanocobalamin (VITAMIN B12) 1000 MCG tablet   DILT-XR 120 MG 24 hr capsule   donepezil (ARICEPT) 10 MG tablet   ELIQUIS 5 MG TABS tablet   FARXIGA 10 MG TABS tablet   fluticasone (FLONASE) 50 MCG/ACT nasal spray   gemfibrozil (LOPID) 600 MG tablet   hydrocortisone 2.5 % lotion   iron polysaccharides (NIFEREX) 150 MG capsule   isosorbide mononitrate (IMDUR) 30 MG 24 hr tablet   levalbuterol (XOPENEX) 0.63 MG/3ML nebulizer solution   levothyroxine (SYNTHROID) 112 MCG tablet   montelukast (SINGULAIR) 10 MG tablet   MULTAQ 400 MG tablet   Multiple Vitamin (MULTIVITAMIN) tablet   niacin (VITAMIN B3) 500 MG tablet   nitroGLYCERIN (NITROSTAT) 0.4 MG SL tablet   potassium chloride (KLOR-CON) 10 MEQ tablet   RESTASIS 0.05 % ophthalmic emulsion   sucralfate (CARAFATE) 1 g tablet   TRELEGY ELLIPTA 100-62.5-25 MCG/ACT AEPB   vitamin C (ASCORBIC ACID) 500 MG tablet   History:   Past Medical History:  Diagnosis Date   Abscess of right breast 05/2023   Allergy    Anemia    Aortic atherosclerosis (HCC)    Asthma    Carotid artery disease (HCC)    a.) carotid doppler 01/03/2017: 1-49% RICA   Chiari I malformation (HCC)    Chronic cerebral microvascular disease    COPD (chronic obstructive pulmonary disease) (HCC)    Coronary artery disease    DDD (degenerative disc disease), thoracolumbar    Depression    Diastolic dysfunction    a.) TTE 03/08/2020: EF 55-60%, mild LVH, G1DD, mod  MAC, triv MR, AoV sclerosis without stenosis; b.) TTE 01/25/2023: EF 70.6%, mild LVH, G3DD, AoV calc/sclerosis without stenosis, triv AR/TR, mild MR   Dyspnea    Essential hypertension    GERD (gastroesophageal reflux disease)    Heart murmur    History of bilateral cataract extraction    History of left heart catheterization 11/18/2017   a.) LHC 11/18/2017: normal coronaries; no CAD   History of radiation therapy 2023   Hyperlipidemia    Hypoparathyroidism (HCC)    a.) s/p throidectomy   Lacunar infarction (HCC) 03/21/2000   a.) LEFT lentiform nucleus extending into the LEFT caudate nucleus; no residual deficits   Malignant neoplasm of lower lobe of left lung (HCC) 02/2022   a.) stage I NSCLC in LLL   Memory loss    a.) on acetylcholinesterase inhibitor (donepazil)   Obstructive sleep apnea on CPAP    On apixaban therapy    On dronedarone therapy    Osteopenia    Paroxysmal A-fib (HCC)    a.) CHA2DS2-VASc = 6 (age, sex, HTN, CVA x2, vascular disease history) as of 06/06/2023; b.) cardiac rate/rhythm maintained on oral diltiazem + dronaderone; chronically anticoagulated using apixaban   Personality disorder, depressive    Pneumonia    PONV (postoperative nausea and vomiting)    difficulty breathing during endoscopy, colonoscopy performed prior w/out problems-NAUSEA ONLY   Postoperative hypothyroidism    a.) s/p thyroidectomy   Pre-diabetes    Stage 3a chronic kidney disease (CKD) (  HCC)    Thoracic kyphosis    Thyroid cancer (HCC) 1999   a.) stage I follicular thyroid cancer (T3N0Mx)   Past Surgical History:  Procedure Laterality Date   CATARACT EXTRACTION W/ INTRAOCULAR LENS  IMPLANT, BILATERAL Bilateral    COLONOSCOPY WITH ESOPHAGOGASTRODUODENOSCOPY (EGD)  01/05/2013   ELECTROMAGNETIC NAVIGATION BROCHOSCOPY Left 10/24/2018   Procedure: ELECTROMAGNETIC NAVIGATION BRONCHOSCOPY LEFT;  Surgeon: Salena Saner, MD;  Location: [facility] ORS;  Service: Cardiopulmonary;  Laterality:  Left;   LEFT HEART CATH AND CORONARY ANGIOGRAPHY Right 11/18/2017   Procedure: Left Heart Cath with possible coronary intervention;  Surgeon: Laurier Nancy, MD;  Location: [facility] INVASIVE CV LAB;  Service: Cardiovascular;  Laterality: Right;   TONSILLECTOMY     TOTAL HIP ARTHROPLASTY Right 06/26/2016   Procedure: TOTAL HIP ARTHROPLASTY ANTERIOR APPROACH;  Surgeon: Kennedy Bucker, MD;  Location: [facility] ORS;  Service: Orthopedics;  Laterality: Right;   TOTAL THYROIDECTOMY  2000   VIDEO BRONCHOSCOPY WITH ENDOBRONCHIAL NAVIGATION Left 04/01/2020   Procedure: VIDEO BRONCHOSCOPY WITH ENDOBRONCHIAL NAVIGATION;  Surgeon: Salena Saner, MD;  Location: [facility] ORS;  Service: Pulmonary;  Laterality: Left;   VIDEO BRONCHOSCOPY WITH ENDOBRONCHIAL ULTRASOUND N/A 01/12/2022   Procedure: VIDEO BRONCHOSCOPY WITH ENDOBRONCHIAL ULTRASOUND;  Surgeon: Vida Rigger, MD;  Location: [facility] ORS;  Service: Thoracic;  Laterality: N/A;   Family History  Problem Relation Age of Onset   Diabetes Father    Diabetes Paternal Aunt    Diabetes Paternal Uncle    Breast cancer Sister 44   Social History   Tobacco Use   Smoking status: Never   Smokeless tobacco: Never  Substance Use Topics   Alcohol use: No   Pertinent Clinical Results:  LABS:    Component Ref Range & Units 05/10/2023  WBC (White Blood Cell Count) 4.1 - 10.2 10^3/uL 5.9  RBC (Red Blood Cell Count) 4.04 - 5.48 10^6/uL 4.7  Hemoglobin 12.0 - 15.0 gm/dL 16.1  Hematocrit 09.6 - 47.0 % 43.9  MCV (Mean Corpuscular Volume) 80.0 - 100.0 fl 93.4  MCH (Mean Corpuscular Hemoglobin) 27.0 - 31.2 pg 29.6  MCHC (Mean Corpuscular Hemoglobin Concentration) 32.0 - 36.0 gm/dL 04.5 Low   Platelet Count 150 - 450 10^3/uL 224  RDW-CV (Red Cell Distribution Width) 11.6 - 14.8 % 15.7 High   MPV (Mean Platelet Volume)   Neutrophils 1.50 - 7.80 10^3/uL 4.24  Lymphocytes 1.00 - 3.60 10^3/uL 0.99 Low   Monocytes 0.00 - 1.50 10^3/uL 0.46  Eosinophils 0.00 -  0.55 10^3/uL 0.12  Basophils 0.00 - 0.09 10^3/uL 0.04  Neutrophil % 32.0 - 70.0 % 72.2 High   Lymphocyte % 10.0 - 50.0 % 16.8  Monocyte % 4.0 - 13.0 % 7.8  Eosinophil % 1.0 - 5.0 % 2  Basophil% 0.0 - 2.0 % 0.7  Immature Granulocyte % <=0.7 % 0.5  Immature Granulocyte Count <=0.06 10^3/L 0.03  Resulting Agency [facility] - LAB  Specimen Collected: 05/10/23 09:01   Performed by: [facility] Last Resulted: 05/10/23 10:25  Received From: [facility]  Result Received: 05/27/23 14:04   Component Ref Range & Units 05/10/2023  Glucose 70 - 110 mg/dL 84  Sodium 409 - 811 mmol/L 148 High   Potassium 3.6 - 5.1 mmol/L 3.9  Chloride 97 - 109 mmol/L 109  Carbon Dioxide (CO2) 22.0 - 32.0 mmol/L 27.6  Urea Nitrogen (BUN) 7 - 25 mg/dL 21  Creatinine 0.6 - 1.1 mg/dL 1.2 High   Glomerular Filtration Rate (eGFR) >60  mL/min/1.73sq m 50 Low   Calcium 8.7 - 10.3 mg/dL 9.3  AST 8 - 39 U/L 20  ALT 5 - 38 U/L 14  Alk Phos (alkaline Phosphatase) 34 - 104 U/L 145 High   Albumin 3.5 - 4.8 g/dL 4.5  Bilirubin, Total 0.3 - 1.2 mg/dL 0.5  Protein, Total 6.1 - 7.9 g/dL 6.8  A/G Ratio 1.0 - 5.0 gm/dL 2  Resulting Agency [facility]  Specimen Collected: 05/10/23 09:01   Performed by: [facility] Last Resulted: 05/10/23 15:53  Received From: [facility]  Result Received: 05/27/23 14:04    Component Ref Range & Units 05/10/2023  Hemoglobin A1C 4.2 - 5.6 % 5.7 High   Average Blood Glucose (Calc) mg/dL 409   Normal Range:    4.2 - 5.6% Increased Risk:  5.7 - 6.4% Diabetes:        >= 6.5% Glycemic Control for adults with diabetes:  <7%   Specimen Collected: 05/10/23 09:01   Performed by: [facility] Last Resulted: 05/10/23 14:14  Received From: [facility]  Result Received: 05/27/23 14:04    ECG: Date: 06/04/2023  Time ECG obtained: 1309 PM Rate: 82  bpm Rhythm: normal sinus Axis (leads I and aVF): normal Intervals: PR 150 ms. QRS 74 ms. QTc 472 ms. ST segment and T wave changes: Non=specific ST abnormality; borderline prolonged QTc Comparison: Similar to previous tracing obtained on 01/15/2022   IMAGING / PROCEDURES: TRANSTHORACIC ECHOCARDIOGRAM performed on 01/25/2023 Normal left ventricular systolic function with an EF of 70.6% Mild LVH Left ventricular diastolic Doppler parameters consistent with a restrictive filling pattern (G3DD). Normal right ventricular systolic function Aortic calcification/sclerosis Trivial AR and TR Mild MR Normal transvalvular gradients; no valvular stenosis No pericardial effusion  MYOCARDIAL PERFUSION IMAGING STUDY (LEXISCAN) performed on 01/22/2023 Normal left ventricular systolic function with a normal LVEF of 71% Normal myocardial thickening and wall motion Left ventricular cavity size normal Small mild reversible apex (17) wall defect most likely due to breast attenuation artifact. No evidence of stress-induced myocardial ischemia or arrhythmia Normal low risk study  LEFT HEART CATHETERIZATION AND CORONARY ANGIOGRAPHY performed on 11/18/2017 Normal left ventricular systolic function and EF Normal LVEDP Normal coronaries  US CAROTID BILATERAL performed on 12/24/2016 Mild (1-49%) stenosis proximal right internal carotid artery secondary to heterogenous atherosclerotic plaque. No significant atherosclerotic plaque or evidence of stenosis in the left internal carotid artery. Vertebral arteries are patent with normal antegrade flow.  Impression and Plan:  [facility] has been referred for pre-anesthesia review and clearance prior to her undergoing the planned anesthetic and procedural courses. Available labs, pertinent testing, and imaging results were personally reviewed by me in preparation for upcoming operative/procedural course. [facility] medical record has been updated following  extensive record review and patient interview with PAT staff.   This patient has been appropriately cleared by cardiology with an overall LOW risk of experiencing significant perioperative cardiovascular complications. Based on clinical review performed today (06/06/23), barring any significant acute changes in the patient's overall condition, it is anticipated that she will be able to proceed with the planned surgical intervention. Any acute changes in clinical condition may necessitate her procedure being postponed and/or cancelled. Patient will meet with anesthesia team (MD and/or CRNA) on the day of her procedure for preoperative evaluation/assessment. Questions regarding anesthetic course will be fielded at that time.   Pre-surgical instructions were reviewed with the patient during his PAT appointment, and  questions were fielded to satisfaction by PAT clinical staff. She has been instructed on which medications that she will need to hold prior to surgery, as well as the ones that have been deemed safe/appropriate to take on the day of his procedure. As part of the general education provided by PAT, patient made aware both verbally and in writing, that she would need to abstain from the use of any illegal substances during his perioperative course. She was advised that failure to follow the provided instructions could necessitate case cancellation or result in serious perioperative complications up to and including death. Patient encouraged to contact PAT and/or her surgeon's office to discuss any questions or concerns that may arise prior to surgery; verbalized understanding.   Quentin Mulling, MSN, APRN, FNP-C, CEN [facility]  Perioperative Services Nurse Practitioner Phone: [phone] Fax: [phone] 06/06/23 11:29 PM  NOTE: This note has been prepared using Dragon dictation software. Despite my best ability to proofread, there is always the potential that unintentional  transcriptional errors may still occur from this process.

## 2023-06-07 ENCOUNTER — Encounter: Payer: Self-pay | Admitting: Surgery

## 2023-06-07 ENCOUNTER — Ambulatory Visit: Payer: Medicare HMO | Admitting: Urgent Care

## 2023-06-07 ENCOUNTER — Other Ambulatory Visit: Payer: Self-pay

## 2023-06-07 ENCOUNTER — Ambulatory Visit
Admission: RE | Admit: 2023-06-07 | Discharge: 2023-06-07 | Disposition: A | Discharge status: Home / Self Care | Payer: Medicare HMO | Attending: Surgery | Admitting: Surgery

## 2023-06-07 ENCOUNTER — Encounter
Admission: RE | Disposition: A | Discharge status: Home / Self Care | Payer: Self-pay | Source: Home / Self Care | Attending: Surgery

## 2023-06-07 DIAGNOSIS — I2510 Atherosclerotic heart disease of native coronary artery without angina pectoris: Secondary | ICD-10-CM | POA: Diagnosis not present

## 2023-06-07 DIAGNOSIS — Z7901 Long term (current) use of anticoagulants: Secondary | ICD-10-CM | POA: Diagnosis not present

## 2023-06-07 DIAGNOSIS — I130 Hypertensive heart and chronic kidney disease with heart failure and stage 1 through stage 4 chronic kidney disease, or unspecified chronic kidney disease: Secondary | ICD-10-CM | POA: Diagnosis not present

## 2023-06-07 DIAGNOSIS — L720 Epidermal cyst: Secondary | ICD-10-CM | POA: Insufficient documentation

## 2023-06-07 DIAGNOSIS — Z01812 Encounter for preprocedural laboratory examination: Secondary | ICD-10-CM

## 2023-06-07 DIAGNOSIS — N1832 Chronic kidney disease, stage 3b: Secondary | ICD-10-CM | POA: Insufficient documentation

## 2023-06-07 DIAGNOSIS — E785 Hyperlipidemia, unspecified: Secondary | ICD-10-CM | POA: Diagnosis not present

## 2023-06-07 DIAGNOSIS — I509 Heart failure, unspecified: Secondary | ICD-10-CM | POA: Diagnosis not present

## 2023-06-07 DIAGNOSIS — Z85118 Personal history of other malignant neoplasm of bronchus and lung: Secondary | ICD-10-CM | POA: Diagnosis not present

## 2023-06-07 DIAGNOSIS — E892 Postprocedural hypoparathyroidism: Secondary | ICD-10-CM | POA: Insufficient documentation

## 2023-06-07 DIAGNOSIS — I700 Atherosclerosis of aorta: Secondary | ICD-10-CM | POA: Insufficient documentation

## 2023-06-07 DIAGNOSIS — M40204 Unspecified kyphosis, thoracic region: Secondary | ICD-10-CM

## 2023-06-07 DIAGNOSIS — N183 Chronic kidney disease, stage 3 unspecified: Secondary | ICD-10-CM | POA: Diagnosis not present

## 2023-06-07 DIAGNOSIS — N611 Abscess of the breast and nipple: Secondary | ICD-10-CM

## 2023-06-07 DIAGNOSIS — Z8673 Personal history of transient ischemic attack (TIA), and cerebral infarction without residual deficits: Secondary | ICD-10-CM | POA: Insufficient documentation

## 2023-06-07 DIAGNOSIS — Z923 Personal history of irradiation: Secondary | ICD-10-CM | POA: Diagnosis not present

## 2023-06-07 DIAGNOSIS — J4489 Other specified chronic obstructive pulmonary disease: Secondary | ICD-10-CM | POA: Insufficient documentation

## 2023-06-07 DIAGNOSIS — E890 Postprocedural hypothyroidism: Secondary | ICD-10-CM | POA: Insufficient documentation

## 2023-06-07 DIAGNOSIS — G4733 Obstructive sleep apnea (adult) (pediatric): Secondary | ICD-10-CM | POA: Diagnosis not present

## 2023-06-07 DIAGNOSIS — S21001A Unspecified open wound of right breast, initial encounter: Secondary | ICD-10-CM | POA: Diagnosis not present

## 2023-06-07 HISTORY — DX: Cataract extraction status, right eye: Z98.42

## 2023-06-07 HISTORY — DX: Cataract extraction status, right eye: Z98.41

## 2023-06-07 HISTORY — DX: Long term (current) use of anticoagulants: Z79.01

## 2023-06-07 HISTORY — PX: WOUND DEBRIDEMENT: SHX247

## 2023-06-07 HISTORY — DX: Other cerebrovascular disease: I67.89

## 2023-06-07 HISTORY — DX: Other amnesia: R41.3

## 2023-06-07 HISTORY — DX: Other long term (current) drug therapy: Z79.899

## 2023-06-07 HISTORY — DX: Other intervertebral disc degeneration, thoracolumbar region: M51.35

## 2023-06-07 HISTORY — DX: Postprocedural hypothyroidism: E89.0

## 2023-06-07 HISTORY — DX: Disorder of arteries and arterioles, unspecified: I77.9

## 2023-06-07 HISTORY — DX: Other ill-defined heart diseases: I51.89

## 2023-06-07 HISTORY — DX: Unspecified kyphosis, thoracic region: M40.204

## 2023-06-07 LAB — GLUCOSE, CAPILLARY
Glucose-Capillary: 86 mg/dL (ref 70–99)
Glucose-Capillary: 104 mg/dL — ABNORMAL HIGH (ref 70–99)

## 2023-06-07 SURGERY — DEBRIDEMENT, WOUND
Anesthesia: General | Site: Breast | Laterality: Right

## 2023-06-07 MED ORDER — PROPOFOL 1000 MG/100ML IV EMUL
INTRAVENOUS | Status: AC
  Filled 2023-06-07: qty 100

## 2023-06-07 MED ORDER — 0.9 % SODIUM CHLORIDE (POUR BTL) OPTIME
TOPICAL | Status: DC | PRN
  Administered 2023-06-07: 20 mL

## 2023-06-07 MED ORDER — LIDOCAINE HCL (PF) 1 % IJ SOLN
INTRAMUSCULAR | Status: AC
  Filled 2023-06-07: qty 30

## 2023-06-07 MED ORDER — CHLORHEXIDINE GLUCONATE 0.12 % MT SOLN
OROMUCOSAL | Status: AC
  Filled 2023-06-07: qty 15

## 2023-06-07 MED ORDER — LIDOCAINE HCL (CARDIAC) PF 100 MG/5ML IV SOSY
PREFILLED_SYRINGE | INTRAVENOUS | Status: DC | PRN
  Administered 2023-06-07: 100 mg via INTRAVENOUS

## 2023-06-07 MED ORDER — DOCUSATE SODIUM 100 MG PO CAPS
100.0000 mg | ORAL_CAPSULE | Freq: Two times a day (BID) | ORAL | 0 refills | Status: AC | PRN
Start: 2023-06-07 — End: 2023-06-17

## 2023-06-07 MED ORDER — LIDOCAINE HCL 1 % IJ SOLN
INTRAMUSCULAR | Status: DC | PRN
  Administered 2023-06-07: 25 mL

## 2023-06-07 MED ORDER — BUPIVACAINE-EPINEPHRINE (PF) 0.25% -1:200000 IJ SOLN
INTRAMUSCULAR | Status: AC
  Filled 2023-06-07: qty 30

## 2023-06-07 MED ORDER — ONDANSETRON HCL 4 MG/2ML IJ SOLN
INTRAMUSCULAR | Status: DC | PRN
  Administered 2023-06-07: 4 mg via INTRAVENOUS

## 2023-06-07 MED ORDER — LIDOCAINE HCL URETHRAL/MUCOSAL 2 % EX GEL
CUTANEOUS | Status: DC | PRN
  Administered 2023-06-07: 1 via TOPICAL

## 2023-06-07 MED ORDER — PROPOFOL 10 MG/ML IV BOLUS
INTRAVENOUS | Status: DC | PRN
  Administered 2023-06-07: 100 ug/kg/min via INTRAVENOUS

## 2023-06-07 MED ORDER — HYDROCODONE-ACETAMINOPHEN 5-325 MG PO TABS: 1.0000 | ORAL_TABLET | Freq: Four times a day (QID) | ORAL | 0 refills | Status: DC | PRN

## 2023-06-07 MED ORDER — DEXMEDETOMIDINE HCL IN NACL 80 MCG/20ML IV SOLN
INTRAVENOUS | Status: DC | PRN
  Administered 2023-06-07: 8 ug via INTRAVENOUS

## 2023-06-07 MED ORDER — LACTATED RINGERS IV SOLN: INTRAVENOUS | Status: DC | PRN

## 2023-06-07 MED ORDER — FENTANYL CITRATE (PF) 100 MCG/2ML IJ SOLN: 25.0000 ug | INTRAMUSCULAR | Status: DC | PRN

## 2023-06-07 MED ORDER — DEXAMETHASONE SODIUM PHOSPHATE 10 MG/ML IJ SOLN
INTRAMUSCULAR | Status: DC | PRN
  Administered 2023-06-07: 10 mg via INTRAVENOUS

## 2023-06-07 MED ORDER — CEFAZOLIN SODIUM-DEXTROSE 2-4 GM/100ML-% IV SOLN
INTRAVENOUS | Status: AC
  Filled 2023-06-07: qty 100

## 2023-06-07 SURGICAL SUPPLY — 30 items
BLADE SURG 15 STRL LF DISP TIS (BLADE) ×2 IMPLANT
CHLORAPREP W/TINT 26 (MISCELLANEOUS) IMPLANT
DERMABOND ADVANCED .7 DNX12 (GAUZE/BANDAGES/DRESSINGS) IMPLANT
DRAPE LAPAROTOMY 100X77 ABD (DRAPES) ×2 IMPLANT
ELECT REM PT RETURN 9FT ADLT (ELECTROSURGICAL) ×1
ELECTRODE REM PT RTRN 9FT ADLT (ELECTROSURGICAL) ×2 IMPLANT
GAUZE 4X4 16PLY ~~LOC~~+RFID DBL (SPONGE) IMPLANT
GAUZE PACKING IODOFORM 1/2INX (GAUZE/BANDAGES/DRESSINGS) IMPLANT
GLOVE BIOGEL PI IND STRL 7.0 (GLOVE) ×2 IMPLANT
GLOVE SURG SYN 6.5 ES PF (GLOVE) ×1 IMPLANT
GLOVE SURG SYN 6.5 PF PI (GLOVE) ×2 IMPLANT
GOWN STRL REUS W/ TWL LRG LVL3 (GOWN DISPOSABLE) ×4 IMPLANT
LABEL OR SOLS (LABEL) ×2 IMPLANT
MANIFOLD NEPTUNE II (INSTRUMENTS) ×2 IMPLANT
NDL HYPO 22X1.5 SAFETY MO (MISCELLANEOUS) IMPLANT
NEEDLE HYPO 22X1.5 SAFETY MO (MISCELLANEOUS) IMPLANT
NS IRRIG 500ML POUR BTL (IV SOLUTION) ×2 IMPLANT
PACK BASIN MINOR ARMC (MISCELLANEOUS) ×2 IMPLANT
SOL PREP PVP 2OZ (MISCELLANEOUS)
SOLUTION PREP PVP 2OZ (MISCELLANEOUS) IMPLANT
SPONGE T-LAP 18X18 ~~LOC~~+RFID (SPONGE) IMPLANT
SUT MNCRL 4-0 27 PS-2 XMFL (SUTURE) ×1
SUT MNCRL 4-0 27XMFL (SUTURE) ×1
SUT VIC AB 3-0 SH 27X BRD (SUTURE) IMPLANT
SUTURE MNCRL 4-0 27XMF (SUTURE) IMPLANT
SWAB CULTURE AMIES ANAERIB BLU (MISCELLANEOUS) IMPLANT
SYR 10ML LL (SYRINGE) IMPLANT
TOWEL OR 17X26 4PK STRL BLUE (TOWEL DISPOSABLE) IMPLANT
TRAP FLUID SMOKE EVACUATOR (MISCELLANEOUS) ×2 IMPLANT
WATER STERILE IRR 500ML POUR (IV SOLUTION) ×2 IMPLANT

## 2023-06-07 NOTE — Op Note (Signed)
Pre-Op Dx: Right breast epidermal cyst Post-Op Dx: Same Anesthesia: LMA EBL: 10 mL Complications:  none apparent Specimen: Right breast epidermal cyst Procedure: excisional biopsy of right breast epidermal cyst Surgeon: Tonna Boehringer  Indications for procedure: See H&P  Description of Procedure:  Consent obtained, time out performed.  Patient placed in supine position.  Area sterilized and draped in usual position.  Local infused to area previously marked.  7 cm incision made through dermis around the healing epidermal cyst with 15blade  The  7 cm x 4 cm x 3.5 cm cyst then removed from surrounding tissue completely using electrocautery, passed off field pending pathology.  Wound hemostasis noted, then closed in two layer fashion with 3-0 vicryl in interrupted fashion for deep dermal layer, then running 4-0 monocryl in subcuticular fashion for epidermal layer.  Wound then dressed with dermabond.  Pt tolerated procedure well, and transferred to PACU in stable condition. Sponge and instrument count correct at end of procedure.

## 2023-06-07 NOTE — Interval H&P Note (Signed)
No change. OK to proceed.

## 2023-06-07 NOTE — Anesthesia Preprocedure Evaluation (Signed)
Anesthesia Evaluation  Patient identified by MRN, date of birth, ID band Patient awake    Reviewed: Allergy & Precautions, NPO status , Patient's Chart, lab work & pertinent test results  History of Anesthesia Complications (+) PONV, PROLONGED EMERGENCE and history of anesthetic complications  Airway Mallampati: III  TM Distance: >3 FB Neck ROM: full    Dental  (+) Poor Dentition, Dental Advidsory Given   Pulmonary shortness of breath and with exertion, asthma , sleep apnea , COPD, neg recent URI   Pulmonary exam normal        Cardiovascular hypertension, (-) angina + CAD and +CHF  (-) Past MI and (-) Cardiac Stents + dysrhythmias Atrial Fibrillation + Valvular Problems/Murmurs      Neuro/Psych neg Seizures PSYCHIATRIC DISORDERS  Depression    CVA, No Residual Symptoms    GI/Hepatic Neg liver ROS,GERD  ,,  Endo/Other  neg diabetesHypothyroidism    Renal/GU CRFRenal disease     Musculoskeletal   Abdominal   Peds  Hematology negative hematology ROS (+)   Anesthesia Other Findings Past Medical History: No date: Allergy No date: Anemia No date: Asthma No date: Atrial fibrillation [facility]) 2001: Cerebrovascular accident [facility])     Comment:  NO RESIDUAL EFFECTS No date: Chiari I malformation (HCC) No date: COPD (chronic obstructive pulmonary disease) (HCC) No date: Coronary artery disease No date: Depression No date: Dyspnea No date: Dysrhythmia     Comment:  atrial fib No date: GERD (gastroesophageal reflux disease) No date: Heart murmur No date: Hyperlipidemia No date: Hypertension No date: Hypoparathyroidism (HCC) No date: Hypothyroidism No date: Personality disorder, depressive No date: Pneumonia No date: PONV (postoperative nausea and vomiting)     Comment:  difficulty breathing during endoscopy, colonoscopy               performed prior w/out problems-NAUSEA ONLY No date: Pre-diabetes No date: Renal  insufficiency No date: Sleep apnea     Comment:  CPAP 1999: Thyroid cancer (HCC)     Comment:  Papillary No date: Thyroid disease     Comment:  hypothyroid   Past Surgical History: No date: CANNOT TOLERATE PAP / Virginal No date: CATARACT EXTRACTION, BILATERAL 10/24/2018: ELECTROMAGNETIC NAVIGATION BROCHOSCOPY; Left     Comment:  Procedure: ELECTROMAGNETIC NAVIGATION BRONCHOSCOPY LEFT;              Surgeon: Salena Saner, MD;  Location: [facility] ORS;                Service: Cardiopulmonary;  Laterality: Left; 11/18/2017: LEFT HEART CATH AND CORONARY ANGIOGRAPHY; Right     Comment:  Procedure: Left Heart Cath with possible coronary               intervention;  Surgeon: Laurier Nancy, MD;  Location:               [facility] INVASIVE CV LAB;  Service: Cardiovascular;                Laterality: Right; 12/1999: MRI brain No date: THYROIDECTOMY No date: TONSILLECTOMY 06/26/2016: TOTAL HIP ARTHROPLASTY; Right     Comment:  Procedure: TOTAL HIP ARTHROPLASTY ANTERIOR APPROACH;                Surgeon: Kennedy Bucker, MD;  Location: [facility] ORS;  Service:              Orthopedics;  Laterality: Right; 04/01/2020: VIDEO BRONCHOSCOPY WITH ENDOBRONCHIAL NAVIGATION; Left     Comment:  Procedure: VIDEO BRONCHOSCOPY WITH  ENDOBRONCHIAL               NAVIGATION;  Surgeon: Salena Saner, MD;  Location:               [facility] ORS;  Service: Pulmonary;  Laterality: Left;  BMI    Body Mass Index: 23.21 kg/m      Reproductive/Obstetrics negative OB ROS                             Anesthesia Physical Anesthesia Plan  ASA: 3  Anesthesia Plan: General   Post-op Pain Management:    Induction: Intravenous  PONV Risk Score and Plan: 4 or greater and Treatment may vary due to age or medical condition, Propofol infusion and TIVA  Airway Management Planned: Natural Airway and Simple Face Mask  Additional Equipment:   Intra-op Plan:   Post-operative Plan:   Informed Consent: I  have reviewed the patients History and Physical, chart, labs and discussed the procedure including the risks, benefits and alternatives for the proposed anesthesia with the patient or authorized representative who has indicated his/her understanding and acceptance.     Dental Advisory Given  Plan Discussed with: Anesthesiologist, CRNA and Surgeon  Anesthesia Plan Comments: (Patient consented for risks of anesthesia including but not limited to:  - adverse reactions to medications - damage to eyes, teeth, lips or other oral mucosa - nerve damage due to positioning  - sore throat or hoarseness - Damage to heart, brain, nerves, lungs, other parts of body or loss of life  Patient voiced understanding.)        Anesthesia Quick Evaluation

## 2023-06-07 NOTE — Transfer of Care (Signed)
Immediate Anesthesia Transfer of Care Note  Patient: Brenda Barber  Procedure(s) Performed: DEBRIDEMENT WOUND (Right: Breast)  Patient Location: PACU  Anesthesia Type:General  Level of Consciousness: awake, drowsy, and patient cooperative  Airway & Oxygen Therapy: Patient Spontanous Breathing and Patient connected to nasal cannula oxygen  Post-op Assessment: Report given to RN and Post -op Vital signs reviewed and stable  Post vital signs: Reviewed and stable  Last Vitals:  Vitals Value Taken Time  BP 101/64 06/07/23 0937  Temp 36.3 C 06/07/23 0937  Pulse 80 06/07/23 0938  Resp 33 06/07/23 0938  SpO2 97 % 06/07/23 0938  Vitals shown include unfiled device data.  Last Pain:  Vitals:   06/07/23 0807  TempSrc: Oral  PainSc: 0-No pain         Complications: No notable events documented.

## 2023-06-07 NOTE — Anesthesia Postprocedure Evaluation (Signed)
Anesthesia Post Note  Patient: Brenda Barber  Procedure(s) Performed: DEBRIDEMENT WOUND (Right: Breast)  Patient location during evaluation: PACU Anesthesia Type: General Level of consciousness: awake and alert Pain management: pain level controlled Vital Signs Assessment: post-procedure vital signs reviewed and stable Respiratory status: spontaneous breathing, nonlabored ventilation, respiratory function stable and patient connected to nasal cannula oxygen Cardiovascular status: blood pressure returned to baseline and stable Postop Assessment: no apparent nausea or vomiting Anesthetic complications: no   There were no known notable events for this encounter.   Last Vitals:  Vitals:   06/07/23 1015 06/07/23 1027  BP: 130/81 (P) 123/70  Pulse: 68 (P) 75  Resp: 16 (P) 16  Temp: 36.7 C (P) 36.4 C  SpO2: 96% (P) 95%    Last Pain:  Vitals:   06/07/23 1027  TempSrc: (P) Temporal  PainSc: (P) 0-No pain                 Lenard Simmer

## 2023-06-08 ENCOUNTER — Encounter: Payer: Self-pay | Admitting: Surgery

## 2023-06-10 LAB — SURGICAL PATHOLOGY

## 2023-06-14 DIAGNOSIS — K582 Mixed irritable bowel syndrome: Secondary | ICD-10-CM | POA: Diagnosis not present

## 2023-06-20 DIAGNOSIS — J4520 Mild intermittent asthma, uncomplicated: Secondary | ICD-10-CM | POA: Diagnosis not present

## 2023-06-20 DIAGNOSIS — R051 Acute cough: Secondary | ICD-10-CM | POA: Diagnosis not present

## 2023-06-20 DIAGNOSIS — R0602 Shortness of breath: Secondary | ICD-10-CM | POA: Diagnosis not present

## 2023-06-20 DIAGNOSIS — G4733 Obstructive sleep apnea (adult) (pediatric): Secondary | ICD-10-CM | POA: Diagnosis not present

## 2023-07-01 DIAGNOSIS — E1122 Type 2 diabetes mellitus with diabetic chronic kidney disease: Secondary | ICD-10-CM | POA: Diagnosis not present

## 2023-07-01 DIAGNOSIS — K625 Hemorrhage of anus and rectum: Secondary | ICD-10-CM | POA: Diagnosis not present

## 2023-07-01 DIAGNOSIS — Z860101 Personal history of adenomatous and serrated colon polyps: Secondary | ICD-10-CM | POA: Diagnosis not present

## 2023-07-01 DIAGNOSIS — R1310 Dysphagia, unspecified: Secondary | ICD-10-CM | POA: Diagnosis not present

## 2023-07-01 DIAGNOSIS — Z8719 Personal history of other diseases of the digestive system: Secondary | ICD-10-CM | POA: Diagnosis not present

## 2023-07-01 DIAGNOSIS — K5909 Other constipation: Secondary | ICD-10-CM | POA: Diagnosis not present

## 2023-07-05 DIAGNOSIS — D2271 Melanocytic nevi of right lower limb, including hip: Secondary | ICD-10-CM | POA: Diagnosis not present

## 2023-07-05 DIAGNOSIS — L218 Other seborrheic dermatitis: Secondary | ICD-10-CM | POA: Diagnosis not present

## 2023-07-05 DIAGNOSIS — D2272 Melanocytic nevi of left lower limb, including hip: Secondary | ICD-10-CM | POA: Diagnosis not present

## 2023-07-05 DIAGNOSIS — L821 Other seborrheic keratosis: Secondary | ICD-10-CM | POA: Diagnosis not present

## 2023-07-20 ENCOUNTER — Other Ambulatory Visit: Payer: Self-pay | Admitting: Cardiovascular Disease

## 2023-07-22 ENCOUNTER — Other Ambulatory Visit: Payer: Self-pay

## 2023-07-22 MED ORDER — APIXABAN 5 MG PO TABS: 5.0000 mg | ORAL_TABLET | Freq: Two times a day (BID) | ORAL | 0 refills | Status: DC

## 2023-07-24 ENCOUNTER — Other Ambulatory Visit: Payer: Self-pay | Admitting: Cardiovascular Disease

## 2023-07-30 ENCOUNTER — Ambulatory Visit (INDEPENDENT_AMBULATORY_CARE_PROVIDER_SITE_OTHER): Payer: Medicare HMO | Admitting: Cardiovascular Disease

## 2023-07-30 ENCOUNTER — Encounter: Payer: Self-pay | Admitting: Cardiovascular Disease

## 2023-07-30 VITALS — BP 119/78 | HR 85 | Ht 63.0 in | Wt 148.0 lb

## 2023-07-30 DIAGNOSIS — I351 Nonrheumatic aortic (valve) insufficiency: Secondary | ICD-10-CM

## 2023-07-30 DIAGNOSIS — I480 Paroxysmal atrial fibrillation: Secondary | ICD-10-CM

## 2023-07-30 DIAGNOSIS — I10 Essential (primary) hypertension: Secondary | ICD-10-CM

## 2023-07-30 DIAGNOSIS — I5033 Acute on chronic diastolic (congestive) heart failure: Secondary | ICD-10-CM

## 2023-07-30 DIAGNOSIS — I340 Nonrheumatic mitral (valve) insufficiency: Secondary | ICD-10-CM | POA: Diagnosis not present

## 2023-07-30 DIAGNOSIS — I2510 Atherosclerotic heart disease of native coronary artery without angina pectoris: Secondary | ICD-10-CM | POA: Diagnosis not present

## 2023-07-30 DIAGNOSIS — Z01810 Encounter for preprocedural cardiovascular examination: Secondary | ICD-10-CM

## 2023-07-30 DIAGNOSIS — G4733 Obstructive sleep apnea (adult) (pediatric): Secondary | ICD-10-CM | POA: Diagnosis not present

## 2023-07-30 NOTE — Progress Notes (Signed)
 Cardiology Office Note   Date:  07/30/2023   ID:  Brenda Barber, [place] [birth year], MRN [id number]  PCP:  Barbette Reichmann, MD  Cardiologist:  Adrian Blackwater, MD      History of Present Illness: Brenda Barber is a 68 y.o. female who presents for  Chief Complaint  Patient presents with   Follow-up    4 month follow up     Have endoscopy in April, and will advise holding elliquis 3 days prior to it.      Past Medical History:  Diagnosis Date   Abscess of right breast 05/2023   Allergy    Anemia    Aortic atherosclerosis (HCC)    Asthma    Carotid artery disease (HCC)    a.) carotid doppler 01/03/2017: 1-49% RICA   Chiari I malformation (HCC)    Chronic cerebral microvascular disease    COPD (chronic obstructive pulmonary disease) (HCC)    Coronary artery disease    DDD (degenerative disc disease), thoracolumbar    Depression    Diastolic dysfunction    a.) TTE 03/08/2020: EF 55-60%, mild LVH, G1DD, mod MAC, triv MR, AoV sclerosis without stenosis; b.) TTE 01/25/2023: EF 70.6%, mild LVH, G3DD, AoV calc/sclerosis without stenosis, triv AR/TR, mild MR   Dyspnea    Essential hypertension    GERD (gastroesophageal reflux disease)    Heart murmur    History of bilateral cataract extraction    History of left heart catheterization 11/18/2017   a.) LHC 11/18/2017: normal coronaries; no CAD   History of radiation therapy 2023   Hyperlipidemia    Hypoparathyroidism (HCC)    a.) s/p throidectomy   Lacunar infarction (HCC) 03/21/2000   a.) LEFT lentiform nucleus extending into the LEFT caudate nucleus; no residual deficits   Malignant neoplasm of lower lobe of left lung (HCC) 02/2022   a.) stage I NSCLC in [place]   Memory loss    a.) on acetylcholinesterase inhibitor (donepazil)   Obstructive sleep apnea on CPAP    On apixaban therapy    On dronedarone therapy    Osteopenia    Paroxysmal A-fib (HCC)    a.) CHA2DS2-VASc = 6 (age, sex, HTN, CVA x2, vascular disease  history) as of 06/06/2023; b.) cardiac rate/rhythm maintained on oral diltiazem + dronaderone; chronically anticoagulated using apixaban   Personality disorder, depressive    Pneumonia    PONV (postoperative nausea and vomiting)    difficulty breathing during endoscopy, colonoscopy performed prior w/out problems-NAUSEA ONLY   Postoperative hypothyroidism    a.) s/p thyroidectomy   Pre-diabetes    Stage 3a chronic kidney disease (CKD) (HCC)    Thoracic kyphosis    Thyroid cancer (HCC) 1999   a.) stage I follicular thyroid cancer (T3N0Mx)     Past Surgical History:  Procedure Laterality Date   CATARACT EXTRACTION W/ INTRAOCULAR LENS  IMPLANT, BILATERAL Bilateral    COLONOSCOPY WITH ESOPHAGOGASTRODUODENOSCOPY (EGD)  01/05/2013   ELECTROMAGNETIC NAVIGATION BROCHOSCOPY Left 10/24/2018   Procedure: ELECTROMAGNETIC NAVIGATION BRONCHOSCOPY LEFT;  Surgeon: Salena Saner, MD;  Location: [facility] ORS;  Service: Cardiopulmonary;  Laterality: Left;   LEFT HEART CATH AND CORONARY ANGIOGRAPHY Right 11/18/2017   Procedure: Left Heart Cath with possible coronary intervention;  Surgeon: Laurier Nancy, MD;  Location: [facility] INVASIVE CV LAB;  Service: Cardiovascular;  Laterality: Right;   TONSILLECTOMY     TOTAL HIP ARTHROPLASTY Right 06/26/2016   Procedure: TOTAL HIP ARTHROPLASTY ANTERIOR APPROACH;  Surgeon: Kennedy Bucker, MD;  Location:  [facility] ORS;  Service: Orthopedics;  Laterality: Right;   TOTAL THYROIDECTOMY  2000   VIDEO BRONCHOSCOPY WITH ENDOBRONCHIAL NAVIGATION Left 04/01/2020   Procedure: VIDEO BRONCHOSCOPY WITH ENDOBRONCHIAL NAVIGATION;  Surgeon: Salena Saner, MD;  Location: [facility] ORS;  Service: Pulmonary;  Laterality: Left;   VIDEO BRONCHOSCOPY WITH ENDOBRONCHIAL ULTRASOUND N/A 01/12/2022   Procedure: VIDEO BRONCHOSCOPY WITH ENDOBRONCHIAL ULTRASOUND;  Surgeon: Vida Rigger, MD;  Location: [facility] ORS;  Service: Thoracic;  Laterality: N/A;   WOUND DEBRIDEMENT Right 06/07/2023   Procedure:  DEBRIDEMENT WOUND;  Surgeon: Sung Amabile, DO;  Location: [facility] ORS;  Service: General;  Laterality: Right;     Current Outpatient Medications  Medication Sig Dispense Refill   acetaminophen (TYLENOL) 500 MG tablet Take 1,000 mg by mouth every 6 (six) hours as needed for mild pain.      apixaban (ELIQUIS) 5 MG TABS tablet Take 1 tablet (5 mg total) by mouth 2 (two) times daily. 180 tablet 0   atorvastatin (LIPITOR) 80 MG tablet Take 80 mg by mouth at bedtime.      calcium carbonate (OS-CAL - DOSED IN MG OF ELEMENTAL CALCIUM) 1250 (500 Ca) MG tablet Take 1 tablet by mouth 2 (two) times daily with a meal.     cyanocobalamin (VITAMIN B12) 1000 MCG tablet Take 1,000 mcg by mouth daily.     DILT-XR 120 MG 24 hr capsule TAKE 1 CAPSULE BY MOUTH EVERY DAY 90 capsule 0   donepezil (ARICEPT) 10 MG tablet Take 10 mg by mouth at bedtime.     dronedarone (MULTAQ) 400 MG tablet TAKE 1 TABLET BY MOUTH TWICE DAILY 60 tablet 0   FARXIGA 10 MG TABS tablet TAKE 1 TABLET(10 MG) BY MOUTH DAILY BEFORE BREAKFAST 30 tablet 3   fluticasone (FLONASE) 50 MCG/ACT nasal spray Place 2 sprays into both nostrils daily.     gemfibrozil (LOPID) 600 MG tablet TAKE 1 TABLET BY MOUTH TWICE DAILY 30 MINUTES BEFORE BREAKFAST AND DINNER 180 tablet 0   HYDROcodone-acetaminophen (NORCO) 5-325 MG tablet Take 1 tablet by mouth every 6 (six) hours as needed for up to 6 doses for moderate pain (pain score 4-6). 6 tablet 0   hydrocortisone 2.5 % lotion Apply 1 application  topically daily.     iron polysaccharides (NIFEREX) 150 MG capsule Take 150 mg by mouth daily.      isosorbide mononitrate (IMDUR) 30 MG 24 hr tablet TAKE 1 TABLET BY MOUTH EVERY DAY 90 tablet 0   levalbuterol (XOPENEX) 0.63 MG/3ML nebulizer solution Take 0.63 mg by nebulization every 6 (six) hours as needed for wheezing or shortness of breath.     levothyroxine (SYNTHROID) 112 MCG tablet Take 112 mcg by mouth daily before breakfast.     montelukast (SINGULAIR) 10 MG  tablet Take 10 mg by mouth at bedtime.     Multiple Vitamin (MULTIVITAMIN) tablet Take 1 tablet by mouth daily.     niacin (VITAMIN B3) 500 MG tablet Take 500 mg by mouth at bedtime.     nitroGLYCERIN (NITROSTAT) 0.4 MG SL tablet Place 0.4 mg under the tongue every 5 (five) minutes as needed for chest pain.      potassium chloride (KLOR-CON) 10 MEQ tablet Take 10 mEq by mouth daily.     RESTASIS 0.05 % ophthalmic emulsion Place 1 drop into both eyes 2 (two) times daily.     sucralfate (CARAFATE) 1 g tablet TAKE 1 TABLET BY MOUTH TWICE DAILY 1 HOUR BEFORE MEALS 180 tablet 0   TRELEGY ELLIPTA  100-62.5-25 MCG/ACT AEPB Inhale 1 puff into the lungs daily.     vitamin C (ASCORBIC ACID) 500 MG tablet Take 500 mg by mouth daily.     No current facility-administered medications for this visit.    Allergies:   Aspirin-dipyridamole er, Guaifenesin, and Rosuvastatin    Social History:   reports that she has never smoked. She has never used smokeless tobacco. She reports that she does not drink alcohol and does not use drugs.   Family History:  family history includes Breast cancer (age of onset: 34) in her sister; Diabetes in her father, paternal aunt, and paternal uncle.    ROS:     Review of Systems  Constitutional: Negative.   HENT: Negative.    Eyes: Negative.   Respiratory: Negative.    Gastrointestinal: Negative.   Genitourinary: Negative.   Musculoskeletal: Negative.   Skin: Negative.   Neurological: Negative.   Endo/Heme/Allergies: Negative.   Psychiatric/Behavioral: Negative.    All other systems reviewed and are negative.     All other systems are reviewed and negative.    PHYSICAL EXAM: VS:  BP 119/78   Pulse 85   Ht 5\' 3"  (1.6 m)   Wt 148 lb (67.1 kg)   SpO2 95%   BMI 26.22 kg/m  , BMI Body mass index is 26.22 kg/m. Last weight:  Wt Readings from Last 3 Encounters:  07/30/23 148 lb (67.1 kg)  06/07/23 152 lb 1.9 oz (69 kg)  06/03/23 151 lb (68.5 kg)      Physical Exam Constitutional:      Appearance: Normal appearance.  Cardiovascular:     Rate and Rhythm: Normal rate and regular rhythm.     Heart sounds: Normal heart sounds.  Pulmonary:     Effort: Pulmonary effort is normal.     Breath sounds: Normal breath sounds.  Musculoskeletal:     Right lower leg: No edema.     Left lower leg: No edema.  Neurological:     Mental Status: She is alert.       EKG:   Recent Labs: No results found for requested labs within last 365 days.    Lipid Panel    Component Value Date/Time   CHOL 250 (H) 01/11/2012 0723   TRIG 123 01/11/2012 0723   HDL 42 01/11/2012 0723   CHOLHDL 7 12/23/2009 1040   VLDL 25 01/11/2012 0723   LDLCALC 183 (H) 01/11/2012 0723   LDLDIRECT 101.1 12/23/2009 1040      Other studies Reviewed: Additional studies/ records that were reviewed today include:  Review of the above records demonstrates:      03/29/2020    4:35 PM  PAD Screen  Previous PAD dx? No  Previous surgical procedure? No  Pain with walking? No  Feet/toe relief with dangling? No  Painful, non-healing ulcers? No  Extremities discolored? No      ASSESSMENT AND PLAN:    ICD-10-CM   1. Preop cardiovascular exam  Z01.810    Low risk for endoascopy, hold coumadin 3 days prior and restart elliquis next day.    2. Paroxysmal A-fib (HCC)  I48.0     3. Primary hypertension  I10     4. Sleep apnea, obstructive  G47.33     5. CHF (congestive heart failure), NYHA class I, acute on chronic, diastolic (HCC)  I50.33     6. Nonrheumatic mitral valve regurgitation  I34.0     7. Nonrheumatic aortic valve insufficiency  I35.1  8. Coronary artery disease involving native coronary artery of native heart without angina pectoris  I25.10        Problem List Items Addressed This Visit       Cardiovascular and Mediastinum   Paroxysmal A-fib (HCC)   Hypertension     Respiratory   Sleep apnea, obstructive   Other Visit Diagnoses        Preop cardiovascular exam    -  Primary   Low risk for endoascopy, hold coumadin 3 days prior and restart elliquis next day.     CHF (congestive heart failure), NYHA class I, acute on chronic, diastolic (HCC)         Nonrheumatic mitral valve regurgitation         Nonrheumatic aortic valve insufficiency         Coronary artery disease involving native coronary artery of native heart without angina pectoris              Disposition:   Return in about 3 months (around 10/30/2023).    Total time spent: 30 minutes  Signed,  Adrian Blackwater, MD  07/30/2023 1:23 PM    [facility]

## 2023-08-01 DIAGNOSIS — R058 Other specified cough: Secondary | ICD-10-CM | POA: Diagnosis not present

## 2023-08-01 DIAGNOSIS — G4733 Obstructive sleep apnea (adult) (pediatric): Secondary | ICD-10-CM | POA: Diagnosis not present

## 2023-08-01 DIAGNOSIS — C3432 Malignant neoplasm of lower lobe, left bronchus or lung: Secondary | ICD-10-CM | POA: Diagnosis not present

## 2023-08-06 DIAGNOSIS — E890 Postprocedural hypothyroidism: Secondary | ICD-10-CM | POA: Diagnosis not present

## 2023-08-06 DIAGNOSIS — N1832 Chronic kidney disease, stage 3b: Secondary | ICD-10-CM | POA: Diagnosis not present

## 2023-08-13 ENCOUNTER — Encounter: Payer: Self-pay | Admitting: Gastroenterology

## 2023-08-13 DIAGNOSIS — M8580 Other specified disorders of bone density and structure, unspecified site: Secondary | ICD-10-CM | POA: Diagnosis not present

## 2023-08-13 DIAGNOSIS — E892 Postprocedural hypoparathyroidism: Secondary | ICD-10-CM | POA: Diagnosis not present

## 2023-08-13 DIAGNOSIS — Z85850 Personal history of malignant neoplasm of thyroid: Secondary | ICD-10-CM | POA: Diagnosis not present

## 2023-08-13 DIAGNOSIS — N1832 Chronic kidney disease, stage 3b: Secondary | ICD-10-CM | POA: Diagnosis not present

## 2023-08-13 DIAGNOSIS — E890 Postprocedural hypothyroidism: Secondary | ICD-10-CM | POA: Diagnosis not present

## 2023-08-16 DIAGNOSIS — B9689 Other specified bacterial agents as the cause of diseases classified elsewhere: Secondary | ICD-10-CM | POA: Diagnosis not present

## 2023-08-16 DIAGNOSIS — J209 Acute bronchitis, unspecified: Secondary | ICD-10-CM | POA: Diagnosis not present

## 2023-08-16 DIAGNOSIS — J019 Acute sinusitis, unspecified: Secondary | ICD-10-CM | POA: Diagnosis not present

## 2023-08-16 DIAGNOSIS — J441 Chronic obstructive pulmonary disease with (acute) exacerbation: Secondary | ICD-10-CM | POA: Diagnosis not present

## 2023-08-22 ENCOUNTER — Encounter: Payer: Self-pay | Admitting: Anesthesiology

## 2023-08-22 ENCOUNTER — Encounter
Admission: RE | Disposition: A | Discharge status: Home / Self Care | Payer: Self-pay | Source: Home / Self Care | Attending: Gastroenterology

## 2023-08-22 ENCOUNTER — Encounter: Payer: Self-pay | Admitting: Gastroenterology

## 2023-08-22 ENCOUNTER — Other Ambulatory Visit: Payer: Self-pay

## 2023-08-22 ENCOUNTER — Ambulatory Visit
Admission: RE | Admit: 2023-08-22 | Discharge: 2023-08-22 | Disposition: A | Discharge status: Home / Self Care | Payer: Medicare HMO | Attending: Gastroenterology | Admitting: Gastroenterology

## 2023-08-22 DIAGNOSIS — Z5309 Procedure and treatment not carried out because of other contraindication: Secondary | ICD-10-CM | POA: Diagnosis not present

## 2023-08-22 DIAGNOSIS — K5909 Other constipation: Secondary | ICD-10-CM | POA: Insufficient documentation

## 2023-08-22 DIAGNOSIS — J40 Bronchitis, not specified as acute or chronic: Secondary | ICD-10-CM | POA: Insufficient documentation

## 2023-08-22 DIAGNOSIS — Z860101 Personal history of adenomatous and serrated colon polyps: Secondary | ICD-10-CM | POA: Diagnosis not present

## 2023-08-22 DIAGNOSIS — K625 Hemorrhage of anus and rectum: Secondary | ICD-10-CM | POA: Diagnosis not present

## 2023-08-22 DIAGNOSIS — R1319 Other dysphagia: Secondary | ICD-10-CM | POA: Diagnosis not present

## 2023-08-22 HISTORY — DX: Cerebral infarction, unspecified: I63.9

## 2023-08-22 HISTORY — PX: COLONOSCOPY WITH PROPOFOL: SHX5780

## 2023-08-22 HISTORY — PX: ESOPHAGOGASTRODUODENOSCOPY (EGD) WITH PROPOFOL: SHX5813

## 2023-08-22 SURGERY — COLONOSCOPY WITH PROPOFOL
Anesthesia: General

## 2023-08-22 NOTE — Progress Notes (Signed)
 Patient cancelled per Dr. Timothy Lasso due to patient having Bronchitis and on antibiotics

## 2023-08-22 NOTE — Anesthesia Preprocedure Evaluation (Signed)
 Anesthesia Evaluation  Patient identified by MRN, date of birth, ID band Patient awake    Reviewed: Allergy & Precautions, NPO status , Patient's Chart, lab work & pertinent test results  History of Anesthesia Complications (+) PONV and history of anesthetic complications  Airway Mallampati: III  TM Distance: >3 FB Neck ROM: full    Dental no notable dental hx.    Pulmonary shortness of breath, asthma , sleep apnea , COPD,  COPD inhaler   Pulmonary exam normal        Cardiovascular hypertension, On Medications (-) angina + CAD  + dysrhythmias Atrial Fibrillation + Valvular Problems/Murmurs   Echo 01/2023  ASSESSMENT  Technically adequate study..  Normal left ventricular systolic function.  Mild left ventricular hypertrophy with GRADE  3 (restrictive physiology)  diastolic dysfunction.  Normal right ventricular systolic function.  Normal right ventricular diastolic function.  Normal left ventricular wall motion.  Normal right ventricular wall motion.  Trace tricuspid regurgitation.  Normal pulmonary artery pressure.  Mild mitral regurgitation.  Fibrocalcified aortic valve with trace aortic regurgitation.  No pericardial effusion.  Mildly dilated left atrium  Mild left ventricle hypertrophy     Neuro/Psych  Headaches PSYCHIATRIC DISORDERS  Depression     Neuromuscular disease CVA, No Residual Symptoms    GI/Hepatic Neg liver ROS,GERD  ,,  Endo/Other  Hypothyroidism  Hypoparathyroidism   Renal/GU Renal disease  negative genitourinary   Musculoskeletal   Abdominal   Peds  Hematology  (+) Blood dyscrasia, anemia   Anesthesia Other Findings Past Medical History: 05/2023: Abscess of right breast No date: Allergy No date: Anemia No date: Aortic atherosclerosis (HCC) No date: Asthma No date: Carotid artery disease (HCC)     Comment:  a.) carotid doppler 01/03/2017: 1-49% RICA No date: Chiari I malformation  (HCC) No date: Chronic cerebral microvascular disease No date: COPD (chronic obstructive pulmonary disease) (HCC) No date: Coronary artery disease No date: DDD (degenerative disc disease), thoracolumbar No date: Depression No date: Diastolic dysfunction     Comment:  a.) TTE 03/08/2020: EF 55-60%, mild LVH, G1DD, mod MAC,               triv MR, AoV sclerosis without stenosis; b.) TTE               01/25/2023: EF 70.6%, mild LVH, G3DD, AoV calc/sclerosis               without stenosis, triv AR/TR, mild MR No date: Dyspnea No date: Essential hypertension No date: GERD (gastroesophageal reflux disease) No date: Heart murmur No date: History of bilateral cataract extraction 11/18/2017: History of left heart catheterization     Comment:  a.) LHC 11/18/2017: normal coronaries; no CAD 2023: History of radiation therapy No date: Hyperlipidemia No date: Hypoparathyroidism (HCC)     Comment:  a.) s/p throidectomy 03/21/2000: Lacunar infarction (HCC)     Comment:  a.) LEFT lentiform nucleus extending into the LEFT               caudate nucleus; no residual deficits 02/2022: Malignant neoplasm of lower lobe of left lung (HCC)     Comment:  a.) stage I NSCLC in [place]LL No date: Memory loss     Comment:  a.) on acetylcholinesterase inhibitor (donepazil) No date: Obstructive sleep apnea on CPAP No date: On apixaban therapy No date: On dronedarone therapy No date: Osteopenia No date: Paroxysmal A-fib (HCC)     Comment:  a.) CHA2DS2-VASc = 6 (age, sex, HTN, CVA x2,  vascular               disease history) as of 06/06/2023; b.) cardiac               rate/rhythm maintained on oral diltiazem + dronaderone;               chronically anticoagulated using apixaban No date: Personality disorder, depressive No date: Pneumonia No date: PONV (postoperative nausea and vomiting)     Comment:  difficulty breathing during endoscopy, colonoscopy               performed prior w/out problems-NAUSEA ONLY No date:  Postoperative hypothyroidism     Comment:  a.) s/p thyroidectomy No date: Pre-diabetes No date: Stage 3a chronic kidney disease (CKD) (HCC) No date: Stroke [facility]) No date: Thoracic kyphosis 1999: Thyroid cancer (HCC)     Comment:  a.) stage I follicular thyroid cancer (T3N0Mx)  Past Surgical History: No date: CATARACT EXTRACTION W/ INTRAOCULAR LENS  IMPLANT, BILATERAL;  Bilateral 01/05/2013: COLONOSCOPY WITH ESOPHAGOGASTRODUODENOSCOPY (EGD) 10/24/2018: ELECTROMAGNETIC NAVIGATION BROCHOSCOPY; Left     Comment:  Procedure: ELECTROMAGNETIC NAVIGATION BRONCHOSCOPY LEFT;              Surgeon: Salena Saner, MD;  Location: [facility] ORS;                Service: Cardiopulmonary;  Laterality: Left; No date: JOINT REPLACEMENT 11/18/2017: LEFT HEART CATH AND CORONARY ANGIOGRAPHY; Right     Comment:  Procedure: Left Heart Cath with possible coronary               intervention;  Surgeon: Laurier Nancy, MD;  Location:               [facility] INVASIVE CV LAB;  Service: Cardiovascular;                Laterality: Right; No date: TONSILLECTOMY 06/26/2016: TOTAL HIP ARTHROPLASTY; Right     Comment:  Procedure: TOTAL HIP ARTHROPLASTY ANTERIOR APPROACH;                Surgeon: Kennedy Bucker, MD;  Location: [facility] ORS;  Service:              Orthopedics;  Laterality: Right; 2000: TOTAL THYROIDECTOMY 04/01/2020: VIDEO BRONCHOSCOPY WITH ENDOBRONCHIAL NAVIGATION; Left     Comment:  Procedure: VIDEO BRONCHOSCOPY WITH ENDOBRONCHIAL               NAVIGATION;  Surgeon: Salena Saner, MD;  Location:               [facility] ORS;  Service: Pulmonary;  Laterality: Left; 01/12/2022: VIDEO BRONCHOSCOPY WITH ENDOBRONCHIAL ULTRASOUND; N/A     Comment:  Procedure: VIDEO BRONCHOSCOPY WITH ENDOBRONCHIAL               ULTRASOUND;  Surgeon: Vida Rigger, MD;  Location:               [facility] ORS;  Service: Thoracic;  Laterality: N/A; 06/07/2023: WOUND DEBRIDEMENT; Right     Comment:  Procedure: DEBRIDEMENT WOUND;  Surgeon:  Sung Amabile,               DO;  Location: [facility] ORS;  Service: General;  Laterality:               Right;     Reproductive/Obstetrics negative OB ROS  Anesthesia Physical Anesthesia Plan  ASA: 3  Anesthesia Plan: General   Post-op Pain Management: Minimal or no pain anticipated   Induction: Intravenous  PONV Risk Score and Plan: 3 and Propofol infusion and TIVA  Airway Management Planned: Natural Airway and Nasal Cannula  Additional Equipment:   Intra-op Plan:   Post-operative Plan:   Informed Consent: I have reviewed the patients History and Physical, chart, labs and discussed the procedure including the risks, benefits and alternatives for the proposed anesthesia with the patient or authorized representative who has indicated his/her understanding and acceptance.     Dental Advisory Given  Plan Discussed with: Anesthesiologist, CRNA and Surgeon  Anesthesia Plan Comments: (Patient consented for risks of anesthesia including but not limited to:  - adverse reactions to medications - risk of airway placement if required - damage to eyes, teeth, lips or other oral mucosa - nerve damage due to positioning  - sore throat or hoarseness - Damage to heart, brain, nerves, lungs, other parts of body or loss of life  Patient voiced understanding and assent.)         Anesthesia Quick Evaluation

## 2023-08-22 NOTE — H&P (Signed)
 Brief GI Note Pt recently diagnosed with bronchitis and being treated. H/o lung ca, COPD, asthma Actively wheezing in pre-op.  Have discussed with patient and in interest of safety, will post pone procedure until recovered from bronchitis / finished treatment.  She verbalized understanding and was in agreement. Our office will reach out to the patient to reschedule.  Enis Slipper, DO [facility]

## 2023-08-23 ENCOUNTER — Encounter: Payer: Self-pay | Admitting: Gastroenterology

## 2023-08-28 ENCOUNTER — Ambulatory Visit
Admission: RE | Admit: 2023-08-28 | Discharge: 2023-08-28 | Disposition: A | Discharge status: Home / Self Care | Payer: Medicare HMO | Source: Ambulatory Visit | Attending: Radiation Oncology | Admitting: Radiation Oncology

## 2023-08-28 DIAGNOSIS — C3432 Malignant neoplasm of lower lobe, left bronchus or lung: Secondary | ICD-10-CM | POA: Insufficient documentation

## 2023-08-28 DIAGNOSIS — J40 Bronchitis, not specified as acute or chronic: Secondary | ICD-10-CM | POA: Diagnosis not present

## 2023-08-28 DIAGNOSIS — I700 Atherosclerosis of aorta: Secondary | ICD-10-CM | POA: Diagnosis not present

## 2023-09-02 DIAGNOSIS — G479 Sleep disorder, unspecified: Secondary | ICD-10-CM | POA: Diagnosis not present

## 2023-09-02 DIAGNOSIS — R2689 Other abnormalities of gait and mobility: Secondary | ICD-10-CM | POA: Diagnosis not present

## 2023-09-02 DIAGNOSIS — R413 Other amnesia: Secondary | ICD-10-CM | POA: Diagnosis not present

## 2023-09-04 ENCOUNTER — Telehealth: Payer: Self-pay | Admitting: *Deleted

## 2023-09-05 DIAGNOSIS — I639 Cerebral infarction, unspecified: Secondary | ICD-10-CM | POA: Diagnosis not present

## 2023-09-05 DIAGNOSIS — R809 Proteinuria, unspecified: Secondary | ICD-10-CM | POA: Diagnosis not present

## 2023-09-05 DIAGNOSIS — G4733 Obstructive sleep apnea (adult) (pediatric): Secondary | ICD-10-CM | POA: Diagnosis not present

## 2023-09-05 DIAGNOSIS — E785 Hyperlipidemia, unspecified: Secondary | ICD-10-CM | POA: Diagnosis not present

## 2023-09-05 DIAGNOSIS — D631 Anemia in chronic kidney disease: Secondary | ICD-10-CM | POA: Diagnosis not present

## 2023-09-05 DIAGNOSIS — I10 Essential (primary) hypertension: Secondary | ICD-10-CM | POA: Diagnosis not present

## 2023-09-05 DIAGNOSIS — N1832 Chronic kidney disease, stage 3b: Secondary | ICD-10-CM | POA: Diagnosis not present

## 2023-09-11 ENCOUNTER — Ambulatory Visit
Admission: RE | Admit: 2023-09-11 | Discharge: 2023-09-11 | Disposition: A | Discharge status: Home / Self Care | Payer: Medicare HMO | Source: Ambulatory Visit | Attending: Radiation Oncology | Admitting: Radiation Oncology

## 2023-09-11 ENCOUNTER — Encounter: Payer: Self-pay | Admitting: Radiation Oncology

## 2023-09-11 VITALS — BP 134/78 | HR 89 | Temp 98.3°F | Resp 20 | Wt 150.0 lb

## 2023-09-11 DIAGNOSIS — C3432 Malignant neoplasm of lower lobe, left bronchus or lung: Secondary | ICD-10-CM | POA: Insufficient documentation

## 2023-09-11 DIAGNOSIS — R918 Other nonspecific abnormal finding of lung field: Secondary | ICD-10-CM | POA: Insufficient documentation

## 2023-09-11 DIAGNOSIS — Z923 Personal history of irradiation: Secondary | ICD-10-CM | POA: Diagnosis not present

## 2023-09-11 NOTE — Progress Notes (Signed)
 Radiation Oncology Follow up Note  Name: Brenda Barber   Date:   09/11/2023 MRN:  [id number] DOB: Apr 15, 1956    This 68 y.o. female presents to the clinic today for 12-month follow-up status post SBRT to her left lower lobe for stage I non-small cell lung cancer.  REFERRING PROVIDER: Antonio Baumgarten, MD  HPI: Patient is a 68 year old female now out 16 months having a pleated SBRT to her left lower lobe for stage I non-small cell lung cancer seen today in routine follow-up she is doing well she is having no pulmonary issues specifically no cough hemoptysis chest tightness or any change in her pulmonary status.  She had a recent CT scan which I reviewed.  Showing stable nodule in the left lower lobe measuring 1.1 cm.  She does have multifocal subsolid nodules within both lungs measuring up to 1.4 cm which are stable over time.  COMPLICATIONS OF TREATMENT: none  FOLLOW UP COMPLIANCE: keeps appointments   PHYSICAL EXAM:  BP 134/78   Pulse 89   Temp 98.3 F (36.8 C) (Tympanic)   Resp 20   Wt 150 lb (68 kg)   BMI 26.57 kg/m  Well-developed well-nourished patient in NAD. HEENT reveals PERLA, EOMI, discs not visualized.  Oral cavity is clear. No oral mucosal lesions are identified. Neck is clear without evidence of cervical or supraclavicular adenopathy. Lungs are clear to A&P. Cardiac examination is essentially unremarkable with regular rate and rhythm without murmur rub or thrill. Abdomen is benign with no organomegaly or masses noted. Motor sensory and DTR levels are equal and symmetric in the upper and lower extremities. Cranial nerves II through XII are grossly intact. Proprioception is intact. No peripheral adenopathy or edema is identified. No motor or sensory levels are noted. Crude visual fields are within normal range.  RADIOLOGY RESULTS: CT scan reviewed compatible with above-stated findings  PLAN: At the present time patient is doing well very low side effect profile from SBRT.  We  will keep track of her multiple lung nodules with repeat CT scan and follow-up in 6 months.  Patient meantime knows to call with any concerns.  I would like to take this opportunity to thank you for allowing me to participate in the care of your patient.Glenis Langdon, MD

## 2023-09-12 DIAGNOSIS — E785 Hyperlipidemia, unspecified: Secondary | ICD-10-CM | POA: Diagnosis not present

## 2023-09-12 DIAGNOSIS — E039 Hypothyroidism, unspecified: Secondary | ICD-10-CM | POA: Diagnosis not present

## 2023-09-12 DIAGNOSIS — R809 Proteinuria, unspecified: Secondary | ICD-10-CM | POA: Diagnosis not present

## 2023-09-12 DIAGNOSIS — E890 Postprocedural hypothyroidism: Secondary | ICD-10-CM | POA: Diagnosis not present

## 2023-09-12 DIAGNOSIS — C3432 Malignant neoplasm of lower lobe, left bronchus or lung: Secondary | ICD-10-CM | POA: Diagnosis not present

## 2023-09-12 DIAGNOSIS — G935 Compression of brain: Secondary | ICD-10-CM | POA: Diagnosis not present

## 2023-09-12 DIAGNOSIS — I480 Paroxysmal atrial fibrillation: Secondary | ICD-10-CM | POA: Diagnosis not present

## 2023-09-12 DIAGNOSIS — J4520 Mild intermittent asthma, uncomplicated: Secondary | ICD-10-CM | POA: Diagnosis not present

## 2023-09-12 DIAGNOSIS — R7309 Other abnormal glucose: Secondary | ICD-10-CM | POA: Diagnosis not present

## 2023-09-12 DIAGNOSIS — N611 Abscess of the breast and nipple: Secondary | ICD-10-CM | POA: Diagnosis not present

## 2023-09-12 DIAGNOSIS — I10 Essential (primary) hypertension: Secondary | ICD-10-CM | POA: Diagnosis not present

## 2023-09-12 DIAGNOSIS — G4733 Obstructive sleep apnea (adult) (pediatric): Secondary | ICD-10-CM | POA: Diagnosis not present

## 2023-09-12 DIAGNOSIS — D631 Anemia in chronic kidney disease: Secondary | ICD-10-CM | POA: Diagnosis not present

## 2023-09-12 DIAGNOSIS — N1832 Chronic kidney disease, stage 3b: Secondary | ICD-10-CM | POA: Diagnosis not present

## 2023-09-12 DIAGNOSIS — I639 Cerebral infarction, unspecified: Secondary | ICD-10-CM | POA: Diagnosis not present

## 2023-09-18 ENCOUNTER — Other Ambulatory Visit: Payer: Self-pay | Admitting: Internal Medicine

## 2023-09-18 DIAGNOSIS — Z1331 Encounter for screening for depression: Secondary | ICD-10-CM | POA: Diagnosis not present

## 2023-09-18 DIAGNOSIS — R918 Other nonspecific abnormal finding of lung field: Secondary | ICD-10-CM | POA: Diagnosis not present

## 2023-09-18 DIAGNOSIS — Z1231 Encounter for screening mammogram for malignant neoplasm of breast: Secondary | ICD-10-CM

## 2023-09-18 DIAGNOSIS — I480 Paroxysmal atrial fibrillation: Secondary | ICD-10-CM | POA: Diagnosis not present

## 2023-09-18 DIAGNOSIS — G4733 Obstructive sleep apnea (adult) (pediatric): Secondary | ICD-10-CM | POA: Diagnosis not present

## 2023-09-18 DIAGNOSIS — N1831 Chronic kidney disease, stage 3a: Secondary | ICD-10-CM | POA: Diagnosis not present

## 2023-09-18 DIAGNOSIS — J45909 Unspecified asthma, uncomplicated: Secondary | ICD-10-CM | POA: Diagnosis not present

## 2023-09-18 DIAGNOSIS — E890 Postprocedural hypothyroidism: Secondary | ICD-10-CM | POA: Diagnosis not present

## 2023-09-18 DIAGNOSIS — Z0000 Encounter for general adult medical examination without abnormal findings: Secondary | ICD-10-CM | POA: Diagnosis not present

## 2023-09-18 DIAGNOSIS — G935 Compression of brain: Secondary | ICD-10-CM | POA: Diagnosis not present

## 2023-10-01 ENCOUNTER — Other Ambulatory Visit: Payer: Self-pay | Admitting: Cardiovascular Disease

## 2023-10-01 DIAGNOSIS — G4733 Obstructive sleep apnea (adult) (pediatric): Secondary | ICD-10-CM | POA: Diagnosis not present

## 2023-10-01 DIAGNOSIS — Z923 Personal history of irradiation: Secondary | ICD-10-CM | POA: Diagnosis not present

## 2023-10-01 DIAGNOSIS — I700 Atherosclerosis of aorta: Secondary | ICD-10-CM

## 2023-10-01 DIAGNOSIS — I2510 Atherosclerotic heart disease of native coronary artery without angina pectoris: Secondary | ICD-10-CM

## 2023-10-01 DIAGNOSIS — C3432 Malignant neoplasm of lower lobe, left bronchus or lung: Secondary | ICD-10-CM | POA: Diagnosis not present

## 2023-10-01 DIAGNOSIS — I351 Nonrheumatic aortic (valve) insufficiency: Secondary | ICD-10-CM

## 2023-10-01 DIAGNOSIS — I10 Essential (primary) hypertension: Secondary | ICD-10-CM

## 2023-10-01 DIAGNOSIS — R053 Chronic cough: Secondary | ICD-10-CM | POA: Diagnosis not present

## 2023-10-01 DIAGNOSIS — J4520 Mild intermittent asthma, uncomplicated: Secondary | ICD-10-CM | POA: Diagnosis not present

## 2023-10-01 DIAGNOSIS — I480 Paroxysmal atrial fibrillation: Secondary | ICD-10-CM

## 2023-10-01 DIAGNOSIS — N1832 Chronic kidney disease, stage 3b: Secondary | ICD-10-CM

## 2023-10-01 DIAGNOSIS — I340 Nonrheumatic mitral (valve) insufficiency: Secondary | ICD-10-CM

## 2023-10-16 ENCOUNTER — Other Ambulatory Visit: Payer: Self-pay

## 2023-10-28 MED ORDER — ISOSORBIDE MONONITRATE ER 30 MG PO TB24: 30.0000 mg | ORAL_TABLET | Freq: Every day | ORAL | 0 refills | Status: DC

## 2023-10-31 ENCOUNTER — Ambulatory Visit (INDEPENDENT_AMBULATORY_CARE_PROVIDER_SITE_OTHER): Admitting: Cardiovascular Disease

## 2023-10-31 ENCOUNTER — Encounter: Payer: Self-pay | Admitting: Cardiovascular Disease

## 2023-10-31 VITALS — BP 122/62 | HR 98 | Ht 63.0 in | Wt 149.4 lb

## 2023-10-31 DIAGNOSIS — I2510 Atherosclerotic heart disease of native coronary artery without angina pectoris: Secondary | ICD-10-CM | POA: Diagnosis not present

## 2023-10-31 DIAGNOSIS — I351 Nonrheumatic aortic (valve) insufficiency: Secondary | ICD-10-CM | POA: Diagnosis not present

## 2023-10-31 DIAGNOSIS — G4733 Obstructive sleep apnea (adult) (pediatric): Secondary | ICD-10-CM

## 2023-10-31 DIAGNOSIS — I340 Nonrheumatic mitral (valve) insufficiency: Secondary | ICD-10-CM

## 2023-10-31 DIAGNOSIS — I5033 Acute on chronic diastolic (congestive) heart failure: Secondary | ICD-10-CM

## 2023-10-31 DIAGNOSIS — I480 Paroxysmal atrial fibrillation: Secondary | ICD-10-CM

## 2023-10-31 DIAGNOSIS — Z0130 Encounter for examination of blood pressure without abnormal findings: Secondary | ICD-10-CM

## 2023-10-31 MED ORDER — FUROSEMIDE 20 MG PO TABS: 20.0000 mg | ORAL_TABLET | Freq: Every day | ORAL | 11 refills | Status: AC

## 2023-10-31 MED ORDER — ISOSORBIDE MONONITRATE ER 30 MG PO TB24
30.0000 mg | ORAL_TABLET | Freq: Every day | ORAL | 0 refills | Status: DC
Start: 2023-10-31 — End: 2024-04-13

## 2023-10-31 NOTE — Addendum Note (Signed)
 Addended by: Debborah Fairly A on: 10/31/2023 01:46 PM   Modules accepted: Orders

## 2023-10-31 NOTE — Progress Notes (Addendum)
 Cardiology Office Note   Date:  10/31/2023   ID:  Brenda Barber [place], [place] 1956-04-16, MRN [id number]  PCP:  Brenda Baumgarten, MD  Cardiologist:  Brenda Fairly, MD      History of Present Illness: Brenda Barber is a 68 y.o. female who presents for  Chief Complaint  Patient presents with  . Follow-up    3 month follow up    Doing well, no SOB. GOING to see dermatology as has rash     Past Medical History:  Diagnosis Date  . Abscess of right breast 05/2023  . Allergy   . Anemia   . Aortic atherosclerosis (HCC)   . Asthma   . Carotid artery disease (HCC)    a.) carotid doppler 01/03/2017: 1-49% RICA  . Chiari I malformation (HCC)   . Chronic cerebral microvascular disease   . COPD (chronic obstructive pulmonary disease) (HCC)   . Coronary artery disease   . DDD (degenerative disc disease), thoracolumbar   . Depression   . Diastolic dysfunction    a.) TTE 03/08/2020: EF 55-60%, mild LVH, G1DD, mod MAC, triv MR, AoV sclerosis without stenosis; b.) TTE 01/25/2023: EF 70.6%, mild LVH, G3DD, AoV calc/sclerosis without stenosis, triv AR/TR, mild MR  . Dyspnea   . Essential hypertension   . GERD (gastroesophageal reflux disease)   . Heart murmur   . History of bilateral cataract extraction   . History of left heart catheterization 11/18/2017   a.) LHC 11/18/2017: normal coronaries; no CAD  . History of radiation therapy 2023  . Hyperlipidemia   . Hypoparathyroidism (HCC)    a.) s/p throidectomy  . Lacunar infarction (HCC) 03/21/2000   a.) LEFT lentiform nucleus extending into the LEFT caudate nucleus; no residual deficits  . Malignant neoplasm of lower lobe of left lung (HCC) 02/2022   a.) stage I NSCLC in [facility]  . Memory loss    a.) on acetylcholinesterase inhibitor (donepazil)  . Obstructive sleep apnea on CPAP   . On apixaban  therapy   . On dronedarone  therapy   . Osteopenia   . Paroxysmal A-fib (HCC)    a.) CHA2DS2-VASc = 6 (age, sex, HTN, CVA x2, vascular  disease history) as of 06/06/2023; b.) cardiac rate/rhythm maintained on oral diltiazem  + dronaderone; chronically anticoagulated using apixaban   . Personality disorder, depressive   . Pneumonia   . PONV (postoperative nausea and vomiting)    difficulty breathing during endoscopy, colonoscopy performed prior w/out problems-NAUSEA ONLY  . Postoperative hypothyroidism    a.) s/p thyroidectomy  . Pre-diabetes   . Stage 3a chronic kidney disease (CKD) (HCC)   . Stroke (HCC)   . Thoracic kyphosis   . Thyroid  cancer (HCC) 1999   a.) stage I follicular thyroid  cancer (T3N0Mx)     Past Surgical History:  Procedure Laterality Date  . CATARACT EXTRACTION W/ INTRAOCULAR LENS  IMPLANT, BILATERAL Bilateral   . COLONOSCOPY WITH ESOPHAGOGASTRODUODENOSCOPY (EGD)  01/05/2013  . COLONOSCOPY WITH PROPOFOL  N/A 08/22/2023   Procedure: COLONOSCOPY WITH PROPOFOL ;  Surgeon: Quintin Buckle, DO;  Location: [facility] ENDOSCOPY;  Service: Gastroenterology;  Laterality: N/A;  DM  . ELECTROMAGNETIC NAVIGATION BROCHOSCOPY Left 10/24/2018   Procedure: ELECTROMAGNETIC NAVIGATION BRONCHOSCOPY LEFT;  Surgeon: Marc Senior, MD;  Location: [facility] ORS;  Service: Cardiopulmonary;  Laterality: Left;  . ESOPHAGOGASTRODUODENOSCOPY (EGD) WITH PROPOFOL  N/A 08/22/2023   Procedure: ESOPHAGOGASTRODUODENOSCOPY (EGD) WITH PROPOFOL ;  Surgeon: Quintin Buckle, DO;  Location: [facility] ENDOSCOPY;  Service: Gastroenterology;  Laterality: N/A;  . JOINT  REPLACEMENT    . LEFT HEART CATH AND CORONARY ANGIOGRAPHY Right 11/18/2017   Procedure: Left Heart Cath with possible coronary intervention;  Surgeon: Cherrie Cornwall, MD;  Location: [facility] INVASIVE CV LAB;  Service: Cardiovascular;  Laterality: Right;  . TONSILLECTOMY    . TOTAL HIP ARTHROPLASTY Right 06/26/2016   Procedure: TOTAL HIP ARTHROPLASTY ANTERIOR APPROACH;  Surgeon: Molli Angelucci, MD;  Location: [facility] ORS;  Service: Orthopedics;  Laterality: Right;  . TOTAL THYROIDECTOMY  2000  .  VIDEO BRONCHOSCOPY WITH ENDOBRONCHIAL NAVIGATION Left 04/01/2020   Procedure: VIDEO BRONCHOSCOPY WITH ENDOBRONCHIAL NAVIGATION;  Surgeon: Marc Senior, MD;  Location: [facility] ORS;  Service: Pulmonary;  Laterality: Left;  Brenda Barber VIDEO BRONCHOSCOPY WITH ENDOBRONCHIAL ULTRASOUND N/A 01/12/2022   Procedure: VIDEO BRONCHOSCOPY WITH ENDOBRONCHIAL ULTRASOUND;  Surgeon: Erskin Hearing, MD;  Location: [facility] ORS;  Service: Thoracic;  Laterality: N/A;  . WOUND DEBRIDEMENT Right 06/07/2023   Procedure: DEBRIDEMENT WOUND;  Surgeon: Conrado Delay, DO;  Location: [facility] ORS;  Service: General;  Laterality: Right;     Current Outpatient Medications  Medication Sig Dispense Refill  . furosemide (LASIX) 20 MG tablet Take 1 tablet (20 mg total) by mouth daily. 30 tablet 11  . acetaminophen  (TYLENOL ) 500 MG tablet Take 1,000 mg by mouth every 6 (six) hours as needed for mild pain.     . apixaban  (ELIQUIS ) 5 MG TABS tablet Take 1 tablet (5 mg total) by mouth 2 (two) times daily. 180 tablet 0  . atorvastatin  (LIPITOR) 80 MG tablet Take 80 mg by mouth at bedtime.     . calcium  carbonate (OS-CAL - DOSED IN MG OF ELEMENTAL CALCIUM ) 1250 (500 Ca) MG tablet Take 1 tablet by mouth 2 (two) times daily with a meal.    . cyanocobalamin (VITAMIN B12) 1000 MCG tablet Take 1,000 mcg by mouth daily.    Brenda Barber DILT-XR 120 MG 24 hr capsule TAKE 1 CAPSULE BY MOUTH EVERY DAY 90 capsule 0  . donepezil  (ARICEPT ) 10 MG tablet Take 10 mg by mouth at bedtime.    . dronedarone  (MULTAQ ) 400 MG tablet TAKE 1 TABLET BY MOUTH TWICE DAILY 60 tablet 0  . FARXIGA  10 MG TABS tablet TAKE 1 TABLET(10 MG) BY MOUTH DAILY BEFORE BREAKFAST 30 tablet 3  . fluticasone  (FLONASE ) 50 MCG/ACT nasal spray Place 2 sprays into both nostrils daily.    . gemfibrozil  (LOPID ) 600 MG tablet TAKE 1 TABLET BY MOUTH TWICE DAILY 30 MINUTES BEFORE BREAKFAST AND DINNER 180 tablet 0  . hydrocortisone 2.5 % lotion Apply 1 application  topically daily.    . iron  polysaccharides  (NIFEREX) 150 MG capsule Take 150 mg by mouth daily.     . isosorbide mononitrate (IMDUR) 30 MG 24 hr tablet Take 1 tablet (30 mg total) by mouth daily. 90 tablet 0  . levalbuterol (XOPENEX) 0.63 MG/3ML nebulizer solution Take 0.63 mg by nebulization every 6 (six) hours as needed for wheezing or shortness of breath.    . levothyroxine  (SYNTHROID ) 112 MCG tablet Take 112 mcg by mouth daily before breakfast.    . montelukast  (SINGULAIR ) 10 MG tablet Take 10 mg by mouth at bedtime.    . Multiple Vitamin (MULTIVITAMIN) tablet Take 1 tablet by mouth daily.    . niacin  (VITAMIN B3) 500 MG tablet Take 500 mg by mouth at bedtime.    . nitroGLYCERIN  (NITROSTAT ) 0.4 MG SL tablet Place 0.4 mg under the tongue every 5 (five) minutes as needed for chest pain.     . potassium  chloride (KLOR-CON ) 10 MEQ tablet Take 10 mEq by mouth daily.    . RESTASIS 0.05 % ophthalmic emulsion Place 1 drop into both eyes 2 (two) times daily.    . sucralfate  (CARAFATE ) 1 g tablet TAKE 1 TABLET BY MOUTH TWICE DAILY 1 HOUR BEFORE MEALS 180 tablet 0  . TRELEGY ELLIPTA  100-62.5-25 MCG/ACT AEPB Inhale 1 puff into the lungs daily.    . vitamin C  (ASCORBIC ACID ) 500 MG tablet Take 500 mg by mouth daily.     No current facility-administered medications for this visit.    Allergies:   Aspirin -dipyridamole  er, Guaifenesin , and Rosuvastatin    Social History:   reports that she has never smoked. She has never used smokeless tobacco. She reports that she does not drink alcohol  and does not use drugs.   Family History:  family history includes Breast cancer (age of onset: 59) in her sister; Diabetes in her father, paternal aunt, and paternal uncle.    ROS:     Review of Systems  Constitutional: Negative.   HENT: Negative.    Eyes: Negative.   Respiratory: Negative.    Gastrointestinal: Negative.   Genitourinary: Negative.   Musculoskeletal: Negative.   Skin: Negative.   Neurological: Negative.   Endo/Heme/Allergies:  Negative.   Psychiatric/Behavioral: Negative.    All other systems reviewed and are negative.     All other systems are reviewed and negative.    PHYSICAL EXAM: VS:  BP 122/62   Pulse 98   Ht 5' 3 (1.6 m)   Wt 149 lb 6.4 oz (67.8 kg)   SpO2 95%   BMI 26.47 kg/m  , BMI Body mass index is 26.47 kg/m. Last weight:  Wt Readings from Last 3 Encounters:  10/31/23 149 lb 6.4 oz (67.8 kg)  09/11/23 150 lb (68 kg)  08/22/23 145 lb (65.8 kg)     Physical Exam Constitutional:      Appearance: Normal appearance.   Cardiovascular:     Rate and Rhythm: Normal rate and regular rhythm.     Heart sounds: Normal heart sounds.  Pulmonary:     Effort: Pulmonary effort is normal.     Breath sounds: Normal breath sounds.   Musculoskeletal:     Right lower leg: No edema.     Left lower leg: No edema.   Neurological:     Mental Status: She is alert.      EKG:   Recent Labs: No results found for requested labs within last 365 days.    Lipid Panel    Component Value Date/Time   CHOL 250 (H) 01/11/2012 0723   TRIG 123 01/11/2012 0723   HDL 42 01/11/2012 0723   CHOLHDL 7 12/23/2009 1040   VLDL 25 01/11/2012 0723   LDLCALC 183 (H) 01/11/2012 0723   LDLDIRECT 101.1 12/23/2009 1040      Other studies Reviewed: Additional studies/ records that were reviewed today include:  Review of the above records demonstrates:      03/29/2020    4:35 PM  PAD Screen  Previous PAD dx? No  Previous surgical procedure? No  Pain with walking? No  Feet/toe relief with dangling? No  Painful, non-healing ulcers? No  Extremities discolored? No      ASSESSMENT AND PLAN:    ICD-10-CM   1. Nonrheumatic aortic valve insufficiency  I35.1 isosorbide mononitrate (IMDUR) 30 MG 24 hr tablet    furosemide (LASIX) 20 MG tablet    2. Nonrheumatic mitral valve regurgitation  I34.0 isosorbide  mononitrate (IMDUR) 30 MG 24 hr tablet    furosemide (LASIX) 20 MG tablet    3. CHF (congestive heart  failure), NYHA class I, acute on chronic, diastolic (HCC)  I50.33 isosorbide mononitrate (IMDUR) 30 MG 24 hr tablet    furosemide (LASIX) 20 MG tablet   still SOB, on farxiga , add lasix 20 daily    4. Coronary artery disease involving native coronary artery of native heart without angina pectoris  I25.10 isosorbide mononitrate (IMDUR) 30 MG 24 hr tablet    furosemide (LASIX) 20 MG tablet    5. Sleep apnea, obstructive  G47.33 isosorbide mononitrate (IMDUR) 30 MG 24 hr tablet    furosemide (LASIX) 20 MG tablet    6. Paroxysmal A-fib (HCC)  I48.0 isosorbide mononitrate (IMDUR) 30 MG 24 hr tablet    furosemide (LASIX) 20 MG tablet   No SOB, in NSR       Problem List Items Addressed This Visit       Cardiovascular and Mediastinum   Paroxysmal A-fib (HCC)   Relevant Medications   isosorbide mononitrate (IMDUR) 30 MG 24 hr tablet   furosemide (LASIX) 20 MG tablet     Respiratory   Sleep apnea, obstructive   Relevant Medications   isosorbide mononitrate (IMDUR) 30 MG 24 hr tablet   furosemide (LASIX) 20 MG tablet   Other Visit Diagnoses       Nonrheumatic aortic valve insufficiency    -  Primary   Relevant Medications   isosorbide mononitrate (IMDUR) 30 MG 24 hr tablet   furosemide (LASIX) 20 MG tablet     Nonrheumatic mitral valve regurgitation       Relevant Medications   isosorbide mononitrate (IMDUR) 30 MG 24 hr tablet   furosemide (LASIX) 20 MG tablet     CHF (congestive heart failure), NYHA class I, acute on chronic, diastolic (HCC)       still SOB, on farxiga , add lasix 20 daily   Relevant Medications   isosorbide mononitrate (IMDUR) 30 MG 24 hr tablet   furosemide (LASIX) 20 MG tablet     Coronary artery disease involving native coronary artery of native heart without angina pectoris       Relevant Medications   isosorbide mononitrate (IMDUR) 30 MG 24 hr tablet   furosemide (LASIX) 20 MG tablet          Disposition:   Return in about 3 months (around  01/31/2024).    Total time spent: 35 minutes  Signed,  Brenda Fairly, MD  10/31/2023 1:46 PM    [facility]

## 2023-11-14 DIAGNOSIS — Z0100 Encounter for examination of eyes and vision without abnormal findings: Secondary | ICD-10-CM | POA: Diagnosis not present

## 2023-11-14 DIAGNOSIS — E119 Type 2 diabetes mellitus without complications: Secondary | ICD-10-CM | POA: Diagnosis not present

## 2023-11-14 DIAGNOSIS — H35373 Puckering of macula, bilateral: Secondary | ICD-10-CM | POA: Diagnosis not present

## 2023-11-14 DIAGNOSIS — Z961 Presence of intraocular lens: Secondary | ICD-10-CM | POA: Diagnosis not present

## 2023-11-18 ENCOUNTER — Other Ambulatory Visit: Payer: Self-pay

## 2023-11-18 DIAGNOSIS — L568 Other specified acute skin changes due to ultraviolet radiation: Secondary | ICD-10-CM | POA: Diagnosis not present

## 2023-11-18 MED ORDER — DILTIAZEM HCL ER 120 MG PO CP24: 120.0000 mg | ORAL_CAPSULE | Freq: Every day | ORAL | 0 refills | Status: AC

## 2023-12-24 ENCOUNTER — Other Ambulatory Visit: Payer: Self-pay | Admitting: Cardiology

## 2024-01-12 ENCOUNTER — Other Ambulatory Visit: Payer: Self-pay | Admitting: Cardiovascular Disease

## 2024-01-14 ENCOUNTER — Other Ambulatory Visit: Payer: Self-pay | Admitting: Cardiology

## 2024-01-31 ENCOUNTER — Encounter: Payer: Self-pay | Admitting: Cardiovascular Disease

## 2024-01-31 ENCOUNTER — Ambulatory Visit (INDEPENDENT_AMBULATORY_CARE_PROVIDER_SITE_OTHER): Admitting: Cardiovascular Disease

## 2024-01-31 VITALS — BP 126/84 | HR 90 | Ht 63.0 in | Wt 158.6 lb

## 2024-01-31 DIAGNOSIS — I5033 Acute on chronic diastolic (congestive) heart failure: Secondary | ICD-10-CM | POA: Diagnosis not present

## 2024-01-31 DIAGNOSIS — G4733 Obstructive sleep apnea (adult) (pediatric): Secondary | ICD-10-CM | POA: Diagnosis not present

## 2024-01-31 DIAGNOSIS — I480 Paroxysmal atrial fibrillation: Secondary | ICD-10-CM | POA: Diagnosis not present

## 2024-01-31 DIAGNOSIS — I10 Essential (primary) hypertension: Secondary | ICD-10-CM

## 2024-01-31 DIAGNOSIS — E782 Mixed hyperlipidemia: Secondary | ICD-10-CM | POA: Diagnosis not present

## 2024-01-31 DIAGNOSIS — R0602 Shortness of breath: Secondary | ICD-10-CM | POA: Diagnosis not present

## 2024-01-31 NOTE — Progress Notes (Signed)
 Cardiology Office Note   Date:  01/31/2024   ID:  Brenda Barber, Brenda Barber [birth year], MRN [id number]  PCP:  Sadie Manna, MD  Cardiologist:  Denyse Bathe, MD      History of Present Illness: Brenda Barber is a 68 y.o. female who presents for  Chief Complaint  Patient presents with   Follow-up    3 month follow up    Have always SOB, but has asthma.      Past Medical History:  Diagnosis Date   Abscess of right breast 05/2023   Allergy    Anemia    Aortic atherosclerosis (HCC)    Asthma    Carotid artery disease (HCC)    a.) carotid doppler 01/03/2017: 1-49% RICA   Chiari I malformation (HCC)    Chronic cerebral microvascular disease    COPD (chronic obstructive pulmonary disease) (HCC)    Coronary artery disease    DDD (degenerative disc disease), thoracolumbar    Depression    Diastolic dysfunction    a.) TTE 03/08/2020: EF 55-60%, mild LVH, G1DD, mod MAC, triv MR, AoV sclerosis without stenosis; b.) TTE 01/25/2023: EF 70.6%, mild LVH, G3DD, AoV calc/sclerosis without stenosis, triv AR/TR, mild MR   Dyspnea    Essential hypertension    GERD (gastroesophageal reflux disease)    Heart murmur    History of bilateral cataract extraction    History of left heart catheterization 11/18/2017   a.) LHC 11/18/2017: normal coronaries; no CAD   History of radiation therapy 2023   Hyperlipidemia    Hypoparathyroidism (HCC)    a.) s/p throidectomy   Lacunar infarction (HCC) 03/21/2000   a.) LEFT lentiform nucleus extending into the LEFT caudate nucleus; no residual deficits   Malignant neoplasm of lower lobe of left lung (HCC) 02/2022   a.) stage I NSCLC in [place]   Memory loss    a.) on acetylcholinesterase inhibitor (donepazil)   Obstructive sleep apnea on CPAP    On apixaban  therapy    On dronedarone  therapy    Osteopenia    Paroxysmal A-fib (HCC)    a.) CHA2DS2-VASc = 6 (age, sex, HTN, CVA x2, vascular disease history) as of 06/06/2023; b.) cardiac rate/rhythm  maintained on oral diltiazem  + dronaderone; chronically anticoagulated using apixaban    Personality disorder, depressive    Pneumonia    PONV (postoperative nausea and vomiting)    difficulty breathing during endoscopy, colonoscopy performed prior w/out problems-NAUSEA ONLY   Postoperative hypothyroidism    a.) s/p thyroidectomy   Pre-diabetes    Stage 3a chronic kidney disease (CKD) (HCC)    Stroke (HCC)    Thoracic kyphosis    Thyroid  cancer (HCC) 1999   a.) stage I follicular thyroid  cancer (T3N0Mx)     Past Surgical History:  Procedure Laterality Date   CATARACT EXTRACTION W/ INTRAOCULAR LENS  IMPLANT, BILATERAL Bilateral    COLONOSCOPY WITH ESOPHAGOGASTRODUODENOSCOPY (EGD)  01/05/2013   COLONOSCOPY WITH PROPOFOL  N/A 08/22/2023   Procedure: COLONOSCOPY WITH PROPOFOL ;  Surgeon: Onita Elspeth Sharper, DO;  Location[facility] END[facility]COPY;  Service: Gastroenterology;  Laterality: N/A;  DM   ELECTROMAGNETIC NAVIGATION BROCHOSCOPY Left 10/24/2018   Procedure: ELECTROMAGNETIC NAVIGATION BRONCHOSCOPY LEFT;  Surgeon: Tamea Dedra CROME, MD;  Location: [facility] ORS;  Service: Cardiopulmonary;  Laterality: Left;   ESOPHAGOGASTRODUODENOSCOPY (EGD) WITH PROPOFOL  N/A 08/22/2023   Procedure: ESOPHAGOGASTRODUODENOSCOPY (EGD) WITH PROPOFOL ;  Surgeon: Onita Elspeth Sharper, DO;  Location: [facility] ENDOSCOPY;  Service: Gastroenterology;  Laterality: N/A;   JOINT REPLACEMENT  LEFT HEART CATH AND CORONARY ANGIOGRAPHY Right 11/18/2017   Procedure: Left Heart Cath with possible coronary intervention;  Surgeon: Fernand Denyse LABOR, MD;  Location: [facility] INVASIVE CV LAB;  Service: Cardiovascular;  Laterality: Right;   TONSILLECTOMY     TOTAL HIP ARTHROPLASTY Right 06/26/2016   Procedure: TOTAL HIP ARTHROPLASTY ANTERIOR APPROACH;  Surgeon: Ozell Flake, MD;  Location: [facility] ORS;  Service: Orthopedics;  Laterality: Right;   TOTAL THYROIDECTOMY  2000   VIDEO BRONCHOSCOPY WITH ENDOBRONCHIAL NAVIGATION Left 04/01/2020   Procedure:  VIDEO BRONCHOSCOPY WITH ENDOBRONCHIAL NAVIGATION;  Surgeon: Tamea Dedra CROME, MD;  Location: [facility] ORS;  Service: Pulmonary;  Laterality: Left;   VIDEO BRONCHOSCOPY WITH ENDOBRONCHIAL ULTRASOUND N/A 01/12/2022   Procedure: VIDEO BRONCHOSCOPY WITH ENDOBRONCHIAL ULTRASOUND;  Surgeon: Parris Manna, MD;  Location: [facility] ORS;  Service: Thoracic;  Laterality: N/A;   WOUND DEBRIDEMENT Right 06/07/2023   Procedure: DEBRIDEMENT WOUND;  Surgeon: Tye Millet, DO;  Location: [facility] ORS;  Service: General;  Laterality: Right;     Current Outpatient Medications  Medication Sig Dispense Refill   acetaminophen  (TYLENOL ) 500 MG tablet Take 1,000 mg by mouth every 6 (six) hours as needed for mild pain.      atorvastatin  (LIPITOR) 80 MG tablet Take 80 mg by mouth at bedtime.      calcium  carbonate (OS-CAL - DOSED IN MG OF ELEMENTAL CALCIUM ) 1250 (500 Ca) MG tablet Take 1 tablet by mouth 2 (two) times daily with a meal.     cyanocobalamin (VITAMIN B12) 1000 MCG tablet Take 1,000 mcg by mouth daily.     diltiazem  (DILT-XR) 120 MG 24 hr capsule Take 1 capsule (120 mg total) by mouth daily. 90 capsule 0   donepezil  (ARICEPT ) 10 MG tablet Take 10 mg by mouth at bedtime.     ELIQUIS  5 MG TABS tablet TAKE 1 TABLET(5 MG) BY MOUTH TWICE DAILY 180 tablet 0   FARXIGA  10 MG TABS tablet TAKE 1 TABLET(10 MG) BY MOUTH DAILY BEFORE BREAKFAST 30 tablet 3   fluticasone  (FLONASE ) 50 MCG/ACT nasal spray Place 2 sprays into both nostrils daily.     furosemide  (LASIX ) 20 MG tablet Take 1 tablet (20 mg total) by mouth daily. 30 tablet 11   gemfibrozil  (LOPID ) 600 MG tablet TAKE 1 TABLET BY MOUTH TWICE DAILY 30 MINUTES BEFORE BREAKFAST AND DINNER 180 tablet 0   hydrocortisone 2.5 % lotion Apply 1 application  topically daily.     iron  polysaccharides (NIFEREX) 150 MG capsule Take 150 mg by mouth daily.      isosorbide  mononitrate (IMDUR ) 30 MG 24 hr tablet Take 1 tablet (30 mg total) by mouth daily. 90 tablet 0   levalbuterol  (XOPENEX) 0.63 MG/3ML nebulizer solution Take 0.63 mg by nebulization every 6 (six) hours as needed for wheezing or shortness of breath.     levothyroxine  (SYNTHROID ) 112 MCG tablet Take 112 mcg by mouth daily before breakfast.     montelukast  (SINGULAIR ) 10 MG tablet Take 10 mg by mouth at bedtime.     MULTAQ  400 MG tablet TAKE 1 TABLET(400 MG) BY MOUTH TWICE DAILY 180 tablet 3   Multiple Vitamin (MULTIVITAMIN) tablet Take 1 tablet by mouth daily.     niacin  (VITAMIN B3) 500 MG tablet Take 500 mg by mouth at bedtime.     nitroGLYCERIN  (NITROSTAT ) 0.4 MG SL tablet Place 0.4 mg under the tongue every 5 (five) minutes as needed for chest pain.      potassium chloride  (KLOR-CON ) 10 MEQ tablet Take 10  mEq by mouth daily.     RESTASIS 0.05 % ophthalmic emulsion Place 1 drop into both eyes 2 (two) times daily.     sucralfate  (CARAFATE ) 1 g tablet TAKE 1 TABLET BY MOUTH TWICE DAILY 1 HOUR BEFORE MEALS 180 tablet 0   TRELEGY ELLIPTA  100-62.5-25 MCG/ACT AEPB Inhale 1 puff into the lungs daily.     vitamin C  (ASCORBIC ACID ) 500 MG tablet Take 500 mg by mouth daily.     No current facility-administered medications for this visit.    Allergies:   Aspirin -dipyridamole  er, Guaifenesin , and Rosuvastatin    Social History:   reports that she has never smoked. She has never used smokeless tobacco. She reports that she does not drink alcohol  and does not use drugs.   Family History:  family history includes Breast cancer (age of onset: 36) in her sister; Diabetes in her father, paternal aunt, and paternal uncle.    ROS:     Review of Systems  Constitutional: Negative.   HENT: Negative.    Eyes: Negative.   Respiratory: Negative.    Gastrointestinal: Negative.   Genitourinary: Negative.   Musculoskeletal: Negative.   Skin: Negative.   Neurological: Negative.   Endo/Heme/Allergies: Negative.   Psychiatric/Behavioral: Negative.    All other systems reviewed and are negative.     All other systems  are reviewed and negative.    PHYSICAL EXAM: VS:  BP 126/84   Pulse 90   Ht 5' 3 (1.6 m)   Wt 158 lb 9.6 oz (71.9 kg)   SpO2 95%   BMI 28.09 kg/m  , BMI Body mass index is 28.09 kg/m. Last weight:  Wt Readings from Last 3 Encounters:  01/31/24 158 lb 9.6 oz (71.9 kg)  10/31/23 149 lb 6.4 oz (67.8 kg)  09/11/23 150 lb (68 kg)     Physical Exam Constitutional:      Appearance: Normal appearance.  Cardiovascular:     Rate and Rhythm: Normal rate and regular rhythm.     Heart sounds: Normal heart sounds.  Pulmonary:     Effort: Pulmonary effort is normal.     Breath sounds: Normal breath sounds.  Musculoskeletal:     Right lower leg: No edema.     Left lower leg: No edema.  Neurological:     Mental Status: She is alert.       EKG:   Recent Labs: No results found for requested labs within last 365 days.    Lipid Panel    Component Value Date/Time   CHOL 250 (H) 01/11/2012 0723   TRIG 123 01/11/2012 0723   HDL 42 01/11/2012 0723   CHOLHDL 7 12/23/2009 1040   VLDL 25 01/11/2012 0723   LDLCALC 183 (H) 01/11/2012 0723   LDLDIRECT 101.1 12/23/2009 1040      Other studies Reviewed: Additional studies/ records that were reviewed today include:  Review of the above records demonstrates:      03/29/2020    4:35 PM  PAD Screen  Previous PAD dx? No  Previous surgical procedure? No  Pain with walking? No  Feet/toe relief with dangling? No  Painful, non-healing ulcers? No  Extremities discolored? No      ASSESSMENT AND PLAN:    ICD-10-CM   1. Primary hypertension  I10 PCV ECHOCARDIOGRAM COMPLETE    2. Paroxysmal A-fib (HCC)  I48.0 PCV ECHOCARDIOGRAM COMPLETE    3. Sleep apnea, obstructive  G47.33 PCV ECHOCARDIOGRAM COMPLETE    4. Mixed hyperlipidemia  E78.2 PCV  ECHOCARDIOGRAM COMPLETE    5. SOB (shortness of breath)  R06.02 PCV ECHOCARDIOGRAM COMPLETE    6. CHF (congestive heart failure), NYHA class I, acute on chronic, diastolic (HCC)  I50.33 PCV  ECHOCARDIOGRAM COMPLETE   on farxiga  as has grade 3 diastolic function. still SOB, check echo       Problem List Items Addressed This Visit       Cardiovascular and Mediastinum   Paroxysmal A-fib (HCC)   Relevant Orders   PCV ECHOCARDIOGRAM COMPLETE   Hypertension - Primary   Relevant Orders   PCV ECHOCARDIOGRAM COMPLETE     Respiratory   Sleep apnea, obstructive   Relevant Orders   PCV ECHOCARDIOGRAM COMPLETE     Other   Hyperlipidemia   Relevant Orders   PCV ECHOCARDIOGRAM COMPLETE   Other Visit Diagnoses       SOB (shortness of breath)       Relevant Orders   PCV ECHOCARDIOGRAM COMPLETE     CHF (congestive heart failure), NYHA class I, acute on chronic, diastolic (HCC)       on farxiga  as has grade 3 diastolic function. still SOB, check echo   Relevant Orders   PCV ECHOCARDIOGRAM COMPLETE          Disposition:   Return in about 6 weeks (around 03/13/2024) for echo and f/u.    Total time spent: 30 minutes  Signed,  Denyse Bathe, MD  01/31/2024 12:04 PM    [facility]

## 2024-02-11 DIAGNOSIS — E892 Postprocedural hypoparathyroidism: Secondary | ICD-10-CM | POA: Diagnosis not present

## 2024-02-11 DIAGNOSIS — N1832 Chronic kidney disease, stage 3b: Secondary | ICD-10-CM | POA: Diagnosis not present

## 2024-02-11 DIAGNOSIS — E890 Postprocedural hypothyroidism: Secondary | ICD-10-CM | POA: Diagnosis not present

## 2024-02-18 DIAGNOSIS — Z85850 Personal history of malignant neoplasm of thyroid: Secondary | ICD-10-CM | POA: Diagnosis not present

## 2024-02-18 DIAGNOSIS — E892 Postprocedural hypoparathyroidism: Secondary | ICD-10-CM | POA: Diagnosis not present

## 2024-02-18 DIAGNOSIS — N1832 Chronic kidney disease, stage 3b: Secondary | ICD-10-CM | POA: Diagnosis not present

## 2024-02-18 DIAGNOSIS — E890 Postprocedural hypothyroidism: Secondary | ICD-10-CM | POA: Diagnosis not present

## 2024-02-28 ENCOUNTER — Ambulatory Visit

## 2024-02-28 DIAGNOSIS — R0602 Shortness of breath: Secondary | ICD-10-CM

## 2024-02-28 DIAGNOSIS — G4733 Obstructive sleep apnea (adult) (pediatric): Secondary | ICD-10-CM

## 2024-02-28 DIAGNOSIS — I340 Nonrheumatic mitral (valve) insufficiency: Secondary | ICD-10-CM

## 2024-02-28 DIAGNOSIS — I361 Nonrheumatic tricuspid (valve) insufficiency: Secondary | ICD-10-CM | POA: Diagnosis not present

## 2024-02-28 DIAGNOSIS — I10 Essential (primary) hypertension: Secondary | ICD-10-CM

## 2024-02-28 DIAGNOSIS — E782 Mixed hyperlipidemia: Secondary | ICD-10-CM

## 2024-02-28 DIAGNOSIS — I480 Paroxysmal atrial fibrillation: Secondary | ICD-10-CM

## 2024-02-28 DIAGNOSIS — I5033 Acute on chronic diastolic (congestive) heart failure: Secondary | ICD-10-CM

## 2024-03-09 ENCOUNTER — Ambulatory Visit: Admitting: Cardiovascular Disease

## 2024-03-10 ENCOUNTER — Ambulatory Visit: Admitting: Cardiovascular Disease

## 2024-03-12 ENCOUNTER — Ambulatory Visit: Admission: RE | Admit: 2024-03-12 | Source: Ambulatory Visit

## 2024-03-13 ENCOUNTER — Other Ambulatory Visit: Payer: Self-pay | Admitting: *Deleted

## 2024-03-15 ENCOUNTER — Other Ambulatory Visit: Payer: Self-pay | Admitting: Cardiovascular Disease

## 2024-03-15 DIAGNOSIS — I2510 Atherosclerotic heart disease of native coronary artery without angina pectoris: Secondary | ICD-10-CM

## 2024-03-15 DIAGNOSIS — I340 Nonrheumatic mitral (valve) insufficiency: Secondary | ICD-10-CM

## 2024-03-15 DIAGNOSIS — I480 Paroxysmal atrial fibrillation: Secondary | ICD-10-CM

## 2024-03-15 DIAGNOSIS — I351 Nonrheumatic aortic (valve) insufficiency: Secondary | ICD-10-CM

## 2024-03-15 DIAGNOSIS — N1832 Chronic kidney disease, stage 3b: Secondary | ICD-10-CM

## 2024-03-15 DIAGNOSIS — I10 Essential (primary) hypertension: Secondary | ICD-10-CM

## 2024-03-15 DIAGNOSIS — I700 Atherosclerosis of aorta: Secondary | ICD-10-CM

## 2024-03-16 ENCOUNTER — Ambulatory Visit: Admitting: Cardiovascular Disease

## 2024-03-17 ENCOUNTER — Encounter: Payer: Self-pay | Admitting: Radiation Oncology

## 2024-03-19 ENCOUNTER — Ambulatory Visit: Admitting: Radiation Oncology

## 2024-03-20 NOTE — Telephone Encounter (Signed)
 This is the only person Brenda Barber for this patient and the last time she was here 02/27/23 she has next appt for 4/23 at 10:30 and I told her that we wanted her to know about the appt, and she will be there.

## 2024-03-25 DIAGNOSIS — E785 Hyperlipidemia, unspecified: Secondary | ICD-10-CM | POA: Diagnosis not present

## 2024-03-25 DIAGNOSIS — N1832 Chronic kidney disease, stage 3b: Secondary | ICD-10-CM | POA: Diagnosis not present

## 2024-03-25 DIAGNOSIS — I639 Cerebral infarction, unspecified: Secondary | ICD-10-CM | POA: Diagnosis not present

## 2024-03-25 DIAGNOSIS — D631 Anemia in chronic kidney disease: Secondary | ICD-10-CM | POA: Diagnosis not present

## 2024-03-25 DIAGNOSIS — E039 Hypothyroidism, unspecified: Secondary | ICD-10-CM | POA: Diagnosis not present

## 2024-03-25 DIAGNOSIS — I10 Essential (primary) hypertension: Secondary | ICD-10-CM | POA: Diagnosis not present

## 2024-03-25 DIAGNOSIS — R809 Proteinuria, unspecified: Secondary | ICD-10-CM | POA: Diagnosis not present

## 2024-03-25 DIAGNOSIS — G4733 Obstructive sleep apnea (adult) (pediatric): Secondary | ICD-10-CM | POA: Diagnosis not present

## 2024-03-30 ENCOUNTER — Ambulatory Visit: Admitting: Cardiovascular Disease

## 2024-03-30 ENCOUNTER — Encounter: Payer: Self-pay | Admitting: Cardiovascular Disease

## 2024-03-30 VITALS — BP 132/82 | HR 80 | Ht 63.0 in | Wt 156.8 lb

## 2024-03-30 DIAGNOSIS — I5033 Acute on chronic diastolic (congestive) heart failure: Secondary | ICD-10-CM

## 2024-03-30 DIAGNOSIS — N1832 Chronic kidney disease, stage 3b: Secondary | ICD-10-CM | POA: Diagnosis not present

## 2024-03-30 DIAGNOSIS — I480 Paroxysmal atrial fibrillation: Secondary | ICD-10-CM

## 2024-03-30 DIAGNOSIS — I2510 Atherosclerotic heart disease of native coronary artery without angina pectoris: Secondary | ICD-10-CM

## 2024-03-30 DIAGNOSIS — I10 Essential (primary) hypertension: Secondary | ICD-10-CM

## 2024-03-30 DIAGNOSIS — I340 Nonrheumatic mitral (valve) insufficiency: Secondary | ICD-10-CM | POA: Diagnosis not present

## 2024-03-30 MED ORDER — SPIRONOLACTONE 25 MG PO TABS: 12.5000 mg | ORAL_TABLET | Freq: Every day | ORAL | 11 refills | Status: AC

## 2024-03-30 NOTE — Progress Notes (Addendum)
 Cardiology Office Note   Date:  03/30/2024   ID:  Brenda Barber 1956-02-07, MRN [id number]  PCP:  Sadie Manna, MD  Cardiologist:  Denyse Bathe, MD      History of Present Illness: Brenda Barber is a 68 y.o. female who presents for  Chief Complaint  Patient presents with   Follow-up    6 week echo results    Feeling good      Past Medical History:  Diagnosis Date   Abscess of right breast 05/2023   Allergy    Anemia    Aortic atherosclerosis    Asthma    Carotid artery disease    a.) carotid doppler 01/03/2017: 1-49% RICA   Chiari I malformation (HCC)    Chronic cerebral microvascular disease    COPD (chronic obstructive pulmonary disease) (HCC)    Coronary artery disease    DDD (degenerative disc disease), thoracolumbar    Depression    Diastolic dysfunction    a.) TTE 03/08/2020: EF 55-60%, mild LVH, G1DD, mod MAC, triv MR, AoV sclerosis without stenosis; b.) TTE 01/25/2023: EF 70.6%, mild LVH, G3DD, AoV calc/sclerosis without stenosis, triv AR/TR, mild MR   Dyspnea    Essential hypertension    GERD (gastroesophageal reflux disease)    Heart murmur    History of bilateral cataract extraction    History of left heart catheterization 11/18/2017   a.) LHC 11/18/2017: normal coronaries; no CAD   History of radiation therapy 2023   Hyperlipidemia    Hypoparathyroidism    a.) s/p throidectomy   Lacunar infarction (HCC) 03/21/2000   a.) LEFT lentiform nucleus extending into the LEFT caudate nucleus; no residual deficits   Malignant neoplasm of lower lobe of left lung (HCC) 02/2022   a.) stage I NSCLC in [place]   Memory loss    a.) on acetylcholinesterase inhibitor (donepazil)   Obstructive sleep apnea on CPAP    On apixaban  therapy    On dronedarone  therapy    Osteopenia    Paroxysmal A-fib (HCC)    a.) CHA2DS2-VASc = 6 (age, sex, HTN, CVA x2, vascular disease history) as of 06/06/2023; b.) cardiac rate/rhythm maintained on oral diltiazem  +  dronaderone; chronically anticoagulated using apixaban    Personality disorder, depressive    Pneumonia    PONV (postoperative nausea and vomiting)    difficulty breathing during endoscopy, colonoscopy performed prior w/out problems-NAUSEA ONLY   Postoperative hypothyroidism    a.) s/p thyroidectomy   Pre-diabetes    Stage 3a chronic kidney disease (CKD) (HCC)    Stroke (HCC)    Thoracic kyphosis    Thyroid  cancer (HCC) 1999   a.) stage I follicular thyroid  cancer (T3N0Mx)     Past Surgical History:  Procedure Laterality Date   CATARACT EXTRACTION W/ INTRAOCULAR LENS  IMPLANT, BILATERAL Bilateral    COLONOSCOPY WITH ESOPHAGOGASTRODUODENOSCOPY (EGD)  01/05/2013   COLONOSCOPY WITH PROPOFOL  N/A 08/22/2023   Procedure: COLONOSCOPY WITH PROPOFOL ;  Surgeon: Onita Elspeth Sharper, DO;  Location: [facility] ENDOSCOPY;  Service: Gastroenterology;  Laterality: N/A;  DM   ELECTROMAGNETIC NAVIGATION BROCHOSCOPY Left 10/24/2018   Procedure: ELECTROMAGNETIC NAVIGATION BRONCHOSCOPY LEFT;  Surgeon: Tamea Dedra CROME, MD;  Location: [facility] ORS;  Service: Cardiopulmonary;  Laterality: Left;   ESOPHAGOGASTRODUODENOSCOPY (EGD) WITH PROPOFOL  N/A 08/22/2023   Procedure: ESOPHAGOGASTRODUODENOSCOPY (EGD) WITH PROPOFOL ;  Surgeon: Onita Elspeth Sharper, DO;  Location[facility] ENDOSCOPY;  Service: Gastroenterology;  Laterality: N/A;   JOINT REPLACEMENT     LEFT HEART CATH AND CORONARY ANGIOGRAPHY  Right 11/18/2017   Procedure: Left Heart Cath with possible coronary intervention;  Surgeon: Fernand Denyse LABOR, MD;  Location: [facility] INVASIVE CV LAB;  Service: Cardiovascular;  Laterality: Right;   TONSILLECTOMY     TOTAL HIP ARTHROPLASTY Right 06/26/2016   Procedure: TOTAL HIP ARTHROPLASTY ANTERIOR APPROACH;  Surgeon: Ozell Flake, MD;  Location: [facility] ORS;  Service: Orthopedics;  Laterality: Right;   TOTAL THYROIDECTOMY  2000   VIDEO BRONCHOSCOPY WITH ENDOBRONCHIAL NAVIGATION Left 04/01/2020   Procedure: VIDEO BRONCHOSCOPY WITH  ENDOBRONCHIAL NAVIGATION;  Surgeon: Tamea Dedra CROME, MD;  Location: [facility] ORS;  Service: Pulmonary;  Laterality: Left;   VIDEO BRONCHOSCOPY WITH ENDOBRONCHIAL ULTRASOUND N/A 01/12/2022   Procedure: VIDEO BRONCHOSCOPY WITH ENDOBRONCHIAL ULTRASOUND;  Surgeon: Parris Manna, MD;  Location: [facility] ORS;  Service: Thoracic;  Laterality: N/A;   WOUND DEBRIDEMENT Right 06/07/2023   Procedure: DEBRIDEMENT WOUND;  Surgeon: Tye Millet, DO;  Location: [facility] ORS;  Service: General;  Laterality: Right;     Current Outpatient Medications  Medication Sig Dispense Refill   acetaminophen  (TYLENOL ) 500 MG tablet Take 1,000 mg by mouth every 6 (six) hours as needed for mild pain.      atorvastatin  (LIPITOR) 80 MG tablet Take 80 mg by mouth at bedtime.      calcium  carbonate (OS-CAL - DOSED IN MG OF ELEMENTAL CALCIUM ) 1250 (500 Ca) MG tablet Take 1 tablet by mouth 2 (two) times daily with a meal.     cyanocobalamin (VITAMIN B12) 1000 MCG tablet Take 1,000 mcg by mouth daily.     dapagliflozin  propanediol (FARXIGA ) 10 MG TABS tablet TAKE 1 TABLET(10 MG) BY MOUTH DAILY BEFORE BREAKFAST 30 tablet 3   diltiazem  (DILT-XR) 120 MG 24 hr capsule Take 1 capsule (120 mg total) by mouth daily. 90 capsule 0   donepezil  (ARICEPT ) 10 MG tablet Take 10 mg by mouth at bedtime.     ELIQUIS  5 MG TABS tablet TAKE 1 TABLET(5 MG) BY MOUTH TWICE DAILY 180 tablet 0   fluticasone  (FLONASE ) 50 MCG/ACT nasal spray Place 2 sprays into both nostrils daily.     furosemide  (LASIX ) 20 MG tablet Take 1 tablet (20 mg total) by mouth daily. 30 tablet 11   gemfibrozil  (LOPID ) 600 MG tablet TAKE 1 TABLET BY MOUTH TWICE DAILY 30 MINUTES BEFORE BREAKFAST AND DINNER 180 tablet 0   hydrocortisone 2.5 % lotion Apply 1 application  topically daily.     iron  polysaccharides (NIFEREX) 150 MG capsule Take 150 mg by mouth daily.      isosorbide  mononitrate (IMDUR ) 30 MG 24 hr tablet Take 1 tablet (30 mg total) by mouth daily. 90 tablet 0   levalbuterol  (XOPENEX) 0.63 MG/3ML nebulizer solution Take 0.63 mg by nebulization every 6 (six) hours as needed for wheezing or shortness of breath.     levothyroxine  (SYNTHROID ) 112 MCG tablet Take 112 mcg by mouth daily before breakfast.     montelukast  (SINGULAIR ) 10 MG tablet Take 10 mg by mouth at bedtime.     MULTAQ  400 MG tablet TAKE 1 TABLET(400 MG) BY MOUTH TWICE DAILY 180 tablet 3   Multiple Vitamin (MULTIVITAMIN) tablet Take 1 tablet by mouth daily.     niacin  (VITAMIN B3) 500 MG tablet Take 500 mg by mouth at bedtime.     nitroGLYCERIN  (NITROSTAT ) 0.4 MG SL tablet Place 0.4 mg under the tongue every 5 (five) minutes as needed for chest pain.      RESTASIS 0.05 % ophthalmic emulsion Place 1 drop into both eyes 2 (  two) times daily.     spironolactone (ALDACTONE) 25 MG tablet Take 0.5 tablets (12.5 mg total) by mouth daily. 30 tablet 11   sucralfate  (CARAFATE ) 1 g tablet TAKE 1 TABLET BY MOUTH TWICE DAILY 1 HOUR BEFORE MEALS 180 tablet 0   TRELEGY ELLIPTA  100-62.5-25 MCG/ACT AEPB Inhale 1 puff into the lungs daily.     vitamin C  (ASCORBIC ACID ) 500 MG tablet Take 500 mg by mouth daily.     No current facility-administered medications for this visit.    Allergies:   Aspirin -dipyridamole  er, Guaifenesin , and Rosuvastatin    Social History:   reports that she has never smoked. She has never used smokeless tobacco. She reports that she does not drink alcohol  and does not use drugs.   Family History:  family history includes Breast cancer (age of onset: 19) in her sister; Diabetes in her father, paternal aunt, and paternal uncle.    ROS:     Review of Systems  Constitutional: Negative.   HENT: Negative.    Eyes: Negative.   Respiratory: Negative.    Gastrointestinal: Negative.   Genitourinary: Negative.   Musculoskeletal: Negative.   Skin: Negative.   Neurological: Negative.   Endo/Heme/Allergies: Negative.   Psychiatric/Behavioral: Negative.    All other systems reviewed and are  negative.     All other systems are reviewed and negative.    PHYSICAL EXAM: VS:  BP 132/82   Pulse 80   Ht 5' 3 (1.6 m)   Wt 156 lb 12.8 oz (71.1 kg)   SpO2 95%   BMI 27.78 kg/m  , BMI Body mass index is 27.78 kg/m. Last weight:  Wt Readings from Last 3 Encounters:  03/30/24 156 lb 12.8 oz (71.1 kg)  01/31/24 158 lb 9.6 oz (71.9 kg)  10/31/23 149 lb 6.4 oz (67.8 kg)     Physical Exam Constitutional:      Appearance: Normal appearance.  Cardiovascular:     Rate and Rhythm: Normal rate and regular rhythm.     Heart sounds: Normal heart sounds.  Pulmonary:     Effort: Pulmonary effort is normal.     Breath sounds: Normal breath sounds.  Musculoskeletal:     Right lower leg: No edema.     Left lower leg: No edema.  Neurological:     Mental Status: She is alert.       EKG:   Recent Labs: No results found for requested labs within last 365 days.    Lipid Panel    Component Value Date/Time   CHOL 250 (H) 01/11/2012 0723   TRIG 123 01/11/2012 0723   HDL 42 01/11/2012 0723   CHOLHDL 7 12/23/2009 1040   VLDL 25 01/11/2012 0723   LDLCALC 183 (H) 01/11/2012 0723   LDLDIRECT 101.1 12/23/2009 1040      Other studies Reviewed: Additional studies/ records that were reviewed today include:  Review of the above records demonstrates:      03/29/2020    4:35 PM  PAD Screen  Previous PAD dx? No  Previous surgical procedure? No  Pain with walking? No  Feet/toe relief with dangling? No  Painful, non-healing ulcers? No  Extremities discolored? No      ASSESSMENT AND PLAN:    ICD-10-CM   1. Nonrheumatic mitral valve regurgitation  I34.0 spironolactone (ALDACTONE) 25 MG tablet   Trace MR, diastolic dysfunction, grade 1 diastolic dysfunction, on lasix  and farxiga     2. Coronary artery disease involving native coronary artery of native heart  without angina pectoris  I25.10 spironolactone (ALDACTONE) 25 MG tablet    3. Stage 3b chronic kidney disease (HCC)   N18.32 spironolactone (ALDACTONE) 25 MG tablet    4. Primary hypertension  I10 spironolactone (ALDACTONE) 25 MG tablet    5. Paroxysmal A-fib (HCC)  I48.0 spironolactone (ALDACTONE) 25 MG tablet    6. CHF (congestive heart failure), NYHA class III, acute on chronic, diastolic (HCC)  I50.33    Add aldactone 12.5 to farxia and lasix  as still SOB.       Problem List Items Addressed This Visit       Cardiovascular and Mediastinum   Paroxysmal A-fib (HCC)   Relevant Medications   spironolactone (ALDACTONE) 25 MG tablet   Hypertension   Relevant Medications   spironolactone (ALDACTONE) 25 MG tablet     Genitourinary   CKD (chronic kidney disease) stage 3, GFR 30-59 ml/min (HCC)   Relevant Medications   spironolactone (ALDACTONE) 25 MG tablet   Other Visit Diagnoses       Nonrheumatic mitral valve regurgitation    -  Primary   Trace MR, diastolic dysfunction, grade 1 diastolic dysfunction, on lasix  and farxiga    Relevant Medications   spironolactone (ALDACTONE) 25 MG tablet     Coronary artery disease involving native coronary artery of native heart without angina pectoris       Relevant Medications   spironolactone (ALDACTONE) 25 MG tablet     CHF (congestive heart failure), NYHA class III, acute on chronic, diastolic (HCC)       Add aldactone 12.5 to farxia and lasix  as still SOB.   Relevant Medications   spironolactone (ALDACTONE) 25 MG tablet          Disposition:   Return in about 4 weeks (around 04/27/2024).    Total time spent: 35 minutes  Signed,  Denyse Bathe, MD  03/30/2024 2:41 PM    [facility]

## 2024-04-07 DIAGNOSIS — Z23 Encounter for immunization: Secondary | ICD-10-CM | POA: Diagnosis not present

## 2024-04-07 DIAGNOSIS — G4733 Obstructive sleep apnea (adult) (pediatric): Secondary | ICD-10-CM | POA: Diagnosis not present

## 2024-04-07 DIAGNOSIS — R918 Other nonspecific abnormal finding of lung field: Secondary | ICD-10-CM | POA: Diagnosis not present

## 2024-04-07 DIAGNOSIS — R053 Chronic cough: Secondary | ICD-10-CM | POA: Diagnosis not present

## 2024-04-07 DIAGNOSIS — J439 Emphysema, unspecified: Secondary | ICD-10-CM | POA: Diagnosis not present

## 2024-04-10 ENCOUNTER — Other Ambulatory Visit: Payer: Self-pay | Admitting: Cardiovascular Disease

## 2024-04-13 ENCOUNTER — Other Ambulatory Visit: Payer: Self-pay | Admitting: Cardiology

## 2024-04-13 ENCOUNTER — Other Ambulatory Visit: Payer: Self-pay | Admitting: Cardiovascular Disease

## 2024-04-13 DIAGNOSIS — I480 Paroxysmal atrial fibrillation: Secondary | ICD-10-CM

## 2024-04-13 DIAGNOSIS — I351 Nonrheumatic aortic (valve) insufficiency: Secondary | ICD-10-CM

## 2024-04-13 DIAGNOSIS — I5033 Acute on chronic diastolic (congestive) heart failure: Secondary | ICD-10-CM

## 2024-04-13 DIAGNOSIS — G4733 Obstructive sleep apnea (adult) (pediatric): Secondary | ICD-10-CM

## 2024-04-13 DIAGNOSIS — I2510 Atherosclerotic heart disease of native coronary artery without angina pectoris: Secondary | ICD-10-CM

## 2024-04-13 DIAGNOSIS — I340 Nonrheumatic mitral (valve) insufficiency: Secondary | ICD-10-CM

## 2024-04-22 DIAGNOSIS — G479 Sleep disorder, unspecified: Secondary | ICD-10-CM | POA: Diagnosis not present

## 2024-04-22 DIAGNOSIS — R2689 Other abnormalities of gait and mobility: Secondary | ICD-10-CM | POA: Diagnosis not present

## 2024-04-22 DIAGNOSIS — H9190 Unspecified hearing loss, unspecified ear: Secondary | ICD-10-CM | POA: Diagnosis not present

## 2024-04-22 DIAGNOSIS — I480 Paroxysmal atrial fibrillation: Secondary | ICD-10-CM | POA: Diagnosis not present

## 2024-04-22 DIAGNOSIS — R413 Other amnesia: Secondary | ICD-10-CM | POA: Diagnosis not present

## 2024-04-22 DIAGNOSIS — N1832 Chronic kidney disease, stage 3b: Secondary | ICD-10-CM | POA: Diagnosis not present

## 2024-04-27 ENCOUNTER — Ambulatory Visit: Admitting: Cardiovascular Disease

## 2024-04-30 ENCOUNTER — Encounter: Payer: Self-pay | Admitting: Cardiovascular Disease

## 2024-04-30 ENCOUNTER — Ambulatory Visit: Admitting: Cardiovascular Disease

## 2024-04-30 VITALS — BP 128/64 | HR 94 | Ht 63.0 in | Wt 161.4 lb

## 2024-04-30 DIAGNOSIS — G4733 Obstructive sleep apnea (adult) (pediatric): Secondary | ICD-10-CM | POA: Diagnosis not present

## 2024-04-30 DIAGNOSIS — R0602 Shortness of breath: Secondary | ICD-10-CM | POA: Diagnosis not present

## 2024-04-30 DIAGNOSIS — I480 Paroxysmal atrial fibrillation: Secondary | ICD-10-CM | POA: Diagnosis not present

## 2024-04-30 DIAGNOSIS — I5033 Acute on chronic diastolic (congestive) heart failure: Secondary | ICD-10-CM | POA: Diagnosis not present

## 2024-04-30 DIAGNOSIS — I2510 Atherosclerotic heart disease of native coronary artery without angina pectoris: Secondary | ICD-10-CM | POA: Diagnosis not present

## 2024-04-30 DIAGNOSIS — I340 Nonrheumatic mitral (valve) insufficiency: Secondary | ICD-10-CM | POA: Diagnosis not present

## 2024-04-30 DIAGNOSIS — Z0130 Encounter for examination of blood pressure without abnormal findings: Secondary | ICD-10-CM

## 2024-04-30 DIAGNOSIS — E782 Mixed hyperlipidemia: Secondary | ICD-10-CM | POA: Diagnosis not present

## 2024-04-30 DIAGNOSIS — N1832 Chronic kidney disease, stage 3b: Secondary | ICD-10-CM

## 2024-04-30 DIAGNOSIS — I351 Nonrheumatic aortic (valve) insufficiency: Secondary | ICD-10-CM | POA: Diagnosis not present

## 2024-04-30 NOTE — Progress Notes (Signed)
 Cardiology Office Note   Date:  04/30/2024   ID:  Brenda Barber,  26-Feb-1956, MRN [id number]  PCP:  Sadie Manna, MD  Cardiologist:  Denyse Bathe, MD      History of Present Illness: Brenda Barber is a 68 y.o. female who presents for  Chief Complaint  Patient presents with   Follow-up    4 week follow up    No chest pains or SOB.      Past Medical History:  Diagnosis Date   Abscess of right breast 05/2023   Allergy    Anemia    Aortic atherosclerosis    Asthma    Carotid artery disease    a.) carotid doppler 01/03/2017: 1-49% RICA   Chiari I malformation (HCC)    Chronic cerebral microvascular disease    COPD (chronic obstructive pulmonary disease) (HCC)    Coronary artery disease    DDD (degenerative disc disease), thoracolumbar    Depression    Diastolic dysfunction    a.) TTE 03/08/2020: EF 55-60%, mild LVH, G1DD, mod MAC, triv MR, AoV sclerosis without stenosis; b.) TTE 01/25/2023: EF 70.6%, mild LVH, G3DD, AoV calc/sclerosis without stenosis, triv AR/TR, mild MR   Dyspnea    Essential hypertension    GERD (gastroesophageal reflux disease)    Heart murmur    History of bilateral cataract extraction    History of left heart catheterization 11/18/2017   a.) LHC 11/18/2017: normal coronaries; no CAD   History of radiation therapy 2023   Hyperlipidemia    Hypoparathyroidism    a.) s/p throidectomy   Lacunar infarction (HCC) 03/21/2000   a.) LEFT lentiform nucleus extending into the LEFT caudate nucleus; no residual deficits   Malignant neoplasm of lower lobe of left lung (HCC) 02/2022   a.) stage I NSCLC in [place]   Memory loss    a.) on acetylcholinesterase inhibitor (donepazil)   Obstructive sleep apnea on CPAP    On apixaban  therapy    On dronedarone  therapy    Osteopenia    Paroxysmal A-fib (HCC)    a.) CHA2DS2-VASc = 6 (age, sex, HTN, CVA x2, vascular disease history) as of 06/06/2023; b.) cardiac rate/rhythm maintained on oral diltiazem   + dronaderone; chronically anticoagulated using apixaban    Personality disorder, depressive    Pneumonia    PONV (postoperative nausea and vomiting)    difficulty breathing during endoscopy, colonoscopy performed prior w/out problems-NAUSEA ONLY   Postoperative hypothyroidism    a.) s/p thyroidectomy   Pre-diabetes    Stage 3a chronic kidney disease (CKD) (HCC)    Stroke (HCC)    Thoracic kyphosis    Thyroid  cancer (HCC) 1999   a.) stage I follicular thyroid  cancer (T3N0Mx)     Past Surgical History:  Procedure Laterality Date   CATARACT EXTRACTION W/ INTRAOCULAR LENS  IMPLANT, BILATERAL Bilateral    COLONOSCOPY WITH ESOPHAGOGASTRODUODENOSCOPY (EGD)  01/05/2013   COLONOSCOPY WITH PROPOFOL  N/A 08/22/2023   Procedure: COLONOSCOPY WITH PROPOFOL ;  Surgeon: Onita Elspeth Sharper, DO;  Location: [facility] ENDOSCOPY;  Service: Gastroenterology;  Laterality: N/A;  DM   ELECTROMAGNETIC NAVIGATION BROCHOSCOPY Left 10/24/2018   Procedure: ELECTROMAGNETIC NAVIGATION BRONCHOSCOPY LEFT;  Surgeon: Tamea Dedra CROME, MD;  Location: [facility] ORS;  Service: Cardiopulmonary;  Laterality: Left;   ESOPHAGOGASTRODUODENOSCOPY (EGD) WITH PROPOFOL  N/A 08/22/2023   Procedure: ESOPHAGOGASTRODUODENOSCOPY (EGD) WITH PROPOFOL ;  Surgeon: Onita Elspeth Sharper, DO;  Location: [facility] ENDOSCOPY;  Service: Gastroenterology;  Laterality: N/A;   JOINT REPLACEMENT     LEFT HEART CATH  AND CORONARY ANGIOGRAPHY Right 11/18/2017   Procedure: Left Heart Cath with possible coronary intervention;  Surgeon: Fernand Denyse LABOR, MD;  Location: [facility] INVASIVE CV LAB;  Service: Cardiovascular;  Laterality: Right;   TONSILLECTOMY     TOTAL HIP ARTHROPLASTY Right 06/26/2016   Procedure: TOTAL HIP ARTHROPLASTY ANTERIOR APPROACH;  Surgeon: Ozell Flake, MD;  Location: [facility] ORS;  Service: Orthopedics;  Laterality: Right;   TOTAL THYROIDECTOMY  2000   VIDEO BRONCHOSCOPY WITH ENDOBRONCHIAL NAVIGATION Left 04/01/2020   Procedure: VIDEO BRONCHOSCOPY WITH  ENDOBRONCHIAL NAVIGATION;  Surgeon: Tamea Dedra CROME, MD;  Location: [facility] ORS;  Service: Pulmonary;  Laterality: Left;   VIDEO BRONCHOSCOPY WITH ENDOBRONCHIAL ULTRASOUND N/A 01/12/2022   Procedure: VIDEO BRONCHOSCOPY WITH ENDOBRONCHIAL ULTRASOUND;  Surgeon: Parris Manna, MD;  Location: [facility] ORS;  Service: Thoracic;  Laterality: N/A;   WOUND DEBRIDEMENT Right 06/07/2023   Procedure: DEBRIDEMENT WOUND;  Surgeon: Tye Millet, DO;  Location: [facility] ORS;  Service: General;  Laterality: Right;     Current Outpatient Medications  Medication Sig Dispense Refill   acetaminophen  (TYLENOL ) 500 MG tablet Take 1,000 mg by mouth every 6 (six) hours as needed for mild pain.      atorvastatin  (LIPITOR) 80 MG tablet Take 80 mg by mouth at bedtime.      calcium  carbonate (OS-CAL - DOSED IN MG OF ELEMENTAL CALCIUM ) 1250 (500 Ca) MG tablet Take 1 tablet by mouth 2 (two) times daily with a meal.     cyanocobalamin (VITAMIN B12) 1000 MCG tablet Take 1,000 mcg by mouth daily.     dapagliflozin  propanediol (FARXIGA ) 10 MG TABS tablet TAKE 1 TABLET(10 MG) BY MOUTH DAILY BEFORE BREAKFAST 30 tablet 3   diltiazem  (DILT-XR) 120 MG 24 hr capsule Take 1 capsule (120 mg total) by mouth daily. 90 capsule 0   donepezil  (ARICEPT ) 10 MG tablet Take 10 mg by mouth at bedtime.     ELIQUIS  5 MG TABS tablet TAKE 1 TABLET(5 MG) BY MOUTH TWICE DAILY 180 tablet 0   fluticasone  (FLONASE ) 50 MCG/ACT nasal spray Place 2 sprays into both nostrils daily.     furosemide  (LASIX ) 20 MG tablet Take 1 tablet (20 mg total) by mouth daily. 30 tablet 11   gemfibrozil  (LOPID ) 600 MG tablet TAKE 1 TABLET BY MOUTH TWICE DAILY 30 MINUTES BEFORE BREAKFAST AND DINNER 180 tablet 0   hydrocortisone 2.5 % lotion Apply 1 application  topically daily.     iron  polysaccharides (NIFEREX) 150 MG capsule Take 150 mg by mouth daily.      isosorbide  mononitrate (IMDUR ) 30 MG 24 hr tablet TAKE 1 TABLET(30 MG) BY MOUTH DAILY 90 tablet 0   levalbuterol (XOPENEX)  0.63 MG/3ML nebulizer solution Take 0.63 mg by nebulization every 6 (six) hours as needed for wheezing or shortness of breath.     levothyroxine  (SYNTHROID ) 112 MCG tablet Take 112 mcg by mouth daily before breakfast.     metoprolol  succinate (TOPROL  XL) 25 MG 24 hr tablet Take 0.5 tablets (12.5 mg total) by mouth daily. 15 tablet 11   montelukast  (SINGULAIR ) 10 MG tablet Take 10 mg by mouth at bedtime.     MULTAQ  400 MG tablet TAKE 1 TABLET(400 MG) BY MOUTH TWICE DAILY 180 tablet 3   Multiple Vitamin (MULTIVITAMIN) tablet Take 1 tablet by mouth daily.     niacin  (VITAMIN B3) 500 MG tablet Take 500 mg by mouth at bedtime.     nitroGLYCERIN  (NITROSTAT ) 0.4 MG SL tablet Place 0.4 mg under the tongue every 5 (  five) minutes as needed for chest pain.      RESTASIS 0.05 % ophthalmic emulsion Place 1 drop into both eyes 2 (two) times daily.     spironolactone  (ALDACTONE ) 25 MG tablet Take 0.5 tablets (12.5 mg total) by mouth daily. 30 tablet 11   sucralfate  (CARAFATE ) 1 g tablet TAKE 1 TABLET BY MOUTH TWICE DAILY 1 HOUR BEFORE MEALS 180 tablet 0   TRELEGY ELLIPTA  100-62.5-25 MCG/ACT AEPB Inhale 1 puff into the lungs daily.     vitamin C  (ASCORBIC ACID ) 500 MG tablet Take 500 mg by mouth daily.     No current facility-administered medications for this visit.    Allergies:   Aspirin -dipyridamole  er, Guaifenesin , and Rosuvastatin    Social History:   reports that she has never smoked. She has never used smokeless tobacco. She reports that she does not drink alcohol  and does not use drugs.   Family History:  family history includes Breast cancer (age of onset: 21) in her sister; Diabetes in her father, paternal aunt, and paternal uncle.    ROS:     Review of Systems  Constitutional: Negative.   HENT: Negative.    Eyes: Negative.   Respiratory: Negative.    Gastrointestinal: Negative.   Genitourinary: Negative.   Musculoskeletal: Negative.   Skin: Negative.   Neurological: Negative.    Endo/Heme/Allergies: Negative.   Psychiatric/Behavioral: Negative.    All other systems reviewed and are negative.     All other systems are reviewed and negative.    PHYSICAL EXAM: VS:  BP 128/64   Pulse 94   Ht 5' 3 (1.6 m)   Wt 161 lb 6.4 oz (73.2 kg)   SpO2 96%   BMI 28.59 kg/m  , BMI Body mass index is 28.59 kg/m. Last weight:  Wt Readings from Last 3 Encounters:  04/30/24 161 lb 6.4 oz (73.2 kg)  03/30/24 156 lb 12.8 oz (71.1 kg)  01/31/24 158 lb 9.6 oz (71.9 kg)     Physical Exam Constitutional:      Appearance: Normal appearance.  Cardiovascular:     Rate and Rhythm: Normal rate and regular rhythm.     Heart sounds: Normal heart sounds.  Pulmonary:     Effort: Pulmonary effort is normal.     Breath sounds: Normal breath sounds.  Musculoskeletal:     Right lower leg: No edema.     Left lower leg: No edema.  Neurological:     Mental Status: She is alert.       EKG:   Recent Labs: No results found for requested labs within last 365 days.    Lipid Panel    Component Value Date/Time   CHOL 250 (H) 01/11/2012 0723   TRIG 123 01/11/2012 0723   HDL 42 01/11/2012 0723   CHOLHDL 7 12/23/2009 1040   VLDL 25 01/11/2012 0723   LDLCALC 183 (H) 01/11/2012 0723   LDLDIRECT 101.1 12/23/2009 1040      Other studies Reviewed: Additional studies/ records that were reviewed today include:  Review of the above records demonstrates:      03/29/2020    4:35 PM  PAD Screen  Previous PAD dx? No  Previous surgical procedure? No  Pain with walking? No  Feet/toe relief with dangling? No  Painful, non-healing ulcers? No  Extremities discolored? No      ASSESSMENT AND PLAN:    ICD-10-CM   1. Coronary artery disease involving native coronary artery of native heart without angina pectoris  I25.10  Comprehensive metabolic panel    metoprolol  succinate (TOPROL  XL) 25 MG 24 hr tablet    2. Nonrheumatic mitral valve regurgitation  I34.0 Comprehensive  metabolic panel    metoprolol  succinate (TOPROL  XL) 25 MG 24 hr tablet    3. Nonrheumatic aortic valve insufficiency  I35.1 Comprehensive metabolic panel    metoprolol  succinate (TOPROL  XL) 25 MG 24 hr tablet    4. Paroxysmal A-fib (HCC)  I48.0 Comprehensive metabolic panel    metoprolol  succinate (TOPROL  XL) 25 MG 24 hr tablet    5. Sleep apnea, obstructive  G47.33 Comprehensive metabolic panel    metoprolol  succinate (TOPROL  XL) 25 MG 24 hr tablet    6. Stage 3b chronic kidney disease (HCC)  N18.32 Comprehensive metabolic panel    metoprolol  succinate (TOPROL  XL) 25 MG 24 hr tablet    7. SOB (shortness of breath)  R06.02 Comprehensive metabolic panel    metoprolol  succinate (TOPROL  XL) 25 MG 24 hr tablet    8. Mixed hyperlipidemia  E78.2 Comprehensive metabolic panel    metoprolol  succinate (TOPROL  XL) 25 MG 24 hr tablet    9. CHF (congestive heart failure), NYHA class I, acute on chronic, diastolic (HCC)  I50.33 Comprehensive metabolic panel    metoprolol  succinate (TOPROL  XL) 25 MG 24 hr tablet   Was started on aldactone , but breathing not much better.       Problem List Items Addressed This Visit       Cardiovascular and Mediastinum   Paroxysmal A-fib (HCC)   Relevant Medications   metoprolol  succinate (TOPROL  XL) 25 MG 24 hr tablet   Other Relevant Orders   Comprehensive metabolic panel     Respiratory   Sleep apnea, obstructive   Relevant Medications   metoprolol  succinate (TOPROL  XL) 25 MG 24 hr tablet   Other Relevant Orders   Comprehensive metabolic panel     Genitourinary   CKD (chronic kidney disease) stage 3, GFR 30-59 ml/min (HCC)   Relevant Medications   metoprolol  succinate (TOPROL  XL) 25 MG 24 hr tablet   Other Relevant Orders   Comprehensive metabolic panel     Other   Hyperlipidemia   Relevant Medications   metoprolol  succinate (TOPROL  XL) 25 MG 24 hr tablet   Other Relevant Orders   Comprehensive metabolic panel   Other Visit Diagnoses        Coronary artery disease involving native coronary artery of native heart without angina pectoris    -  Primary   Relevant Medications   metoprolol  succinate (TOPROL  XL) 25 MG 24 hr tablet   Other Relevant Orders   Comprehensive metabolic panel     Nonrheumatic mitral valve regurgitation       Relevant Medications   metoprolol  succinate (TOPROL  XL) 25 MG 24 hr tablet   Other Relevant Orders   Comprehensive metabolic panel     Nonrheumatic aortic valve insufficiency       Relevant Medications   metoprolol  succinate (TOPROL  XL) 25 MG 24 hr tablet   Other Relevant Orders   Comprehensive metabolic panel     SOB (shortness of breath)       Relevant Medications   metoprolol  succinate (TOPROL  XL) 25 MG 24 hr tablet   Other Relevant Orders   Comprehensive metabolic panel     CHF (congestive heart failure), NYHA class I, acute on chronic, diastolic (HCC)       Was started on aldactone , but breathing not much better.   Relevant Medications   metoprolol  succinate (  TOPROL  XL) 25 MG 24 hr tablet   Other Relevant Orders   Comprehensive metabolic panel          Disposition:   Return in about 4 weeks (around 05/28/2024).    Total time spent: 35 minutes  Signed,  Denyse Bathe, MD  04/30/2024 2:09 PM    [facility]

## 2024-05-28 ENCOUNTER — Ambulatory Visit (INDEPENDENT_AMBULATORY_CARE_PROVIDER_SITE_OTHER): Admitting: Cardiovascular Disease

## 2024-05-28 ENCOUNTER — Encounter: Payer: Self-pay | Admitting: Cardiovascular Disease

## 2024-05-28 VITALS — BP 100/60 | HR 77 | Ht 63.0 in | Wt 164.0 lb

## 2024-05-28 DIAGNOSIS — E782 Mixed hyperlipidemia: Secondary | ICD-10-CM | POA: Diagnosis not present

## 2024-05-28 DIAGNOSIS — I480 Paroxysmal atrial fibrillation: Secondary | ICD-10-CM

## 2024-05-28 DIAGNOSIS — N1832 Chronic kidney disease, stage 3b: Secondary | ICD-10-CM | POA: Diagnosis not present

## 2024-05-28 DIAGNOSIS — I351 Nonrheumatic aortic (valve) insufficiency: Secondary | ICD-10-CM | POA: Diagnosis not present

## 2024-05-28 DIAGNOSIS — I10 Essential (primary) hypertension: Secondary | ICD-10-CM | POA: Diagnosis not present

## 2024-05-28 DIAGNOSIS — G4733 Obstructive sleep apnea (adult) (pediatric): Secondary | ICD-10-CM | POA: Diagnosis not present

## 2024-05-28 DIAGNOSIS — I340 Nonrheumatic mitral (valve) insufficiency: Secondary | ICD-10-CM

## 2024-05-28 DIAGNOSIS — R0602 Shortness of breath: Secondary | ICD-10-CM

## 2024-05-28 DIAGNOSIS — I5033 Acute on chronic diastolic (congestive) heart failure: Secondary | ICD-10-CM

## 2024-05-28 DIAGNOSIS — I2510 Atherosclerotic heart disease of native coronary artery without angina pectoris: Secondary | ICD-10-CM | POA: Diagnosis not present

## 2024-05-28 NOTE — Progress Notes (Signed)
 "     Cardiology Office Note   Date:  05/28/2024   ID:  Isolde Skaff [place], [place] [birth year], MRN [id number]  PCP:  Sadie Manna, MD  Cardiologist:  Denyse Bathe, MD      History of Present Illness: Brenda Barber is a 69 y.o. female who presents for  Chief Complaint  Patient presents with   Follow-up    4 weeks follow up    SOB is still same, not getting worse.      Past Medical History:  Diagnosis Date   Abscess of right breast 05/2023   Allergy    Anemia    Aortic atherosclerosis    Asthma    Carotid artery disease    a.) carotid doppler 01/03/2017: 1-49% RICA   Chiari I malformation (HCC)    Chronic cerebral microvascular disease    COPD (chronic obstructive pulmonary disease) (HCC)    Coronary artery disease    DDD (degenerative disc disease), thoracolumbar    Depression    Diastolic dysfunction    a.) TTE 03/08/2020: EF 55-60%, mild LVH, G1DD, mod MAC, triv MR, AoV sclerosis without stenosis; b.) TTE 01/25/2023: EF 70.6%, mild LVH, G3DD, AoV calc/sclerosis without stenosis, triv AR/TR, mild MR   Dyspnea    Essential hypertension    GERD (gastroesophageal reflux disease)    Heart murmur    History of bilateral cataract extraction    History of left heart catheterization 11/18/2017   a.) LHC 11/18/2017: normal coronaries; no CAD   History of radiation therapy 2023   Hyperlipidemia    Hypoparathyroidism    a.) s/p throidectomy   Lacunar infarction (HCC) 03/21/2000   a.) LEFT lentiform nucleus extending into the LEFT caudate nucleus; no residual deficits   Malignant neoplasm of lower lobe of left lung (HCC) 02/2022   a.) stage I NSCLC in [facility]   Memory loss    a.) on acetylcholinesterase inhibitor (donepazil)   Obstructive sleep apnea on CPAP    On apixaban  therapy    On dronedarone  therapy    Osteopenia    Paroxysmal A-fib (HCC)    a.) CHA2DS2-VASc = 6 (age, sex, HTN, CVA x2, vascular disease history) as of 06/06/2023; b.) cardiac rate/rhythm maintained on  oral diltiazem  + dronaderone; chronically anticoagulated using apixaban    Personality disorder, depressive    Pneumonia    PONV (postoperative nausea and vomiting)    difficulty breathing during endoscopy, colonoscopy performed prior w/out problems-NAUSEA ONLY   Postoperative hypothyroidism    a.) s/p thyroidectomy   Pre-diabetes    Stage 3a chronic kidney disease (CKD) (HCC)    Stroke (HCC)    Thoracic kyphosis    Thyroid  cancer (HCC) 1999   a.) stage I follicular thyroid  cancer (T3N0Mx)     Past Surgical History:  Procedure Laterality Date   CATARACT EXTRACTION W/ INTRAOCULAR LENS  IMPLANT, BILATERAL Bilateral    COLONOSCOPY WITH ESOPHAGOGASTRODUODENOSCOPY (EGD)  01/05/2013   COLONOSCOPY WITH PROPOFOL  N/A 08/22/2023   Procedure: COLONOSCOPY WITH PROPOFOL ;  Surgeon: Onita Elspeth Sharper, DO;  Location: [facility] ENDOSCOPY;  Service: Gastroenterology;  Laterality: N/A;  DM   ELECTROMAGNETIC NAVIGATION BROCHOSCOPY Left 10/24/2018   Procedure: ELECTROMAGNETIC NAVIGATION BRONCHOSCOPY LEFT;  Surgeon: Tamea Dedra CROME, MD;  Location: [facility] ORS;  Service: Cardiopulmonary;  Laterality: Left;   ESOPHAGOGASTRODUODENOSCOPY (EGD) WITH PROPOFOL  N/A 08/22/2023   Procedure: ESOPHAGOGASTRODUODENOSCOPY (EGD) WITH PROPOFOL ;  Surgeon: Onita Elspeth Sharper, DO;  Location: [facility] ENDOSCOPY;  Service: Gastroenterology;  Laterality: N/A;   JOINT REPLACEMENT  LEFT HEART CATH AND CORONARY ANGIOGRAPHY Right 11/18/2017   Procedure: Left Heart Cath with possible coronary intervention;  Surgeon: Fernand Denyse LABOR, MD;  Location: [facility] INVASIVE CV LAB;  Service: Cardiovascular;  Laterality: Right;   TONSILLECTOMY     TOTAL HIP ARTHROPLASTY Right 06/26/2016   Procedure: TOTAL HIP ARTHROPLASTY ANTERIOR APPROACH;  Surgeon: Ozell Flake, MD;  Location: [facility] ORS;  Service: Orthopedics;  Laterality: Right;   TOTAL THYROIDECTOMY  2000   VIDEO BRONCHOSCOPY WITH ENDOBRONCHIAL NAVIGATION Left 04/01/2020   Procedure: VIDEO  BRONCHOSCOPY WITH ENDOBRONCHIAL NAVIGATION;  Surgeon: Tamea Dedra CROME, MD;  Location: [facility] ORS;  Service: Pulmonary;  Laterality: Left;   VIDEO BRONCHOSCOPY WITH ENDOBRONCHIAL ULTRASOUND N/A 01/12/2022   Procedure: VIDEO BRONCHOSCOPY WITH ENDOBRONCHIAL ULTRASOUND;  Surgeon: Parris Manna, MD;  Location: [facility] ORS;  Service: Thoracic;  Laterality: N/A;   WOUND DEBRIDEMENT Right 06/07/2023   Procedure: DEBRIDEMENT WOUND;  Surgeon: Tye Millet, DO;  Location: [facility] ORS;  Service: General;  Laterality: Right;     Current Outpatient Medications  Medication Sig Dispense Refill   acetaminophen  (TYLENOL ) 500 MG tablet Take 1,000 mg by mouth every 6 (six) hours as needed for mild pain.      atorvastatin  (LIPITOR) 80 MG tablet Take 80 mg by mouth at bedtime.      calcium  carbonate (OS-CAL - DOSED IN MG OF ELEMENTAL CALCIUM ) 1250 (500 Ca) MG tablet Take 1 tablet by mouth 2 (two) times daily with a meal.     cyanocobalamin (VITAMIN B12) 1000 MCG tablet Take 1,000 mcg by mouth daily.     dapagliflozin  propanediol (FARXIGA ) 10 MG TABS tablet TAKE 1 TABLET(10 MG) BY MOUTH DAILY BEFORE BREAKFAST 30 tablet 3   diltiazem  (DILT-XR) 120 MG 24 hr capsule Take 1 capsule (120 mg total) by mouth daily. 90 capsule 0   donepezil  (ARICEPT ) 10 MG tablet Take 10 mg by mouth at bedtime.     ELIQUIS  5 MG TABS tablet TAKE 1 TABLET(5 MG) BY MOUTH TWICE DAILY 180 tablet 0   fluticasone  (FLONASE ) 50 MCG/ACT nasal spray Place 2 sprays into both nostrils daily.     furosemide  (LASIX ) 20 MG tablet Take 1 tablet (20 mg total) by mouth daily. 30 tablet 11   gemfibrozil  (LOPID ) 600 MG tablet TAKE 1 TABLET BY MOUTH TWICE DAILY 30 MINUTES BEFORE BREAKFAST AND DINNER 180 tablet 0   hydrocortisone 2.5 % lotion Apply 1 application  topically daily.     iron  polysaccharides (NIFEREX) 150 MG capsule Take 150 mg by mouth daily.      isosorbide  mononitrate (IMDUR ) 30 MG 24 hr tablet TAKE 1 TABLET(30 MG) BY MOUTH DAILY 90 tablet 0    levalbuterol (XOPENEX) 0.63 MG/3ML nebulizer solution Take 0.63 mg by nebulization every 6 (six) hours as needed for wheezing or shortness of breath.     levothyroxine  (SYNTHROID ) 112 MCG tablet Take 112 mcg by mouth daily before breakfast.     metoprolol  succinate (TOPROL  XL) 25 MG 24 hr tablet Take 0.5 tablets (12.5 mg total) by mouth daily. 15 tablet 11   montelukast  (SINGULAIR ) 10 MG tablet Take 10 mg by mouth at bedtime.     MULTAQ  400 MG tablet TAKE 1 TABLET(400 MG) BY MOUTH TWICE DAILY 180 tablet 3   Multiple Vitamin (MULTIVITAMIN) tablet Take 1 tablet by mouth daily.     niacin  (VITAMIN B3) 500 MG tablet Take 500 mg by mouth at bedtime.     nitroGLYCERIN  (NITROSTAT ) 0.4 MG SL tablet Place 0.4 mg under the  tongue every 5 (five) minutes as needed for chest pain.      RESTASIS 0.05 % ophthalmic emulsion Place 1 drop into both eyes 2 (two) times daily.     spironolactone  (ALDACTONE ) 25 MG tablet Take 0.5 tablets (12.5 mg total) by mouth daily. 30 tablet 11   sucralfate  (CARAFATE ) 1 g tablet TAKE 1 TABLET BY MOUTH TWICE DAILY 1 HOUR BEFORE MEALS 180 tablet 0   TRELEGY ELLIPTA  100-62.5-25 MCG/ACT AEPB Inhale 1 puff into the lungs daily.     vitamin C  (ASCORBIC ACID ) 500 MG tablet Take 500 mg by mouth daily.     No current facility-administered medications for this visit.    Allergies:   Aspirin -dipyridamole  er, Guaifenesin , and Rosuvastatin    Social History:   reports that she has never smoked. She has never used smokeless tobacco. She reports that she does not drink alcohol  and does not use drugs.   Family History:  family history includes Breast cancer (age of onset: 46) in her sister; Diabetes in her father, paternal aunt, and paternal uncle.    ROS:     Review of Systems  Constitutional: Negative.   HENT: Negative.    Eyes: Negative.   Respiratory: Negative.    Gastrointestinal: Negative.   Genitourinary: Negative.   Musculoskeletal: Negative.   Skin: Negative.    Neurological: Negative.   Endo/Heme/Allergies: Negative.   Psychiatric/Behavioral: Negative.    All other systems reviewed and are negative.     All other systems are reviewed and negative.    PHYSICAL EXAM: VS:  BP 100/60   Pulse 77   Ht 5' 3 (1.6 m)   Wt 164 lb (74.4 kg)   SpO2 92%   BMI 29.05 kg/m  , BMI Body mass index is 29.05 kg/m. Last weight:  Wt Readings from Last 3 Encounters:  05/28/24 164 lb (74.4 kg)  04/30/24 161 lb 6.4 oz (73.2 kg)  03/30/24 156 lb 12.8 oz (71.1 kg)     Physical Exam Constitutional:      Appearance: Normal appearance.  Cardiovascular:     Rate and Rhythm: Normal rate and regular rhythm.     Heart sounds: Normal heart sounds.  Pulmonary:     Effort: Pulmonary effort is normal.     Breath sounds: Normal breath sounds.  Musculoskeletal:     Right lower leg: No edema.     Left lower leg: No edema.  Neurological:     Mental Status: She is alert.       EKG:   Recent Labs: No results found for requested labs within last 365 days.    Lipid Panel    Component Value Date/Time   CHOL 250 (H) 01/11/2012 0723   TRIG 123 01/11/2012 0723   HDL 42 01/11/2012 0723   CHOLHDL 7 12/23/2009 1040   VLDL 25 01/11/2012 0723   LDLCALC 183 (H) 01/11/2012 0723   LDLDIRECT 101.1 12/23/2009 1040      Other studies Reviewed: Additional studies/ records that were reviewed today include:  Review of the above records demonstrates:      03/29/2020    4:35 PM  PAD Screen  Previous PAD dx? No  Previous surgical procedure? No  Pain with walking? No  Feet/toe relief with dangling? No  Painful, non-healing ulcers? No  Extremities discolored? No      ASSESSMENT AND PLAN:    ICD-10-CM   1. CHF (congestive heart failure), NYHA class I, acute on chronic, diastolic (HCC)  I50.33  On GDMT for diastolic dysfunction.    2. Mixed hyperlipidemia  E78.2     3. SOB (shortness of breath)  R06.02     4. Stage 3b chronic kidney disease (HCC)   N18.32     5. Sleep apnea, obstructive  G47.33     6. Paroxysmal A-fib (HCC)  I48.0     7. Nonrheumatic aortic valve insufficiency  I35.1     8. Nonrheumatic mitral valve regurgitation  I34.0     9. Coronary artery disease involving native coronary artery of native heart without angina pectoris  I25.10     10. Primary hypertension  I10        Problem List Items Addressed This Visit       Cardiovascular and Mediastinum   Paroxysmal A-fib (HCC)   Hypertension     Respiratory   Sleep apnea, obstructive     Genitourinary   CKD (chronic kidney disease) stage 3, GFR 30-59 ml/min (HCC)     Other   Hyperlipidemia   Other Visit Diagnoses       CHF (congestive heart failure), NYHA class I, acute on chronic, diastolic (HCC)    -  Primary   On GDMT for diastolic dysfunction.     SOB (shortness of breath)         Nonrheumatic aortic valve insufficiency         Nonrheumatic mitral valve regurgitation         Coronary artery disease involving native coronary artery of native heart without angina pectoris              Disposition:   Return in about 3 months (around 08/26/2024).    Total time spent: 35 minutes  Signed,  Denyse Bathe, MD  05/28/2024 1:38 PM    [facility] "

## 2024-09-03 ENCOUNTER — Ambulatory Visit

## 2024-09-03 ENCOUNTER — Ambulatory Visit: Admitting: Cardiovascular Disease

## 2024-09-17 ENCOUNTER — Ambulatory Visit: Admitting: Radiation Oncology
